# Patient Record
Sex: Female | Born: 1935 | Race: White | Hispanic: No | Smoking: Never smoker
Health system: Southern US, Community
[De-identification: ages and names within clinical notes are randomized; demographics above are authoritative.]

## PROBLEM LIST (undated history)

## (undated) DIAGNOSIS — E782 Mixed hyperlipidemia: Secondary | ICD-10-CM

## (undated) DIAGNOSIS — S069XAA Unspecified intracranial injury with loss of consciousness status unknown, initial encounter: Secondary | ICD-10-CM

## (undated) DIAGNOSIS — F03C0 Unspecified dementia, severe, without behavioral disturbance, psychotic disturbance, mood disturbance, and anxiety: Secondary | ICD-10-CM

## (undated) DIAGNOSIS — M549 Dorsalgia, unspecified: Secondary | ICD-10-CM

## (undated) DIAGNOSIS — Z960 Presence of urogenital implants: Secondary | ICD-10-CM

## (undated) DIAGNOSIS — M797 Fibromyalgia: Secondary | ICD-10-CM

## (undated) DIAGNOSIS — K91840 Postprocedural hemorrhage and hematoma of a digestive system organ or structure following a digestive system procedure: Secondary | ICD-10-CM

## (undated) DIAGNOSIS — I10 Essential (primary) hypertension: Secondary | ICD-10-CM

## (undated) DIAGNOSIS — N200 Calculus of kidney: Secondary | ICD-10-CM

## (undated) DIAGNOSIS — I4821 Permanent atrial fibrillation: Secondary | ICD-10-CM

## (undated) DIAGNOSIS — J45909 Unspecified asthma, uncomplicated: Secondary | ICD-10-CM

## (undated) DIAGNOSIS — G8929 Other chronic pain: Secondary | ICD-10-CM

## (undated) DIAGNOSIS — K449 Diaphragmatic hernia without obstruction or gangrene: Secondary | ICD-10-CM

## (undated) DIAGNOSIS — R197 Diarrhea, unspecified: Secondary | ICD-10-CM

## (undated) DIAGNOSIS — G4733 Obstructive sleep apnea (adult) (pediatric): Secondary | ICD-10-CM

## (undated) DIAGNOSIS — K219 Gastro-esophageal reflux disease without esophagitis: Secondary | ICD-10-CM

## (undated) DIAGNOSIS — E119 Type 2 diabetes mellitus without complications: Secondary | ICD-10-CM

## (undated) DIAGNOSIS — F419 Anxiety disorder, unspecified: Secondary | ICD-10-CM

## (undated) DIAGNOSIS — K439 Ventral hernia without obstruction or gangrene: Secondary | ICD-10-CM

## (undated) HISTORY — DX: Anxiety disorder, unspecified: F41.9

## (undated) HISTORY — DX: Gastro-esophageal reflux disease without esophagitis: K21.9

## (undated) HISTORY — PX: COLECTOMY: SHX59

## (undated) HISTORY — DX: Type 2 diabetes mellitus without complications: E11.9

## (undated) HISTORY — DX: Other chronic pain: G89.29

## (undated) HISTORY — DX: Diaphragmatic hernia without obstruction or gangrene: K44.9

## (undated) HISTORY — PX: TOTAL ABDOMINAL HYSTERECTOMY: SHX209

## (undated) HISTORY — DX: Presence of urogenital implants: Z96.0

## (undated) HISTORY — DX: Dorsalgia, unspecified: M54.9

## (undated) HISTORY — DX: Essential (primary) hypertension: I10

## (undated) HISTORY — DX: Mixed hyperlipidemia: E78.2

## (undated) HISTORY — DX: Fibromyalgia: M79.7

## (undated) HISTORY — DX: Diarrhea, unspecified: R19.7

## (undated) HISTORY — DX: Calculus of kidney: N20.0

## (undated) HISTORY — PX: TONSILLECTOMY: SUR1361

## (undated) HISTORY — DX: Obstructive sleep apnea (adult) (pediatric): G47.33

## (undated) HISTORY — DX: Postprocedural hemorrhage of a digestive system organ or structure following a digestive system procedure: K91.840

## (undated) HISTORY — DX: Permanent atrial fibrillation: I48.21

## (undated) HISTORY — PX: BREAST LUMPECTOMY: SHX2

---

## 2001-12-25 ENCOUNTER — Inpatient Hospital Stay (HOSPITAL_COMMUNITY): Admission: EM | Admit: 2001-12-25 | Discharge: 2001-12-30 | Payer: Self-pay | Admitting: Cardiology

## 2001-12-30 ENCOUNTER — Encounter: Payer: Self-pay | Admitting: Cardiology

## 2003-09-30 ENCOUNTER — Ambulatory Visit (HOSPITAL_COMMUNITY): Admission: RE | Admit: 2003-09-30 | Discharge: 2003-09-30 | Payer: Self-pay | Admitting: Internal Medicine

## 2003-10-12 ENCOUNTER — Inpatient Hospital Stay (HOSPITAL_COMMUNITY): Admission: RE | Admit: 2003-10-12 | Discharge: 2003-10-17 | Payer: Self-pay | Admitting: Internal Medicine

## 2004-03-08 ENCOUNTER — Ambulatory Visit (HOSPITAL_COMMUNITY): Admission: RE | Admit: 2004-03-08 | Discharge: 2004-03-08 | Payer: Self-pay | Admitting: Internal Medicine

## 2004-04-06 ENCOUNTER — Ambulatory Visit (HOSPITAL_COMMUNITY): Admission: RE | Admit: 2004-04-06 | Discharge: 2004-04-06 | Payer: Self-pay | Admitting: General Surgery

## 2004-06-01 ENCOUNTER — Ambulatory Visit: Payer: Self-pay | Admitting: *Deleted

## 2004-06-01 ENCOUNTER — Inpatient Hospital Stay (HOSPITAL_COMMUNITY): Admission: EM | Admit: 2004-06-01 | Discharge: 2004-06-04 | Payer: Self-pay | Admitting: Internal Medicine

## 2004-06-02 ENCOUNTER — Ambulatory Visit: Payer: Self-pay | Admitting: Cardiology

## 2004-06-03 ENCOUNTER — Encounter: Payer: Self-pay | Admitting: Internal Medicine

## 2004-06-18 ENCOUNTER — Ambulatory Visit: Payer: Self-pay | Admitting: *Deleted

## 2004-06-20 ENCOUNTER — Ambulatory Visit (HOSPITAL_COMMUNITY): Admission: RE | Admit: 2004-06-20 | Discharge: 2004-06-21 | Payer: Self-pay | Admitting: *Deleted

## 2004-06-20 ENCOUNTER — Ambulatory Visit: Payer: Self-pay | Admitting: Cardiovascular Disease

## 2004-06-22 ENCOUNTER — Ambulatory Visit: Payer: Self-pay | Admitting: Cardiology

## 2004-06-28 ENCOUNTER — Ambulatory Visit: Payer: Self-pay | Admitting: *Deleted

## 2004-12-26 ENCOUNTER — Ambulatory Visit (HOSPITAL_COMMUNITY): Admission: RE | Admit: 2004-12-26 | Discharge: 2004-12-26 | Payer: Self-pay | Admitting: Family Medicine

## 2005-04-19 ENCOUNTER — Ambulatory Visit (HOSPITAL_COMMUNITY): Admission: RE | Admit: 2005-04-19 | Discharge: 2005-04-19 | Payer: Self-pay | Admitting: Family Medicine

## 2005-10-08 ENCOUNTER — Ambulatory Visit (HOSPITAL_COMMUNITY): Admission: RE | Admit: 2005-10-08 | Discharge: 2005-10-08 | Payer: Self-pay | Admitting: Family Medicine

## 2005-12-16 ENCOUNTER — Ambulatory Visit (HOSPITAL_COMMUNITY): Admission: RE | Admit: 2005-12-16 | Discharge: 2005-12-16 | Payer: Self-pay | Admitting: Rheumatology

## 2005-12-29 ENCOUNTER — Encounter: Admission: RE | Admit: 2005-12-29 | Discharge: 2005-12-29 | Payer: Self-pay | Admitting: Rheumatology

## 2006-02-10 ENCOUNTER — Ambulatory Visit (HOSPITAL_COMMUNITY): Admission: RE | Admit: 2006-02-10 | Discharge: 2006-02-10 | Payer: Self-pay | Admitting: Family Medicine

## 2006-08-18 ENCOUNTER — Ambulatory Visit: Payer: Self-pay | Admitting: Cardiology

## 2006-09-29 ENCOUNTER — Ambulatory Visit: Payer: Self-pay | Admitting: Physician Assistant

## 2006-10-26 ENCOUNTER — Ambulatory Visit: Payer: Self-pay | Admitting: Cardiology

## 2006-10-27 ENCOUNTER — Encounter: Payer: Self-pay | Admitting: Cardiology

## 2006-11-14 ENCOUNTER — Ambulatory Visit: Payer: Self-pay | Admitting: Cardiology

## 2007-03-31 ENCOUNTER — Inpatient Hospital Stay (HOSPITAL_COMMUNITY): Admission: AD | Admit: 2007-03-31 | Discharge: 2007-04-01 | Payer: Self-pay | Admitting: Internal Medicine

## 2007-03-31 ENCOUNTER — Ambulatory Visit: Payer: Self-pay | Admitting: Internal Medicine

## 2007-04-12 ENCOUNTER — Encounter: Payer: Self-pay | Admitting: Cardiology

## 2007-04-20 ENCOUNTER — Ambulatory Visit: Payer: Self-pay | Admitting: Cardiology

## 2007-04-23 ENCOUNTER — Encounter: Payer: Self-pay | Admitting: Cardiology

## 2007-04-23 ENCOUNTER — Ambulatory Visit (HOSPITAL_COMMUNITY): Admission: RE | Admit: 2007-04-23 | Discharge: 2007-04-23 | Payer: Self-pay | Admitting: Family Medicine

## 2007-08-06 ENCOUNTER — Ambulatory Visit (HOSPITAL_COMMUNITY): Admission: RE | Admit: 2007-08-06 | Discharge: 2007-08-06 | Payer: Self-pay | Admitting: Family Medicine

## 2007-11-09 ENCOUNTER — Ambulatory Visit: Admission: RE | Admit: 2007-11-09 | Discharge: 2007-11-09 | Payer: Self-pay | Admitting: Neurology

## 2007-11-10 ENCOUNTER — Emergency Department (HOSPITAL_COMMUNITY): Admission: EM | Admit: 2007-11-10 | Discharge: 2007-11-10 | Payer: Self-pay | Admitting: Emergency Medicine

## 2008-04-26 ENCOUNTER — Encounter: Payer: Self-pay | Admitting: Cardiology

## 2008-06-30 ENCOUNTER — Encounter: Payer: Self-pay | Admitting: Cardiology

## 2008-06-30 ENCOUNTER — Ambulatory Visit: Payer: Self-pay | Admitting: Cardiology

## 2008-09-22 ENCOUNTER — Encounter: Admission: RE | Admit: 2008-09-22 | Discharge: 2008-09-22 | Payer: Self-pay | Admitting: Family Medicine

## 2008-12-21 DIAGNOSIS — E782 Mixed hyperlipidemia: Secondary | ICD-10-CM | POA: Insufficient documentation

## 2008-12-21 DIAGNOSIS — R0602 Shortness of breath: Secondary | ICD-10-CM | POA: Insufficient documentation

## 2008-12-21 DIAGNOSIS — R002 Palpitations: Secondary | ICD-10-CM | POA: Insufficient documentation

## 2008-12-21 DIAGNOSIS — I10 Essential (primary) hypertension: Secondary | ICD-10-CM | POA: Insufficient documentation

## 2008-12-21 DIAGNOSIS — R079 Chest pain, unspecified: Secondary | ICD-10-CM | POA: Insufficient documentation

## 2008-12-26 ENCOUNTER — Telehealth (INDEPENDENT_AMBULATORY_CARE_PROVIDER_SITE_OTHER): Payer: Self-pay | Admitting: *Deleted

## 2009-02-16 DIAGNOSIS — Z8719 Personal history of other diseases of the digestive system: Secondary | ICD-10-CM | POA: Insufficient documentation

## 2009-02-17 ENCOUNTER — Ambulatory Visit: Payer: Self-pay | Admitting: Internal Medicine

## 2009-02-17 DIAGNOSIS — K219 Gastro-esophageal reflux disease without esophagitis: Secondary | ICD-10-CM | POA: Insufficient documentation

## 2009-02-17 DIAGNOSIS — E1122 Type 2 diabetes mellitus with diabetic chronic kidney disease: Secondary | ICD-10-CM | POA: Insufficient documentation

## 2009-02-17 DIAGNOSIS — N200 Calculus of kidney: Secondary | ICD-10-CM | POA: Insufficient documentation

## 2009-02-17 DIAGNOSIS — IMO0001 Reserved for inherently not codable concepts without codable children: Secondary | ICD-10-CM | POA: Insufficient documentation

## 2009-02-17 DIAGNOSIS — M549 Dorsalgia, unspecified: Secondary | ICD-10-CM | POA: Insufficient documentation

## 2009-02-17 DIAGNOSIS — G4730 Sleep apnea, unspecified: Secondary | ICD-10-CM | POA: Insufficient documentation

## 2009-02-17 DIAGNOSIS — F411 Generalized anxiety disorder: Secondary | ICD-10-CM | POA: Insufficient documentation

## 2009-02-17 DIAGNOSIS — N183 Chronic kidney disease, stage 3 (moderate): Secondary | ICD-10-CM

## 2009-02-17 DIAGNOSIS — F419 Anxiety disorder, unspecified: Secondary | ICD-10-CM | POA: Insufficient documentation

## 2009-02-17 DIAGNOSIS — R197 Diarrhea, unspecified: Secondary | ICD-10-CM | POA: Insufficient documentation

## 2009-02-22 HISTORY — PX: COLONOSCOPY: SHX174

## 2009-02-22 HISTORY — PX: ESOPHAGOGASTRODUODENOSCOPY: SHX1529

## 2009-02-23 ENCOUNTER — Ambulatory Visit: Payer: Self-pay | Admitting: Internal Medicine

## 2009-02-23 ENCOUNTER — Ambulatory Visit (HOSPITAL_COMMUNITY): Admission: RE | Admit: 2009-02-23 | Discharge: 2009-02-23 | Payer: Self-pay | Admitting: Internal Medicine

## 2009-02-27 ENCOUNTER — Encounter: Payer: Self-pay | Admitting: Internal Medicine

## 2009-04-14 ENCOUNTER — Ambulatory Visit: Payer: Self-pay | Admitting: Gastroenterology

## 2009-04-14 DIAGNOSIS — K589 Irritable bowel syndrome without diarrhea: Secondary | ICD-10-CM | POA: Insufficient documentation

## 2009-04-14 DIAGNOSIS — R1084 Generalized abdominal pain: Secondary | ICD-10-CM | POA: Insufficient documentation

## 2009-04-28 ENCOUNTER — Telehealth (INDEPENDENT_AMBULATORY_CARE_PROVIDER_SITE_OTHER): Payer: Self-pay | Admitting: *Deleted

## 2009-05-17 ENCOUNTER — Ambulatory Visit: Payer: Self-pay | Admitting: Internal Medicine

## 2009-06-05 ENCOUNTER — Ambulatory Visit (HOSPITAL_COMMUNITY): Admission: RE | Admit: 2009-06-05 | Discharge: 2009-06-05 | Payer: Self-pay | Admitting: Family Medicine

## 2009-06-28 ENCOUNTER — Telehealth (INDEPENDENT_AMBULATORY_CARE_PROVIDER_SITE_OTHER): Payer: Self-pay | Admitting: *Deleted

## 2009-06-28 ENCOUNTER — Encounter: Payer: Self-pay | Admitting: Cardiology

## 2009-07-11 ENCOUNTER — Ambulatory Visit: Payer: Self-pay | Admitting: Cardiology

## 2009-07-11 ENCOUNTER — Inpatient Hospital Stay (HOSPITAL_COMMUNITY): Admission: EM | Admit: 2009-07-11 | Discharge: 2009-07-13 | Payer: Self-pay | Admitting: Emergency Medicine

## 2009-07-11 ENCOUNTER — Encounter: Payer: Self-pay | Admitting: Cardiology

## 2009-07-12 ENCOUNTER — Encounter: Payer: Self-pay | Admitting: Cardiology

## 2009-07-12 ENCOUNTER — Ambulatory Visit: Payer: Self-pay | Admitting: Cardiology

## 2009-07-13 ENCOUNTER — Encounter: Payer: Self-pay | Admitting: Cardiology

## 2009-08-04 ENCOUNTER — Ambulatory Visit: Payer: Self-pay | Admitting: Cardiology

## 2009-08-04 DIAGNOSIS — N183 Chronic kidney disease, stage 3 unspecified: Secondary | ICD-10-CM | POA: Insufficient documentation

## 2009-08-04 DIAGNOSIS — I4821 Permanent atrial fibrillation: Secondary | ICD-10-CM | POA: Insufficient documentation

## 2009-08-04 DIAGNOSIS — I2510 Atherosclerotic heart disease of native coronary artery without angina pectoris: Secondary | ICD-10-CM | POA: Insufficient documentation

## 2009-08-04 DIAGNOSIS — E119 Type 2 diabetes mellitus without complications: Secondary | ICD-10-CM | POA: Insufficient documentation

## 2009-08-04 DIAGNOSIS — N1330 Unspecified hydronephrosis: Secondary | ICD-10-CM | POA: Insufficient documentation

## 2009-08-04 DIAGNOSIS — D508 Other iron deficiency anemias: Secondary | ICD-10-CM | POA: Insufficient documentation

## 2009-08-11 ENCOUNTER — Ambulatory Visit: Payer: Self-pay | Admitting: Cardiology

## 2009-08-14 ENCOUNTER — Encounter: Payer: Self-pay | Admitting: Cardiology

## 2009-08-24 ENCOUNTER — Encounter (INDEPENDENT_AMBULATORY_CARE_PROVIDER_SITE_OTHER): Payer: Self-pay | Admitting: *Deleted

## 2009-08-29 ENCOUNTER — Ambulatory Visit (HOSPITAL_COMMUNITY): Admission: RE | Admit: 2009-08-29 | Discharge: 2009-08-29 | Payer: Self-pay | Admitting: Urology

## 2009-09-07 ENCOUNTER — Encounter (INDEPENDENT_AMBULATORY_CARE_PROVIDER_SITE_OTHER): Payer: Self-pay | Admitting: *Deleted

## 2009-09-11 ENCOUNTER — Encounter (HOSPITAL_COMMUNITY): Admission: RE | Admit: 2009-09-11 | Discharge: 2009-10-11 | Payer: Self-pay | Admitting: Urology

## 2009-09-11 ENCOUNTER — Encounter (INDEPENDENT_AMBULATORY_CARE_PROVIDER_SITE_OTHER): Payer: Self-pay | Admitting: *Deleted

## 2009-09-21 ENCOUNTER — Encounter (INDEPENDENT_AMBULATORY_CARE_PROVIDER_SITE_OTHER): Payer: Self-pay | Admitting: *Deleted

## 2009-11-09 ENCOUNTER — Ambulatory Visit: Payer: Self-pay | Admitting: Cardiology

## 2009-11-16 ENCOUNTER — Encounter: Payer: Self-pay | Admitting: Cardiology

## 2009-11-16 ENCOUNTER — Ambulatory Visit: Payer: Self-pay | Admitting: Cardiology

## 2009-11-22 ENCOUNTER — Encounter: Payer: Self-pay | Admitting: Cardiology

## 2009-11-22 DIAGNOSIS — R9389 Abnormal findings on diagnostic imaging of other specified body structures: Secondary | ICD-10-CM | POA: Insufficient documentation

## 2009-11-24 ENCOUNTER — Encounter: Payer: Self-pay | Admitting: Cardiology

## 2009-11-27 ENCOUNTER — Ambulatory Visit: Payer: Self-pay | Admitting: Cardiology

## 2010-01-12 ENCOUNTER — Ambulatory Visit: Payer: Self-pay | Admitting: Cardiology

## 2010-01-12 DIAGNOSIS — R310 Gross hematuria: Secondary | ICD-10-CM | POA: Insufficient documentation

## 2010-01-23 ENCOUNTER — Encounter: Payer: Self-pay | Admitting: Cardiology

## 2010-01-23 DIAGNOSIS — E875 Hyperkalemia: Secondary | ICD-10-CM | POA: Insufficient documentation

## 2010-01-29 ENCOUNTER — Encounter: Payer: Self-pay | Admitting: Cardiology

## 2010-02-07 ENCOUNTER — Encounter (INDEPENDENT_AMBULATORY_CARE_PROVIDER_SITE_OTHER): Payer: Self-pay | Admitting: *Deleted

## 2010-02-14 ENCOUNTER — Ambulatory Visit: Payer: Self-pay | Admitting: Cardiology

## 2010-02-20 ENCOUNTER — Encounter: Payer: Self-pay | Admitting: Cardiology

## 2010-03-20 ENCOUNTER — Ambulatory Visit: Payer: Self-pay | Admitting: Cardiology

## 2010-03-27 ENCOUNTER — Ambulatory Visit: Payer: Self-pay | Admitting: Cardiology

## 2010-04-03 ENCOUNTER — Ambulatory Visit: Payer: Self-pay | Admitting: Cardiology

## 2010-04-03 LAB — CONVERTED CEMR LAB: POC INR: 3.9

## 2010-04-13 ENCOUNTER — Ambulatory Visit: Admission: RE | Admit: 2010-04-13 | Discharge: 2010-04-13 | Payer: Self-pay | Source: Home / Self Care

## 2010-04-13 LAB — CONVERTED CEMR LAB: POC INR: 2.3

## 2010-04-19 ENCOUNTER — Ambulatory Visit: Admission: RE | Admit: 2010-04-19 | Discharge: 2010-04-19 | Payer: Self-pay | Source: Home / Self Care

## 2010-04-19 LAB — CONVERTED CEMR LAB: POC INR: 2.7

## 2010-04-26 ENCOUNTER — Encounter: Payer: Self-pay | Admitting: Cardiology

## 2010-04-26 ENCOUNTER — Encounter (INDEPENDENT_AMBULATORY_CARE_PROVIDER_SITE_OTHER): Payer: Self-pay | Admitting: *Deleted

## 2010-04-27 ENCOUNTER — Ambulatory Visit: Admission: RE | Admit: 2010-04-27 | Discharge: 2010-04-27 | Payer: Self-pay | Source: Home / Self Care

## 2010-04-27 LAB — CONVERTED CEMR LAB: POC INR: 3.7

## 2010-04-29 ENCOUNTER — Encounter: Payer: Self-pay | Admitting: Rheumatology

## 2010-04-29 ENCOUNTER — Encounter: Payer: Self-pay | Admitting: Family Medicine

## 2010-04-30 ENCOUNTER — Encounter: Payer: Self-pay | Admitting: Family Medicine

## 2010-05-04 ENCOUNTER — Encounter: Payer: Self-pay | Admitting: Cardiology

## 2010-05-04 ENCOUNTER — Telehealth (INDEPENDENT_AMBULATORY_CARE_PROVIDER_SITE_OTHER): Payer: Self-pay | Admitting: *Deleted

## 2010-05-08 ENCOUNTER — Ambulatory Visit: Admission: RE | Admit: 2010-05-08 | Discharge: 2010-05-08 | Payer: Self-pay | Source: Home / Self Care

## 2010-05-08 LAB — CONVERTED CEMR LAB: POC INR: 1.8

## 2010-05-08 NOTE — Assessment & Plan Note (Signed)
Summary: 3 mo fu per july reminder-srs   Visit Type:  Follow-up Referring Clairessa Boulet:  Dr Milinda Cave Primary Rece Zechman:  Dr. Earley Favor   History of Present Illness: the patient is a 76 year old female with history of substernal chest pain and palpitations.the patient is in persistent atrial fibrillation with symptomatic palpitations. Several months ago she was hospitalized with chest pain and ruled out for microinfarction. She has known obstructive coronary artery disease by cardiac catheterization in 2006. She had a negative Myoview in 2008. The patient has microcytic anemia, stage III kidney disease and right hydronephrosis. She scheduled for surgery on August 19 at [facility].  The patient states it still is occasional skipped beats. When this occurs she feels very weak. She has decreased exercise tolerance but is grossly overweight and deconditioned. She states that her legs feel like butter. She denies difficulty walking the mailbox which gives her out completely. She denies however exertional chest pain. She reports lower extremity edema. She typically has palpitations at 4:00 in the morning at which point she takes an additional half of the metoprolol which improves her symptoms.  Preventive Screening-Counseling & Management  Alcohol-Tobacco     Smoking Status: never  Current Medications (verified): 1)  Catapres-Tts-3 0.3 Mg/24hr Ptwk (Clonidine Hcl) .... Aone Patch Weekly 2)  Nexium 40 Mg Cpdr (Esomeprazole Magnesium) .... Take 1 Tablet By Mouth Two Times A Day 3)  Ramipril 10 Mg Caps (Ramipril) .... Take 1 Tablet By Mouth Once Daily 4)  Norvasc 10 Mg Tabs (Amlodipine Besylate) .... Take 1 Tablet By Mouth Once A Day 5)  Metoprolol Tartrate 50 Mg Tabs (Metoprolol Tartrate) .... Take 1 Tablet By Mouth Two Times A Day, (May Take Up To 1 Extra Tablet Two Times A Day As Needed) 6)  Crestor 10 Mg Tabs (Rosuvastatin Calcium) .... Take 1 Tablet By Mouth Once A Day 7)  Hydrochlorothiazide 50 Mg  Tabs (Hydrochlorothiazide) .... Take 1 Tablet By Mouth Once A Day 8)  Allegra 180 Mg Tabs (Fexofenadine Hcl) .... Take 1 Tablet By Mouth Once A Day As Needed 9)  Humalog 100 Unit/ml Soln (Insulin Lispro (Human)) .... Inject Subcutaneously As Directed 10)  Lantus 100 Unit/ml Soln (Insulin Glargine) .... Inject 60 Units Two Times A Day Subcutaneously 11)  Oxycodone Hcl 15 Mg Tabs (Oxycodone Hcl) .... Take 1 Tablet By Mouth Four Times A Day 12)  Xanax 1 Mg Tabs (Alprazolam) .... Take 1 Tablet By Mouth Three Times A Day 13)  Tylenol .... As Needed 14)  Ambien 10 Mg Tabs (Zolpidem Tartrate) .... Take 1 Tablet By Mouth At Bedtime As Needed Sleep 15)  Multivitamins  Tabs (Multiple Vitamin) .... Take 1 Tablet By Mouth Once A Day 16)  Warfarin Sodium 5 Mg Tabs (Warfarin Sodium) .... Use As Directed By Dr. Milinda Cave 17)  Zanaflex 2 Mg Caps (Tizanidine Hcl) .... Take 1 Capsule By Mouth Three Times A Day 18)  Lasix 40 Mg Tabs (Furosemide) .... Take 1 Tablet By Mouth Once A Day As Needed Extreme Swelling of Legs 19)  Metoprolol Tartrate 25 Mg Tabs (Metoprolol Tartrate) .... May Use Up To Two Times A Day As Needed Palpitations 20)  Premarin 0.625 Mg/gm Crea (Estrogens, Conjugated) .... Use As Directed 2 Grams Vaginally Each Day 21)  Tobrasol 0.3 % Soln (Tobramycin Sulfate) .... Instill One Drop Three Times A Day in (L) Eye 22)  Dulcolax 5 Mg Tbec (Bisacodyl) .... As Needed 23)  Lomotil 2.5-0.025 Mg Tabs (Diphenoxylate-Atropine) .... As Needed  Allergies: 1)  !  Pcn 2)  ! * Vesteril 3)  ! * Ivp Dye  Comments:  Nurse/Medical Assistant: The patient's medications were reviewed with the patient and were updated in the Medication List. Pt brought medication bottles to office visit.  Cyril Loosen, RN, BSN (November 09, 2009 11:12 AM)  Past History:  Past Medical History: Last updated: 04/14/2009 WEIGHT LOSS (ICD-783.21) DIARRHEA, CHRONIC (ICD-787.91) GERD (ICD-530.81) IDDM (ICD-250.01) SLEEP APNEA  (ICD-780.57) FIBROMYALGIA (ICD-729.1) NEPHROLITHIASIS (ICD-592.0) seeing Dr Jerre Simon ANXIETY (ICD-300.00) DIVERTICULITIS, HX OF (ICD-V12.79) PALPITATIONS (ICD-785.1) HYPERLIPIDEMIA-MIXED (ICD-272.4) HYPERTENSION, UNSPECIFIED (ICD-401.9) CHEST PAIN-UNSPECIFIED (ICD-786.50) DYSPNEA (ICD-786.05) Hx incomplete colonoscopy by Dr. Jena Gauss 06/11/1999 due to fixed non-compliant colon precluded exam to 40 cm, she had subsequent colectomy for diverticulitis since that time CHRONIC BACK PAIN  Recent EGD/TCS 11/10 showed small hiatal hernia,  normal-appearing tubular esophagus status post passage of a 54-French Maloney dilator, status post biopsy esophageal mucosa, multiple fundal gland type gastric polyps.  One large pedunculated polyp with oozing noted status post clipping and snare polypectomy.  Biopsies of the gastric mucosa taken given the her symptoms in her elevated eosinophil count.  Normal D1-D3, status post biopsy D2-D3.  All bx unremarkable.  On TCS she had left-sided diverticula, evidence of prior segmental resection with anastomosis, multiple colonic polyps status post multiple snare polypectomies, status post segmental biopsy and stool sampling.  She had multiple tubular adenomas.  No microscopic/collagenous colitis.  Stool culture, C Diff, O+P, lactoferrin were negative.  Past Surgical History: Last updated: 02/17/2009 Abdominal Hysterectomy-Total Tonsillectomy Colectomy 2002 Dr. Salvadore Dom for diverticulitis  Family History: Last updated: 02/17/2009 Family History of Coronary Artery Disease:  No known family history of colorectal carcinoma, IBD, or liver problems. Father: (deceased 69) DM, MI, CAD, HTN Mother: (deceased 4)   DM, MI, CAD Siblings: 7 (1/2 siblings) living CAD, cerebral aneurysm, HTN 1 son chronic pancreatitis, depression  Social History: Last updated: 02/17/2009 Retired, Contractor Widowed, lives w/ 2 sons 4 living children, lost 1 son age 58 MVA Tobacco Use - No.   Alcohol Use - no Regular Exercise - no Drug Use - no  Risk Factors: Smoking Status: never (11/09/2009)  Review of Systems       The patient complains of fatigue, weight gain/loss, palpitations, shortness of breath, sleep apnea, and leg swelling.  The patient denies malaise, fever, vision loss, decreased hearing, hoarseness, chest pain, prolonged cough, wheezing, coughing up blood, abdominal pain, blood in stool, nausea, vomiting, diarrhea, heartburn, incontinence, blood in urine, muscle weakness, joint pain, rash, skin lesions, headache, fainting, dizziness, depression, anxiety, enlarged lymph nodes, easy bruising or bleeding, and environmental allergies.    Vital Signs:  Patient profile:   76 year old female Height:      63 inches Weight:      238 pounds BMI:     42.31 Pulse rate:   66 / minute BP sitting:   103 / 65  (left arm) Cuff size:   large  Vitals Entered By: Cyril Loosen, RN, BSN (November 09, 2009 11:00 AM)  Nutrition Counseling: Patient's BMI is greater than 25 and therefore counseled on weight management options. Comments Follow up visit   Physical Exam  Additional Exam:  General: Well-developed, well-nourished in no distress head: Normocephalic and atraumatic eyes PERRLA/EOMI intact, conjunctiva and lids normal nose: No deformity or lesions mouth normal dentition, normal posterior pharynx neck: Supple, no JVD.  No masses, thyromegaly or abnormal cervical nodes lungs: Normal breath sounds bilaterally without wheezing.  Normal percussion heart: regular rate and rhythm  with normal S1 and S2, no S3 or S4.  PMI is normal.  No pathological murmurs abdomen: Normal bowel sounds, abdomen is soft and nontender without masses, organomegaly or hernias noted.  No hepatosplenomegaly musculoskeletal: Back normal, normal gait muscle strength and tone normal pulsus: Pulse is normal in all 4 extremities Extremities: 2+ peripheral pitting edema in neurologic: Alert and oriented  x 3 skin: Intact without lesions or rashes cervical nodes: No significant adenopathy psychologic: Normal affect    Impression & Recommendations:  Problem # 1:  ATRIAL FIBRILLATION (ICD-427.31) the patient could benefit from restoration of normal sinus rhythm. However she will need an echocardiogram first. Also I would have the patient proceed with elective renal surgery prior to making a decision for cardioversion. I will let the patient come back in September after her surgery at that point in time after review of the echo we'll make a decision if she could benefit from cardioversion. Her updated medication list for this problem includes:    Metoprolol Tartrate 50 Mg Tabs (Metoprolol tartrate) .[name]... Take 1 tablet by mouth two times a day, (may take up to 1 extra tablet two times a day as needed)    Warfarin Sodium 5 Mg Tabs (Warfarin sodium) ..... Use as directed by dr. Milinda Cave    Metoprolol Tartrate 25 Mg Tabs (Metoprolol tartrate) ..... May use up to two times a day as needed palpitations  Orders: EKG w/ Interpretation (93000) 2-D Echocardiogram (2D Echo)  Problem # 2:  RENAL DISEASE, CHRONIC, STAGE III (ICD-585.3) Assessment: Comment Only  Problem # 3:  DM (ICD-250.00) Assessment: Comment Only  Her updated medication list for this problem includes:    Ramipril 10 Mg Caps (Ramipril) .[name]... Take 1 tablet by mouth once daily    Humalog 100 Unit/ml Soln (Insulin lispro (human)) ..... Inject subcutaneously as directed    Lantus 100 Unit/ml Soln (Insulin glargine) ..... Inject 60 units two times a day subcutaneously  Problem # 4:  HYDRONEPHROSIS, RIGHT (ICD-591) Assessment: Comment Only  Patient Instructions: 1)  2D Echo  2)  Follow up in

## 2010-05-08 NOTE — Letter (Signed)
Summary: Engineer, materials at [facility]  [address]   [place], [place] [address]   Phone: [phone]  Fax: [phone]        February 07, 2010 MRN: [id number]   [name] [address] [place], [place]  [address]   Dear [name],  Your test ordered by Selena Batten has been reviewed by your physician (or physician assistant) and was found to be normal or stable. Your physician (or physician assistant) felt no changes were needed at this time.  ____ Echocardiogram  ____ Cardiac Stress Test  __X__ Lab Work - done on 10/24 - all labs stable, but still microscopic blood in urine.                                  Continue follow up with Urologist as you are already doing.   ____ Peripheral vascular study of arms, legs or neck  ____ CT scan or X-ray  ____ Lung or Breathing test  ____ Other:   Thank you.   Hoover Brunette, LPN    Duane Boston, M.D., F.A.C.C. Thressa Sheller, M.D., F.A.C.C. Oneal Grout, M.D., F.A.C.C. Cheree Ditto, M.D., F.A.C.C. Daiva Nakayama, M.D., F.A.C.C. Kenney Houseman, M.D., F.A.C.C. Jeanne Ivan, PA-C

## 2010-05-08 NOTE — Op Note (Signed)
Summary: Operative Report (DR. JAVAID)  Operative Report (DR. JAVAID)   Imported By: Zachary George 11/09/2009 09:59:40  _____________________________________________________________________  External Attachment:    Type:   Image     Comment:   External Document

## 2010-05-08 NOTE — Procedures (Signed)
Summary: Holter and Event/ [facility] END OF SERVICE SUMMARY REPORT  Holter and Event/ [facility] END OF SERVICE SUMMARY REPORT   Imported By: Dorise Hiss 08/03/2009 15:51:33  _____________________________________________________________________  External Attachment:    Type:   Image     Comment:   External Document

## 2010-05-08 NOTE — Letter (Signed)
Summary: External Correspondence/ OFFICE NOTE [facility]  External Correspondence/ OFFICE NOTE [facility]   Imported By: Dorise Hiss 06/29/2009 15:46:59  _____________________________________________________________________  External Attachment:    Type:   Image     Comment:   External Document

## 2010-05-08 NOTE — Assessment & Plan Note (Signed)
Summary: needs ov w extender 4-6 weeks to f/u on her multiple GI sx   Visit Type:  Follow-up Visit Primary Care Provider:  McGowen  Chief Complaint:  F/U diarrhea.  History of Present Illness: [name] is here for f/u.  Diarrhea resolved. Now c/o constipation since colonoscopy.  Metformin since been stopped.  Wt up 7 pounds by our scales.  Per pt up 2 pounds by hers.  She c/o daily heartburn and nocturnal symptoms as well.  C/O epigastric burning when she lays down.  Trying to conserve money and has been skipping am dose at times.  Take pm dose at 7 after meal.  Dysphagia better.   Previously failed prilosec otc, prevacid.  Never tried zegerid, aciphex, dexilant.  C/O severe constipation and associated abd cramps. Goes 3-4 days without BM and then has multiple BMs for 1-2 days.  No melena, brbpr.  Tried Dulcolax every pm for several nights without BM.  Senokot caused cramping.  Tried Miralax for three days without results.  Says she cannot afford it.  Recent EGD/TCS 11/10 showed small hiatal hernia,  normal-appearing tubular esophagus status post passage of a 54-French Maloney dilator, status post biopsy esophageal mucosa, multiple fundal gland type gastric polyps.  One large pedunculated polyp with oozing noted status post clipping and snare polypectomy.  Biopsies of the gastric mucosa taken given the her symptoms in her elevated eosinophil count.  Normal D1-D3, status post biopsy D2-D3.  All bx unremarkable.  On TCS she had left-sided diverticula, evidence of prior segmental resection with anastomosis, multiple colonic polyps status post multiple snare polypectomies, status post segmental biopsy and stool sampling.  She had multiple tubular adenomas.  No microscopic/collagenous colitis.  Stool culture, C Diff, O+P, lactoferrin were negative.  Current Medications (verified): 1)  Catapres-Tts-3 0.3 Mg/24hr Ptwk (Clonidine Hcl) .... Aone Patch Weekly 2)  Nexium 40 Mg Cpdr (Esomeprazole  Magnesium) .... Take 1 Tablet By Mouth Two Times A Day 3)  Ramipril 10 Mg Caps (Ramipril) .... Take 1 Tablet By Mouth Two Times A Day 4)  Norvasc 10 Mg Tabs (Amlodipine Besylate) .... Take 1 Tablet By Mouth Once A Day 5)  Metoprolol Tartrate 50 Mg Tabs (Metoprolol Tartrate) .... One and One-Half Tablet Daily 6)  Crestor 10 Mg Tabs (Rosuvastatin Calcium) .... Take 1 Tablet By Mouth Once A Day 7)  Hydrochlorothiazide 50 Mg Tabs (Hydrochlorothiazide) .... Take 1 Tablet By Mouth Once A Day 8)  Allegra 180 Mg Tabs (Fexofenadine Hcl) .... Take 1 Tablet By Mouth Once A Day As Needed 9)  Humalog .[name].. 30 U  Three Times A Day Before Meals 10)  Lantus .[name].. 50 Units Am and Pm 11)  Oxycodone Hcl 15 Mg Tabs (Oxycodone Hcl) .... Take 1 Tablet By Mouth Four Times A Day 12)  Alprazolam 0.5 Mg Tabs (Alprazolam) .... Take 1 Tablet By Mouth Three Times A Day 13)  Tylenol .... As Needed  Allergies (verified): 1)  ! Pcn 2)  ! * Vesteril 3)  ! * Ivp Dye  Past History:  Past Medical History: WEIGHT LOSS (ICD-783.21) DIARRHEA, CHRONIC (ICD-787.91) GERD (ICD-530.81) IDDM (ICD-250.01) SLEEP APNEA (ICD-780.57) FIBROMYALGIA (ICD-729.1) NEPHROLITHIASIS (ICD-592.0) seeing Dr Jerre Simon ANXIETY (ICD-300.00) DIVERTICULITIS, HX OF (ICD-V12.79) PALPITATIONS (ICD-785.1) HYPERLIPIDEMIA-MIXED (ICD-272.4) HYPERTENSION, UNSPECIFIED (ICD-401.9) CHEST PAIN-UNSPECIFIED (ICD-786.50) DYSPNEA (ICD-786.05) Hx incomplete colonoscopy by Dr. Jena Gauss 06/11/1999 due to fixed non-compliant colon precluded exam to 40 cm, she had subsequent colectomy for diverticulitis since that time CHRONIC BACK PAIN  Recent EGD/TCS 11/10 showed small hiatal hernia,  normal-appearing tubular esophagus status post passage of a 54-French Maloney dilator, status post biopsy esophageal mucosa, multiple fundal gland type gastric polyps.  One large pedunculated polyp with oozing noted status post clipping and snare polypectomy.  Biopsies of the gastric mucosa  taken given the her symptoms in her elevated eosinophil count.  Normal D1-D3, status post biopsy D2-D3.  All bx unremarkable.  On TCS she had left-sided diverticula, evidence of prior segmental resection with anastomosis, multiple colonic polyps status post multiple snare polypectomies, status post segmental biopsy and stool sampling.  She had multiple tubular adenomas.  No microscopic/collagenous colitis.  Stool culture, C Diff, O+P, lactoferrin were negative.  Review of Systems      See HPI  Vital Signs:  Patient profile:   76 year old female Height:      63 inches Weight:      232 pounds BMI:     41.25 Temp:     97.5 degrees F oral Pulse rate:   64 / minute BP sitting:   130 / 74  (left arm) Cuff size:   large  Vitals Entered By: Cloria Spring LPN (April 14, 2009 10:22 AM)  Physical Exam  General:  Well developed, well nourished, no acute distress. Head:  Normocephalic and atraumatic. Eyes:  Sclera nonicteric. Mouth:  OP moist. Abdomen:  normal bowel sounds, obese, distended, without guarding, without rebound, no tenderness, no masses, and no hepatomegally or splenomegaly.  Generalized abd wall weakness.  Exam limited given body habitus. Extremities:  No clubbing, cyanosis, edema or deformities noted. Neurologic:  Alert and  oriented x4;  grossly normal neurologically. Skin:  Intact without significant lesions or rashes. Psych:  Alert and cooperative. Normal mood and affect.  Impression & Recommendations:  Problem # 1:  DIARRHEA, CHRONIC (ICD-787.91)  Resolved.  Suspect due to IBS now with shift towards constipation.  Also, Metformin was d/c which may be contributing to change in bms.    Orders: Est. Patient Level III (16109)  Problem # 2:  IBS (ICD-564.1)  with constipation now.  Gives long h/o IBS.  Recommend Amitiza by mouth two times a day with food.  Patient did not want to try Miralax due to expense.  She will call with PR in two weeks.  We will send in RX as  appropriate based on her response.  Add probiotic.  Orders: Est. Patient Level III (60454)  Problem # 3:  GERD (ICD-530.81)  Breakthrough symptoms with once daily dosing.  Add back am dose 30 mins before bf and 30 mins before evening meal.  Samples to supplement her expense.  Antireflux measures addressed.  Orders: Est. Patient Level III (09811)  Problem # 4:  ABDOMINAL PAIN, GENERALIZED (ICD-789.07)  Likely secondary to constipation.  If no improvement with control of constipation she will let us know.    Orders: Est. Patient Level III (91478)  Problem # 5:  NEPHROLITHIASIS (ICD-592.0)  Patient Instructions: 1)  Begin Amitiza with food twice a day.  Call in 2 weeks with progress report or sooner if needed. 2)  Daily constipation probiotic, one by mouth daily for four weeks. 3)  Nexium 30 mins before breakfast and before evening meal daily.  4)  Call in 2 weeks with progress report or for persistent abdominal pain. 5)  Please consider having CBC recheck when you see your regular doctor. 6)  The medication list was reviewed and reconciled.  All changed / newly prescribed medications were explained.  A complete medication  list was provided to the patient / caregiver.

## 2010-05-08 NOTE — Progress Notes (Signed)
Summary: NEW ONSET A-FIB  Phone Note From Other Clinic Call back at [phone]   Caller: Dr. [facility]) Call For: doctor Summary of Call: called to inform MD that patient has new onset of A-Fib in last couple of weeks and is going to start he on coumadin 5mg  today and f/u PT/INR soon. MD wanted to make cardiologist aware of this and see if she needed to be seen here sooner. appt here is on 29th of April 2011. MD Gowen free for discussion if prefer. Initial call taken by: Carlye Grippe,  June 28, 2009 12:39 PM  Follow-up for Phone Call        he would be fine to start Coumadin. April 29 visit is fine.  Additional Follow-up for Phone Call Additional follow up Details #1::        called frontstaff at [facility] office and gave info to give to MD. Additional Follow-up by: Carlye Grippe,  June 29, 2009 4:38 PM

## 2010-05-08 NOTE — Miscellaneous (Signed)
Summary: Orders Update - BMET  Clinical Lists Changes  Problems: Added new problem of HYPERKALEMIA (ICD-276.7) Orders: Added new Test order of T-Basic Metabolic Panel ([id number]) - Signed 

## 2010-05-08 NOTE — Assessment & Plan Note (Signed)
Summary: f58m  -- agh   Visit Type:  Follow-up Referring Sanika Brosious:  Dr Milinda Cave Primary Deeandra Jerry:  Dr. Earley Favor   History of Present Illness: the patient is a 76 year old female with a history of symptomatic atrial fibrillation currently normal sinus rhythm. The patient had an echocardiogram done last lungs including a TEE which showed no mass and normal LV function. The patient states that she has episodes where she feels very weak and short of breath. She feels dizzy when standing up. She did have a Myoview scan in 2008 which was negative for ischemia.  The patient has microcytic anemia, stage III kidney disease and right hydronephrosis stent placement. She has developed gross hematuriaand has been taken off Coumadin. She is supposed to be seen in [place] for possible stent removal but the patient has not been able to make it there due to the absence of finding a ride.  The patient denies any current palpitations or syncope. She's otherwise stable from a cardiovascular standpoint  Preventive Screening-Counseling & Management  Alcohol-Tobacco     Smoking Status: never  Current Medications (verified): 1)  Catapres-Tts-3 0.3 Mg/24hr Ptwk (Clonidine Hcl) .... Aone Patch Weekly 2)  Nexium 40 Mg Cpdr (Esomeprazole Magnesium) .... Take 1 Tablet By Mouth Two Times A Day 3)  Ramipril 10 Mg Caps (Ramipril) .... Take 1 Tablet By Mouth Once Daily 4)  Norvasc 10 Mg Tabs (Amlodipine Besylate) .... Take 1 Tablet By Mouth Once A Day 5)  Metoprolol Tartrate 50 Mg Tabs (Metoprolol Tartrate) .... Take 1 Tablet By Mouth Two Times A Day, (May Take Up To 1 Extra Tablet Two Times A Day As Needed) 6)  Crestor 10 Mg Tabs (Rosuvastatin Calcium) .... Take 1 Tablet By Mouth Once A Day 7)  Hydrochlorothiazide 50 Mg Tabs (Hydrochlorothiazide) .... Take 1 Tablet By Mouth Once A Day 8)  Allegra 180 Mg Tabs (Fexofenadine Hcl) .... Take 1 Tablet By Mouth Once A Day As Needed 9)  Humalog 100 Unit/ml Soln  (Insulin Lispro (Human)) .... Inject Subcutaneously As Directed 10)  Lantus 100 Unit/ml Soln (Insulin Glargine) .... Use As Directed 11)  Oxycodone Hcl 15 Mg Tabs (Oxycodone Hcl) .... Take 1 Tablet By Mouth Four Times A Day 12)  Xanax 1 Mg Tabs (Alprazolam) .... Take 1 Tablet By Mouth Three Times A Day 13)  Tylenol .... As Needed 14)  Ambien 10 Mg Tabs (Zolpidem Tartrate) .... Take 1 Tablet By Mouth At Bedtime As Needed Sleep 15)  Multivitamins  Tabs (Multiple Vitamin) .... Take 1 Tablet By Mouth Once A Day 16)  Zanaflex 2 Mg Caps (Tizanidine Hcl) .... Take 1 Capsule By Mouth Three Times A Day 17)  Lasix 40 Mg Tabs (Furosemide) .... Take 1 Tablet By Mouth Once A Day As Needed Extreme Swelling of Legs 18)  Metoprolol Tartrate 25 Mg Tabs (Metoprolol Tartrate) .... May Use Up To Two Times A Day As Needed Palpitations 19)  Premarin 0.625 Mg/gm Crea (Estrogens, Conjugated) .... Use As Directed 2 Grams Vaginally Each Day 20)  Tobrasol 0.3 % Soln (Tobramycin Sulfate) .... Instill One Drop Three Times A Day in (L) Eye 21)  Dulcolax 5 Mg Tbec (Bisacodyl) .... As Needed 22)  Lomotil 2.5-0.025 Mg Tabs (Diphenoxylate-Atropine) .... As Needed 23)  Nitrostat 0.4 Mg Subl (Nitroglycerin) .... Dissolve One Tablet Under Tongue For Severe Chest Pain As Needed Every 5 Minutes, Not To Exceed 3 in 15 Min Time Frame  Allergies: 1)  ! Pcn 2)  ! * Vesteril 3)  ! *  Ivp Dye 4)  ! Demerol  Comments:  Nurse/Medical Assistant: The patient's medication list and allergies were reviewed with the patient and were updated in the Medication and Allergy Lists.  Past History:  Past Medical History: Last updated: 04/14/2009 WEIGHT LOSS (ICD-783.21) DIARRHEA, CHRONIC (ICD-787.91) GERD (ICD-530.81) IDDM (ICD-250.01) SLEEP APNEA (ICD-780.57) FIBROMYALGIA (ICD-729.1) NEPHROLITHIASIS (ICD-592.0) seeing Dr Jerre Simon ANXIETY (ICD-300.00) DIVERTICULITIS, HX OF (ICD-V12.79) PALPITATIONS (ICD-785.1) HYPERLIPIDEMIA-MIXED  (ICD-272.4) HYPERTENSION, UNSPECIFIED (ICD-401.9) CHEST PAIN-UNSPECIFIED (ICD-786.50) DYSPNEA (ICD-786.05) Hx incomplete colonoscopy by Dr. Jena Gauss 06/11/1999 due to fixed non-compliant colon precluded exam to 40 cm, she had subsequent colectomy for diverticulitis since that time CHRONIC BACK PAIN  Recent EGD/TCS 11/10 showed small hiatal hernia,  normal-appearing tubular esophagus status post passage of a 54-French Maloney dilator, status post biopsy esophageal mucosa, multiple fundal gland type gastric polyps.  One large pedunculated polyp with oozing noted status post clipping and snare polypectomy.  Biopsies of the gastric mucosa taken given the her symptoms in her elevated eosinophil count.  Normal D1-D3, status post biopsy D2-D3.  All bx unremarkable.  On TCS she had left-sided diverticula, evidence of prior segmental resection with anastomosis, multiple colonic polyps status post multiple snare polypectomies, status post segmental biopsy and stool sampling.  She had multiple tubular adenomas.  No microscopic/collagenous colitis.  Stool culture, C Diff, O+P, lactoferrin were negative.  Past Surgical History: Last updated: 02/17/2009 Abdominal Hysterectomy-Total Tonsillectomy Colectomy 2002 Dr. Salvadore Dom for diverticulitis  Family History: Last updated: 02/17/2009 Family History of Coronary Artery Disease:  No known family history of colorectal carcinoma, IBD, or liver problems. Father: (deceased 71) DM, MI, CAD, HTN Mother: (deceased 17)   DM, MI, CAD Siblings: 7 (1/2 siblings) living CAD, cerebral aneurysm, HTN 1 son chronic pancreatitis, depression  Social History: Last updated: 02/17/2009 Retired, Contractor Widowed, lives w/ 2 sons 4 living children, lost 1 son age 47 MVA Tobacco Use - No.  Alcohol Use - no Regular Exercise - no Drug Use - no  Risk Factors: Exercise: no (12/21/2008)  Risk Factors: Smoking Status: never (01/12/2010)  Family History: Reviewed history  from 02/17/2009 and no changes required. Family History of Coronary Artery Disease:  No known family history of colorectal carcinoma, IBD, or liver problems. Father: (deceased 51) DM, MI, CAD, HTN Mother: (deceased 28)   DM, MI, CAD Siblings: 7 (1/2 siblings) living CAD, cerebral aneurysm, HTN 1 son chronic pancreatitis, depression  Social History: Reviewed history from 02/17/2009 and no changes required. Retired, Contractor Widowed, lives w/ 2 sons 4 living children, lost 1 son age 47 MVA Tobacco Use - No.  Alcohol Use - no Regular Exercise - no Drug Use - no  Review of Systems       The patient complains of shortness of breath and blood in urine.  The patient denies fatigue, malaise, fever, weight gain/loss, vision loss, decreased hearing, hoarseness, chest pain, palpitations, prolonged cough, wheezing, sleep apnea, coughing up blood, abdominal pain, blood in stool, nausea, vomiting, diarrhea, heartburn, incontinence, muscle weakness, joint pain, leg swelling, rash, skin lesions, headache, fainting, dizziness, depression, anxiety, enlarged lymph nodes, easy bruising or bleeding, and environmental allergies.    Vital Signs:  Patient profile:   76 year old female Height:      63 inches Weight:      240 pounds Pulse rate:   85 / minute BP sitting:   109 / 69  (left arm) Cuff size:   large  Vitals Entered By: Carlye Grippe (January 12, 2010 1:47 PM)  Physical Exam  Additional Exam:  General: Well-developed, well-nourished in no distress head: Normocephalic and atraumatic eyes PERRLA/EOMI intact, conjunctiva and lids normal nose: No deformity or lesions mouth normal dentition, normal posterior pharynx neck: Supple, no JVD.  No masses, thyromegaly or abnormal cervical nodes lungs: Normal breath sounds bilaterally without wheezing.  Normal percussion heart: regular rate and rhythm with normal S1 and S2, no S3 or S4.  PMI is normal.  No pathological murmurs abdomen: Normal  bowel sounds, abdomen is soft and nontender without masses, organomegaly or hernias noted.  No hepatosplenomegaly musculoskeletal: Back normal, normal gait muscle strength and tone normal pulsus: Pulse is normal in all 4 extremities Extremities: 2+ peripheral pitting edema in neurologic: Alert and oriented x 3 skin: Intact without lesions or rashes cervical nodes: No significant adenopathy psychologic: Normal affect    Impression & Recommendations:  Problem # 1:  ECHOCARDIOGRAM, ABNORMAL (ICD-793.2) the patient initial abnormal echocardiogram with possible mass. TEE however was within normal limits.  Problem # 2:  GROSS HEMATURIA (ICD-599.71) I have urged the patient's to follow up with her urologist regarding stent removal and to address the issue of gross hematuria. Has taken off Coumadin meanwhile  Problem # 3:  ATRIAL FIBRILLATION (ICD-427.31) patient is currently normal sinus with her. Continue warfarin therapy The following medications were removed from the medication list:    Warfarin Sodium 5 Mg Tabs (Warfarin sodium) ..... Use as directed by dr. Milinda Cave Her updated medication list for this problem includes:    Metoprolol Tartrate 50 Mg Tabs (Metoprolol tartrate) .[name]... Take 1 tablet by mouth two times a day, (may take up to 1 extra tablet two times a day as needed)    Metoprolol Tartrate 25 Mg Tabs (Metoprolol tartrate) ..... May use up to two times a day as needed palpitations  Orders: T-CBC w/Diff [phone]) T-Comprehensive Metabolic Panel [phone]) T-BNP  (B Natriuretic Peptide) ([id number]) T-Urinalysis ([id number]) T-PTT ([id number]) T-Protime, Auto ([id number])  Patient Instructions: 1)  Labs:  CBD w/ diff, CMET, BNP, UA Routine, PT, INR, PTT - TODAY 2)  Nitroglycerin - as needed for severe chest pain  3)  Stop Coumadin  4)  Need to follow up with Dr. Jerre Simon ASAP 5)  Follow up in  4-6 weeks Prescriptions: NITROSTAT 0.4 MG SUBL (NITROGLYCERIN)  dissolve one tablet under tongue for severe chest pain as needed every 5 minutes, not to exceed 3 in 15 min time frame  #25 x 3   Entered by:   Hoover Brunette, LPN   Authorized by:   Lewayne Bunting, MD, [facility]   Signed by:   Hoover Brunette, LPN on 95/63/8756   Method used:   Electronically to        [facility]* (retail)       [address]       [place], [place]  [address]       Ph: [phone]       Fax: [phone]   RxID:   [id number]

## 2010-05-08 NOTE — Letter (Signed)
Summary: Engineer, materials at [facility]  [address]   [place], [place] [address]   Phone: [phone]  Fax: [phone]        September 07, 2009 MRN: [id number]    CAROLYNNE SCHUCHARD [address] [place], [place]  [address]    Dear Ms. Peak,  Your test ordered by Selena Batten has been reviewed by your physician (or physician assistant) and was found to be normal or stable. Your physician (or physician assistant) felt no changes were needed at this time.  ____ Echocardiogram  ____ Cardiac Stress Test  ____ Lab Work  ____ Peripheral vascular study of arms, legs or neck  ____ CT scan or X-ray  ____ Lung or Breathing test  __X__ Other: Heart Monitor   Thank you.   Cyril Loosen, RN, BSN    Duane Boston, M.D., F.A.C.C. Thressa Sheller, M.D., F.A.C.C. Oneal Grout, M.D., F.A.C.C. Cheree Ditto, M.D., F.A.C.C. Daiva Nakayama, M.D., F.A.C.C. Kenney Houseman, M.D., F.A.C.C. Jeanne Ivan, PA-C

## 2010-05-08 NOTE — Assessment & Plan Note (Addendum)
Summary: f69m --agh   Visit Type:  Follow-up Referring Provider:  Dr Milinda Cave Primary Provider:  Viviann Spare Halm,MD   History of Present Illness: the patient is a 76 year old female with a history of symptomatic atrial fibrillation currently normal sinus rhythm. The patient had an echocardiogram done recentlhy  including a TEE which showed no mass and normal LV function.  recent blood work in 24 2011 creatinine 1.45 alkaline phosphatase138, INR 2.1 BNP 166, hemoglobin within normal limits. Microscopic hematuria. Initial  high potassium. Requested to see urologist. Patient had stent in the bladder x3 months. Early stent has been removed and blood in urine is getting lighter. She has a followup CT scan scheduled.  Bronchitis last few weeks. No more gross hematuria. Went back on coumadin.  Back in a trial fibrillation. Feels racing.  Difficulty with sugar w/ due to bronchitis.  Needs Holter monitor to see if permanent afib- may need DCCV.  Preventive Screening-Counseling & Management  Alcohol-Tobacco     Smoking Status: never  Current Medications (verified): 1)  Catapres-Tts-3 0.3 Mg/24hr Ptwk (Clonidine Hcl) .... Aone Patch Weekly 2)  Nexium 40 Mg Cpdr (Esomeprazole Magnesium) .... Take 1 Tablet By Mouth Two Times A Day 3)  Norvasc 10 Mg Tabs (Amlodipine Besylate) .... Take 1 Tablet By Mouth Once A Day 4)  Metoprolol Tartrate 50 Mg Tabs (Metoprolol Tartrate) .... Take 1 Tablet By Mouth Two Times A Day, (May Take Up To 1 Extra Tablet Two Times A Day As Needed) 5)  Crestor 10 Mg Tabs (Rosuvastatin Calcium) .... Take 1 Tablet By Mouth Once A Day 6)  Hydrochlorothiazide 50 Mg Tabs (Hydrochlorothiazide) .... Take 1 Tablet By Mouth Once A Day 7)  Allegra 180 Mg Tabs (Fexofenadine Hcl) .... Take 1 Tablet By Mouth Once A Day As Needed 8)  Humalog 100 Unit/ml Soln (Insulin Lispro (Human)) .... Inject Subcutaneously As Directed 9)  Lantus 100 Unit/ml Soln (Insulin Glargine) .... Use As Directed 10)   Oxycodone Hcl 15 Mg Tabs (Oxycodone Hcl) .... Take 1 Tablet By Mouth Four Times A Day 11)  Xanax 1 Mg Tabs (Alprazolam) .... Take 1 Tablet By Mouth Three Times A Day 12)  Tylenol .... As Needed 13)  Ambien 10 Mg Tabs (Zolpidem Tartrate) .... Take 1 Tablet By Mouth At Bedtime As Needed Sleep 14)  Multivitamins  Tabs (Multiple Vitamin) .... Take 1 Tablet By Mouth Once A Day 15)  Zanaflex 2 Mg Caps (Tizanidine Hcl) .... Take 1 Capsule By Mouth Three Times A Day 16)  Lasix 40 Mg Tabs (Furosemide) .... Take 1 Tablet By Mouth Once A Day As Needed Extreme Swelling of Legs 17)  Metoprolol Tartrate 25 Mg Tabs (Metoprolol Tartrate) .... May Use Up To Two Times A Day As Needed Palpitations 18)  Premarin 0.625 Mg/gm Crea (Estrogens, Conjugated) .... Use As Directed 2 Grams Vaginally Each Day 19)  Tobrasol 0.3 % Soln (Tobramycin Sulfate) .... Instill One Drop Three Times A Day in (L) Eye 20)  Dulcolax 5 Mg Tbec (Bisacodyl) .... As Needed 21)  Lomotil 2.5-0.025 Mg Tabs (Diphenoxylate-Atropine) .... As Needed 22)  Nitrostat 0.4 Mg Subl (Nitroglycerin) .... Dissolve One Tablet Under Tongue For Severe Chest Pain As Needed Every 5 Minutes, Not To Exceed 3 in 15 Min Time Frame 23)  Proair Hfa 108 (90 Base) Mcg/act Aers (Albuterol Sulfate) .... 2 Puffs Every 4 Hours As Needed 24)  Coumadin 10 Mg Tabs (Warfarin Sodium) .... Use As Directed Per Dr. Webb Laws Office  Allergies (verified): 1)  !  Pcn 2)  ! * Vesteril 3)  ! * Ivp Dye 4)  ! Demerol  Comments:  Nurse/Medical Assistant: The patient's medication list and allergies were reviewed with the patient and were updated in the Medication and Allergy Lists.  Past History:  Past Medical History: WEIGHT LOSS (ICD-783.21) DIARRHEA, CHRONIC (ICD-787.91) GERD (ICD-530.81) IDDM (ICD-250.01) SLEEP APNEA (ICD-780.57) FIBROMYALGIA (ICD-729.1) NEPHROLITHIASIS (ICD-592.0) seeing Dr Jerre Simon ANXIETY (ICD-300.00) DIVERTICULITIS, HX OF (ICD-V12.79) PALPITATIONS  (ICD-785.1) HYPERLIPIDEMIA-MIXED (ICD-272.4) HYPERTENSION, UNSPECIFIED (ICD-401.9) CHEST PAIN-UNSPECIFIED (ICD-786.50) DYSPNEA (ICD-786.05) Hx incomplete colonoscopy by Dr. Jena Gauss 06/11/1999 due to fixed non-compliant colon precluded exam to 40 cm, she had subsequent colectomy for diverticulitis since that time CHRONIC BACK PAIN  Recent EGD/TCS 11/10 showed small hiatal hernia,  normal-appearing tubular esophagus status post passage of a 54-French Maloney dilator, status post biopsy esophageal mucosa, multiple fundal gland type gastric polyps.  One large pedunculated polyp with oozing noted status post clipping and snare polypectomy.  Biopsies of the gastric mucosa taken given the her symptoms in her elevated eosinophil count.  Normal D1-D3, status post biopsy D2-D3.  All bx unremarkable.  On TCS she had left-sided diverticula, evidence of prior segmental resection with anastomosis, multiple colonic polyps status post multiple snare polypectomies, status post segmental biopsy and stool sampling.  She had multiple tubular adenomas.  No microscopic/collagenous colitis.  Stool culture, C Diff, O+P, lactoferrin were negative.  Ureteral stent removal gross hematuria resolved October 2011 Coumadin restarted  Review of Systems       The patient complains of fatigue, palpitations, and shortness of breath.  The patient denies malaise, fever, weight gain/loss, vision loss, decreased hearing, hoarseness, chest pain, prolonged cough, wheezing, sleep apnea, coughing up blood, abdominal pain, blood in stool, nausea, vomiting, diarrhea, heartburn, incontinence, blood in urine, muscle weakness, joint pain, leg swelling, rash, skin lesions, headache, fainting, dizziness, depression, anxiety, enlarged lymph nodes, easy bruising or bleeding, and environmental allergies.    Vital Signs:  Patient profile:   76 year old female Height:      63 inches Weight:      242 pounds Pulse rate:   98 / minute BP sitting:   135 /  83  (left arm) Cuff size:   large  Vitals Entered By: Carlye Grippe (February 14, 2010 8:05 AM)  Physical Exam  Additional Exam:  General: Well-developed, well-nourished in no distress head: Normocephalic and atraumatic eyes PERRLA/EOMI intact, conjunctiva and lids normal nose: No deformity or lesions mouth normal dentition, normal posterior pharynx neck: Supple, no JVD.  No masses, thyromegaly or abnormal cervical nodes lungs: Normal breath sounds bilaterally without wheezing.  Normal percussion heart: regular rate and rhythm with normal S1 and S2, no S3 or S4.  PMI is normal.  No pathological murmurs abdomen: Normal bowel sounds, abdomen is soft and nontender without masses, organomegaly or hernias noted.  No hepatosplenomegaly musculoskeletal: Back normal, normal gait muscle strength and tone normal pulsus: Pulse is normal in all 4 extremities Extremities: 1+ peripheral pitting edema in neurologic: Alert and oriented x 3 skin: Intact without lesions or rashes cervical nodes: No significant adenopathy psychologic: Normal affect    EKG  Procedure date:  02/14/2010  Findings:      if her blood sugar control 75 beats per minute  EKG  Procedure date:  02/14/2010  Findings:      atrial fibrillation heart rate 75 beats per minute  Impression & Recommendations:  Problem # 1:  GROSS HEMATURIA (ICD-599.71) resolved after stent removal. Patient is now back  on Coumadin.  Problem # 2:  HYPERKALEMIA (ICD-276.7) followup laboratory work showed no further hyperkalemia. Renal function slightly improved  Problem # 3:  ATRIAL FIBRILLATION (ICD-427.31)  patient appears to be in recurrent atrial fibrillation. I'm not sureif  this is permanent. She appears to be symptomatic. We will apply a 48-hour Holter monitor and decide if she needs cardioversion this will be after adequate anticoagulation Her updated medication list for this problem includes:    Metoprolol Tartrate 50 Mg Tabs  (Metoprolol tartrate) .[name]... Take 1 tablet by mouth two times a day, (may take up to 1 extra tablet two times a day as needed)    Metoprolol Tartrate 25 Mg Tabs (Metoprolol tartrate) ..... May use up to two times a day as needed palpitations    Coumadin 10 Mg Tabs (Warfarin sodium) ..... Use as directed per dr. Webb Laws office  Orders: Cardionet/Event Monitor (Cardionet/Event)  Problem # 4:  IDDM (ICD-250.01) difficult to control her delight of recent bronchitis follow up with her primary care physician Her updated medication list for this problem includes:    Humalog 100 Unit/ml Soln (Insulin lispro (human)) ..... Inject subcutaneously as directed    Lantus 100 Unit/ml Soln (Insulin glargine) ..... Use as directed  Other Orders: EKG w/ Interpretation (93000)  Patient Instructions: 1)  48 hour monitor 2)  Follow up in  6 months  Appended Document: f50m --agh this is an update on the H&P from [employer].  The patient had an event monitor done in the meanwhile. She was found to be permanent atrial fibrillation albeit rate controlled. The patient does have symptomatic palpitations. She has been anticoagulated for 4 weeks. The plan is to proceed with cardioversion.  There were no changes in the physical examination or history of this patient.

## 2010-05-08 NOTE — Progress Notes (Signed)
Summary: Progrees Report  Phone Note Call from Patient Call back at Home Phone [phone]   Caller: Patient Call For: [name] Reason for Call: Talk to Doctor Action Taken: Provider Notified Details of Complaint: Progress Report Summary of Call: Patient states the Amitiza gives her diarrhea and makes her have stomach cramps.She also said the Digestive Advantage is working really good.   Please advise on Amitiza? Initial call taken by: Ave Filter,  April 28, 2009 11:15 AM     Appended Document: Progrees Report She can decrease Amitiza to once daily or every other day if needed for constipation.  Complete Dig Advantage.  Appended Document: Progrees Report pt aware

## 2010-05-08 NOTE — Procedures (Signed)
Summary: Holter and Event/ CARDIONET  Holter and Event/ CARDIONET   Imported By: Dorise Hiss 09/05/2009 10:02:11  _____________________________________________________________________  External Attachment:    Type:   Image     Comment:   External Document  Appended Document: Holter and Event/ CARDIONET heart rate controlled no significant pauses.  Appended Document: Holter and Event/ CARDIONET Pt notified of results by letter.

## 2010-05-08 NOTE — Assessment & Plan Note (Signed)
Summary: 1 YR F/U PER PT'S CALL-JM   Visit Type:  Follow-up Referring Provider:  Dr Milinda Cave Primary Provider:  Dr. Earley Favor   History of Present Illness: the patient is a 76 year old female recently hospitalized with substernal chest pain and palpitations. The patient has multiple risk factors for coronary artery disease but was found to have no obstructive disease by cardiac catheterization in 2006. She also had a negative Myoview in 2008. Prior to her recent hospitalization the patient was found to be in atrial fibrillation by her primary care physician. She was started on Coumadin. During her most recent hospitalization she was also found to have microcytic anemia, stage III. chronic kidney disease and marked right hydronephrosis by ultrasound. In addition the patient past obstructive sleep apnea. She ruled out for myocardial infarction during her recent hospitalization.  The patient has now been referred for evaluation and followup of relative recent onset atrial fibrillation. The patient states that her symptoms have improved on beta blocker therapy but not completely abated. She has intermittent palpitations with irregular and rapid heart rate, she feels uncomfortable during this with significant shortness of breath and atypical chest pain.  The patient also reports some increase in lower showed the edema but particularly erythema of the lower extremities after she was started on Coumadin. She reports however no significant bleeding problems. She also reports no presyncope or syncope.  Preventive Screening-Counseling & Management  Alcohol-Tobacco     Smoking Status: never  Current Medications (verified): 1)  Catapres-Tts-3 0.3 Mg/24hr Ptwk (Clonidine Hcl) .... Aone Patch Weekly 2)  Nexium 40 Mg Cpdr (Esomeprazole Magnesium) .... Take 1 Tablet By Mouth Two Times A Day 3)  Ramipril 10 Mg Caps (Ramipril) .... Take 1 Tablet By Mouth Once Daily 4)  Norvasc 10 Mg Tabs (Amlodipine  Besylate) .... Take 1 Tablet By Mouth Once A Day 5)  Metoprolol Tartrate 50 Mg Tabs (Metoprolol Tartrate) .... Take 1 Tablet By Mouth Two Times A Day 6)  Crestor 10 Mg Tabs (Rosuvastatin Calcium) .... Take 1 Tablet By Mouth Once A Day 7)  Hydrochlorothiazide 50 Mg Tabs (Hydrochlorothiazide) .... Take 1 Tablet By Mouth Once A Day 8)  Allegra 180 Mg Tabs (Fexofenadine Hcl) .... Take 1 Tablet By Mouth Once A Day As Needed 9)  Humalog 100 Unit/ml Soln (Insulin Lispro (Human)) .... Inject Subcutaneously As Directed 10)  Lantus 100 Unit/ml Soln (Insulin Glargine) .... Inject 60 Units Two Times A Day Subcutaneously 11)  Oxycodone Hcl 15 Mg Tabs (Oxycodone Hcl) .... Take 1 Tablet By Mouth Four Times A Day 12)  Xanax 1 Mg Tabs (Alprazolam) .... Take 1 Tablet By Mouth Three Times A Day 13)  Tylenol .... As Needed 14)  Ambien 10 Mg Tabs (Zolpidem Tartrate) .... Take 1 Tablet By Mouth At Bedtime As Needed Sleep 15)  Multivitamins  Tabs (Multiple Vitamin) .... Take 1 Tablet By Mouth Once A Day 16)  Warfarin Sodium 5 Mg Tabs (Warfarin Sodium) .... Use As Directed By Dr. Milinda Cave 17)  Zanaflex 2 Mg Caps (Tizanidine Hcl) .... Take 1 Capsule By Mouth Three Times A Day 18)  Lasix 40 Mg Tabs (Furosemide) .... Take 1 Tablet By Mouth Once A Day As Needed Extreme Swelling of Legs 19)  Metoprolol Tartrate 25 Mg Tabs (Metoprolol Tartrate) .... May Use Up To Two Times A Day As Needed Palpitations  Allergies: 1)  ! Pcn 2)  ! * Vesteril 3)  ! * Ivp Dye  Comments:  Nurse/Medical Assistant: The patient's medications were  reviewed with the patient and were updated in the Medication List. Pt brought a list of medications to office visit.  Cyril Loosen, RN, BSN (August 04, 2009 11:13 AM)   Past History:  Past Medical History: Last updated: 04/14/2009 WEIGHT LOSS (ICD-783.21) DIARRHEA, CHRONIC (ICD-787.91) GERD (ICD-530.81) IDDM (ICD-250.01) SLEEP APNEA (ICD-780.57) FIBROMYALGIA (ICD-729.1) NEPHROLITHIASIS  (ICD-592.0) seeing Dr Jerre Simon ANXIETY (ICD-300.00) DIVERTICULITIS, HX OF (ICD-V12.79) PALPITATIONS (ICD-785.1) HYPERLIPIDEMIA-MIXED (ICD-272.4) HYPERTENSION, UNSPECIFIED (ICD-401.9) CHEST PAIN-UNSPECIFIED (ICD-786.50) DYSPNEA (ICD-786.05) Hx incomplete colonoscopy by Dr. Jena Gauss 06/11/1999 due to fixed non-compliant colon precluded exam to 40 cm, she had subsequent colectomy for diverticulitis since that time CHRONIC BACK PAIN  Recent EGD/TCS 11/10 showed small hiatal hernia,  normal-appearing tubular esophagus status post passage of a 54-French Maloney dilator, status post biopsy esophageal mucosa, multiple fundal gland type gastric polyps.  One large pedunculated polyp with oozing noted status post clipping and snare polypectomy.  Biopsies of the gastric mucosa taken given the her symptoms in her elevated eosinophil count.  Normal D1-D3, status post biopsy D2-D3.  All bx unremarkable.  On TCS she had left-sided diverticula, evidence of prior segmental resection with anastomosis, multiple colonic polyps status post multiple snare polypectomies, status post segmental biopsy and stool sampling.  She had multiple tubular adenomas.  No microscopic/collagenous colitis.  Stool culture, C Diff, O+P, lactoferrin were negative.  Past Surgical History: Last updated: 02/17/2009 Abdominal Hysterectomy-Total Tonsillectomy Colectomy 2002 Dr. Salvadore Dom for diverticulitis  Family History: Last updated: 02/17/2009 Family History of Coronary Artery Disease:  No known family history of colorectal carcinoma, IBD, or liver problems. Father: (deceased 43) DM, MI, CAD, HTN Mother: (deceased 7)   DM, MI, CAD Siblings: 7 (1/2 siblings) living CAD, cerebral aneurysm, HTN 1 son chronic pancreatitis, depression  Social History: Last updated: 02/17/2009 Retired, Contractor Widowed, lives w/ 2 sons 4 living children, lost 1 son age 64 MVA Tobacco Use - No.  Alcohol Use - no Regular Exercise - no Drug Use -  no  Risk Factors: Exercise: no (12/21/2008)  Risk Factors: Smoking Status: never (08/04/2009)  Review of Systems       The patient complains of palpitations.  The patient denies anorexia, fever, weight loss, weight gain, vision loss, decreased hearing, hoarseness, chest pain, syncope, dyspnea on exertion, peripheral edema, prolonged cough, headaches, hemoptysis, abdominal pain, melena, hematochezia, severe indigestion/heartburn, hematuria, incontinence, genital sores, muscle weakness, suspicious skin lesions, transient blindness, difficulty walking, depression, unusual weight change, abnormal bleeding, enlarged lymph nodes, angioedema, breast masses, testicular masses, fatigue, malaise, weight gain/loss, shortness of breath, wheezing, sleep apnea, coughing up blood, blood in stool, nausea, vomiting, diarrhea, heartburn, blood in urine, joint pain, leg swelling, rash, skin lesions, headache, fainting, dizziness, anxiety, easy bruising or bleeding, and environmental allergies.    Vital Signs:  Patient profile:   76 year old female Height:      63 inches Weight:      234.75 pounds BMI:     41.73 Pulse rate:   59 / minute BP sitting:   122 / 85  (left arm) Cuff size:   large  Vitals Entered By: Cyril Loosen, RN, BSN (August 04, 2009 11:04 AM)  Nutrition Counseling: Patient's BMI is greater than 25 and therefore counseled on weight management options. Comments Follow up visit   Physical Exam  Additional Exam:  General: Well-developed, well-nourished in no distress head: Normocephalic and atraumatic eyes PERRLA/EOMI intact, conjunctiva and lids normal nose: No deformity or lesions mouth normal dentition, normal posterior pharynx neck: Supple, no JVD.  No masses, thyromegaly or abnormal cervical nodes lungs: Normal breath sounds bilaterally without wheezing.  Normal percussion heart: regular rate and rhythm with normal S1 and S2, no S3 or S4.  PMI is normal.  No pathological  murmurs abdomen: Normal bowel sounds, abdomen is soft and nontender without masses, organomegaly or hernias noted.  No hepatosplenomegaly musculoskeletal: Back normal, normal gait muscle strength and tone normal pulsus: Pulse is normal in all 4 extremities Extremities: No peripheral pitting edema neurologic: Alert and oriented x 3 skin: Intact without lesions or rashes cervical nodes: No significant adenopathy psychologic: Normal affect    EKG  Procedure date:  08/04/2009  Findings:      atrial fibrillation, low voltage QRS. Nonspecific ST-T wave changes. Heart rate 82 beats per minute  Impression & Recommendations:  Problem # 1:  HYDRONEPHROSIS, RIGHT (ICD-591) follow up with her primary care physician.  Problem # 2:  ATRIAL FIBRILLATION (ICD-427.31)  a 48 hour Holter monitor will be ordered. The patient was given instructions to take 25 mg of metoprolol p.r.n. for rapid palpitations up to twice a day.the patient remains in atrial fibrillation by EKG her heart rate is controlled at 82 beats per minute Her updated medication list for this problem includes:    Metoprolol Tartrate 50 Mg Tabs (Metoprolol tartrate) .[name]... Take 1 tablet by mouth two times a day    Warfarin Sodium 5 Mg Tabs (Warfarin sodium) ..... Use as directed by dr. Milinda Cave    Metoprolol Tartrate 25 Mg Tabs (Metoprolol tartrate) ..... May use up to two times a day as needed palpitations  Orders: EKG w/ Interpretation (93000) Holter Monitor (Holter Monitor)  Problem # 3:  ANEMIA, IRON DEFICIENCY, MICROCYTIC (ICD-280.8) Assessment: Comment Only  Problem # 4:  DM (ICD-250.00)  Her updated medication list for this problem includes:    Ramipril 10 Mg Caps (Ramipril) .[name]... Take 1 tablet by mouth once daily    Humalog 100 Unit/ml Soln (Insulin lispro (human)) ..... Inject subcutaneously as directed    Lantus 100 Unit/ml Soln (Insulin glargine) ..... Inject 60 units two times a day subcutaneously  Problem # 5:   cystoscopy wasRENAL DISEASE, CHRONIC, STAGE III (ICD-585.3) Assessment: Comment Only  Patient Instructions: 1)  48 hour holter  2)  Metoprolol tart. 25mg  - may use up to two times a day as needed palpitations  3)  Follow up in  3 months  Prescriptions: METOPROLOL TARTRATE 25 MG TABS (METOPROLOL TARTRATE) may use up to two times a day as needed palpitations  #30 x 2   Entered by:   Hoover Brunette, LPN   Authorized by:   Lewayne Bunting, MD, [facility]   Signed by:   Hoover Brunette, LPN on 16/01/9603   Method used:   Electronically to        [facility]* (retail)       [address]       [place], [place]  [address]       Ph: [phone]       Fax: [phone]   RxID:   [id number]

## 2010-05-08 NOTE — Letter (Signed)
Summary: External Correspondence/ OFFICE VISIT [facility]  External Correspondence/ OFFICE VISIT [facility]   Imported By: Dorise Hiss 11/27/2009 09:10:23  _____________________________________________________________________  External Attachment:    Type:   Image     Comment:   External Document

## 2010-05-08 NOTE — Miscellaneous (Signed)
Summary: Orders Update - TEE  Clinical Lists Changes  Problems: Added new problem of ECHOCARDIOGRAM, ABNORMAL (ICD-793.2) Orders: Added new Referral order of Trans Esophageal Echocardiogram (TEE) - Signed

## 2010-05-08 NOTE — Assessment & Plan Note (Signed)
Summary: 4WK FU,MULTIPLE GI SX/GU   Visit Type:  Follow-up Visit Primary Care Provider:  [place]  Chief Complaint:  F/U Multiple GI symptoms.  History of Present Illness: [name] is here for f/u. Constipation is better on Amitiza but can only take one every other day. May go one day without stools. Intermittent abdominal cramps are much better. Denies heartburn, n/v. No further wt loss, and actually wt up one more pound. No melena, brbpr. States probiotics helped alot. Most concerned about two week h/o mid-back pain which radiates around the left flank at the level of left lower ribs.     Current Medications (verified): 1)  Catapres-Tts-3 0.3 Mg/24hr Ptwk (Clonidine Hcl) .... Aone Patch Weekly 2)  Nexium 40 Mg Cpdr (Esomeprazole Magnesium) .... Take 1 Tablet By Mouth Two Times A Day 3)  Ramipril 10 Mg Caps (Ramipril) .... Take 1 Tablet By Mouth Two Times A Day 4)  Norvasc 10 Mg Tabs (Amlodipine Besylate) .... Take 1 Tablet By Mouth Once A Day 5)  Metoprolol Tartrate 50 Mg Tabs (Metoprolol Tartrate) .... One and One-Half Tablet Daily 6)  Crestor 10 Mg Tabs (Rosuvastatin Calcium) .... Take 1 Tablet By Mouth Once A Day 7)  Hydrochlorothiazide 50 Mg Tabs (Hydrochlorothiazide) .... Take 1 Tablet By Mouth Once A Day 8)  Allegra 180 Mg Tabs (Fexofenadine Hcl) .... Take 1 Tablet By Mouth Once A Day As Needed 9)  Humalog .[name].. 30 U  Three Times A Day Before Meals 10)  Lantus .[name].. 50 Units Am and Pm 11)  Oxycodone Hcl 15 Mg Tabs (Oxycodone Hcl) .... Take 1 Tablet By Mouth Four Times A Day 12)  Alprazolam 0.5 Mg Tabs (Alprazolam) .... Take 1 Tablet By Mouth Three Times A Day 13)  Tylenol .... As Needed 14)  Amitiza 8 Mcg Caps (Lubiprostone) .... One By Mouth Every Other Day 15)  Ambien 10 Mg Tabs (Zolpidem Tartrate) .... Take 1 Tablet By Mouth Once A Day  Allergies (verified): 1)  ! Pcn 2)  ! * Vesteril 3)  ! * Ivp Dye  Review of Systems      See HPI Resp:  Denies dyspnea at rest,  dyspnea with exercise, cough, and sputum. GU:  Denies urinary burning, blood in urine, and urinary frequency. MS:  See HPI.  Vital Signs:  Patient profile:   76 year old female Weight:      233 pounds BMI:     41.42 Temp:     97.4 degrees F oral Pulse rate:   68 / minute BP sitting:   140 / 70  (left arm) Cuff size:   large  Vitals Entered By: Cloria Spring LPN (May 17, 2009 1:24 PM)  Physical Exam  General:  Well developed, well nourished, no acute distress. Head:  Normocephalic and atraumatic. Eyes:  Sclera nonicteric. Mouth:  OP moist. Lungs:  Clear throughout to auscultation. Abdomen:  normal bowel sounds, obese, distended, without guarding, without rebound, no tenderness, no masses, and no hepatomegally or splenomegaly.  Generalized abd wall weakness.  Exam limited given body habitus. Extremities:  No clubbing, cyanosis, edema or deformities noted. Neurologic:  Alert and  oriented x4;  grossly normal neurologically. Skin:  Intact without significant lesions or rashes. Psych:  Alert and cooperative. Normal mood and affect.  Impression & Recommendations:  Problem # 1:  IBS (ICD-564.1)  Stable. Four more weeks of probiotics. #30 samples provided. OV as needed.  Orders: Est. Patient Level II ([address])  Problem # 2:  GERD (  ICD-530.81)  Doing well on Nexium 40mg  two times a day. #20 samples provided.   Orders: Est. Patient Level II (16109)  Problem # 3:  BACK PAIN, CHRONIC (ICD-724.5)  Acute on chronic back pain. Advised to call Dr. Samul Dada office for appt. Patient agrees.  Orders: Est. Patient Level II (60454)

## 2010-05-08 NOTE — Procedures (Signed)
Summary: Holter and Event/ [facility]  Holter and Event/ [facility]   Imported By: Dorise Hiss 03/05/2010 10:08:57  _____________________________________________________________________  External Attachment:    Type:   Image     Comment:   External Document  Appended Document: Holter and Event/ [facility] In atrial fibrillation, DCCV after 4 weeks anticoagulation.   Appended Document: Holter and Event/ [facility] Patient notified.   Dr. Milford Cage (PMD) has been managing her coumadin.  Informed her okay to continue getting checked with them, but need to make sure they fax results to [place].  Also, need to be 4 consecutive weeks in a row with therapeutic readings.  Then can proceed with Cardioversion.  Patient verbalized understanding.

## 2010-05-10 NOTE — Medication Information (Signed)
Summary: ccr-lr  Anticoagulant Therapy  Managed by: Vashti Hey, RN PCP: Lillia Pauls Supervising MD: Diona Browner MD, Remi Deter Indication 1: Atrial Fibrillation Lab Used: [facility] [place] Site: [place] INR POC 2.7  Dietary changes: no    Health status changes: no    Bleeding/hemorrhagic complications: no    Recent/future hospitalizations: no    Any changes in medication regimen? no    Recent/future dental: no  Any missed doses?: no       Is patient compliant with meds? yes       Allergies: 1)  ! Pcn 2)  ! * Vesteril 3)  ! * Ivp Dye 4)  ! Demerol  Anticoagulation Management History:      The patient is taking warfarin and comes in today for a routine follow up visit.  Positive risk factors for bleeding include an age of 74 years or older and presence of serious comorbidities.  The bleeding index is 'intermediate risk'.  Positive CHADS2 values include History of HTN and History of Diabetes.  Negative CHADS2 values include Age > 74 years old.  Anticoagulation responsible provider: Diona Browner MD, Remi Deter.  INR POC: 2.7.  Cuvette Lot#: [id number].    Anticoagulation Management Assessment/Plan:      The target INR is 2.0-3.0.  The next INR is due 04/27/2010.  Anticoagulation instructions were given to patient.  Results were reviewed/authorized by Vashti Hey, RN.  She was notified by Vashti Hey RN.         Prior Anticoagulation Instructions: INR 2.3 Increase coumadin 5mg  once daily except 2.5mg  on S,T,Th  Current Anticoagulation Instructions: INR 2.7 Continue coumadin 5mg  once daily except 2.5mg  on S,T,Th

## 2010-05-10 NOTE — Medication Information (Signed)
Summary: ccr-lr  Anticoagulant Therapy  Managed by: Vashti Hey, RN PCP: Lillia Pauls Supervising MD: Andee Lineman MD, Michelle Piper Indication 1: Atrial Fibrillation Lab Used: [facility] [place] Site: [place] INR POC 2.3  Dietary changes: no    Health status changes: no    Bleeding/hemorrhagic complications: no    Recent/future hospitalizations: yes       Details: pending DCCV  Any changes in medication regimen? no    Recent/future dental: no  Any missed doses?: no       Is patient compliant with meds? yes       Allergies: 1)  ! Pcn 2)  ! * Vesteril 3)  ! * Ivp Dye 4)  ! Demerol  Anticoagulation Management History:      The patient is taking warfarin and comes in today for a routine follow up visit.  Positive risk factors for bleeding include an age of 39 years or older and presence of serious comorbidities.  The bleeding index is 'intermediate risk'.  Positive CHADS2 values include History of HTN and History of Diabetes.  Negative CHADS2 values include Age > 61 years old.  Anticoagulation responsible provider: Andee Lineman MD, Michelle Piper.  INR POC: 2.3.  Cuvette Lot#: [id number].    Anticoagulation Management Assessment/Plan:      The target INR is 2.0-3.0.  The next INR is due 04/19/2010.  Anticoagulation instructions were given to patient.  Results were reviewed/authorized by Vashti Hey, RN.  She was notified by Vashti Hey RN.         Prior Anticoagulation Instructions: INR 3.9 Hold coumadin tonight then decrease dose to 2.5mg  once daily except 5mg  on T,Th,Sat Info to [place] to schedule DCCV  Current Anticoagulation Instructions: INR 2.3 Increase coumadin 5mg  once daily except 2.5mg  on S,T,Th

## 2010-05-10 NOTE — Medication Information (Signed)
Summary: ccr-lr  Anticoagulant Therapy  Managed by: Vashti Hey, RN PCP: Lillia Pauls Supervising MD: Andee Lineman MD, Michelle Piper Indication 1: Atrial Fibrillation Lab Used: [facility] [place] Site: [place] INR POC 3.1  Dietary changes: no    Health status changes: no    Bleeding/hemorrhagic complications: no    Recent/future hospitalizations: yes       Details: pending DCCV  Any changes in medication regimen? no    Recent/future dental: no  Any missed doses?: no       Is patient compliant with meds? yes       Allergies: 1)  ! Pcn 2)  ! * Vesteril 3)  ! * Ivp Dye 4)  ! Demerol  Anticoagulation Management History:      The patient is taking warfarin and comes in today for a routine follow up visit.  Positive risk factors for bleeding include an age of 16 years or older and presence of serious comorbidities.  The bleeding index is 'intermediate risk'.  Positive CHADS2 values include History of HTN and History of Diabetes.  Negative CHADS2 values include Age > 44 years old.  Anticoagulation responsible Tanya Crothers: Andee Lineman MD, Michelle Piper.  INR POC: 3.1.  Cuvette Lot#: [id number].    Anticoagulation Management Assessment/Plan:      The target INR is 2.0-3.0.  The next INR is due 04/03/2010.  Anticoagulation instructions were given to patient.  Results were reviewed/authorized by Vashti Hey, RN.  She was notified by Vashti Hey RN.         Prior Anticoagulation Instructions: INR 4.0  (2nd Theraputic) Hold coumadin tonight then resume 5mg  once daily except 2.5mg  on Mondays and Wednesdays  Current Anticoagulation Instructions: INR 3.1 (3rd Theraputic) Decrease coumadin to 5mg  once daily except 2.5mg  on M,W,F

## 2010-05-10 NOTE — Progress Notes (Signed)
Summary: [facility]  Phone Note From Other Clinic   Caller: DEGENT Request: Talk with Nurse Summary of Call: CARDIOVERT WAS NOT SUCCESSFUL WANTS INR CHECK 1/31 DUE TO INR BEING 1.8 GAVE LOVANOX 1/27 INCREASED COUMADIN TO 5MG  A DAY  NEEDS TO STAY BETWEEN 2-3 NEXT FEW WEEKS Initial call taken by: Claudette Laws,  May 04, 2010 10:24 AM  Follow-up for Phone Call        Noted. Follow-up by: Vashti Hey RN,  May 04, 2010 12:59 PM

## 2010-05-10 NOTE — Medication Information (Signed)
Summary: ccr-lr  Anticoagulant Therapy  Managed by: Vashti Hey, RN PCP: Lillia Pauls Supervising MD: Andee Lineman MD, Michelle Piper Indication 1: Atrial Fibrillation Lab Used: [facility] [place] Site: [place] INR POC 3.7  Dietary changes: no    Health status changes: no    Bleeding/hemorrhagic complications: no    Recent/future hospitalizations: yes       Details: Pending DCCV 05/04/10  Any changes in medication regimen? no    Recent/future dental: no  Any missed doses?: no       Is patient compliant with meds? yes       Allergies: 1)  ! Pcn 2)  ! * Vesteril 3)  ! * Ivp Dye 4)  ! Demerol  Anticoagulation Management History:      The patient is taking warfarin and comes in today for a routine follow up visit.  Positive risk factors for bleeding include an age of 74 years or older and presence of serious comorbidities.  The bleeding index is 'intermediate risk'.  Positive CHADS2 values include History of HTN and History of Diabetes.  Negative CHADS2 values include Age > 74 years old.  Anticoagulation responsible provider: Andee Lineman MD, Michelle Piper.  INR POC: 3.7.  Cuvette Lot#: [id number].    Anticoagulation Management Assessment/Plan:      The target INR is 2.0-3.0.  The next INR is due 05/08/2010.  Anticoagulation instructions were given to patient.  Results were reviewed/authorized by Vashti Hey, RN.  She was notified by Vashti Hey RN.        Coagulation management information includes: 03/20/10  Pending DCCV on 05/04/10.  Prior Anticoagulation Instructions: INR 2.7 Continue coumadin 5mg  once daily except 2.5mg  on S,T,Th  Current Anticoagulation Instructions: INR 3.7 Hold coumadin tonight then resume 5mg  once daily except 2.5mg  on S,T,Th

## 2010-05-10 NOTE — Miscellaneous (Signed)
Summary: Orders Update - DCCV  Clinical Lists Changes  Problems: Added new problem of PRE-OPERATIVE CARDIOVASCULAR EXAMINATION (ICD-V72.81) Orders: Added new Referral order of Cardioversion (Cardioversion) - Signed Added new Test order of T-Basic Metabolic Panel [phone]) - Signed Added new Test order of T-CBC No Diff ([id number]) - Signed Added new Test order of T-Protime, Auto ([id number]) - Signed Added new Test order of T-PTT ([id number]) - Signed

## 2010-05-10 NOTE — Medication Information (Signed)
Summary: ccrv  --agh  Anticoagulant Therapy  Managed by: Vashti Hey, RN PCP: Lillia Pauls Supervising MD: Diona Browner MD, Remi Deter Indication 1: Atrial Fibrillation Lab Used: [facility] [place] Site: [facility] INR POC 4.0  Dietary changes: no    Health status changes: no    Bleeding/hemorrhagic complications: no    Recent/future hospitalizations: yes       Details: pending DCCV after theraputic x 4 weeks  Any changes in medication regimen? yes       Details: has been on coumadin but Dr Milford Cage has been managing  Recent/future dental: no  Any missed doses?: no       Is patient compliant with meds? yes       Allergies: 1)  ! Pcn 2)  ! * Vesteril 3)  ! * Ivp Dye 4)  ! Demerol  Anticoagulation Management History:      The patient is taking warfarin and comes in today for a routine follow up visit.  Positive risk factors for bleeding include an age of 74 years or older and presence of serious comorbidities.  The bleeding index is 'intermediate risk'.  Positive CHADS2 values include History of HTN and History of Diabetes.  Negative CHADS2 values include Age > 89 years old.  Anticoagulation responsible provider: Diona Browner MD, Remi Deter.  INR POC: 4.0.  Cuvette Lot#: [id number].    Anticoagulation Management Assessment/Plan:      The patient's current anticoagulation dose is Coumadin 10 mg tabs: use as directed per Dr. Webb Laws office.  The target INR is 2.0-3.0.  The next INR is due 03/27/2010.  Anticoagulation instructions were given to patient.  Results were reviewed/authorized by Vashti Hey, RN.  She was notified by Vashti Hey RN.        Coagulation management information includes: 03/20/10  Pending DCCV   Ist INR at Dr Webb Laws office.  Current Anticoagulation Instructions: INR 4.0  (2nd Theraputic) Hold coumadin tonight then resume 5mg  once daily except 2.5mg  on Mondays and Wednesdays

## 2010-05-10 NOTE — Medication Information (Signed)
Summary: ccr-lr  Anticoagulant Therapy  Managed by: Vashti Hey, RN PCP: Lillia Pauls Supervising MD: Andee Lineman MD, Michelle Piper Indication 1: Atrial Fibrillation Lab Used: [facility] [place] Site: [place] INR POC 3.9  Dietary changes: no    Health status changes: no    Bleeding/hemorrhagic complications: no    Recent/future hospitalizations: yes       Details: Pending DCCV  Any changes in medication regimen? yes       Details: Started on ProAir HFA  Recent/future dental: no  Any missed doses?: no       Is patient compliant with meds? yes       Allergies: 1)  ! Pcn 2)  ! * Vesteril 3)  ! * Ivp Dye 4)  ! Demerol  Anticoagulation Management History:      The patient is taking warfarin and comes in today for a routine follow up visit.  Positive risk factors for bleeding include an age of 24 years or older and presence of serious comorbidities.  The bleeding index is 'intermediate risk'.  Positive CHADS2 values include History of HTN and History of Diabetes.  Negative CHADS2 values include Age > 19 years old.  Anticoagulation responsible provider: Andee Lineman MD, Michelle Piper.  INR POC: 3.9.  Cuvette Lot#: [id number].    Anticoagulation Management Assessment/Plan:      The target INR is 2.0-3.0.  The next INR is due 04/13/2010.  Anticoagulation instructions were given to patient.  Results were reviewed/authorized by Vashti Hey, RN.  She was notified by Vashti Hey RN.         Prior Anticoagulation Instructions: INR 3.1 (3rd Theraputic) Decrease coumadin to 5mg  once daily except 2.5mg  on M,W,F   Current Anticoagulation Instructions: INR 3.9 Hold coumadin tonight then decrease dose to 2.5mg  once daily except 5mg  on T,Th,Sat Info to [place] to schedule DCCV

## 2010-05-10 NOTE — Letter (Signed)
Summary: Cardioversion/TEE Cardioversion Letter  All     ,     Phone:   Fax:     04/26/2010 MRN: [id number]  [name] [address] [place], [place]  [address]    You are scheduled for a Cardioversion on:  Friday, January 27 at 8:30a.m. with Dr. Andee Lineman.   Please arrive at [facility] at 6:30a.m. on the day of your procedure.  1)   DIET:  A)  Nothing to eat or drink after midnight except your medications with a sip of water.  B)   May have clear liquid breakfast, then nothing to eat or drink after _________ a.m. / p.m.      Clear liquids include:  water, broth, Sprite, Ginger Ale, black coffee, tea (no sugar),      cranberry / grape / apple juice, jello (not red), popsicle from clear juices (not red).  2)   [facility] to [facility] on _______________________ at ____________ a.m./p.m. for your pre-procedure visit.  3)   MAKE SURE YOU TAKE YOUR COUMADIN.  4)   A)   DO NOT TAKE these medications before your procedure:      ___________________________________________________________________     ___________________________________________________________________     ___________________________________________________________________  B)   YOU MAY TAKE ALL of your remaining medications with a small amount of water.    C)   START NEW medications:       ___________________________________________________________________     ___________________________________________________________________  5)   Must have a responsible person to drive you home.  6)   Bring a current list of your medications and current insurance cards.   * Special Note:  Every effort is made to have your procedure done on time. Occasionally there are emergencies that present themselves at the hospital that may cause delays. Please be patient if a delay does occur.  * If you have any questions after you get home, please call the office at [phone].

## 2010-05-15 ENCOUNTER — Encounter: Payer: Self-pay | Admitting: Cardiology

## 2010-05-15 ENCOUNTER — Encounter (INDEPENDENT_AMBULATORY_CARE_PROVIDER_SITE_OTHER): Payer: Medicare Other

## 2010-05-15 DIAGNOSIS — I4891 Unspecified atrial fibrillation: Secondary | ICD-10-CM

## 2010-05-15 DIAGNOSIS — Z7901 Long term (current) use of anticoagulants: Secondary | ICD-10-CM

## 2010-05-15 LAB — CONVERTED CEMR LAB: POC INR: 2.3

## 2010-05-16 NOTE — Medication Information (Signed)
Summary: ccr-lr  Anticoagulant Therapy  Managed by: Vashti Hey, RN PCP: Lillia Pauls Supervising MD: Andee Lineman MD, Michelle Piper Indication 1: Atrial Fibrillation Lab Used: [facility] [place] Site: [facility] INR POC 1.8           Allergies: 1)  ! Pcn 2)  ! * Vesteril 3)  ! * Ivp Dye 4)  ! Demerol  Anticoagulation Management History:      The patient is taking warfarin and comes in today for a routine follow up visit.  Positive risk factors for bleeding include an age of 44 years or older and presence of serious comorbidities.  The bleeding index is 'intermediate risk'.  Positive CHADS2 values include History of HTN and History of Diabetes.  Negative CHADS2 values include Age > 1 years old.  Anticoagulation responsible provider: Andee Lineman MD, Michelle Piper.  INR POC: 1.8.  Cuvette Lot#: [id number].    Anticoagulation Management Assessment/Plan:      The target INR is 2.0-3.0.  The next INR is due 05/15/2010.  Anticoagulation instructions were given to patient.  Results were reviewed/authorized by Vashti Hey, RN.  She was notified by Vashti Hey RN.         Prior Anticoagulation Instructions: INR 3.7 Hold coumadin tonight then resume 5mg  once daily except 2.5mg  on S,T,Th  Current Anticoagulation Instructions: INR 1.8 Pt states nurse at [facility] told her to take 2.5mg  once daily but office note states Dr Andee Lineman said to increase dose to 5mg  once daily.  She has taken 2.5mg  once daily since 05/04/10. Take coumadin 5mg  once daily except 2.5mg  on M,W,F

## 2010-05-24 NOTE — Medication Information (Signed)
Summary: ccr-lr  Lab Visit  Orders Today:  Anticoagulant Therapy  Managed by: Vashti Hey, RN PCP: Lillia Pauls Supervising MD: Andee Lineman MD, Michelle Piper Indication 1: Atrial Fibrillation Lab Used: [facility] [place] Site: [facility] INR POC 2.3  Dietary changes: no    Health status changes: no    Bleeding/hemorrhagic complications: no    Recent/future hospitalizations: no    Any changes in medication regimen? no    Recent/future dental: no  Any missed doses?: no       Is patient compliant with meds? yes         Anticoagulation Management History:      The patient is taking warfarin and comes in today for a routine follow up visit.  Positive risk factors for bleeding include an age of 74 years or older and presence of serious comorbidities.  The bleeding index is 'intermediate risk'.  Positive CHADS2 values include History of HTN and History of Diabetes.  Negative CHADS2 values include Age > 74 years old.  Anticoagulation responsible provider: Andee Lineman MD, Michelle Piper.  INR POC: 2.3.  Cuvette Lot#: [id number].    Anticoagulation Management Assessment/Plan:      The target INR is 2.0-3.0.  The next INR is due 05/25/2010.  Anticoagulation instructions were given to patient.  Results were reviewed/authorized by Vashti Hey, RN.  She was notified by Vashti Hey RN.         Prior Anticoagulation Instructions: INR 1.8 Pt states nurse at [facility] told her to take 2.5mg  once daily but office note states Dr Andee Lineman said to increase dose to 5mg  once daily.  She has taken 2.5mg  once daily since 05/04/10. Take coumadin 5mg  once daily except 2.5mg  on M,W,F  Current Anticoagulation Instructions: INR 2.3 Continue coumadin 5mg  once daily except 2.5mg  on M,W,F

## 2010-05-25 ENCOUNTER — Encounter (INDEPENDENT_AMBULATORY_CARE_PROVIDER_SITE_OTHER): Payer: Medicare Other

## 2010-05-25 ENCOUNTER — Encounter: Payer: Self-pay | Admitting: Cardiology

## 2010-05-25 DIAGNOSIS — Z7901 Long term (current) use of anticoagulants: Secondary | ICD-10-CM

## 2010-05-25 DIAGNOSIS — I4891 Unspecified atrial fibrillation: Secondary | ICD-10-CM

## 2010-05-30 NOTE — Medication Information (Signed)
Summary: ccr-lr  Anticoagulant Therapy  Managed by: Vashti Hey, RN PCP: Lillia Pauls Supervising MD: Myrtis Ser MD, Tinnie Gens Indication 1: Atrial Fibrillation Lab Used: [facility] [place] Site: [facility] INR POC 2.5  Dietary changes: no    Health status changes: no    Bleeding/hemorrhagic complications: no    Recent/future hospitalizations: no    Any changes in medication regimen? no    Recent/future dental: no  Any missed doses?: no       Is patient compliant with meds? yes       Allergies: 1)  ! Pcn 2)  ! * Vesteril 3)  ! * Ivp Dye 4)  ! Demerol  Anticoagulation Management History:      The patient is taking warfarin and comes in today for a routine follow up visit.  Positive risk factors for bleeding include an age of 74 years or older and presence of serious comorbidities.  The bleeding index is 'intermediate risk'.  Positive CHADS2 values include History of HTN and History of Diabetes.  Negative CHADS2 values include Age > 74 years old.  Anticoagulation responsible provider: Myrtis Ser MD, Tinnie Gens.  INR POC: 2.5.  Cuvette Lot#: [id number].    Anticoagulation Management Assessment/Plan:      The target INR is 2.0-3.0.  The next INR is due 06/01/2010.  Anticoagulation instructions were given to patient.  Results were reviewed/authorized by Vashti Hey, RN.  She was notified by Vashti Hey RN.         Prior Anticoagulation Instructions: INR 2.3 Continue coumadin 5mg  once daily except 2.5mg  on M,W,F  Current Anticoagulation Instructions: INR 2.5 Continue coumadin 5mg  once daily except 2.5mg  on M,W,F

## 2010-06-01 ENCOUNTER — Encounter (INDEPENDENT_AMBULATORY_CARE_PROVIDER_SITE_OTHER): Payer: Medicare Other

## 2010-06-01 ENCOUNTER — Encounter: Payer: Self-pay | Admitting: Cardiology

## 2010-06-01 DIAGNOSIS — Z7901 Long term (current) use of anticoagulants: Secondary | ICD-10-CM

## 2010-06-01 DIAGNOSIS — I4891 Unspecified atrial fibrillation: Secondary | ICD-10-CM

## 2010-06-05 NOTE — Medication Information (Signed)
Summary: ccr-lr  Anticoagulant Therapy  Managed by: Vashti Hey, RN PCP: Lillia Pauls Supervising MD: Diona Browner MD, Remi Deter Indication 1: Atrial Fibrillation Lab Used: [facility] [place] Site: [facility] INR POC 2.4  Dietary changes: no    Health status changes: no    Bleeding/hemorrhagic complications: no    Recent/future hospitalizations: no    Any changes in medication regimen? no    Recent/future dental: no  Any missed doses?: no       Is patient compliant with meds? yes       Allergies: 1)  ! Pcn 2)  ! * Vesteril 3)  ! * Ivp Dye 4)  ! Demerol  Anticoagulation Management History:      The patient is taking warfarin and comes in today for a routine follow up visit.  Positive risk factors for bleeding include an age of 74 years or older and presence of serious comorbidities.  The bleeding index is 'intermediate risk'.  Positive CHADS2 values include History of HTN and History of Diabetes.  Negative CHADS2 values include Age > 74 years old.  Anticoagulation responsible provider: Diona Browner MD, Remi Deter.  INR POC: 2.4.  Cuvette Lot#: [id number].    Anticoagulation Management Assessment/Plan:      The target INR is 2.0-3.0.  The next INR is due 06/08/2010.  Anticoagulation instructions were given to patient.  Results were reviewed/authorized by Vashti Hey, RN.  She was notified by Vashti Hey RN.         Prior Anticoagulation Instructions: INR 2.5 Continue coumadin 5mg  once daily except 2.5mg  on M,W,F  Current Anticoagulation Instructions: INR 2.4 Continue coumadin 5mg  once daily except 2.5mg  on M,W,F

## 2010-06-08 ENCOUNTER — Encounter (INDEPENDENT_AMBULATORY_CARE_PROVIDER_SITE_OTHER): Payer: Medicare Other

## 2010-06-08 ENCOUNTER — Encounter: Payer: Self-pay | Admitting: Cardiology

## 2010-06-08 DIAGNOSIS — I4891 Unspecified atrial fibrillation: Secondary | ICD-10-CM

## 2010-06-08 DIAGNOSIS — Z7901 Long term (current) use of anticoagulants: Secondary | ICD-10-CM

## 2010-06-08 LAB — CONVERTED CEMR LAB: POC INR: 1.7

## 2010-06-14 NOTE — Medication Information (Signed)
Summary: ccr-lr  Anticoagulant Therapy  Managed by: Vashti Hey, RN PCP: Lillia Pauls Supervising MD: Andee Lineman MD, Michelle Piper Indication 1: Atrial Fibrillation Lab Used: [facility] [place] Site: [facility] INR POC 1.7  Dietary changes: no    Health status changes: no    Bleeding/hemorrhagic complications: no    Recent/future hospitalizations: no    Any changes in medication regimen? no    Recent/future dental: no  Any missed doses?: no       Is patient compliant with meds? yes       Allergies: 1)  ! Pcn 2)  ! * Vesteril 3)  ! * Ivp Dye 4)  ! Demerol  Anticoagulation Management History:      The patient is taking warfarin and comes in today for a routine follow up visit.  Positive risk factors for bleeding include an age of 35 years or older and presence of serious comorbidities.  The bleeding index is 'intermediate risk'.  Positive CHADS2 values include History of HTN and History of Diabetes.  Negative CHADS2 values include Age > 51 years old.  Anticoagulation responsible provider: Andee Lineman MD, Michelle Piper.  INR POC: 1.7.  Cuvette Lot#: [id number].    Anticoagulation Management Assessment/Plan:      The target INR is 2.0-3.0.  The next INR is due 06/15/2010.  Anticoagulation instructions were given to patient.  Results were reviewed/authorized by Vashti Hey, RN.  She was notified by Vashti Hey RN.         Prior Anticoagulation Instructions: INR 2.4 Continue coumadin 5mg  once daily except 2.5mg  on M,W,F  Current Anticoagulation Instructions: INR 1.7 Take coumadin 7.5mg  tonight then increase dose to 5mg  once daily except 2.5mg  on Mondays and Thursdays

## 2010-06-15 ENCOUNTER — Encounter (INDEPENDENT_AMBULATORY_CARE_PROVIDER_SITE_OTHER): Payer: Medicare Other

## 2010-06-15 ENCOUNTER — Encounter: Payer: Self-pay | Admitting: Cardiology

## 2010-06-15 DIAGNOSIS — I4891 Unspecified atrial fibrillation: Secondary | ICD-10-CM

## 2010-06-15 DIAGNOSIS — Z7901 Long term (current) use of anticoagulants: Secondary | ICD-10-CM

## 2010-06-19 NOTE — Medication Information (Signed)
Summary: ccr-lr  Anticoagulant Therapy  Managed by: Vashti Hey, RN PCP: Lillia Pauls Supervising MD: Andee Lineman MD, Michelle Piper Indication 1: Atrial Fibrillation Lab Used: [facility] [place] Site: [facility] INR POC 2.5  Dietary changes: no    Health status changes: no    Bleeding/hemorrhagic complications: no    Recent/future hospitalizations: no    Any changes in medication regimen? no    Recent/future dental: no  Any missed doses?: no       Is patient compliant with meds? yes       Allergies: 1)  ! Pcn 2)  ! * Vesteril 3)  ! * Ivp Dye 4)  ! Demerol  Anticoagulation Management History:      The patient is taking warfarin and comes in today for a routine follow up visit.  Positive risk factors for bleeding include an age of 74 years or older and presence of serious comorbidities.  The bleeding index is 'intermediate risk'.  Positive CHADS2 values include History of HTN and History of Diabetes.  Negative CHADS2 values include Age > 71 years old.  Anticoagulation responsible provider: Andee Lineman MD, Michelle Piper.  INR POC: 2.5.  Cuvette Lot#: [id number].    Anticoagulation Management Assessment/Plan:      The target INR is 2.0-3.0.  The next INR is due 07/03/2010.  Anticoagulation instructions were given to patient.  Results were reviewed/authorized by Vashti Hey, RN.  She was notified by Vashti Hey RN.        Coagulation management information includes: 03/20/10  Pending DCCV on 05/04/10       06/15/10  Does not want DCCV at this time.  Prior Anticoagulation Instructions: INR 1.7 Take coumadin 7.5mg  tonight then increase dose to 5mg  once daily except 2.5mg  on Mondays and Thursdays  Current Anticoagulation Instructions: INR 2.5 Continue coumadin 5mg  once daily except 2.5mg  on Mondays and Thursdays

## 2010-06-25 LAB — BASIC METABOLIC PANEL
Chloride: 106 mEq/L (ref 96–112)
GFR calc non Af Amer: 33 mL/min — ABNORMAL LOW (ref >60)
Glucose, Bld: 142 mg/dL — ABNORMAL HIGH (ref 70–99)
Potassium: 4.5 mEq/L (ref 3.5–5.1)
Sodium: 136 mEq/L (ref 135–145)

## 2010-06-25 LAB — CBC
HCT: 36.2 % (ref 36.0–46.0)
Hemoglobin: 12.5 g/dL (ref 12.0–15.0)
MCV: 78.9 fL (ref 78.0–100.0)
RDW: 16.7 % — ABNORMAL HIGH (ref 11.5–15.5)

## 2010-06-25 LAB — GLUCOSE, CAPILLARY: Glucose-Capillary: 109 mg/dL — ABNORMAL HIGH (ref 70–99)

## 2010-06-27 LAB — CBC
HCT: 30.5 % — ABNORMAL LOW (ref 36.0–46.0)
HCT: 30.7 % — ABNORMAL LOW (ref 36.0–46.0)
Hemoglobin: 10.3 g/dL — ABNORMAL LOW (ref 12.0–15.0)
Hemoglobin: 10.6 g/dL — ABNORMAL LOW (ref 12.0–15.0)
MCHC: 33.8 g/dL (ref 30.0–36.0)
MCV: 79.3 fL (ref 78.0–100.0)
Platelets: 252 10*3/uL (ref 150–400)
RBC: 3.84 MIL/uL — ABNORMAL LOW (ref 3.87–5.11)
RBC: 3.89 MIL/uL (ref 3.87–5.11)
RBC: 4.36 MIL/uL (ref 3.87–5.11)
RDW: 14.6 % (ref 11.5–15.5)
WBC: 8.3 10*3/uL (ref 4.0–10.5)

## 2010-06-27 LAB — IRON AND TIBC
Iron: 45 ug/dL (ref 42–135)
Saturation Ratios: 14 % — ABNORMAL LOW (ref 20–55)
UIBC: 282 ug/dL

## 2010-06-27 LAB — GLUCOSE, CAPILLARY
Glucose-Capillary: 167 mg/dL — ABNORMAL HIGH (ref 70–99)
Glucose-Capillary: 202 mg/dL — ABNORMAL HIGH (ref 70–99)

## 2010-06-27 LAB — RETICULOCYTES
RBC.: 4.28 MIL/uL (ref 3.87–5.11)
Retic Count, Absolute: 42.8 10*3/uL (ref 19.0–186.0)

## 2010-06-27 LAB — VITAMIN B12: Vitamin B-12: 617 pg/mL (ref 211–911)

## 2010-06-27 LAB — COMPREHENSIVE METABOLIC PANEL
Alkaline Phosphatase: 108 U/L (ref 39–117)
Alkaline Phosphatase: 126 U/L — ABNORMAL HIGH (ref 39–117)
BUN: 19 mg/dL (ref 6–23)
BUN: 23 mg/dL (ref 6–23)
CO2: 25 mEq/L (ref 19–32)
GFR calc non Af Amer: 35 mL/min — ABNORMAL LOW (ref >60)
Glucose, Bld: 101 mg/dL — ABNORMAL HIGH (ref 70–99)
Glucose, Bld: 158 mg/dL — ABNORMAL HIGH (ref 70–99)
Potassium: 4.1 mEq/L (ref 3.5–5.1)
Potassium: 4.7 mEq/L (ref 3.5–5.1)
Total Bilirubin: 0.4 mg/dL (ref 0.3–1.2)
Total Bilirubin: 0.5 mg/dL (ref 0.3–1.2)
Total Protein: 6.3 g/dL (ref 6.0–8.3)
Total Protein: 7.6 g/dL (ref 6.0–8.3)

## 2010-06-27 LAB — DIFFERENTIAL
Basophils Absolute: 0 10*3/uL (ref 0.0–0.1)
Basophils Absolute: 0 10*3/uL (ref 0.0–0.1)
Basophils Relative: 0 % (ref 0–1)
Basophils Relative: 0 % (ref 0–1)
Basophils Relative: 0 % (ref 0–1)
Eosinophils Absolute: 0.3 10*3/uL (ref 0.0–0.7)
Monocytes Absolute: 0.6 10*3/uL (ref 0.1–1.0)
Monocytes Relative: 4 % (ref 3–12)
Monocytes Relative: 7 % (ref 3–12)
Monocytes Relative: 8 % (ref 3–12)
Neutro Abs: 4.2 10*3/uL (ref 1.7–7.7)
Neutro Abs: 5 10*3/uL (ref 1.7–7.7)
Neutro Abs: 6.2 10*3/uL (ref 1.7–7.7)
Neutrophils Relative %: 66 % (ref 43–77)
Neutrophils Relative %: 75 % (ref 43–77)

## 2010-06-27 LAB — LIPID PANEL
Cholesterol: 115 mg/dL (ref 0–200)
LDL Cholesterol: 55 mg/dL (ref 0–99)
VLDL: 35 mg/dL (ref 0–40)

## 2010-06-27 LAB — HEMOGLOBIN A1C: Mean Plasma Glucose: 209 mg/dL

## 2010-06-27 LAB — APTT
aPTT: 51 seconds — ABNORMAL HIGH (ref 24–37)
aPTT: 53 seconds — ABNORMAL HIGH (ref 24–37)

## 2010-06-27 LAB — BASIC METABOLIC PANEL
GFR calc Af Amer: 42 mL/min — ABNORMAL LOW (ref >60)
GFR calc non Af Amer: 35 mL/min — ABNORMAL LOW (ref >60)
Potassium: 4 mEq/L (ref 3.5–5.1)
Sodium: 136 mEq/L (ref 135–145)

## 2010-06-27 LAB — PROTIME-INR
INR: 2.72 — ABNORMAL HIGH (ref 0.00–1.49)
INR: 2.83 — ABNORMAL HIGH (ref 0.00–1.49)
Prothrombin Time: 28.6 seconds — ABNORMAL HIGH (ref 11.6–15.2)

## 2010-06-27 LAB — CARDIAC PANEL(CRET KIN+CKTOT+MB+TROPI): CK, MB: 1.1 ng/mL (ref 0.3–4.0)

## 2010-06-27 LAB — RENAL FUNCTION PANEL
Albumin: 3.1 g/dL — ABNORMAL LOW (ref 3.5–5.2)
Creatinine, Ser: 1.66 mg/dL — ABNORMAL HIGH (ref 0.4–1.2)
GFR calc Af Amer: 37 mL/min — ABNORMAL LOW (ref >60)
Sodium: 138 mEq/L (ref 135–145)

## 2010-06-27 LAB — CK TOTAL AND CKMB (NOT AT ARMC): Total CK: 67 U/L (ref 7–177)

## 2010-07-03 ENCOUNTER — Ambulatory Visit (INDEPENDENT_AMBULATORY_CARE_PROVIDER_SITE_OTHER): Payer: Medicare Other | Admitting: *Deleted

## 2010-07-03 DIAGNOSIS — Z7901 Long term (current) use of anticoagulants: Secondary | ICD-10-CM | POA: Insufficient documentation

## 2010-07-03 DIAGNOSIS — I4891 Unspecified atrial fibrillation: Secondary | ICD-10-CM

## 2010-07-03 LAB — POCT INR: INR: 2.6

## 2010-07-11 LAB — STOOL CULTURE

## 2010-07-11 LAB — HEMOGLOBIN AND HEMATOCRIT, BLOOD: Hemoglobin: 11 g/dL — ABNORMAL LOW (ref 12.0–15.0)

## 2010-07-11 LAB — BASIC METABOLIC PANEL
BUN: 30 mg/dL — ABNORMAL HIGH (ref 6–23)
Creatinine, Ser: 1.65 mg/dL — ABNORMAL HIGH (ref 0.4–1.2)
GFR calc non Af Amer: 31 mL/min — ABNORMAL LOW (ref >60)
Potassium: 4.6 mEq/L (ref 3.5–5.1)

## 2010-07-11 LAB — FECAL LACTOFERRIN, QUANT

## 2010-07-11 LAB — OVA AND PARASITE EXAMINATION

## 2010-07-27 ENCOUNTER — Ambulatory Visit (INDEPENDENT_AMBULATORY_CARE_PROVIDER_SITE_OTHER): Payer: Medicare Other | Admitting: *Deleted

## 2010-07-27 DIAGNOSIS — Z7901 Long term (current) use of anticoagulants: Secondary | ICD-10-CM

## 2010-07-27 DIAGNOSIS — I4891 Unspecified atrial fibrillation: Secondary | ICD-10-CM

## 2010-07-27 LAB — POCT INR: INR: 2.9

## 2010-08-21 NOTE — Procedures (Signed)
[name], [name]              ACCOUNT NO.:  [id number]   MEDICAL RECORD NO.:  [id number]          PATIENT TYPE:  OUT   LOCATION:  SLEE                          FACILITY:  [facility]   PHYSICIAN:  Kofi A. Gerilyn [name], M.D. DATE OF BIRTH:  10-29-1934   DATE OF PROCEDURE:  11/09/2007  DATE OF DISCHARGE:  11/09/2007                             SLEEP DISORDER REPORT   NOCTURNAL POLYSOMNOGRAPHY   REFERRING PHYSICIAN:  Kofi A. Gerilyn [name], MD   INDICATIONS:  A 76 year old female who presents with snoring and  fatigue.   Epworth sleepiness scale 5.  BMI 41.   MEDICATIONS:  OxyContin, Xanax, Catapres, metformin, metoprolol,  hydrochlorothiazide, Nexium, prednisone, Fosamax, Altace.   SLEEP STAGE SUMMARY:  The total recording time is 435 minutes, sleep  efficiency 52%, sleep latency 26 minutes, REM latency 345 minutes, stage  N1 11% , N2 45%, N3 33%, and REM sleep 11%.  The patient did have  disruption due to severe low back pain.  At 2 a.m., the patient took  half a tablet of Xanax 0.5 mg for low back pain.  After the completion  of the studies, the patient was taken to the emergency room by the  technician for severe low back pain.   RESPIRATORY SUMMARY:  Baseline saturation 95%, lowest saturation 72%.  During REM sleep, AHI is 20.   LIMB MOVEMENT SUMMARY:  PLM index 0.   ELECTROCARDIOGRAM SUMMARY:  Average heart rate 64 with isolated PACs  observed.   IMPRESSION:  1. Moderate obstructive sleep apnea syndrome.  2. Abnormal architecture due to poor sleep efficiency.      Kofi A. Gerilyn [name], M.D.  Electronically Signed     KAD/MEDQ  D:  11/17/2007  T:  11/17/2007  Job:  [id number]

## 2010-08-21 NOTE — H&P (Signed)
[name], [name]              ACCOUNT NO.:  [id number]   MEDICAL RECORD NO.:  [id number]          PATIENT TYPE:  INP   LOCATION:  2016                         FACILITY:  [facility]   PHYSICIAN:  Bevelyn Buckles. Bensimhon, MDDATE OF BIRTH:  09/21/1935   DATE OF ADMISSION:  03/31/2007  DATE OF DISCHARGE:                              HISTORY & PHYSICAL   PRIMARY CARE PHYSICIAN:  Dr. Nicoletta Ba   PRIMARY CARDIOLOGIST:  Dr. Lewayne Bunting   HISTORY OF PRESENT ILLNESS:  This is a 76 year old morbidly obese  Caucasian female with history of palpitations, minimal coronary artery  disease, plaquing by cardiac catheterization in 2006 who presented to  [facility] ER with complaints of shortness of breath, chest  pressure, and tachy palpitations.  The patient has a past medical  history of tachy palpitations and has recently been seen in the [place]  office and medications adjusted.  The patient's Toprol was increased to  50 mg at h.s. and ramipril was increased to 5 mg b.i.d. for blood  pressure control management.  The patient, today, had finished eating in  a restaurant, was climbing up stairs, and began to have some shortness  of breath, palpitations, and chest pressure.  The patient was seen at  [facility] emergency room and was given nitroglycerin patch.  She was  also given four baby aspirin and Plavix, started on heparin, and after  discussion with Dr. [name], the patient was Dorette [name] to be transferred  to  [facility] ER and subsequently to cardiac catheterization lab for  cardiac catheterization.   The patient did have a prior heart catheterization in February 2006  revealing mild coronary plaquing noted in the right coronary artery.  Otherwise, negative.  The patient also had normal left ventricular  function with a LVE of 65%.  The patient did have some mild to moderate  left ventricular hypertrophy per echocardiogram with normal right-sided  cardiac chambers and left atrium.   She did have some aortic valve  sclerosis, trivial mitral regurg, but no pericardial effusion was noted.   REVIEW OF SYSTEMS:  Positive for shortness of breath, palpitations, and  chest pressure.   PAST MEDICAL HISTORY:  1. Chronic chest discomfort.  2. Tachy palpitations.      a.     Recent monitor revealing sinus rhythm with first-degree AV       block and occasional second-degree block.  3. Dyspnea on exertion, suspect multifactorial.  4. Minimal nonobstructive coronary plaquing by cardiac      catheterization, February 2006.      a.     Nonischemic adenosine Cardiolite 08/20/2006.  Good left       ventricular ejection fraction of 60%.  5. Diabetes.  6. Dyslipidemia.  7. Hypertension.  8. GERD.  9. Polymyalgia rheumatica, on chronic steroids.  10.Morbid obesity.  11.IV dye allergy.   CURRENT MEDICATIONS:  1. Aspirin 81 mg daily.  2. Metformin 500 mg b.i.d.  3. Nexium 40 mg b.i.d.  4. Vitamin D once a day.  5. Prednisone 10 mg once a day.  6. Ramipril 5 mg daily.  7. HCTZ 50  mg daily.  8. Lantus insulin 105 units q.h.s.  9. Humalog three times a day 30 units.  10.Norvasc 10 mg daily.  11.Metoprolol XL 50 mg daily.  12.Crestor 10 mg daily.   ALLERGIES:  PENICILLIN, VISTARIL, SHELLFISH, AND CONTRAST DYE ALLERGY.   SOCIAL HISTORY:  The patient lives in [place], [place].  She does not  smoke.  She does not drink alcohol.  Her son lives with her.   FAMILY HISTORY:  Father with CAD who died of a MI.   PAST SURGICAL HISTORY:  Appendectomy, hysterectomy.   CURRENT LABS:  Sodium 142, potassium 3.5, chloride 99, CO2 32, BUN 13,  creatinine 1, glucose 97.  Hemoglobin 13.8, hematocrit 40.5, white blood  cells 17.3, platelets 359.  CK 50, MB 2.1, troponin 0.03.   PHYSICAL EXAMINATION:  VITAL SIGNS:  Blood pressure 152/70, heart rate  100 and irregular, respirations 22, temperature 97.1, O2 sat 95% on room  air.  Weight 114.7 kg.  HEENT:  Head is normocephalic, atraumatic.   Eyes PERRLA.  The patient  wears glasses.  Mucous membranes of mouth pink and moist.  Tongue  midline.  NECK:  Supple.  Obese.  No JVD.  No carotid bruits are auscultated.  CARDIOVASCULAR:  Regular rate and rhythm with frequent extrasystoles  noted without rubs or gallops.  Pulses are 2+ and equal bilaterally.  LUNGS:  Clear to auscultation.  ABDOMEN:  Soft, nontender, obese with 2+ bowel sounds.  No pain,  rebound, or guarding noted.  EXTREMITIES:  Without clubbing, cyanosis, or edema although the patient  describes some mild edema noted when in the dependent position.  NEURO:  Cranial nerves 2 through 12 are grossly intact.   EKG revealing normal sinus rhythm with a ventricular rate of 93 beats  per minute.   IMPRESSION:  1. Exertional chest pain with tachy palpitations.  2. Insulin-dependent diabetes.  3. Dyslipidemia.  4. Hypertension.  5. Anxiety.   PLAN:  The patient has been seen and examined by Dr. Arvilla Meres  and myself on arrival to [facility].  The patient has been taken to  cardiac catheterization lab.  She does have a dye allergy and we have  pre-medicated  her with Benadryl 25 mg IV, prednisone 60 mg p.o., and also Pepcid IV.  The patient has been discussed risks and benefits of cardiac  catheterization and is willing to proceed.  We will make further  recommendations based upon hospital course.      Bettey Mare. Lyman Bishop, NP      Bevelyn Buckles. Bensimhon, MD  Electronically Signed    KML/MEDQ  D:  03/31/2007  T:  04/01/2007  Job:  [id number]   cc:   Jeoffrey Massed, MD

## 2010-08-21 NOTE — Assessment & Plan Note (Signed)
[facility]                          [name]   ILAH, BOULE                     MRN:          [id number]  DATE:09/29/2006                            DOB:          09/21/1935    ADDENDUM:  The patient did have a markedly abnormal lipid panel when she  was in the hospital with triglycerides of 305, total cholesterol 227,  HDL 27 and LDL 139.  She would most likely benefit from statin therapy,  given her history of diabetes mellitus.  We will follow up with her  whether or not she has tried statins in the past, and likely make a  decision when she see her back in follow up regarding her treatment  going forward.     Tereso Newcomer, PA-C  Electronically Signed    SW/MedQ  DD: 09/29/2006  DT: 09/29/2006  Job #: [id number]

## 2010-08-21 NOTE — Assessment & Plan Note (Signed)
[facility]                          [name]   [name], [name]                     MRN:          [id number]  DATE:11/14/2006                            DOB:          09/21/1935    CARDIOLOGIST:  Learta Codding, MD,FACC.   PRIMARY CARE PHYSICIAN:  Earley Favor, M.D.   HISTORY OF PRESENT ILLNESS:  Ms. Kelso is a 76 year old female patient  with a history of palpitations and minimal coronary plaquing by cardiac  catheterization in 2006, who returns to the office today for followup.  Please see my note from September 29, 2006, for complete details.  She  continues to have chronic chest discomfort.  She also complained of a  fair amount of dyspnea.  She had not had her CardioNet monitor placed to  further evaluate her tachy palpitations.  We arranged for that and also  placed her on Norvasc for better blood pressure control and treatment of  possible microvascular disease.  We also checked a BNP to rule out the  possibility of congestive heart failure and I suggested proceeding with  PFTs with DLCO to rule out pulmonary disease.  I also placed her back on  Crestor for chronic lipid management.   The patient did have her CardioNet monitor placed.  This revealed normal  sinus rhythm and 1st degree AV block with occasional Wenckebach with 2:1  conduction.  The patient was contacted and asked to decrease her  Metoprolol XL to once daily dosing instead of twice daily.  Her B-MET  returned unremarkable and her BNP returned normal at 21.  She never did  get her PFTs secondary to the development of herpes zoster.   Since being seen, her primary care physician has increased her Norvasc  to 10 mg a day for better blood pressure control.   The patient returns today for followup.  Overall, she notes that she is  doing about the same.  She does note that she is less short of breath  than she was before.  Her chest pain symptoms are  essentially unchanged.  They are quite atypical as noted in the past.  She is quite limited by  her polymyalgia rheumatica.  She is being tapered off of prednisone at  this time.  She denies any syncope, near syncope.  She does note that  her tachy palpitations awaken her in the middle of the night since  changing her Toprol to once daily dosing.  She is only taking it in the  morning.  The palpitations may last up to 30 minutes.  She was having  this in the past when she changed her Toprol to twice daily dosing.   CURRENT MEDICATIONS:  1. Aspirin 81 mg daily.  2. Metformin 500 mg b.i.d.  3. Nexium 40 mg b.i.d.  4. Vitamin D.  5. Prednisone 10 mg a day.  6. Ramipril 5 mg daily.  7. HCTZ 50 mg daily.  8. Lantus 105 units q.h.s.  9. Humalog 3 times a day 30 units.  10.Norvasc 10 mg daily.  11.Metoprolol XL 50 mg daily.  12.Crestor  10 mg daily.   ALLERGIES:  1. PENICILLIN.  2. VISTARIL.  3. SHELLFISH.  4. CONTRAST DYE.   PHYSICAL EXAMINATION:  GENERAL:  She is a well nourished, well developed  female in no distress.  VITAL SIGNS:  Blood pressure 154/70, pulse 76, weight 249 pounds.  HEENT:  Normal.  NECK:  Without JVD.  CARDIAC:  Normal S1 S2.  Regular rate and rhythm.  LUNGS:  Clear to auscultation bilaterally without wheezes, rhonchi, or  rales.  ABDOMEN:  Soft, nontender.  EXTREMITIES:  Without 1 plus edema bilaterally.  Calves soft, nontender.  SKIN:  Warm and dry.  NEUROLOGIC:  She is alert and oriented x3.  Cranial nerves II-XII  grossly intact.   IMPRESSION:  1. Chronic chest pain.  2. Tachy palpitations.      a.     Recent monitor revealing sinus rhythm with 1st degree AV       block and occasional 2nd degree AV block type I (Wenckebach).      b.     Beta-blocker dose recently decreased.  3. Dyspnea on exertion, suspect multifactorial.  4. Minimal nonobstructive coronary plaquing by cardiac catheterization      in February 2006.      a.     Nonischemic  adenosine Cardiolite, Aug 20, 2006.  5. Good left ventricular function.  6. Diabetes mellitus.  7. Dyslipidemia.  8. Hypertension.  9. Gastroesophageal reflux disease.  10.Polymyalgia rheumatica on chronic steroids.  11.Morbid obesity.  12.IV dye allergy.   PLAN:  The patient presents to the office today for followup.  Of note,  she describes tachy palpitations.  This all started back when she  decreased her Toprol dose to once daily.  She also continues to note  shortness of breath.  This seems to be multifactorial.  It seems to be a  little bit better with the current treatment plan.  Her chronic chest  pain is also unchanged.   At this point in time we plan to:  1. Change her Toprol to q.h.s. dosing.  2. Increase her ramipril to 5 mg b.i.d. for better blood pressure      control.  3. We will check a B-MET in one week's time to reassess her renal      function potassium.  4. Recheck followup lipids and LFTs in the next couple of weeks to      assess her lipid management.  5. No further cardiac workup at this time.  I did offer her PFTs but      she would rather hold off at this point in time.  I agree with      this.  I do think most of her symptoms of shortness of breath are      multifactorial and related to her weight and other co-morbidities.  6. The patient will see [place] back in 6 months' time.  She will follow up      sooner if she has continued tachy palpitations after the change in      her Toprol dose is made.      Tereso Newcomer, PA-C  Electronically Signed      Learta Codding, MD,FACC  Electronically Signed   SW/MedQ  DD: 11/14/2006  DT: 11/14/2006  Job #: [id number]   cc:   Jeoffrey Massed, MD

## 2010-08-21 NOTE — Assessment & Plan Note (Signed)
[facility]                          [name]   [name], [name]                     MRN:          [id number]  DATE:09/29/2006                            DOB:          09/21/1935    PRIMARY CARE PHYSICIAN:  Dr. Santiago Bumpers.   CARDIOLOGIST:  She is new to Dr. Lewayne Bunting.   HISTORY OF PRESENT ILLNESS:  [name] is a 76 year old female patient  with history of mild coronary plaquing by cardiac catheterization in  2006 and prior history of multiple cardiac catheterizations who  presented to [facility] on Aug 18, 2006, with palpitations and  chest pain.  She ruled out for myocardial infarction.  Her chest x-ray  showed borderline cardiomegaly and no acute disease.  She was set up for  an echocardiogram that showed mild LVH and normal LV systolic function,  no valvular abnormalities.  She was also set up for an adenosine stress  test.  This revealed no ischemia during stress images.  She did have a  thyroid level checked while she was hospitalized, this was normal at  3.91.  She was to be set up for an outpatient 30-day CardioNet monitor.  She was to follow up in our office today.   The patient notes that the CardioNet monitor never arrived.  She also  notes that she has really not had any palpitations since she left the  hospital.  I think there was somewhat of a concern for atrial  fibrillation.  The patient does note a history of chest discomfort.  She  had a particularly bad night about a week ago.  She has really had these  chest pains for the last several years.  She notes that she has had  these pains at least since she had her last cardiac catheterization in  2006 without change.  She does, however, note some dyspnea on exertion  that seems to be worse over the last 6 months.  Notably, she was  diagnosed with fibromyalgia vs. polymyalgia rheumatica about 6-12 months  ago.  She has been on chronic prednisone  therapy since that time.  She  has gained at least 15 pounds.  She sleeps in a recliner, has done so  for years.  She also notes occasional episodes where she awakens short  of breath but is able to get back to sleep fairly easily.  She does note  lower extremity edema, and her left leg is usually worse than her right.  Her weight is fairly stable over the last several days.  She does note  that her chest discomfort is often relieved by nitroglycerin and is  often associated with nausea and diaphoresis.  She denies any syncope or  near syncope.   CURRENT MEDICATIONS:  1. Aspirin 81 mg daily.  2. Metformin 500 mg b.i.d.  3. Nexium 40 mg a day.  4. Vitamin D.  5. Prednisone 10 mg daily.  6. Ramipril 5 mg daily.  7. Metoprolol 50 mg, two tablets q.h.s.  8. HCTZ 50 mg daily.  9. Lantus insulin 105 units q.h.s.  10.Humalog 30  units three times a day.   ALLERGIES:  1. PENICILLIN.  2. VISTARIL.  3. SHELLFISH.  4. CONTRAST DYE.   PHYSICAL EXAM:  She is a well-nourished, well-developed female in no  acute distress.  Blood pressure 166/94, pulse 65, weight 250 pounds.  Repeat blood pressure by manual cuff 164/80 on the left, 156/80 on the  right.  HEENT:  Normal.  NECK:  Without JVD.  CARDIAC:  Normal S1, S2.  Regular rate and rhythm.  LUNGS:  Clear to auscultation bilaterally without wheezing, rhonchi or  rales.  ABDOMEN:  Soft, nontender with normoactive bowel sounds, no  organomegaly.  EXTREMITIES:  With 1+ edema bilaterally.  Calves soft and nontender.  Skin was warm and dry.  NEUROLOGIC:  She is alert and oriented x3.  Cranial nerves II-XII  grossly intact.   Electrocardiogram reveals sinus rhythm with a heart rate of 66, first  degree AV block, no acute changes.   IMPRESSION:  1. Chest pain.  2. Dyspnea.  3. Palpitations.  4. Minimal nonobstructive coronary plaquing by cardiac catheterization      February, 2006.      a.     Ostial 25% stenosis and proximal luminal  irregularities in       right coronary artery.      b.     Nonischemic adenosine Cardiolite Aug 20, 2006.  5. Good left ventricular function, ejection fraction 55% by recent      echocardiogram.  6. Diabetes mellitus.  7. Untreated dyslipidemia.  8. Hypertension, uncontrolled.  9. CONTRAST DYE ALLERGY.  10.Gastroesophageal reflux disease.  11.Morbid obesity.  12.Polymyalgia rheumatica.      a.     Chronic steroids.   PLAN:  The patient presents to the office today for post hospitalization  followup.  Unfortunately, she did not get her CardioNet monitor placed.  Luckily, she has not had any more episodes of palpitations.  However,  she would be at risk for atrial fibrillation and there was some concern  for that when she was admitted to the hospital.  Will make sure that she  gets her CardioNet monitor placed.   She complains of a lot of dyspnea with exertion.  This seems to be worse  over the last 6-12 months.  Her chest discomfort really is unchanged  over the last several years.  She does have typical and atypical  features to her chest pain.  I questioned whether or not she may have  some microvascular disease that could be contributing to some of her  symptoms.  Her blood pressure does need better control.  Therefore, I  placed her on Norvasc 5 mg a day.  She seems to be optivolemic on exam  but certainly would be at risk for diastolic congestive heart failure.  We will check a BMET and a BNP level today.  If her BNP is significantly  elevated then will need to adjust her diuresis.   She does note quite a bit of wheezing with her shortness of breath.  Will set her up for formal PFTs with DLCO.  She also has  gastroesophageal reflux disease and notes chronic nonproductive cough.  She had a fairly unremarkable chest x-ray in the hospital.  We will ask  her to go ahead and increase her Nexium to twice daily dosing.   I will bring her back in followup with Dr. Andee Lineman after the  above  testing is completed.  She knows to followup sooner if there is any  change in her symptoms.   ADDENDUM:  The patient did have a markedly abnormal lipid panel when she  was in the hospital with triglycerides of 305, total cholesterol 227,  HDL 27 and LDL 139.  She would most likely benefit from statin therapy,  given her history of diabetes mellitus.  We will follow up with her  whether or not she has tried statins in the past, and likely make a  decision when we see her back in follow up regarding her treatment going  forward.      Tereso Newcomer, PA-C  Electronically Signed      Learta Codding, MD,FACC  Electronically Signed   SW/MedQ  DD: 09/29/2006  DT: 09/29/2006  Job #: [id number]   cc:   Jeoffrey Massed, MD

## 2010-08-21 NOTE — Assessment & Plan Note (Signed)
[facility]                          [name]   [name], [name]                     MRN:          [id number]  DATE:04/20/2007                            DOB:          09/21/1935    PRIMARY CARDIOLOGIST:  Learta Codding, MD, [facility]   REASON FOR VISIT:  Post hospital follow-up.   [name] returns to clinic today in follow-up of recent brief  hospitalization at [facility], where she underwent diagnostic coronary  angiography for evaluation of chest pain.  In summary, she was  transferred from [facility] ER, where she presented initially  with complaint of dyspnea, chest pain, and tachy palpitations, directly  to the cardiac catheterization lab, where she was found to have minimal,  nonobstructive CAD with hyperdynamic LV function (75-80%, with no wall  motion abnormalities).   Additionally, she had a D-dimer, which was negative, and her symptoms  were felt to be noncardiac in etiology.  There was suggestion of mildly  elevated right-sided pressures, likely due to combination of obesity and  possible sleep apnea.   Medications were adjusted with up titration of Toprol to 75 daily, for  suppression of intermittent sinus tachycardia with question of SVT  (although there is no documentation to further qualify this).   Subsequently, [name] has been plagued by a persistent bronchitis  and, in fact, was seen here at [facility] ER, on January 4, and  diagnosed with acute bronchitis.  She was discharged on doxycycline,  which she is about to finish, as well as Mucinex.  She has not yet  followed up with Dr. Milinda Cave.   The patient reports no complications of the right groin incision site.  With respect to her palpitations, these have lessened somewhat,  following recent up titration of her beta blocker.  Of note, TSH was  normal as well.   Electrocardiogram revealed NSR 91 bpm with borderline first-degree  AV  block; normal axis; poor R-wave progression with nonspecific ST changes.   CURRENT MEDICATIONS:  1. Aspirin 81 daily.  2. Metformin 500 b.i.d.  3. Prednisone 10 daily.  4. Hydrochlorothiazide 50 daily.  5. Lantus  105 units q.h.s.  6. Humalog 30 units t.i.d.  7. Norvasc 10 daily.  8. Crestor 10 daily.  9. Nexium 40 b.i.d.  10.Metoprolol XL 75 daily.  11.Xanax 0.25 daily.  12.Ramipril 5 b.i.d.   PHYSICAL EXAM:  Blood pressure 185/75, pulse 85, regular; weight 248.  GENERAL:  76 year old female, obese, sitting upright and in no distress.  HEENT: Normocephalic, atraumatic.  NECK:  Palpable carotid pulses without bruits; unable to assess JVD,  secondary to neck girth.  LUNGS:  Diminished breath sounds at bases, with moderate rhonchi; no  wheezes or crackles.  HEART:  Regular rate and rhythm (S1, S2) no significant murmurs.  ABDOMEN:  Protuberant, nontender.  EXTREMITIES:  1 - 2+  lower extremity edema.  NEURO:  No focal deficit.   IMPRESSION:  1. Noncardiac chest pain.      a.     Recent cardiac catheterization revealing minimal,       nonobstructive  coronary artery disease.      b.     Hyperdynamic left ventricular function.      c.     History of nonobstructive coronary artery disease by       multiple, prior cardiac catheterizations.  2. Multifactorial dyspnea.      a.     Question of sleep apnea.      b.     Obesity.      c.     Fibromyalgia.  3. Tachy palpitations.      a.     Recent up titration of beta-blocker.      b.     Documented, intermittent second degree AV block type 1       (Wenckebach), by CardioNet monitoring, July 2008.  4. CONTRAST DYE ALLERGY.  5. Dyslipidemia.  6. Hypertension.  7. Gastroesophageal reflux disease.  8. Morbid obesity.  9. Polymyalgia rheumatica.      a.     Steroid dependent.   PLAN:  1. The patient is advised to up titrate ramipril to 10 mg b.i.d., as      recently recommended by her primary care physician, Dr. Milinda Cave,       for management of uncontrolled hypertension.  She is allso advised      to split her hydrochlorothiazide dose to 25 mg b.i.d., both for      more even distribution of this medication as well as to try to      minimize adverse effects from the higher single dose.  2. The patient is strongly advised to resume follow-up with her      primary care physician, for      ongoing evaluation and management of persistent bronchitis.  3. Return clinic followup with myself and Dr. Andee Lineman in 3 months.      Gene Serpe, PA-C  Electronically Signed      Learta Codding, MD,FACC  Electronically Signed   GS/MedQ  DD: 04/20/2007  DT: 04/20/2007  Job #: [id number]   cc:   Jeoffrey Massed, MD

## 2010-08-21 NOTE — Assessment & Plan Note (Signed)
[facility]                          [name]   [name], [name]                     MRN:          [id number]  DATE:06/30/2008                            DOB:          25-Sep-1934    The patient is a 76 year old female who presents for preoperative  evaluation and also for evaluation of chest pain and dyspnea.  The  patient has been seen in this office previously.  She has a long history  of chest pain.  She underwent cardiac catheterization in February 2006.  At that time, she was found to have nonobstructive coronary disease and  her ejection fraction was 65%.  She also had a Myoview on Aug 19, 2006.  There was no ischemia.  An ejection fraction was 77%.  Finally, she had  another catheterization on March 31, 2007.  She was found to have an  ejection fraction of 75-80% and wall motion was normal.  Her LV function  was felt to be hyperdynamic.  There was a normal left main.  There was a  20% mid LAD.  The circumflex was normal.  There was a 30% proximal right  coronary artery.  The patient has recently been found to have right  hydronephrosis and significant dilatation of the right extrarenal  collecting system.  Surgery is planned to further evaluate.  General  anesthesia will be required.  We were asked to further evaluate.  Note,  the patient does continue to have chest pain.  It is in the substernal  area and described as a pressure.  The pain does not radiate.  It is  not exertional.  It is not pleuritic or positional.  She states that it  is related to stress.  Xanax does help the pain.  Note, this pain has  been longstanding and it is unchanged.  She also has chronic dyspnea on  exertion as well as orthopnea.  She has not had PND.  There is  occasional mild pedal edema.  She also has chronic palpitations that are  described as skipped.  There has been no history of syncope.   PRESENT MEDICATIONS:  1. Metformin 500 mg  p.o. b.i.d.  2. Altace 10 mg p.o. b.i.d.  3. HCTZ 50 mg p.o. daily.  4. Nexium 40 mg p.o. b.i.d.  5. Crestor 10 mg p.o. daily.  6. Catapres patch 0.3 weekly.  7. Aspirin 81 mg p.o. daily.  8. Lopressor ER 75 mg p.o. daily.  9. Insulin.  10.Oxycodone 15 mg q.i.d.  11.Xanax 0.5 mg p.o. t.i.d. as needed.  12.Tizanidine 2 mg p.o. t.i.d.  13.Allegra.   PHYSICAL EXAMINATION:  VITAL SIGNS:  Blood pressure of 144/79 and pulse  of 72.  She weighs 235 pounds.  HEENT:  Normal.  NECK:  Supple.  There are no bruits noted.  CHEST:  Clear.  CARDIOVASCULAR:  Regular rhythm.  There is no murmurs noted.  ABDOMEN:  No tenderness.  EXTREMITIES:  No edema.   Her electrocardiogram shows a sinus rhythm with occasional PVCs.  There  is a first-degree AV block.  The axis is normal.  There are no  significant ST changes noted.   DIAGNOSES:  1. Preoperative evaluation prior to urological surgery - the patient      does continue to have chest pain, but it is atypical, and I do not      think cardiac.  It is longstanding and her electrocardiogram shows      no ST changes.  Note, she did have a cardiac catheterization in      2006 and a repeat in December 2008.  Both of these showed      nonobstructive coronary artery disease.  Also note, she does not      have exertional chest pain.  I do not think we need to pursue      further cardiac evaluation prior to her surgery.  2. Chest pain - as per above her symptoms are atypical and I think      almost likely related to her fibromyalgia.  We will not pursue      further ischemia evaluation at this point.  3. Dyspnea - this has been a chronic issue.  This is most likely      related to deconditioning as well as obesity.  She also has      obstructive sleep apnea.  4. Hypertension - her blood pressure is reasonably well controlled on      her present medications.  5. Hyperlipidemia - she will continue with her statin and her primary      care physician is  following her lipids and liver.  6. Palpitations - this has been longstanding.  She will continue on      her Toprol.  7. Fibromyalgia.   We will see her back in 6 months.     Madolyn Frieze Jens Som, MD, [facility]  Electronically Signed    BSC/MedQ  DD: 06/30/2008  DT: 07/01/2008  Job #: [id number]   cc:   Jeoffrey Massed, MD  Ky Barban, M.D.

## 2010-08-21 NOTE — Discharge Summary (Signed)
[name], [name]              ACCOUNT NO.:  [id number]   MEDICAL RECORD NO.:  [id number]          PATIENT TYPE:  INP   LOCATION:  2016                         FACILITY:  [facility]   PHYSICIAN:  Gerrit Friends. Dietrich Pates, MD, FACCDATE OF BIRTH:  09/21/1935   DATE OF ADMISSION:  03/31/2007  DATE OF DISCHARGE:  04/01/2007                               DISCHARGE SUMMARY   PRIMARY CARDIOLOGIST:  Learta Codding, MD,F.A.C.C.   PRIMARY CARDIOLOGIST:  Jeoffrey Massed, M.D.   PROCEDURES PERFORMED DURING HOSPITALIZATION:  Cardiac catheterization,  right and left, revealing minimal nonobstructive coronary artery  disease, hyperdynamic LV function, mildly elevated right-sided  pressures.   FINAL DISCHARGE DIAGNOSES:  1. Noncardiac chest pain.  2. Tachy palpitations.  3. Dyspnea on exertion.  4. Good left ventricular function.  5. Diabetes.  6. Dyslipidemia.  7. Hypertension.  8. Gastroesophageal reflux disease.  9. Polymyalgia rheumatica.  10.Anxiety.   HISTORY OF PRESENT ILLNESS:  Patient is a 76 year old Caucasian female  with history of diabetes, morbid obesity, hypertension,  hypercholesterolemia, had sudden onset of substernal and subscapular  pressure while walking up stairs after having eaten.  The patient also  had complaints of severe palpitations.  The patient was having episodes  of sinus tachycardia, SVT.  On arrival to [facility], the patient was  admitted and scheduled for cardiac catheterization.   Cardiac catheterization was performed by Dr. Arvilla Meres on  March 31, 2007, with results described above.  The patient had good  results post-procedure without any complaints of chest pain or shortness  of breath.  She did have some recurrence of SVT noted.  The patient did  have an episode of sinus tachycardia with a heart rate up to 120 beats  per minute.  On discharge, she was seen by Dr.  Bing, and  increased the metoprolol from 50 mg a day to 75 mg a  day.  Patient's D-  dimer was normal.  Patient had no complaints of chest discomfort or  shortness of breath during hospitalization.  Family members are at  bedside, very attentive, and the patient will have them with her when  she goes home.   DISCHARGE LABORATORY VALUES:  Sodium 142, potassium 3.5, chloride 99,  CO2 32, BUN 13, creatinine 1, glucose 97.  Hemoglobin 13.8, hematocrit  40.5, white blood cells 17.3, platelets 359.  Troponin negative times  two.  EKG revealing sinus rhythm with ventricular rate of 70 beats per  minute, without evidence of ischemia seen.   DISCHARGE MEDICATIONS:  1. Aspirin 81 mg daily.  2. Metformin 500 mg twice a day.  3. Nexium 40 mg twice a day.  4. Prednisone 10 mg daily.  5. Ramipril 5 mg daily.  6. HCTZ 50 mg daily.  7. Lantus insulin 105 units at bedtime.  8. Humalog insulin 30-35 units three times a day.  9. Norvasc 10 mg daily.  10.Metoprolol 75 mg daily (new higher dose with prescription).  11.Crestor 10 mg at bedtime.  12.Xanax 0.25 mg three times a day p.r.n. anxiety (new prescription      provided).  ALLERGIES:  Patient is allergic to CONTRAST DYE, SHELLFISH, PENICILLIN  and VISTARIL.   DISCHARGE FOLLOW-UP APPOINTMENTS:  1. The patient will follow up with her primary care physician for      continued medical management and diabetes management.  2. The patient will follow with Dr. Lewayne Bunting in [place] on January 12      at 2:45 p.m.   The patient has been given post-cardiac catheterization instructions  with particular emphasis on the right groin site for evidence of  bleeding, hematoma and infection or pain.   The patient has been advised of followup appointments and continuation  with the primary care physician.      Bettey Mare. Lyman Bishop, NP      Gerrit Friends. Dietrich Pates, MD, [facility]  Electronically Signed    KML/MEDQ  D:  04/01/2007  T:  04/01/2007  Job:  [id number]   cc:   Jeoffrey Massed, MD

## 2010-08-21 NOTE — Cardiovascular Report (Signed)
[name], [name]              ACCOUNT NO.:  [id number]   MEDICAL RECORD NO.:  [id number]          PATIENT TYPE:  INP   LOCATION:  2016                         FACILITY:  [facility]   PHYSICIAN:  Bevelyn Buckles. Bensimhon, MDDATE OF BIRTH:  09/21/1935   DATE OF PROCEDURE:  DATE OF DISCHARGE:                            CARDIAC CATHETERIZATION   DATE OF PROCEDURE:  March 31, 2007.   PRIMARY CARE PHYSICIAN:  Dr. Nicoletta Ba   CARDIOLOGIST:  Dr. Andee Lineman   PATIENT IDENTIFICATION:  [name] is a 76 year old woman with  multiple medical problems.  She was admitted to [facility] with chest  pain and dyspnea which is concerning for unstable angina.  She did have  a catheterization almost three years ago which showed normal coronary  arteries.  She was transferred for cardiac catheterization.   PROCEDURES PERFORMED:  1. Right heart cath.  2. Left heart cath.  3. Left ventriculogram.  4. Selective coronary angiography.  5. StarClose closure of the femoral artery.   DESCRIPTION OF PROCEDURE:  The risks and indication of catheterization  was explained.  Consent was signed and placed on the chart.  Given the  patient's history of known contrast allergy, she was premedicated in the  standard fashion prior to catheterization.  Right groin area was prepped  and draped in routine sterile fashion, anesthetized with 1% local  lidocaine.  A 5-French arterial sheath was placed in the right femoral  artery using a modified Seldinger technique.  Standard catheters  including a JL-4, JR-4 and angled pigtail were used for procedure.  All  catheter exchanges made over wire.  A 7-French venous sheath was placed  in the right femoral vein using a modified Seldinger technique.  Standard Swan-Ganz catheter was used for the right heart cath.  Given  the procedure, the patient's right femoral artery was closed with a  StarClose closure device.  There was good hemostasis.  No apparent  complications.   HEMODYNAMIC RESULTS:  Right atrial pressure was mean of 10, RV 44/21, PA  39/19 with a mean of 28.  Pulmonary capillary wedge pressure had a mean  of 17.  There were no significant V-waves.   Central aortic pressure 140/70 with a mean of 105.  LV pressure was  170/10 with an EDP of 13.  There was no aortic stenosis and no mitral  stenosis.   FICK cardiac output was 4.6 liters per minute.  Cardiac index was 2.2  liters per minute per meter squared.  Pulmonary vascular resistance was  2.4 Woods units.   Left main was normal.   LAD was a very large vessel wrapping the apex.  It gave off three small  diagonals.  There was a 20% lesion in the mid-LAD.  Otherwise  angiographically normal.   Left circumflex was a large vessel, gave off a large branching OM-1, a  small OM-2, a small OM-3 and moderate-sized posterolaterals,  angiographically normal.   Right coronary artery was a dominant vessel, gave off a small RV branch,  an acute marginal branch and a moderate-sized PDA.  There is a 30%  proximal lesion in  the RCA, otherwise angiographically normal.   Left ventriculogram done in the RAO position showed a hyperdynamic  ventricle with an EF of 75-80%.  There was no wall motion abnormalities.  No mitral regurgitation.   ASSESSMENT:  1. Minimal nonobstructive coronary artery disease as described above.  2. Hyperdynamic left ventricle.  3. Mildly elevated right-sided pressures, likely due to her obesity      and possible sleep apnea.   PLAN/DISCUSSION:  I doubt her symptoms are cardiac.  Will check a D-  dimer and TSH.  If these are normal, I suspect she can be discharged  home in the morning.      Bevelyn Buckles. Bensimhon, MD  Electronically Signed     DRB/MEDQ  D:  03/31/2007  T:  04/01/2007  Job:  [id number]   cc:   Jeoffrey Massed, MD  Learta Codding, MD,FACC

## 2010-08-24 ENCOUNTER — Encounter: Payer: Medicare Other | Admitting: *Deleted

## 2010-08-24 NOTE — Discharge Summary (Signed)
   [name], [name]                      ACCOUNT NO.:  [id number]   MEDICAL RECORD NO.:  [id number]                   PATIENT TYPE:  INP   LOCATION:  4707                                 FACILITY:  [facility]   PHYSICIAN:  Lorielle Boehning DICTATOR                    DATE OF BIRTH:  09/21/1935   DATE OF ADMISSION:  12/25/2001  DATE OF DISCHARGE:                           DISCHARGE SUMMARY - REFERRING   DATE OF ADMISSION:  December 25, 2001   DATE OF ACTUAL DISCHARGE:  December 30, 2001   ADMISSION PHYSICIAN:  Adel Bing, M.D.   DISCHARGE PHYSICIAN:  Charlies Constable, M.D.   ADDENDUM:  Ms. Savarino' initial discharge was planned for December 29, 2001. However, at 2:00 p.m., Lorre Munroe wrote a note in the chart. While  waiting for discharge, she developed recurrent chest discomfort that she  gave as an 8 on a scale of 0-10. This was associated with shortness of  breath and diaphoresis. This was similar to her preadmission discomfort,  lasting 60 to 90 minutes. She did not initially tell the R.N.  An EKG was  obtained and did not show any specific changes. Lorre Munroe discussed  this with Dr. Juanda Chance. Dr. Juanda Chance felt that she should be kept in the  hospital until tomorrow. Enzymes were rechecked and these were negative for  myocardial infarction. After review with Dr. Juanda Chance, Dr. Juanda Chance felt that a  CT scan was in order to evaluate for pulmonary embolism due to her  reoccurring chest discomfort. Nursing note is in the chart. In the meantime,  continued to show some intermittent chest discomfort and left shoulder  discomfort. She did tell the nurse that she has a history of panic attacks,  especially surrounding her sons death many years ago. She was very anxious  and she really wants to go home. Room air SATs are 98% after ambulating.  Room air SATs stayed at 99% or better. Ct scan was negative for pulmonary  embolism. After review with Dr. Juanda Chance, it was felt that she could be  discharged to home with the instructions that she had received yesterday and  the same diagnosis.                                               Meggie Laseter DICTATOR    DD/MEDQ  D:  12/30/2001  T:  01/01/2002  Job:  [id number]   cc:   Gerrit Friends. Dietrich Pates, M.D. [facility]  [address]  [place]  [place] [address]  Fax: 1   Everardo Beals. Juanda Chance, M.D. [facility]  [address]  [place]  [place] [address]   Jonelle Sidle, M.D. [facility]   Anwar, Dr.  [facility]  [address]  [address]  [place], [place].

## 2010-08-24 NOTE — H&P (Signed)
NAME:  [name], [name]                        ACCOUNT NO.:  [id number]   MEDICAL RECORD NO.:  [id number]                   PATIENT TYPE:  AMB   LOCATION:  DAY                                  FACILITY:  [facility]   PHYSICIAN:  Bernerd Limbo. Leona Carry, M.D.             DATE OF BIRTH:  09/21/1935   DATE OF ADMISSION:  10/07/2003  DATE OF DISCHARGE:                                HISTORY & PHYSICAL   REASON FOR ADMISSION:  This 76 year old female is admitted to this hospital  with a two week history of left lower quadrant pain.   HISTORY OF PRESENT ILLNESS:  This patient has been followed intermittently  over the last four years for chronic diverticulitis.  She usually has been  responsive to Keflex or Cipro, and her symptoms subsided.  In 2001, Dr.  Jena Gauss did an incomplete colonoscopy on her.  Films taken prior to this did  reveal areas of thickening in the region of the proximal sigmoid colon  consistent with diverticulitis.  Her condition has waxed and waned and over  the past two weeks has become much more severe.  When I saw the patient on  June 20, she had markedly distended abdomen and some tenderness of the left  lower quadrant.  CT scan of the abdomen was ordered.  This did reveal  evidence of marked thickening and induration consistent with acute  diverticulitis of the proximal sigmoid.  She was again placed on Cipro and  given Darvocet N for pain.  She was last seen on October 04, 2003.  I had  started her on a liquid diet and daily Fleet's enemas.  She had fairly good  results after the enemas.  On June 28, she was also started on E-Mycin 500  mg b.i.d. and Neomycin 500 mg b.i.d., each for 7 days.  Surgery was  discussed with the patient, and she is scheduled for July 6 for partial  resection of sigmoid colon.   PAST MEDICAL HISTORY:  1. The patient is gravida 5, para 5, abortus 0.  2. Previous appendectomy.  3. Previous total abdominal hysterectomy, bilateral salpingo-oophorectomy.  4. In 1989, we did a partial mastectomy on her fibrocystic disease of the     right wrist.   MEDICATIONS:  1. Prilosec for hiatal hernia with reflux.  2. Inderal 40 mg t.i.d. for mitral valve prolapse.  3. Type II diabetes, taking Glucovance 5/500 two tablets in a.m. and one in     the p.m.   ALLERGIES:  VISTARIL, PENICILLIN.   PHYSICAL EXAMINATION:  GENERAL APPEARANCE:  Healthy, obese 76 year old white  female in moderate discomfort.  VITAL SIGNS:  She is 5 feet 4 inches, weighs 214 pounds, blood pressure  158/84, pulse 80.  HEENT:  Normal.  NECK:  Supple.  Thyroid is not enlarged.  No palpable cervical adenopathy.  CARDIOVASCULAR:  Regular sinus rhythm with no thrills or murmurs.  RESPIRATORY:  Chest clear  to percussion and auscultation.  BREASTS:  Moderate size, symmetrical.  No abnormal masses.  There is a well-  healed operative scar in the right upper quadrant of the right breast.  Examination with axillae is normal.  ABDOMEN:  Distended.  There is hypoactive peristalsis.  There is some  tenderness noted in the left lower quadrant.  There is a well healed midline  operative scar secondary to previous hysterectomy.  EXTREMITIES/BACK:  Negative.  PELVIC:  There is a marital introitus.  No Bartholin or Skene glands.  Vaginal cuff is well healed.  Examination of the adnexa was normal.  RECTOVAGINAL:  Does reveal some soft, brown stool on the examining finger.  LIMBS/BACK:  Normal.   ADMISSION DIAGNOSIS:  Acute diverticulitis.   DISPOSITION:  The patient is admitted now for partial resection of the  sigmoid colon with end-to-end anastomosis.  The surgery, risks,  complications and hospital consequences have been discussed with the  patient.  She is agreeable to surgery  She has been scheduled for July 6.     ___________________________________________                                         Bernerd Limbo. Leona Carry, M.D.   NMD/MEDQ  D:  10/07/2003  T:  10/07/2003  Job:  [id number]

## 2010-08-24 NOTE — Discharge Summary (Signed)
   NAMEIISHA, [name]                      ACCOUNT NO.:  [id number]   MEDICAL RECORD NO.:  [id number]                   PATIENT TYPE:  INP   LOCATION:  4707                                 FACILITY:  [facility]   PHYSICIAN:  Bruce R. Juanda Chance, M.D. [facility]           DATE OF BIRTH:   DATE OF ADMISSION:  12/25/2001  DATE OF DISCHARGE:  12/29/2001                                 DISCHARGE SUMMARY   ADDENDUM:  Brief addendum as follows:  Initial plans were to discharge the  patient on 12/28/01. However, she was not discharged until 12/29/01. She was  seen by Dr. Juanda Chance prior to discharge. He wrote that the patient could  return to work on 10/06 without any restrictions on lifting. As noted, she  did have noncritical coronary artery disease by her catheterization  performed 12/28/01 with a normal left ventricular ejection fraction estimated  to be greater than 55%. Medical management was recommended with continued  treatment of hypertension and elevated lipids. She was started on Zocor this  admission. She is to have a liver function test and a cholesterol panel in  approximately six weeks. She was scheduled to see Arnette Felts for this, an  assistant back in the [facility], on Tuesday 9/30 at 3:15 p.m. for a  groin check and to further monitor her condition.     Delton See, P.A. [facility]                  Bruce R. Juanda Chance, M.D. [facility]    DR/MEDQ  D:  12/29/2001  T:  12/31/2001  Job:  [id number]

## 2010-08-24 NOTE — H&P (Signed)
NAMETENIYA, FILTER              ACCOUNT NO.:  [id number]   MEDICAL RECORD NO.:  [id number]           PATIENT TYPE:   LOCATION:                                FACILITY:  [facility]   PHYSICIAN:  Dalia Heading, M.D.  DATE OF BIRTH:  09/21/1935   DATE OF ADMISSION:  04/06/2004  DATE OF DISCHARGE:  LH                                HISTORY & PHYSICAL   CHIEF COMPLAINT:  Need for screening colonoscopy, history of diverticulitis.   HISTORY OF PRESENT ILLNESS:  The patient is a 76 year old white female who  was referred for endoscopic evaluation.  She needs a colonoscopy for  screening purposes and a history of diverticulitis.  She had a sigmoid  colectomy in July 2005.  She had an incomplete colonoscopy in the recent  past.  No abdominal pain, weight loss, nausea, vomiting, or hematochezia  have been noted.  She has had intermittent diarrhea and constipation.  No  family history of colon carcinoma.  There is no history of hemorrhoidal  disease.   PAST MEDICAL HISTORY:  1.  Hypertension.  2.  Noninsulin-dependent diabetes mellitus.   PAST SURGICAL HISTORY:  1.  Hysterectomy.  2.  Partial colectomy.  3.  Tonsillectomy.   CURRENT MEDICATIONS:  Inderal, Zocor, metformin, hydrochlorothiazide, baby  aspirin.   ALLERGIES:  SHELLFISH, IVP DYE, VISTARIL.   REVIEW OF SYSTEMS:  Noncontributory.   PHYSICAL EXAMINATION:  GENERAL:  The patient is a well-developed, well-  nourished white female in no acute distress.  VITAL SIGNS:  She is afebrile and vital signs are stable.  CHEST:  Lungs clear to auscultation with equal breath sounds bilaterally.  CARDIAC:  Regular rate and rhythm without S3, S4, or murmurs.  ABDOMEN:  Soft, nontender, nondistended.  No hepatosplenomegaly or masses  are noted.  A questionable incisional hernia was present.  RECTAL:  Deferred to the procedure.   IMPRESSION:  1.  Need for screening colonoscopy.  2.  History of diverticulitis.   PLAN:  The patient is  scheduled for a colonoscopy on April 06, 2004.  The  risks and benefits of the procedure, including bleeding and perforation,  were fully explained to the patient, who gave informed consent.     Mark   MAJ/MEDQ  D:  04/03/2004  T:  04/03/2004  Job:  [id number]   cc:   Bernerd Limbo. Leona Carry, M.D.  [address]  [place]  [place] [address]  Fax: [phone]   [facility]  Fax: [phone]

## 2010-08-24 NOTE — Consult Note (Signed)
[name], [name]              ACCOUNT NO.:  [id number]   MEDICAL RECORD NO.:  [id number]          PATIENT TYPE:  INP   LOCATION:  A228                          FACILITY:  [facility]   PHYSICIAN:  Vida Roller, M.D.   DATE OF BIRTH:  09/21/1935   DATE OF CONSULTATION:  DATE OF DISCHARGE:  06/01/2004                                   CONSULTATION   PRIMARY CARE PHYSICIAN:  Dr. Maryjean Morn. McGowen.   CARDIOLOGIST:  Jonelle Sidle, M.D., although she has not been seen  since 2003.   HISTORY OF PRESENT ILLNESS:  [name] is a 76 year old woman with past  medical history for chest pain status post heart catheterization in  September 2003 where she had nonobstructive coronary disease with normal  left ventricular systolic function. She has diabetes, hypertension and  hyperlipidemia and presents to [facility] after two day history of chest  pain with shortness of breath. She describes the chest pain as left anterior  chest heaviness associated with shortness of breath that occurs whenever she  exerts herself or gets emotionally upset. She says the degree of discomfort  in her chest worsens with a degree of activity that she does, and this has  been occurring now for two days. She had a specific episode where she walked  to the mailbox and got profound shortness of breath and chest discomfort. It  was so concerning to her son that he took her to see her physician. She was  seen and admitted. She denies any PND or orthopnea; however, she continues  to have discomfort in her chest which is about a 2/10 in intensity. She was  offered treatment including sublingual nitroglycerin. She has refused this  because she feels the pain is not that bad.   ALLERGIES:  She is allergic to IV DYE, PENICILLIN and VISTARIL.   MEDICATIONS:  Her medications prior to admission were:  1.  Inderal 40 mg twice a day.  2.  Hydrochlorothiazide 25 mg once a day.  3.  Zocor at an unknown dose once a day.  4.   NovoLog insulin 25 units in the morning, 20 units in the evening.  5.  Lantus insulin 59 units q.h.s.  6.  Aspirin 81 mg a day.  7.  She is nitroglycerin on a p.r.n. basis and has used that since her heart      catheterization back in 2003.   PAST MEDICAL HISTORY:  Her past medical history is significant for diabetes.  She has no significant end-organ damage other than the coronary artery  disease, hypertension, hyperlipidemia, gastroesophageal reflux disease,  diverticulitis status post partial resection of her sigmoid colon in July  2005. She has had an appendectomy. She had a partial mastectomy for  fibrocystic disease in her right breast. She has had a hysterectomy.   SOCIAL HISTORY:  She lives in [place], with her husband. She is  retired. Used to work for the [employer]. She is married. She has  four living children. She does not smoke, drink or use drugs.   FAMILY HISTORY:  Her mother died  at age 1 of diabetes complications. Father  died at age 78 of myocardial infarction and complications of diabetes. She  has a half-sister who has a bad heart.   REVIEW OF SYSTEMS:  Her review of systems is generally negative except that  reviewed in the history of present illness.   PHYSICAL EXAMINATION:  VITAL SIGNS:  On physical exam, she is 214 pounds.  She is afebrile. Her pulse is 66, respiratory rate 20, blood pressure is  122/88.  HEENT:  Examination of the head, ears, eyes, nose and throat is  unremarkable.  NECK:  Neck is supple. There is no jugular venous distension or carotid  bruits.  CHEST:  Her chest is clear to auscultation.  CARDIAC:  Her cardiac exam is regular with no significant murmur.  ABDOMEN:  Her abdomen is soft, nontender, normoactive bowel sounds.  GENITOURINARY/RECTAL:  GU and rectal exam were deferred.  EXTREMITIES:  Her extremities are without clubbing, cyanosis or edema.  SKIN:  Skin is without rash.  BREASTS:  Complete breast exam is  deferred.  NEUROLOGIC:  Her neurological exam is nonfocal.   STUDIES:  She has not had a chest x-ray. Her EKG shows sinus rhythm with a  first-degree AV block. She has normal axes. Her PR interval is 214  milliseconds. Her QRS is 86 milliseconds. Her QT is 455. She has nonspecific  ST-T wave changes with diffuse flattening throughout Q-waves in the inferior  leads. We have no old EKGs for comparison. Her laboratories show a white  blood cell count of 9.0, H&H of 13 and 37 with a platelet count 3,000.  Sodium 135, potassium 4.3, chloride 98, bicarb 30, BUN 11, creatinine 0.7.  Her blood sugar is 207. Single set of cardiac enzymes show CK of 41, MB of  1.0, and a troponin of 0.05.   ASSESSMENT:  So, this is a woman with chest discomfort, known coronary  disease, and multiple cardiac risk factors with ongoing active chest  discomfort refusing medical therapy. I have talked with her at length. She  is willing to undergo nitroglycerin. I am going to start her on a  nitroglycerin drip. I am going to change her Lovenox to subcu b.i.d. 1  mg/kg. I am going to continue her aspirin. Will cycle her enzymes. I think  it is probably better for her since it is Friday afternoon and she is still  having active chest pain to probably transport her down to [facility] where  she can be under the supervision of a cardiologist as there is none here on  the weekends at [facility]. She probably will benefit from a significant  sliding scale of insulin, and depending on what her cardiac enzymes show,  decision can be made as to further evaluation. I personally favor heart  catheterization in this lady's case since I think that it would be useful to  define her coronary anatomy given her recurrent ongoing and relatively  severe symptoms.      JH/MEDQ  D:  06/01/2004  T:  06/02/2004  Job:  [id number]

## 2010-08-24 NOTE — Procedures (Signed)
[name], [name]              ACCOUNT NO.:  [id number]   MEDICAL RECORD NO.:  [id number]          PATIENT TYPE:  OUT   LOCATION:  RAD                           FACILITY:  [facility]   PHYSICIAN:  Charlton Haws, M.D.     DATE OF BIRTH:  09/21/1935   DATE OF PROCEDURE:  06/20/2004  DATE OF DISCHARGE:                                  ECHOCARDIOGRAM   INDICATIONS:  Check liver function, palpitations, history of diabetes, and  hypertension, 429.2.   The left ventricular cavity size was normal. There was mild to moderate LVH,  septal thickness was 15 mm. The left atrium and right pericardiac chambers  were normal. There was no evidence of pulmonary hypertension. The mitral  valve was structurally normal with trivial MR. The aortic valve was  trileaflet, minimally sclerotic.  The aortic root was normal. There was  trivial tricuspid insufficiency. Subcostal imaging revealed no atrioseptal  defect, no source of embolus, and no effusion.   FINAL IMPRESSION:  1.  Mild to moderate left ventricular hypertrophy with ejection fraction of      60%.  2.  Normal right-sided cardiac chambers and left atrium.  3.  Aortic valve sclerosis.  4.  Trivial mitral regurgitation.  5.  No pericardial effusion.      PN/MEDQ  D:  06/20/2004  T:  06/20/2004  Job:  [id number]   cc:   Jeoffrey Massed, MD  [address]  [place]  [place] [address]  Fax: [phone]   Vida Roller, M.D.  Fax: [phone]

## 2010-08-24 NOTE — Discharge Summary (Signed)
[name], [name]              ACCOUNT NO.:  [id number]   MEDICAL RECORD NO.:  [id number]          PATIENT TYPE:  INP   LOCATION:  2906                         FACILITY:  [facility]   PHYSICIAN:  Vida Roller, M.D.   DATE OF BIRTH:  09/21/1935   DATE OF ADMISSION:  06/01/2004  DATE OF DISCHARGE:  06/04/2004                           DISCHARGE SUMMARY - REFERRING   PROCEDURE:  Cardiac catheterization on June 04, 2004.   REASON FOR ADMISSION:  [name] is a 76 year old female with history of  nonobstructive coronary artery disease by previous cardiac catheterization  with multiple cardiac risk factors including insulin-requiring diabetes  mellitus who initially presented to [facility] with chest pain.  She was referred to Dr. Dorethea Clan in consultation and recommendation was to  transfer for repeat cardiac catheterization.  Please refer to dictated  admission note for full details.   LABORATORY DATA AND X-RAY FINDINGS:  Potassium 4.2, glucose 213, BUN 12,  creatinine 0.7 at discharge.  CBC normal at discharge.  D-dimer 0.57.  Cardiac markers negative.  Hemoglobin A1C 11.7.   Chest x-ray with cardiomegaly, mild bibasilar linear atelectasis.  Chest CT  scan negative for pulmonary embolus.   HOSPITAL COURSE:  Following transfer from [facility], the patient  was maintained on medication regimen including Lovenox and intravenous  nitroglycerin for treatment of symptoms worrisome for unstable angina  pectoris.  Followup serial cardiac markers were all negative.  D-dimer,  however, was mildly elevated (0.57).  Followup CT scan of the chest,  however, was negative for pulmonary embolus.   Mild hypokalemia was noted and repleted prior to discharge.   The patient was cleared to proceed with cardiac catheterization following  pretreatment for history of contrast allergy.  Cardiac catheterization was  performed by Dr. Antoine Poche (see report for full details) revealing  nonobstructive coronary artery disease with a 25% osteal right coronary  artery lesion and otherwise minimal irregularities.  Left ventricular  function was normal.   The patient was cleared for same day discharge by Dr. Antoine Poche with no  further cardiac workup recommended.   Right groin was stable at discharge with no evidence of hematoma and no  bilateral femoral bruits noted.   DISCHARGE MEDICATIONS:  1.  Coated aspirin 81 mg q.d.  2.  Prevacid 30 mg q.d.  3.  Zocor resume previous dose.  4.  Novolog 25 units b.i.d.  5.  Lantus 59 units q.h.s.  6.  Glucophage 1000 mg b.i.d.  7.  Inderal 40 mg b.i.d.  8.  Hydrochlorothiazide 25 mg q.d.   SPECIAL INSTRUCTIONS:  1.  Resume Glucophage on Wednesday, March 1.  2.  Follow up metabolic profile in 1 week (hyperkalemia) at Dr. Samul Dada      office.   ACTIVITY:  No heavy lifting/driving x2 days.   DIET:  Maintain low fat/cholesterol diet.   WOUND CARE:  Call the office if there is any swelling/bleeding of the groin.   FOLLOW UP:  Follow up with Dr. Vida Roller on Monday, March 13, at 1 p.m.  at [facility].   DISCHARGE DIAGNOSES:  1.  Nonischemic chest pain.      1.  Negative serial cardiac markers.      2.  Nonobstructive coronary artery disease by cardiac catheterization on          June 04, 2004.      3.  Normal left ventricular function.  2.  Contrast dye allergy.  3.  Insulin-requiring diabetes mellitus.  4.  Hypokalemia.  5.  Dyslipidemia.  6.  History of hypertension.  7.  Gastroesophageal reflux disease.      GS/MEDQ  D:  06/04/2004  T:  06/04/2004  Job:  [id number]   cc:   Jeoffrey Massed, MD  [address]  [place]  [place] [address]  Fax: [phone]

## 2010-08-24 NOTE — Op Note (Signed)
NAME:  [name], [name]                        ACCOUNT NO.:  [id number]   MEDICAL RECORD NO.:  [id number]                   PATIENT TYPE:  INP   LOCATION:  A322                                 FACILITY:  [facility]   PHYSICIAN:  Bernerd Limbo. Leona Carry, M.D.             DATE OF BIRTH:  09/21/1935   DATE OF PROCEDURE:  10/13/2003  DATE OF DISCHARGE:                                 OPERATIVE REPORT   PREOPERATIVE DIAGNOSIS:  Acute diverticulitis.   POSTOPERATIVE DIAGNOSIS:  Acute diverticulitis.   OPERATION:  Partial resection of the sigmoid colon with side-to-side  anastomosis.   SURGEON:  Buena Irish, M.D.   ASSISTANT:  Dalia Heading, M.D.   OPERATIVE PROCEDURE:  Under adequate general anesthesia the patient was  prepped and draped in the usual manner.  A midline incision was made  extending from the umbilicus to the pubis.  This was carried through the  subcutaneous tissue, the surface of the anterior rectus sheath which was  incised in the midline.  The posterior rectus sheath and peritoneum incised  and the abdomen opened and explored.   The omentum was plastered to the proximal portion of the sigmoid colon.  By  means of blunt dissection and utilizing the LDS this was freed.  By sharp  and blunt dissection, an incision was made in the lateral peritoneal  reflection and the bowel mobilized.  There was a mass in the midportion of  the sigmoid that measured approximately 4 inches in length.  It was markedly  thickened.  Proximal to this thickened area,  the sigmoid colon was markedly  dilated.  Below the mass the remainder of the sigmoid flexure appeared to be  normal.  This area was mobilized and then utilizing the GIA the mass was  transected proximally and distal to it.  The sigmoid colon was further  mobilized.  A larger, longer incision was made in the peritoneal reflection  and we were able to do a side-to-side anastomosis.  Small incisions were  made in the anterior  mesenteric borders of each side of the bowel.  GIA was  inserted and the anastomosis created.  There was a good size stoma after  this.  TIA was then used to close the stab wounds that were made for  insertion of the GIA and these clips were oversewn with interrupted 3-0  silk.   The wound was thoroughly irrigated.  We did make sure that the [facility] tube  was correctly positioned in the stomach.  There was no evidence of any  perforation.  After it was determined that all bleeding was under control  the peritoneum and posterior rectus sheath were closed with continuous #00  Novofil.  The rectus muscle and anterior  rectus sheath were closed with #00 Novofil.  The subcutaneous tissue was  closed with continuous 3-0 Vicryl.  The skin closed with skin clips.  Telfa,  4 x 4's  and OpSite dressing applied.  The patient tolerated the procedure  nicely and left the room in good condition.     ___________________________________________                                            Bernerd Limbo. Leona Carry, M.D.   NMD/MEDQ  D:  10/14/2003  T:  10/14/2003  Job:  [id number]

## 2010-08-24 NOTE — Cardiovascular Report (Signed)
NAMEMERINDA, [name]                      ACCOUNT NO.:  [id number]   MEDICAL RECORD NO.:  [id number]                   PATIENT TYPE:  INP   LOCATION:  4707                                 FACILITY:  [facility]   PHYSICIAN:  Veneda Melter, M.D. [facility]               DATE OF BIRTH:  09/21/1935   DATE OF PROCEDURE:  12/28/2001  DATE OF DISCHARGE:                              CARDIAC CATHETERIZATION   PROCEDURES PERFORMED:  1. Left heart catheterization.  2. Left ventriculogram.  3. Selective coronary angiography.  4. Thoracic aortogram.   DIAGNOSES:  1. Mild coronary artery disease by angiogram.  2. Normal left ventricular systolic function.   HISTORY:  The patient is a 76 year old white female with a history of  diabetes mellitus, hypertension, who presents with substernal chest  discomfort.  The patient has ruled out for myocardial infarction due to  persistence of symptoms, history of diabetes and hypertension. She is  referred for coronary angiography.   TECHNIQUE:  Informed consent was obtained, patient brought to the cardiac  catheterization lab, and a 6 French sheath was placed in the right femoral  artery using the modified Seldinger technique. A 6 [place] and JR4  catheters were then used to engage the left and right coronary arteries and  selective angiography performed in various projections using manual  injections of contrast. A 6 French pigtail catheter was then advanced to the  left ventricle and left ventriculogram performed using power injections of  contrast. The pigtail catheter was brought back in the ascending aorta and a  thoracic aortogram performed using power injections of contrast. At the  termination of the case, the catheters and sheaths were removed and manual  pressure applied until adequate hemostasis was achieved. The patient  tolerated the procedure well and was transferred to the ward in stable  condition.   FINDINGS:  1. Left main trunk:  Mild  irregularities.  2. LAD:  This is a large caliber vessel that provides three diagonal     branches. The LAD has moderate irregularities of 20-30% in the proximal     mid section.  3. Left circumflex artery:  This is a large caliber vessel that provides a     large bifurcating first marginal branch in the proximal segment and a     small second marginal branch distally.  The AV circumflex has mild     irregularities of 20%.  4. Right coronary artery:  Dominant.  This is a medium caliber vessel that     provides the posterior descending artery in its terminal segment.  The     right coronary artery has mild irregularities of 20%.   LEFT VENTRICULOGRAM:  Normal end-systolic and end-diastolic dimensions.  Overall, left ventricular function is well preserved, ejection fraction  greater than 55%.  No mitral regurgitation. LV pressure is 170/0, aortic is  170/80.  LVEDP equals 20.   Thoracic areas of normal  caliber with no evidence of vessel damage or tear.   ASSESSMENT AND PLAN:  The patient is a 76 year old female with noncritical  coronary artery disease and normal left ventricular function who presents  with substernal chest discomfort.  The patient should undergo continued  medical therapy for her coronary disease with risk factor modification  including aggressive atrial blood pressure and statin therapy.  She may have  diastolic dysfunction due to her hypertension which may be contributing to  her symptoms.  Other causes of chest pain will also be investigated and the  patient should continue on a proton pump inhibitor.                                                  Veneda Melter, M.D. [facility]    NG/MEDQ  D:  12/28/2001  T:  12/29/2001  Job:  [id number]   cc:   Jonelle Sidle, M.D. [facility]   Erasmo Downer, M.D.  [place], [place]

## 2010-08-24 NOTE — H&P (Signed)
NAMEKRISTON, [name]              ACCOUNT NO.:  [id number]   MEDICAL RECORD NO.:  [id number]          PATIENT TYPE:  INP   LOCATION:  A228                          FACILITY:  [facility]   PHYSICIAN:  Calvert Cantor, M.D.     DATE OF BIRTH:  09/21/1935   DATE OF ADMISSION:  06/01/2004  DATE OF DISCHARGE:  LH                                HISTORY & PHYSICAL   The patient is a patient of Dr. Milinda Cave.   CHIEF COMPLAINT:  Chest pain.   HISTORY OF PRESENT ILLNESS:  This is a 76 year old white female who has a  past medical history of CAD.  She had a catheterization done in 2003, which  showed nonobstructive coronary artery disease, and medical management was  indicated.  The patient states now that she has been having some left-sided  chest pressure with exertion, which radiates to her left shoulder.  She has  some palpitations associated with this pressure, and it is relieved with  nitroglycerin and with rest.  She also states that about two days ago, she  awoke from sleep extremely short of breath and she was not able to lie back  down again.  She has been sleeping in a recliner for the past two days now.  She states that on lying down, she gets short of breath and feels the chest  pressure again.  She has no other complaints.   REVIEW OF SYSTEMS:  Negative for fever or cough or flu-like symptoms.  No  abdominal pain, no dysuria, no diarrhea.  Is positive for mild swelling of  her ankles for the past couple of days.   PAST MEDICAL HISTORY:  1.  CAD.  2.  Hypertension.  3.  Diabetes mellitus type 2.  4.  Diverticulitis.   PAST SURGICAL HISTORY:  1.  A partial colectomy for diverticulitis.  2.  Hysterectomy with bilateral oophorectomy.  3.  Tonsillectomy.   CURRENT MEDICATIONS:  1.  The patient states that she takes insulin 25 units of 70/30 NovoLog in      the morning, 20 units of 70/30 NovoLog with supper, 59 units of Lantus      before bedtime.  2.  She also takes 1000 mg of  metformin b.i.d.  3.  Inderal 40 mg b.i.d.  4.  Hydrochlorothiazide 25 mg daily.  5.  Zocor, dose unknown.  6.  Prevacid daily.  7.  Aspirin 81 mg a day.   ALLERGIES:  She states she is allergic to PENICILLIN, which causes shortness  of breath and a rash, and also to [facility].   SOCIAL HISTORY:  She is a nonsmoker.  She never smoked.  She does not drink  any alcohol.  She is married.  Her husband lives in a nursing home, and she  lives with her son.   PHYSICAL EXAMINATION:  GENERAL:  A middle-aged white female lying  comfortably in bed on an incline.  HEENT:  Atraumatic, normocephalic.  Pupils equal, round, and reactive to  light and accommodation.  Extraocular muscles are intact.  Mucosa is moist.  NECK:  Supple.  There is  no JVD, there is no lymphadenopathy, and no other  mass is palpated.  CHEST:  The lungs are completely clear with good air entry.  CHEST:  Heart is regular rate and rhythm, no murmurs.  ABDOMEN:  Soft, nontender, nondistended, bowel sounds are positive.  EXTREMITIES:  Trace pitting edema at the ankles.  Pedal pulses are positive  bilaterally.   BLOOD WORK:  White count is 9.0, hemoglobin is 12.9, hematocrit is 36.7, MCV  is 82.9, platelets are 300.  Sodium is 135, potassium is 4.3, chloride is  98, bicarb is 30, glucose is 207, BUN is 11, creatinine is 0.7, calcium is  9.5.  First set of cardiac enzymes shows total CK of 41, MB is 1.0, index is  not calculated, troponin I is 0.05.  EKG has not been done yet.   ASSESSMENT AND PLAN:  This is a 76 year old white female with complaints  pointing toward unstable angina.  The patient has been admitted.  She will  have nitroglycerin p.r.n. for chest pain.  A lipid profile has been ordered.  Her routine medications will be continued.  She has been started on oxygen  and Lovenox for deep vein thrombosis prophylaxis.  The patient sees [facility], who did her catheterization about two years ago; therefore,   [facility] will again be consulted for a possible stress test or  catheterization.      SR/MEDQ  D:  06/01/2004  T:  06/01/2004  Job:  [id number]

## 2010-08-24 NOTE — Discharge Summary (Signed)
NAME:  [name], [name]                        ACCOUNT NO.:  [id number]   MEDICAL RECORD NO.:  [id number]                   PATIENT TYPE:  INP   LOCATION:  A322                                 FACILITY:  [facility]   PHYSICIAN:  Bernerd Limbo. Leona Carry, M.D.             DATE OF BIRTH:  09/21/1935   DATE OF ADMISSION:  10/12/2003  DATE OF DISCHARGE:  10/17/2003                                 DISCHARGE SUMMARY   ADMITTING DIAGNOSIS:  Acute diverticulitis.   DISCHARGE DIAGNOSIS:  Acute diverticulitis.   OPERATION:  On July 6 left hemicolectomy for acute sigmoid diverticulitis.   COMPLICATIONS:  None.   PROGNOSIS:  Good.   HISTORY:  This 76 year old white female was admitted to this hospital on  July 6 with a long history of diverticulitis.  She has been watching her  diet and taking antibiotics for several years.  Most recently she did have a  barium enema that showed a constricted area in the midsigmoid of the colon.  Her symptoms progressed and she was admitted on the 6th for surgery.  She  had a left hemicolectomy for acute sigmoid colon diverticulitis.  Her  postoperative course was excellent.  Foley was removed on July 8, Ilopan  drip was started.  She started passing flatus on July 8.  Her LeVeen tube  was removed.  On that date she was started on clear liquids.  She was having  a bowel movement by the July 10.  She was on a soft diet.  At that point she  was discharged home on July 11 to be followed in the office in 1 week. Her  prognosis is good.     ___________________________________________                                         Bernerd Limbo. Leona Carry, M.D.   NMD/MEDQ  D:  11/25/2003  T:  11/25/2003  Job:  [id number]

## 2010-08-24 NOTE — Discharge Summary (Signed)
NAMEDALYCE, RENNE ANN                      ACCOUNT NO.:  [id number]   MEDICAL RECORD NO.:  [id number]                   PATIENT TYPE:  INP   LOCATION:  4707                                 FACILITY:  [facility]   PHYSICIAN:  Gerrit Friends. Dietrich Pates, M.D. [facility]        DATE OF BIRTH:  09/21/1935   DATE OF ADMISSION:  12/25/2001  DATE OF DISCHARGE:  12/29/2001                           DISCHARGE SUMMARY - REFERRING   PROCEDURE:  1. Cardiac catheterization.  2. Coronary arteriogram.  3. Left ventriculogram.   HOSPITAL COURSE:  The patient is a 76 year old female with no known history  of coronary artery disease who has a long history of chest pain for which  she has in the past received nitroglycerin spray from her primary care  physician. She does have a history of mitral valve prolapse as well as  diabetes and hyperlipidemia. She went to [facility] on 12/24/01 for  chest pain that started approximately 5:30 p.m. and radiated to her left  ear. She was seen by cardiology at [facility] and transferred to  [facility] for further evaluation and treatment.   Her enzymes were negative for MI and because of an allergy to IV dye, she  was given prophylaxis including prednisone and Zantac as well as Benadryl.  She had a cardiac catheterization on 12/28/01 that showed mild disease in her  left main and some minimal disease of 20 to 30% in the LAD diagonal system.  The circumflex had 20% stenosis and the RCA had a 20% stenosis. There was no  dissection in her thoracic aorta. Her EF was greater than 55% with no MR. It  was felt that she had noncritical coronary artery disease with normal left  ventricular function and that she needed risk factor modification including  blood pressure control and control of her cholesterol. She is to followup  with Dr. Linna Darner.   The patient had a HDL of 189 but had been tried on Lipitor in the past and  was intolerant of it. She was started on  Zocor 40 mg in the hospital and is  to get followup liver function testing and cholesterol profile in six weeks.  She stated that she would followup with Dr. Linna Darner for her general medical  problems and that he had recently added Starlix to her medication regimen of  Avandia in order to assist in controlling her blood sugars. She also stated  that she had been taking Protonix regularly for gastroesophageal reflux  disease symptoms, and this was doing a good job of controlling them. She was  continued on her home medications overnight, and pending stable going  overnight and no problems from her dye allergy, she will be discharged on  12/29/01.   LABORATORY VALUES:  Hemoglobin 12.1, hematocrit 35.4, WBCs 5.0, platelets  243. Sodium 138, potassium 3.9, chloride 102, CO2 30, BUN 11, creatinine  0.7, glucose 174.   DISCHARGE CONDITION:  Stable.   DISCHARGE DIAGNOSIS:  1. Chest pain, no critical coronary artery disease by catheterization this     admission.  2. History of mitral valve prolapse.  3. Noninsulin-dependent diabetes.  4. Allergies to penicillin, Vistaril, and IV dye.  5. Obesity.  6. Status post hysterectomy.  7. Family history of premature coronary artery disease.  8. Dyslipidemia with a LDL of 189.  9. Gastroesophageal reflux disease.   DISCHARGE INSTRUCTIONS:  1. Her activity level is to include no driving, no strenuous activities for     two days. She is required to lift boxes up to 48 pounds at work and will     return to work __________. She is to stick  to a low-fat diabetic diet.     She is to call the office for problems with her catheterization site. She     is to followup with Dr. Diona Browner and with Dr. Linna Darner. She is to get liver     tests and cholesterol tests in six weeks.   DISCHARGE MEDICATIONS:  1. Avandia two tablets q.d. at home dose.  2. Inderal 40 mg t.i.d.  3. Nexium 40 mg q.d.  4. Zocor 40 mg q.d.  5. Starlix 60 mg b.i.d.  6. Hydrochlorothiazide 50  mg q.d.     Lavella Hammock, P.A. [facility]                  Gerrit Friends. Dietrich Pates, M.D. [facility]    RG/MEDQ  D:  12/28/2001  T:  12/30/2001  Job:  [id number]   cc:   [facility]  [address] [place] [address]

## 2010-08-24 NOTE — Cardiovascular Report (Signed)
NAMETRAVIS, PURK              ACCOUNT NO.:  [id number]   MEDICAL RECORD NO.:  [id number]          PATIENT TYPE:  INP   LOCATION:                               FACILITY:  [facility]   PHYSICIAN:  Rollene Rotunda, M.D.   DATE OF BIRTH:  09/21/1935   DATE OF PROCEDURE:  06/04/2004  DATE OF DISCHARGE:  06/04/2004                              CARDIAC CATHETERIZATION   PROCEDURE:  Left heart catheterization/coronary arteriography.   INDICATIONS:  Evaluate patient with chest pain and a mildly elevated  troponin.   PROCEDURAL NOTE:  Left heart catheterization was performed via the right  femoral artery.  The artery was cannulated using anterior wall puncture.  A  #6-French arterial sheath was inserted via the modified Seldinger technique.  Preformed Judkins and a pigtail catheter were utilized.  The patient  tolerated the procedure well and left the laboratory in stable condition.   RESULTS:  HEMODYNAMICS:  LV 193/31, AO 194/97.   CORONARIES:  The left main was normal.   The LAD was large and normal and wrapped the apex.  First, second, and third  diagonals were small and normal.   Circumflex in the AV groove was normal.  OM1 was very large and branching  and normal.  OM2 and OM3 were small and normal.  The posterolateral was  moderate size and normal.   The right coronary artery was a large, dominant vessel, and normal.  There  was ostial 25% stenosis and proximal luminal irregularities.  The right  coronary gave off a large PDA which was normal.   LEFT VENTRICULOGRAM:  The left ventriculogram was obtained in the RAO  projection.  The EF was 65% with normal wall motion.   CONCLUSION:  Mild coronary plaque.   PLAN:  No further cardiac work-up is suggested.  The patient's discomfort  could be coming from small vital signs disease related to her metabolic  syndrome.  She should continue to have aggressive primary risk reduction.  She will also follow up with her primary care giver  to work up possible non-  anginal etiologies.       ___________________________________________  Rollene Rotunda, M.D.    JH/MEDQ  D:  06/04/2004  T:  06/04/2004  Job:  [id number]

## 2010-08-28 ENCOUNTER — Ambulatory Visit (INDEPENDENT_AMBULATORY_CARE_PROVIDER_SITE_OTHER): Payer: Medicare Other | Admitting: *Deleted

## 2010-08-28 DIAGNOSIS — I4891 Unspecified atrial fibrillation: Secondary | ICD-10-CM

## 2010-08-28 DIAGNOSIS — Z7901 Long term (current) use of anticoagulants: Secondary | ICD-10-CM

## 2010-08-30 ENCOUNTER — Other Ambulatory Visit: Payer: Self-pay | Admitting: Family Medicine

## 2010-09-19 ENCOUNTER — Encounter: Payer: Self-pay | Admitting: Cardiology

## 2010-09-24 ENCOUNTER — Other Ambulatory Visit: Payer: Self-pay | Admitting: Family Medicine

## 2010-09-24 NOTE — Telephone Encounter (Signed)
Please advise 

## 2010-09-24 NOTE — Telephone Encounter (Signed)
This is a patient of [facility] in [place]--- she is no longer my patient.  Please reroute this rx refill request.  Thx--PM

## 2010-09-25 ENCOUNTER — Ambulatory Visit (INDEPENDENT_AMBULATORY_CARE_PROVIDER_SITE_OTHER): Payer: Medicare Other | Admitting: *Deleted

## 2010-09-25 DIAGNOSIS — I4891 Unspecified atrial fibrillation: Secondary | ICD-10-CM

## 2010-09-25 DIAGNOSIS — Z7901 Long term (current) use of anticoagulants: Secondary | ICD-10-CM

## 2010-09-25 LAB — POCT INR: INR: 3.8

## 2010-09-28 ENCOUNTER — Telehealth: Payer: Self-pay | Admitting: *Deleted

## 2010-09-28 ENCOUNTER — Other Ambulatory Visit: Payer: Self-pay | Admitting: *Deleted

## 2010-09-28 DIAGNOSIS — I4891 Unspecified atrial fibrillation: Secondary | ICD-10-CM

## 2010-09-28 DIAGNOSIS — R002 Palpitations: Secondary | ICD-10-CM

## 2010-09-28 DIAGNOSIS — R42 Dizziness and giddiness: Secondary | ICD-10-CM

## 2010-09-28 DIAGNOSIS — R079 Chest pain, unspecified: Secondary | ICD-10-CM

## 2010-09-28 NOTE — Telephone Encounter (Signed)
Order from [facility] on 09/27/10 for pt to have outpatient Holter (48 hour) for A.Fib, dizziness, and palps.   Spoke with pt. She will come on 10/03/10 for monitor placement.

## 2010-10-01 ENCOUNTER — Encounter (INDEPENDENT_AMBULATORY_CARE_PROVIDER_SITE_OTHER): Payer: Medicare Other | Admitting: *Deleted

## 2010-10-01 ENCOUNTER — Other Ambulatory Visit: Payer: Self-pay | Admitting: Family Medicine

## 2010-10-01 DIAGNOSIS — R002 Palpitations: Secondary | ICD-10-CM

## 2010-10-03 ENCOUNTER — Encounter: Payer: Medicare Other | Admitting: *Deleted

## 2010-10-03 DIAGNOSIS — R072 Precordial pain: Secondary | ICD-10-CM

## 2010-10-04 ENCOUNTER — Encounter: Payer: Self-pay | Admitting: Physician Assistant

## 2010-10-15 ENCOUNTER — Other Ambulatory Visit: Payer: Self-pay | Admitting: *Deleted

## 2010-10-15 ENCOUNTER — Other Ambulatory Visit: Payer: Self-pay | Admitting: Family Medicine

## 2010-10-15 MED ORDER — WARFARIN SODIUM 5 MG PO TABS: 5.0000 mg | ORAL_TABLET | ORAL | Status: DC

## 2010-10-17 ENCOUNTER — Telehealth: Payer: Self-pay | Admitting: *Deleted

## 2010-10-17 NOTE — Telephone Encounter (Signed)
Patient states she took an extra Metoprolol by mistake this morning.  Was very concerned that her heart rate may drop too low.  Advised her that she could hold her evening dose if need to.  Can monitor through out the day.  If become symptomatic/syncopal call 911 or go to ED for evaluation.  Patient verbalized understanding.

## 2010-10-23 ENCOUNTER — Ambulatory Visit (INDEPENDENT_AMBULATORY_CARE_PROVIDER_SITE_OTHER): Payer: Medicare Other | Admitting: *Deleted

## 2010-10-23 ENCOUNTER — Ambulatory Visit (INDEPENDENT_AMBULATORY_CARE_PROVIDER_SITE_OTHER): Payer: Medicare Other | Admitting: Cardiology

## 2010-10-23 ENCOUNTER — Encounter: Payer: Self-pay | Admitting: Cardiology

## 2010-10-23 VITALS — BP 124/66 | HR 77 | Ht 63.0 in | Wt 224.0 lb

## 2010-10-23 DIAGNOSIS — Z7901 Long term (current) use of anticoagulants: Secondary | ICD-10-CM

## 2010-10-23 DIAGNOSIS — I2510 Atherosclerotic heart disease of native coronary artery without angina pectoris: Secondary | ICD-10-CM

## 2010-10-23 DIAGNOSIS — I10 Essential (primary) hypertension: Secondary | ICD-10-CM

## 2010-10-23 DIAGNOSIS — I4891 Unspecified atrial fibrillation: Secondary | ICD-10-CM

## 2010-10-23 MED ORDER — METOPROLOL TARTRATE 50 MG PO TABS: 50.0000 mg | ORAL_TABLET | ORAL | Status: DC

## 2010-10-23 MED ORDER — CLONIDINE HCL 0.1 MG/24HR TD PTWK: 1.0000 | MEDICATED_PATCH | TRANSDERMAL | Status: DC

## 2010-10-23 NOTE — Assessment & Plan Note (Signed)
The patient appears to have orthostatic hypotension based on her clinical symptoms. This is likely explaining her weakness. Clonidine patch will be decreased from 0.3 mg and changed to 0.1 mg q. weekly. The patient will need to continue monitoring her blood pressure.

## 2010-10-23 NOTE — Assessment & Plan Note (Signed)
Atypical chest pain. Nonobstructive coronary disease by prior cardiac catheterizations. Recent Lexi scan and no evidence of ischemia with normal ejection fraction both by perfusion imaging and echocardiogram.

## 2010-10-23 NOTE — Patient Instructions (Signed)
   Change Clonidine patch to 0.1mg  patch weekly Your physician wants you to follow up in: 6 months.  You will receive a reminder letter in the mail one-two months in advance.  If you don't receive a letter, please call our office to schedule the follow up appointment

## 2010-10-23 NOTE — Assessment & Plan Note (Signed)
Compliant with Coumadin therapy and no complications.

## 2010-10-23 NOTE — Assessment & Plan Note (Signed)
Patient is in persistent atrial fibrillation she failed a prior attempt at cardioversion. However at this point the patient appears to have orthostatic symptoms and has relatively low blood pressure. Upon standing the patient becomes rapidly symptomatic. We could not document definite orthostasis in the office because the patient had severe pain lying flat with an elevated blood pressure in supine position. At this point there are no immediate plans for cardioversion. The patient however is on Coumadin therapy for stroke prevention.

## 2010-10-23 NOTE — Progress Notes (Signed)
HPI  The patient is a 76 year old female with a history of chest pain syndrome with nonobstructive coronary artery disease, permanent atrial fibrillation on chronic Coumadin therapy hypertension diabetes mellitus and dyslipidemia. The patient has had a prior attempt at cardioversion but was unsuccessful. During this procedure the patient was given Robinul which increased her heart rate and rhythm first her from giving ibutilide. In the interim the patient has remained in atrial fibrillation. The patient had an admission in June, last month, secondary to chest pain and atrial fibrillation with slow ventricular response. She was scheduled for an outpatient Holter monitor which showed a mean heart rate of 75 beats per minute maximum heart rate of 68 beats per minute and a minimum heart rate of 37 beats per minute the longest pause was 3.06 seconds however this occurred during the nighttime. The patient reported occasional palpitations but this was not associated with significant rapid heart rates. Review of the Holter monitor shows no definite pacemaker criteria. In followup the patient also had a Lexi scan done. There was no evidence of scar or ischemia. This was felt to be a normal stress nuclear scan with an ejection fraction of 66%. The patient also had an echocardiogram done which showed an ejection fraction 55-60%. With a mildly dilated left atrium. Otherwise the study was unremarkable. At this point the patient remains in permanent atrial fibrillation but reports no significant palpitations. Her main complaint is one of weakness. She states that after she takes a shower for approximately 8 minutes she becomes very lightheaded and shaky. When I move the patient from the chair to the stretcher she became dizzy. She states that she been feeling very weak after she has lost 20 pounds on [place]. Her blood pressures relatively low today at 104/70. She is also on high-dose clonidine patch 0.3 mg daily in addition  to her other cardiac medications. The patient has already cut back her evening dose of metoprolol from 50-25 mg a day. The patient denies any orthopnea PND or syncope. She denies any exertional chest pain  Allergies  Allergen Reactions  . Iohexol   . Meperidine Hcl     REACTION: n/v  . Penicillins     REACTION: CAUSES RASH,NAUSEA,VOMITTING    Current Outpatient Prescriptions on File Prior to Visit  Medication Sig Dispense Refill  . acetaminophen (TYLENOL) 325 MG tablet Take 650 mg by mouth every 6 (six) hours as needed. Dosage not specified       . albuterol (PROAIR HFA) 108 (90 BASE) MCG/ACT inhaler Inhale 2 puffs into the lungs every 4 (four) hours as needed.        . ALPRAZolam (XANAX) 1 MG tablet Take 1 mg by mouth 4 (four) times daily as needed.       [name] amLODipine (NORVASC) 10 MG tablet Take 10 mg by mouth daily.        . bisacodyl (DULCOLAX) 5 MG EC tablet Take 5 mg by mouth as needed.        [name] esomeprazole (NEXIUM) 40 MG capsule Take 40 mg by mouth 2 (two) times daily.        . hydrochlorothiazide 50 MG tablet Take 50 mg by mouth daily.        . insulin glargine (LANTUS) 100 UNIT/ML injection Inject 40 Units into the skin at bedtime. UAD      . insulin lispro (HUMALOG) 100 UNIT/ML injection Inject into the skin. UAD       . nitroGLYCERIN (NITROSTAT) 0.4 MG SL  tablet Place 0.4 mg under the tongue every 5 (five) minutes as needed.        [name] oxyCODONE (ROXICODONE) 15 MG immediate release tablet Take 15 mg by mouth every 6 (six) hours as needed.       . rosuvastatin (CRESTOR) 10 MG tablet Take 10 mg by mouth daily.        . tizanidine (ZANAFLEX) 2 MG capsule Take 2 mg by mouth 3 (three) times daily as needed.       . tobramycin (TOBREX) 0.3 % ophthalmic solution Place 1 drop into the left eye every 4 (four) hours. Monthly before injections      . warfarin (COUMADIN) 5 MG tablet Take 1 tablet (5 mg total) by mouth as directed.  30 tablet  2  . zolpidem (AMBIEN) 10 MG tablet Take 10 mg  by mouth at bedtime as needed.          Past Medical History  Diagnosis Date  . Loss of weight   . Diarrhea   . Esophageal reflux   . Type I (juvenile type) diabetes mellitus without mention of complication, not stated as uncontrolled   . Unspecified sleep apnea   . Fibromyalgia   . Nephrolithiasis     seeing Dr. Jerre Simon  . Anxiety   . Personal history of other diseases of digestive system   . Palpitations   . Other and unspecified hyperlipidemia   . HTN (hypertension)   . Chest pain, unspecified   . Dyspnea   . Colonoscopy causing post-procedural bleeding     incomplete. by Dr. Jena Gauss 06/11/99 due to fixed non-compliant colon precluded exam to 40 cm, she had subsequent colectomy for diverticulitis since that time.   . Chronic back pain   . Hiatal hernia     recent EGD/TCS showed small hiatal hernia, normal apperaring tubular esophagus. s/p passage of a 54-french Maloney dilator, s/p biopsy esophageal mucosa, multiple fundal gland type gastric polyps. one large pedunculated polp with oozing, noted s/p clipping and snare polypectomy. Biopsies of the gastric mucosa taken given her symptoms in her elevated count. normal D1-D3, s/p biospy D2-D3.   [name] Hiatal hernia     all bx unremarkable. on TCS she had left-sided diverticula, evidence of prior segmental resection with anastomosis, multiple colonic polyps s/p multiple snare polypectomies, s/p segmental biopsy and stool sampling. she had multiple tubular adenomas. no microscopic/collagenous colitis. stool culture, c diff, O+P, lactoferrin were negative.   . Ureteral stent retained     Not retained. ureteral stent removal gross hematuria resolved 10/11. coumadin restarted.     Past Surgical History  Procedure Date  . Total abdominal hysterectomy   . Tonsillectomy   . Colectomy     2002. Dr. Salvadore Dom for diverticulitis    Family History  Problem Relation Age of Onset  . Coronary artery disease      family Hx     History   Social  History  . Marital Status: Widowed    Spouse Name: N/A    Number of Children: N/A  . Years of Education: N/A   Occupational History  . Not on file.   Social History Main Topics  . Smoking status: Never Smoker   . Smokeless tobacco: Never Used   Comment: tobacco use - no  . Alcohol Use: No  . Drug Use: No  . Sexually Active: Not on file   Other Topics Concern  . Not on file   Social History Narrative   Retired-[employer],  does not get regular exercise. Widowed, 4 living children (lost 1 son at age 67-MVA), lives with 2 sons    [id number] positives as outlined above. The remainder of the 18  point review of systems is negative   PHYSICAL EXAM BP 124/66  Pulse 77  Ht 5\' 3"  (1.6 m)  Wt 224 lb (101.606 kg)  BMI 39.68 kg/m2  General: Well-developed, well-nourished in no distress Head: Normocephalic and atraumatic Eyes:PERRLA/EOMI intact, conjunctiva and lids normal Ears: No deformity or lesions Mouth:normal dentition, normal posterior pharynx Neck: Supple, no JVD.  No masses, thyromegaly or abnormal cervical nodes Lungs: Normal breath sounds bilaterally without wheezing.  Normal percussion Cardiac: Irregular rate and rhythm with normal S1 and S2, no S3 or S4.  PMI is normal.  No pathological murmurs Abdomen: Normal bowel sounds, abdomen is soft and nontender without masses, organomegaly or hernias noted.  No hepatosplenomegaly MSK: Back normal, normal gait muscle strength and tone normal Vascular: Pulse is normal in all 4 extremities Extremities: No peripheral pitting edema Neurologic: Alert and oriented x 3 Skin: Intact without lesions or rashes Lymphatics: No significant adenopathyPsychologic: Normal affect   ECG: Not available  ASSESSMENT AND PLAN

## 2010-10-31 ENCOUNTER — Other Ambulatory Visit: Payer: Self-pay | Admitting: Family Medicine

## 2010-11-20 ENCOUNTER — Ambulatory Visit (INDEPENDENT_AMBULATORY_CARE_PROVIDER_SITE_OTHER): Payer: Medicare Other | Admitting: *Deleted

## 2010-11-20 DIAGNOSIS — Z7901 Long term (current) use of anticoagulants: Secondary | ICD-10-CM

## 2010-11-20 DIAGNOSIS — I4891 Unspecified atrial fibrillation: Secondary | ICD-10-CM

## 2010-12-18 ENCOUNTER — Ambulatory Visit (INDEPENDENT_AMBULATORY_CARE_PROVIDER_SITE_OTHER): Payer: Medicare Other | Admitting: *Deleted

## 2010-12-18 DIAGNOSIS — Z7901 Long term (current) use of anticoagulants: Secondary | ICD-10-CM

## 2010-12-18 DIAGNOSIS — I4891 Unspecified atrial fibrillation: Secondary | ICD-10-CM

## 2010-12-18 LAB — POCT INR: INR: 2.8

## 2011-01-11 LAB — POCT I-STAT 3, ART BLOOD GAS (G3+)
Bicarbonate: 31.3 — ABNORMAL HIGH
Operator id: 274661
TCO2: 33
pCO2 arterial: 42.9
pH, Arterial: 7.471 — ABNORMAL HIGH

## 2011-01-11 LAB — POCT I-STAT 3, VENOUS BLOOD GAS (G3P V)
Acid-Base Excess: 4 — ABNORMAL HIGH
Bicarbonate: 25.7 — ABNORMAL HIGH
O2 Saturation: 71
Operator id: 274661
TCO2: 31
pCO2, Ven: 46.4
pO2, Ven: 37
pO2, Ven: 37

## 2011-01-11 LAB — CBC
Hemoglobin: 12.3
RBC: 4.34
RDW: 14.6

## 2011-01-11 LAB — TSH: TSH: 0.423

## 2011-01-11 LAB — BASIC METABOLIC PANEL
Calcium: 9
GFR calc Af Amer: >60
GFR calc non Af Amer: 60 — ABNORMAL LOW
Sodium: 136

## 2011-01-11 LAB — T4, FREE: Free T4: 1.11

## 2011-01-11 LAB — LIPID PANEL
LDL Cholesterol: 81
Triglycerides: 149

## 2011-01-15 ENCOUNTER — Ambulatory Visit (INDEPENDENT_AMBULATORY_CARE_PROVIDER_SITE_OTHER): Payer: Medicare Other | Admitting: *Deleted

## 2011-01-15 DIAGNOSIS — I4891 Unspecified atrial fibrillation: Secondary | ICD-10-CM

## 2011-01-15 DIAGNOSIS — Z7901 Long term (current) use of anticoagulants: Secondary | ICD-10-CM

## 2011-01-15 LAB — POCT INR: INR: 2.8

## 2011-01-24 ENCOUNTER — Other Ambulatory Visit: Payer: Self-pay | Admitting: *Deleted

## 2011-01-24 MED ORDER — WARFARIN SODIUM 5 MG PO TABS: 5.0000 mg | ORAL_TABLET | ORAL | Status: DC

## 2011-02-12 ENCOUNTER — Ambulatory Visit (INDEPENDENT_AMBULATORY_CARE_PROVIDER_SITE_OTHER): Payer: Medicare Other | Admitting: *Deleted

## 2011-02-12 DIAGNOSIS — I4891 Unspecified atrial fibrillation: Secondary | ICD-10-CM

## 2011-02-12 DIAGNOSIS — Z7901 Long term (current) use of anticoagulants: Secondary | ICD-10-CM

## 2011-03-12 ENCOUNTER — Ambulatory Visit (INDEPENDENT_AMBULATORY_CARE_PROVIDER_SITE_OTHER): Payer: Medicare Other | Admitting: *Deleted

## 2011-03-12 DIAGNOSIS — Z7901 Long term (current) use of anticoagulants: Secondary | ICD-10-CM

## 2011-03-12 DIAGNOSIS — I4891 Unspecified atrial fibrillation: Secondary | ICD-10-CM

## 2011-03-12 LAB — POCT INR: INR: 1.9

## 2011-04-12 ENCOUNTER — Ambulatory Visit (INDEPENDENT_AMBULATORY_CARE_PROVIDER_SITE_OTHER): Payer: Medicare Other | Admitting: *Deleted

## 2011-04-12 DIAGNOSIS — Z7901 Long term (current) use of anticoagulants: Secondary | ICD-10-CM | POA: Diagnosis not present

## 2011-04-12 DIAGNOSIS — I4891 Unspecified atrial fibrillation: Secondary | ICD-10-CM

## 2011-04-12 LAB — POCT INR: INR: 1.9

## 2011-04-18 DIAGNOSIS — G43909 Migraine, unspecified, not intractable, without status migrainosus: Secondary | ICD-10-CM | POA: Diagnosis not present

## 2011-04-18 DIAGNOSIS — I4891 Unspecified atrial fibrillation: Secondary | ICD-10-CM | POA: Diagnosis not present

## 2011-05-10 ENCOUNTER — Ambulatory Visit (INDEPENDENT_AMBULATORY_CARE_PROVIDER_SITE_OTHER): Payer: Medicare Other | Admitting: *Deleted

## 2011-05-10 DIAGNOSIS — I4891 Unspecified atrial fibrillation: Secondary | ICD-10-CM | POA: Diagnosis not present

## 2011-05-10 DIAGNOSIS — Z7901 Long term (current) use of anticoagulants: Secondary | ICD-10-CM

## 2011-05-10 LAB — POCT INR: INR: 2.7

## 2011-05-14 DIAGNOSIS — IMO0001 Reserved for inherently not codable concepts without codable children: Secondary | ICD-10-CM | POA: Diagnosis not present

## 2011-05-14 DIAGNOSIS — G4700 Insomnia, unspecified: Secondary | ICD-10-CM | POA: Diagnosis not present

## 2011-05-14 DIAGNOSIS — G479 Sleep disorder, unspecified: Secondary | ICD-10-CM | POA: Diagnosis not present

## 2011-05-14 DIAGNOSIS — Z79899 Other long term (current) drug therapy: Secondary | ICD-10-CM | POA: Diagnosis not present

## 2011-05-20 DIAGNOSIS — R55 Syncope and collapse: Secondary | ICD-10-CM | POA: Diagnosis not present

## 2011-05-20 DIAGNOSIS — IMO0001 Reserved for inherently not codable concepts without codable children: Secondary | ICD-10-CM | POA: Diagnosis not present

## 2011-05-23 DIAGNOSIS — E1165 Type 2 diabetes mellitus with hyperglycemia: Secondary | ICD-10-CM | POA: Diagnosis not present

## 2011-05-23 DIAGNOSIS — I10 Essential (primary) hypertension: Secondary | ICD-10-CM | POA: Diagnosis not present

## 2011-05-23 DIAGNOSIS — E669 Obesity, unspecified: Secondary | ICD-10-CM | POA: Diagnosis not present

## 2011-05-23 DIAGNOSIS — E1129 Type 2 diabetes mellitus with other diabetic kidney complication: Secondary | ICD-10-CM | POA: Diagnosis not present

## 2011-05-23 DIAGNOSIS — E559 Vitamin D deficiency, unspecified: Secondary | ICD-10-CM | POA: Diagnosis not present

## 2011-05-23 DIAGNOSIS — E785 Hyperlipidemia, unspecified: Secondary | ICD-10-CM | POA: Diagnosis not present

## 2011-05-27 DIAGNOSIS — E11339 Type 2 diabetes mellitus with moderate nonproliferative diabetic retinopathy without macular edema: Secondary | ICD-10-CM | POA: Diagnosis not present

## 2011-05-27 DIAGNOSIS — H4050X0 Glaucoma secondary to other eye disorders, unspecified eye, stage unspecified: Secondary | ICD-10-CM | POA: Diagnosis not present

## 2011-05-27 DIAGNOSIS — E11311 Type 2 diabetes mellitus with unspecified diabetic retinopathy with macular edema: Secondary | ICD-10-CM | POA: Diagnosis not present

## 2011-05-27 DIAGNOSIS — E1139 Type 2 diabetes mellitus with other diabetic ophthalmic complication: Secondary | ICD-10-CM | POA: Diagnosis not present

## 2011-05-27 DIAGNOSIS — H353190 Nonexudative age-related macular degeneration, unspecified eye, stage unspecified: Secondary | ICD-10-CM | POA: Diagnosis not present

## 2011-05-27 DIAGNOSIS — H35059 Retinal neovascularization, unspecified, unspecified eye: Secondary | ICD-10-CM | POA: Diagnosis not present

## 2011-05-30 ENCOUNTER — Other Ambulatory Visit: Payer: Self-pay | Admitting: Cardiology

## 2011-05-30 DIAGNOSIS — E1129 Type 2 diabetes mellitus with other diabetic kidney complication: Secondary | ICD-10-CM | POA: Diagnosis not present

## 2011-05-30 DIAGNOSIS — E559 Vitamin D deficiency, unspecified: Secondary | ICD-10-CM | POA: Diagnosis not present

## 2011-05-30 DIAGNOSIS — E785 Hyperlipidemia, unspecified: Secondary | ICD-10-CM | POA: Diagnosis not present

## 2011-06-07 ENCOUNTER — Ambulatory Visit (INDEPENDENT_AMBULATORY_CARE_PROVIDER_SITE_OTHER): Payer: Medicare Other | Admitting: *Deleted

## 2011-06-07 DIAGNOSIS — Z7901 Long term (current) use of anticoagulants: Secondary | ICD-10-CM

## 2011-06-07 DIAGNOSIS — I4891 Unspecified atrial fibrillation: Secondary | ICD-10-CM

## 2011-06-14 ENCOUNTER — Other Ambulatory Visit: Payer: Self-pay | Admitting: *Deleted

## 2011-06-14 MED ORDER — CLONIDINE HCL 0.1 MG/24HR TD PTWK: 1.0000 | MEDICATED_PATCH | TRANSDERMAL | Status: DC

## 2011-06-18 ENCOUNTER — Telehealth: Payer: Self-pay | Admitting: *Deleted

## 2011-06-18 NOTE — Telephone Encounter (Signed)
Message left on voice mail - had a coumadin question.  Returned call - stated she is having tooth extraction done on Thursday.  Informed her that Dr. Andee Lineman typically does not advise holding  Coumadin as long as no more than 2 teeth being done at a time.  She stated that she would call her dentist today to let him know.  She can have them call / fax if needing further information.

## 2011-07-02 ENCOUNTER — Ambulatory Visit (INDEPENDENT_AMBULATORY_CARE_PROVIDER_SITE_OTHER): Payer: Medicare Other | Admitting: *Deleted

## 2011-07-02 DIAGNOSIS — G479 Sleep disorder, unspecified: Secondary | ICD-10-CM | POA: Diagnosis not present

## 2011-07-02 DIAGNOSIS — I4891 Unspecified atrial fibrillation: Secondary | ICD-10-CM

## 2011-07-02 DIAGNOSIS — Z7901 Long term (current) use of anticoagulants: Secondary | ICD-10-CM | POA: Diagnosis not present

## 2011-07-02 DIAGNOSIS — G4700 Insomnia, unspecified: Secondary | ICD-10-CM | POA: Diagnosis not present

## 2011-07-02 DIAGNOSIS — G43909 Migraine, unspecified, not intractable, without status migrainosus: Secondary | ICD-10-CM | POA: Diagnosis not present

## 2011-07-02 DIAGNOSIS — Z79899 Other long term (current) drug therapy: Secondary | ICD-10-CM | POA: Diagnosis not present

## 2011-07-02 DIAGNOSIS — IMO0001 Reserved for inherently not codable concepts without codable children: Secondary | ICD-10-CM | POA: Diagnosis not present

## 2011-07-30 ENCOUNTER — Ambulatory Visit (INDEPENDENT_AMBULATORY_CARE_PROVIDER_SITE_OTHER): Payer: Medicare Other | Admitting: *Deleted

## 2011-07-30 DIAGNOSIS — I4891 Unspecified atrial fibrillation: Secondary | ICD-10-CM

## 2011-07-30 DIAGNOSIS — Z7901 Long term (current) use of anticoagulants: Secondary | ICD-10-CM | POA: Diagnosis not present

## 2011-07-30 LAB — POCT INR: INR: 2.5

## 2011-08-13 DIAGNOSIS — G479 Sleep disorder, unspecified: Secondary | ICD-10-CM | POA: Diagnosis not present

## 2011-08-13 DIAGNOSIS — G43909 Migraine, unspecified, not intractable, without status migrainosus: Secondary | ICD-10-CM | POA: Diagnosis not present

## 2011-08-13 DIAGNOSIS — Z79899 Other long term (current) drug therapy: Secondary | ICD-10-CM | POA: Diagnosis not present

## 2011-08-13 DIAGNOSIS — IMO0001 Reserved for inherently not codable concepts without codable children: Secondary | ICD-10-CM | POA: Diagnosis not present

## 2011-08-15 DIAGNOSIS — E785 Hyperlipidemia, unspecified: Secondary | ICD-10-CM | POA: Diagnosis not present

## 2011-08-15 DIAGNOSIS — E559 Vitamin D deficiency, unspecified: Secondary | ICD-10-CM | POA: Diagnosis not present

## 2011-08-15 DIAGNOSIS — E1129 Type 2 diabetes mellitus with other diabetic kidney complication: Secondary | ICD-10-CM | POA: Diagnosis not present

## 2011-08-15 DIAGNOSIS — E1165 Type 2 diabetes mellitus with hyperglycemia: Secondary | ICD-10-CM | POA: Diagnosis not present

## 2011-08-22 DIAGNOSIS — E559 Vitamin D deficiency, unspecified: Secondary | ICD-10-CM | POA: Diagnosis not present

## 2011-08-22 DIAGNOSIS — E785 Hyperlipidemia, unspecified: Secondary | ICD-10-CM | POA: Diagnosis not present

## 2011-08-22 DIAGNOSIS — I10 Essential (primary) hypertension: Secondary | ICD-10-CM | POA: Diagnosis not present

## 2011-08-22 DIAGNOSIS — E1129 Type 2 diabetes mellitus with other diabetic kidney complication: Secondary | ICD-10-CM | POA: Diagnosis not present

## 2011-08-22 DIAGNOSIS — E669 Obesity, unspecified: Secondary | ICD-10-CM | POA: Diagnosis not present

## 2011-08-27 ENCOUNTER — Ambulatory Visit (INDEPENDENT_AMBULATORY_CARE_PROVIDER_SITE_OTHER): Payer: Medicare Other | Admitting: *Deleted

## 2011-08-27 DIAGNOSIS — I4891 Unspecified atrial fibrillation: Secondary | ICD-10-CM

## 2011-08-27 DIAGNOSIS — Z7901 Long term (current) use of anticoagulants: Secondary | ICD-10-CM | POA: Diagnosis not present

## 2011-09-12 DIAGNOSIS — G479 Sleep disorder, unspecified: Secondary | ICD-10-CM | POA: Diagnosis not present

## 2011-09-12 DIAGNOSIS — M62838 Other muscle spasm: Secondary | ICD-10-CM | POA: Diagnosis not present

## 2011-09-12 DIAGNOSIS — G43909 Migraine, unspecified, not intractable, without status migrainosus: Secondary | ICD-10-CM | POA: Diagnosis not present

## 2011-09-12 DIAGNOSIS — IMO0001 Reserved for inherently not codable concepts without codable children: Secondary | ICD-10-CM | POA: Diagnosis not present

## 2011-09-12 DIAGNOSIS — Z79899 Other long term (current) drug therapy: Secondary | ICD-10-CM | POA: Diagnosis not present

## 2011-09-24 ENCOUNTER — Ambulatory Visit (INDEPENDENT_AMBULATORY_CARE_PROVIDER_SITE_OTHER): Payer: Medicare Other | Admitting: *Deleted

## 2011-09-24 DIAGNOSIS — I4891 Unspecified atrial fibrillation: Secondary | ICD-10-CM | POA: Diagnosis not present

## 2011-09-24 DIAGNOSIS — Z7901 Long term (current) use of anticoagulants: Secondary | ICD-10-CM

## 2011-09-28 ENCOUNTER — Other Ambulatory Visit: Payer: Self-pay | Admitting: Cardiology

## 2011-10-02 ENCOUNTER — Encounter: Payer: Self-pay | Admitting: Cardiology

## 2011-10-02 ENCOUNTER — Ambulatory Visit (INDEPENDENT_AMBULATORY_CARE_PROVIDER_SITE_OTHER): Payer: Medicare Other | Admitting: Cardiology

## 2011-10-02 VITALS — BP 142/78 | HR 72 | Resp 18 | Ht 63.0 in | Wt 223.0 lb

## 2011-10-02 DIAGNOSIS — N183 Chronic kidney disease, stage 3 unspecified: Secondary | ICD-10-CM

## 2011-10-02 DIAGNOSIS — I4891 Unspecified atrial fibrillation: Secondary | ICD-10-CM | POA: Diagnosis not present

## 2011-10-02 DIAGNOSIS — F411 Generalized anxiety disorder: Secondary | ICD-10-CM

## 2011-10-02 DIAGNOSIS — G4700 Insomnia, unspecified: Secondary | ICD-10-CM | POA: Insufficient documentation

## 2011-10-02 DIAGNOSIS — Z7901 Long term (current) use of anticoagulants: Secondary | ICD-10-CM

## 2011-10-02 MED ORDER — DILTIAZEM HCL ER COATED BEADS 120 MG PO CP24: 120.0000 mg | ORAL_CAPSULE | Freq: Every day | ORAL | Status: DC

## 2011-10-02 MED ORDER — TRAZODONE HCL 50 MG PO TABS: ORAL_TABLET | ORAL | Status: DC

## 2011-10-02 NOTE — Assessment & Plan Note (Signed)
Patient under significant stress because her son is a drug addict.

## 2011-10-02 NOTE — Assessment & Plan Note (Signed)
Not permanent atrial fibrillation the last 2-3 years. Failed cardioversion last year. Rate somewhat poorly controlled and will change Norvasc to Cardizem CD 120 mg a day. We will also update her medication list as the patient is not taking propranolol as is listed. We will continue with rate control and Coumadin anticoagulation.

## 2011-10-02 NOTE — Assessment & Plan Note (Signed)
Followup laboratory work her primary care physician.

## 2011-10-02 NOTE — Progress Notes (Signed)
[name] Bottoms, MD, [facility] ABIM Board Certified in Adult Cardiovascular Medicine,Internal Medicine and Critical Care Medicine    CC: Followup patient with atrial fibrillation  HPI:  The patient in permanent atrial fibrillation. She has failed cardioversion in the past. Rate is still poorly controlled. The patient is under significant stress. Apparently her son is a drug addict and has a lot of difficulty with him. She states when she's under stress his affect her blood sugar as well as her blood pressure. She reports occasional chest tightness but no symptoms consistent with angina. She has no orthopnea PND. She is able to do her ADLs. She reports no presyncope or syncope.  PMH: reviewed and listed in Problem List in Electronic Records (and see below) Past Medical History  Diagnosis Date  . Loss of weight   . Diarrhea   . Esophageal reflux   . Type I (juvenile type) diabetes mellitus without mention of complication, not stated as uncontrolled   . Unspecified sleep apnea   . Fibromyalgia   . Nephrolithiasis     seeing Dr. Jerre Simon  . Anxiety   . Personal history of other diseases of digestive system   . Palpitations   . Other and unspecified hyperlipidemia   . HTN (hypertension)   . Chest pain, unspecified   . Dyspnea   . Colonoscopy causing post-procedural bleeding     incomplete. by Dr. Jena Gauss 06/11/99 due to fixed non-compliant colon precluded exam to 40 cm, she had subsequent colectomy for diverticulitis since that time.   . Chronic back pain   . Hiatal hernia     recent EGD/TCS showed small hiatal hernia, normal apperaring tubular esophagus. s/p passage of a 54-french Maloney dilator, s/p biopsy esophageal mucosa, multiple fundal gland type gastric polyps. one large pedunculated polp with oozing, noted s/p clipping and snare polypectomy. Biopsies of the gastric mucosa taken given her symptoms in her elevated count. normal D1-D3, s/p biospy D2-D3.   [name] Hiatal hernia     all bx  unremarkable. on TCS she had left-sided diverticula, evidence of prior segmental resection with anastomosis, multiple colonic polyps s/p multiple snare polypectomies, s/p segmental biopsy and stool sampling. she had multiple tubular adenomas. no microscopic/collagenous colitis. stool culture, c diff, O+P, lactoferrin were negative.   . Ureteral stent retained     Not retained. ureteral stent removal gross hematuria resolved 10/11. coumadin restarted.    Past Surgical History  Procedure Date  . Total abdominal hysterectomy   . Tonsillectomy   . Colectomy     2002. Dr. Salvadore Dom for diverticulitis    Allergies/SH/FHX : available in Electronic Records for review  Allergies  Allergen Reactions  . Iohexol   . Meperidine Hcl     REACTION: n/v  . Penicillins     REACTION: CAUSES RASH,NAUSEA,VOMITTING   History   Social History  . Marital Status: Widowed    Spouse Name: N/A    Number of Children: N/A  . Years of Education: N/A   Occupational History  . Not on file.   Social History Main Topics  . Smoking status: Never Smoker   . Smokeless tobacco: Never Used   Comment: tobacco use - no  . Alcohol Use: No  . Drug Use: No  . Sexually Active: Not on file   Other Topics Concern  . Not on file   Social History Narrative   Retired-[employer], does not get regular exercise. Widowed, 4 living children (lost 1 son at age 50-MVA), lives with  2 sons   Family History  Problem Relation Age of Onset  . Coronary artery disease      family Hx     Medications: Current Outpatient Prescriptions  Medication Sig Dispense Refill  . acetaminophen (TYLENOL) 325 MG tablet Take 650 mg by mouth every 6 (six) hours as needed. Dosage not specified       . albuterol (PROAIR HFA) 108 (90 BASE) MCG/ACT inhaler Inhale 2 puffs into the lungs every 4 (four) hours as needed.        . ALPRAZolam (XANAX) 1 MG tablet Take 1 mg by mouth 4 (four) times daily as needed.       . bisacodyl (DULCOLAX) 5 MG  EC tablet Take 5 mg by mouth as needed.        [name] BYETTA 10 MCG PEN 10 MCG/0.04ML SOLN Inject 10 mcg into the skin Twice daily.      . cloNIDine (CATAPRES - DOSED IN MG/24 HR) 0.1 mg/24hr patch Place 1 patch (0.1 mg total) onto the skin every 7 (seven) days.  4 patch  6  . esomeprazole (NEXIUM) 40 MG capsule Take 40 mg by mouth 2 (two) times daily.        . hydrochlorothiazide 50 MG tablet Take 50 mg by mouth daily.        . insulin glargine (LANTUS) 100 UNIT/ML injection Inject 40 Units into the skin at bedtime. UAD      . insulin lispro (HUMALOG) 100 UNIT/ML injection Inject into the skin. UAD       . metoprolol (LOPRESSOR) 50 MG tablet       . nitroGLYCERIN (NITROSTAT) 0.4 MG SL tablet Place 0.4 mg under the tongue every 5 (five) minutes as needed.        [name] oxyCODONE (ROXICODONE) 15 MG immediate release tablet Take 15 mg by mouth every 6 (six) hours as needed.       . rosuvastatin (CRESTOR) 10 MG tablet Take 10 mg by mouth daily.        . tizanidine (ZANAFLEX) 2 MG capsule Take 2 mg by mouth 3 (three) times daily as needed.       . warfarin (COUMADIN) 5 MG tablet TAKE ONE TABLET AS DIRECTED  30 tablet  6  . zolpidem (AMBIEN) 10 MG tablet Take 10 mg by mouth at bedtime as needed.        [name] DISCONTD: metoprolol (LOPRESSOR) 50 MG tablet TAKE ONE TABLET EVERY MORNING AND TAKE 1/2 TABLET AT BEDTIME  45 tablet  3  . diltiazem (CARDIZEM CD) 120 MG 24 hr capsule Take 1 capsule (120 mg total) by mouth daily.  30 capsule  6  . traZODone (DESYREL) 50 MG tablet May take 1 tab (50mg ) every evening as needed for sleep, may increase to 100mg  if needed.  60 tablet  2    ROS: No nausea or vomiting. No fever or chills.No melena or hematochezia.No bleeding.No claudication  Physical Exam: BP 142/78  Pulse 72  Resp 18  Ht 5\' 3"  (1.6 m)  Wt 223 lb (101.152 kg)  BMI 39.50 kg/m2 General: Overweight white female somewhat anxious appearing Neck: Normal carotid upstroke no carotid bruits. No thyromegaly  nonnodular thyroid. JVP is 7-8 cm Lungs: Diminished breath sounds bilaterally but no wheezing. Cardiac: Irregular rate and rhythm with normal S1-S2. No murmurs rubs or gallops.  Vascular: No edema. Normal distal pulses. Skin: Warm and dry Physcologic: Normal affect  12lead ECG: Single lead tracing demonstrates a heart rate of  110 beats per minute in atrial fibrillation Limited bedside ECHO:N/A No images are attached to the encounter.   Assessment and Plan  ANXIETY Patient under significant stress because her son is a drug addict.  ATRIAL FIBRILLATION Not permanent atrial fibrillation the last 2-3 years. Failed cardioversion last year. Rate somewhat poorly controlled and will change Norvasc to Cardizem CD 120 mg a day. We will also update her medication list as the patient is not taking propranolol as is listed. We will continue with rate control and Coumadin anticoagulation.  Encounter for long-term (current) use of anticoagulants Tolerating anticoagulation with no bleeding.  RENAL DISEASE, CHRONIC, STAGE III Followup laboratory work her primary care physician.  Insomnia I told patient not to take Ambien. I've given her a prescription of trazodone that she can increase from 50-100 mg by mouth each bedtime as needed.   Patient Active Problem List  Diagnosis  . DM  . IDDM  . HYPERLIPIDEMIA-MIXED  . HYPERKALEMIA  . ANEMIA, IRON DEFICIENCY, MICROCYTIC  . ANXIETY  . HYPERTENSION, UNSPECIFIED  . CAD  . ATRIAL FIBRILLATION  . GERD  . IBS  . RENAL DISEASE, CHRONIC, STAGE III  . HYDRONEPHROSIS, RIGHT  . NEPHROLITHIASIS  . GROSS HEMATURIA  . BACK PAIN, CHRONIC  . FIBROMYALGIA  . SLEEP APNEA  . PALPITATIONS  . DYSPNEA  . CHEST PAIN-UNSPECIFIED  . DIARRHEA, CHRONIC  . ABDOMINAL PAIN, GENERALIZED  . ECHOCARDIOGRAM, ABNORMAL  . DIVERTICULITIS, HX OF  . Encounter for long-term (current) use of anticoagulants  . Insomnia

## 2011-10-02 NOTE — Assessment & Plan Note (Signed)
Tolerating anticoagulation with no bleeding.

## 2011-10-02 NOTE — Assessment & Plan Note (Signed)
I told patient not to take Ambien. I've given her a prescription of trazodone that she can increase from 50-100 mg by mouth each bedtime as needed.

## 2011-10-02 NOTE — Patient Instructions (Signed)
   Stop Norvasc   Change to Cardizem CD 120mg  daily  Trazodone 50mg  every evening as needed for sleep, may increase to 100mg  if needed Your physician wants you to follow up in: 6 months.  You will receive a reminder letter in the mail one-two months in advance.  If you don't receive a letter, please call our office to schedule the follow up appointment

## 2011-10-14 DIAGNOSIS — Z79899 Other long term (current) drug therapy: Secondary | ICD-10-CM | POA: Diagnosis not present

## 2011-10-14 DIAGNOSIS — M62838 Other muscle spasm: Secondary | ICD-10-CM | POA: Diagnosis not present

## 2011-10-14 DIAGNOSIS — IMO0001 Reserved for inherently not codable concepts without codable children: Secondary | ICD-10-CM | POA: Diagnosis not present

## 2011-10-14 DIAGNOSIS — G43909 Migraine, unspecified, not intractable, without status migrainosus: Secondary | ICD-10-CM | POA: Diagnosis not present

## 2011-10-22 ENCOUNTER — Ambulatory Visit (INDEPENDENT_AMBULATORY_CARE_PROVIDER_SITE_OTHER): Payer: Medicare Other | Admitting: *Deleted

## 2011-10-22 DIAGNOSIS — Z7901 Long term (current) use of anticoagulants: Secondary | ICD-10-CM

## 2011-10-22 DIAGNOSIS — I4891 Unspecified atrial fibrillation: Secondary | ICD-10-CM

## 2011-10-29 ENCOUNTER — Other Ambulatory Visit: Payer: Self-pay | Admitting: Cardiology

## 2011-11-12 DIAGNOSIS — G43909 Migraine, unspecified, not intractable, without status migrainosus: Secondary | ICD-10-CM | POA: Diagnosis not present

## 2011-11-12 DIAGNOSIS — K219 Gastro-esophageal reflux disease without esophagitis: Secondary | ICD-10-CM | POA: Diagnosis not present

## 2011-11-12 DIAGNOSIS — M62838 Other muscle spasm: Secondary | ICD-10-CM | POA: Diagnosis not present

## 2011-11-12 DIAGNOSIS — Z79899 Other long term (current) drug therapy: Secondary | ICD-10-CM | POA: Diagnosis not present

## 2011-11-12 DIAGNOSIS — IMO0001 Reserved for inherently not codable concepts without codable children: Secondary | ICD-10-CM | POA: Diagnosis not present

## 2011-11-14 DIAGNOSIS — E559 Vitamin D deficiency, unspecified: Secondary | ICD-10-CM | POA: Diagnosis not present

## 2011-11-14 DIAGNOSIS — E1165 Type 2 diabetes mellitus with hyperglycemia: Secondary | ICD-10-CM | POA: Diagnosis not present

## 2011-11-14 DIAGNOSIS — E785 Hyperlipidemia, unspecified: Secondary | ICD-10-CM | POA: Diagnosis not present

## 2011-11-14 DIAGNOSIS — E1129 Type 2 diabetes mellitus with other diabetic kidney complication: Secondary | ICD-10-CM | POA: Diagnosis not present

## 2011-11-14 DIAGNOSIS — E669 Obesity, unspecified: Secondary | ICD-10-CM | POA: Diagnosis not present

## 2011-11-14 DIAGNOSIS — I10 Essential (primary) hypertension: Secondary | ICD-10-CM | POA: Diagnosis not present

## 2011-11-15 ENCOUNTER — Encounter: Payer: Self-pay | Admitting: Internal Medicine

## 2011-11-18 ENCOUNTER — Ambulatory Visit (INDEPENDENT_AMBULATORY_CARE_PROVIDER_SITE_OTHER): Payer: Medicare Other | Admitting: Gastroenterology

## 2011-11-18 ENCOUNTER — Other Ambulatory Visit: Payer: Self-pay | Admitting: Internal Medicine

## 2011-11-18 ENCOUNTER — Encounter: Payer: Self-pay | Admitting: Gastroenterology

## 2011-11-18 VITALS — BP 140/80 | HR 51 | Temp 97.9°F | Ht 63.0 in | Wt 219.8 lb

## 2011-11-18 DIAGNOSIS — D369 Benign neoplasm, unspecified site: Secondary | ICD-10-CM | POA: Diagnosis not present

## 2011-11-18 DIAGNOSIS — R1013 Epigastric pain: Secondary | ICD-10-CM

## 2011-11-18 DIAGNOSIS — K589 Irritable bowel syndrome without diarrhea: Secondary | ICD-10-CM | POA: Diagnosis not present

## 2011-11-18 DIAGNOSIS — K3189 Other diseases of stomach and duodenum: Secondary | ICD-10-CM

## 2011-11-18 NOTE — Progress Notes (Signed)
Referring Provider: Halm, Steven J, MD Primary Care Physician:  HALM, STEVEN, MD Primary Gastroenterologist: Dr. [name]   Chief Complaint  Patient presents with  . Nausea    HPI:   77-year-old female with hx of IBS, chronic diarrhea, GERD, abdominal pain, hx of adenomatous polyps. Due for surveillance colonoscopy this year. Multiple concerns today.  States wakes up in am, all she can eat is crackers. Nausea all the time. Has worsened in frequency, intensity. Hasn't been this sick since she was pregnant. No vomiting. No pain with eating. Has lost several lbs in last 2 weeks. +chronic reflux. On Nexium, helps with indigestion. Early satiety. Once in awhile esophageal dysphagia. +pill dysphagia.  Upper abdominal "burning", "gnawing" for several months. No NSAIDs. Will have diarrhea for several days then constipated several days. Chronic. Eating dried banana chips to help with constipation. No blood in stool. Cramping with diarrhea. Tries to follow low fat diet, stay away from dairy.   On Warfarin for afib.   Past Medical History  Diagnosis Date  . Loss of weight   . Diarrhea   . Esophageal reflux   . Unspecified sleep apnea   . Fibromyalgia   . Nephrolithiasis     seeing Dr. Javaid  . Anxiety   . Personal history of other diseases of digestive system   . Palpitations   . Other and unspecified hyperlipidemia   . HTN (hypertension)   . Chest pain, unspecified   . Dyspnea   . Colonoscopy causing post-procedural bleeding     incomplete. by Dr. [name] 06/11/99 due to fixed non-compliant colon precluded exam to 40 cm, she had subsequent colectomy for diverticulitis since that time.   . Chronic back pain   . Hiatal hernia     recent EGD/TCS showed small hiatal hernia, normal apperaring tubular esophagus. s/p passage of a 54-french Maloney dilator, s/p biopsy esophageal mucosa, multiple fundal gland type gastric polyps. one large pedunculated polp with oozing, noted s/p clipping and snare  polypectomy. Biopsies of the gastric mucosa taken given her symptoms in her elevated count. normal D1-D3, s/p biospy D2-D3.   . Hiatal hernia     all bx unremarkable. on TCS she had left-sided diverticula, evidence of prior segmental resection with anastomosis, multiple colonic polyps s/p multiple snare polypectomies, s/p segmental biopsy and stool sampling. she had multiple tubular adenomas. no microscopic/collagenous colitis. stool culture, c diff, O+P, lactoferrin were negative.   . Ureteral stent retained     Not retained. ureteral stent removal gross hematuria resolved 10/11. coumadin restarted.   . Diabetes mellitus   . A-fib     on chronic warfarin    Past Surgical History  Procedure Date  . Total abdominal hysterectomy   . Tonsillectomy   . Colectomy     2002. Dr. DeStephano for diverticulitis  . Esophagogastroduodenoscopy 02/22/09    normal s/p dilator;small HH/one large pedunculates polyp  s/p clipping, hyperplastic  . Colonoscopy 02/22/09    normal/left-sided diverticula/multiple colonic polyp, adenomatous  . Breast lumpectomy     benign cyst    Current Outpatient Prescriptions  Medication Sig Dispense Refill  . acetaminophen (TYLENOL) 325 MG tablet Take 650 mg by mouth every 6 (six) hours as needed. Dosage not specified       . albuterol (PROAIR HFA) 108 (90 BASE) MCG/ACT inhaler Inhale 2 puffs into the lungs every 4 (four) hours as needed.        . ALPRAZolam (XANAX) 1 MG tablet Take 1 mg by mouth 4 (  four) times daily as needed.       . amLODipine (NORVASC) 10 MG tablet Take 10 mg by mouth daily.       . BYETTA 10 MCG PEN 10 MCG/0.04ML SOLN Inject 10 mcg into the skin Twice daily.      . cloNIDine (CATAPRES - DOSED IN MG/24 HR) 0.1 mg/24hr patch Place 1 patch (0.1 mg total) onto the skin every 7 (seven) days.  4 patch  6  . esomeprazole (NEXIUM) 40 MG capsule Take 40 mg by mouth 2 (two) times daily.        . hydrochlorothiazide 50 MG tablet Take 50 mg by mouth daily.        . insulin glargine (LANTUS) 100 UNIT/ML injection Inject 50 Units into the skin at bedtime. UAD      . insulin lispro (HUMALOG) 100 UNIT/ML injection Inject into the skin. UAD      . metoprolol (LOPRESSOR) 50 MG tablet Take 50 mg by mouth 2 (two) times daily.       . nitroGLYCERIN (NITROSTAT) 0.4 MG SL tablet Place 0.4 mg under the tongue every 5 (five) minutes as needed.        . ondansetron (ZOFRAN-ODT) 4 MG disintegrating tablet Take 4 mg by mouth every 8 (eight) hours as needed.       . oxyCODONE (ROXICODONE) 15 MG immediate release tablet Take 15 mg by mouth every 6 (six) hours as needed.       . simvastatin (ZOCOR) 10 MG tablet Take 10 mg by mouth at bedtime.      . tizanidine (ZANAFLEX) 2 MG capsule Take 2 mg by mouth 3 (three) times daily as needed.       . warfarin (COUMADIN) 5 MG tablet       . zolpidem (AMBIEN) 10 MG tablet Take 10 mg by mouth at bedtime as needed.        . bisacodyl (DULCOLAX) 5 MG EC tablet Take 5 mg by mouth as needed.        . diltiazem (CARDIZEM CD) 120 MG 24 hr capsule Take 1 capsule (120 mg total) by mouth daily.  30 capsule  6  . rosuvastatin (CRESTOR) 10 MG tablet Take 10 mg by mouth daily.        . traZODone (DESYREL) 50 MG tablet May take 1 tab (50mg) every evening as needed for sleep, may increase to 100mg if needed.  60 tablet  2    Allergies as of 11/18/2011 - Review Complete 11/18/2011  Allergen Reaction Noted  . Iohexol  03/08/2004  . Meperidine hcl    . Penicillins      Family History  Problem Relation Age of Onset  . Coronary artery disease      family Hx   . Colon cancer Maternal Grandfather     History   Social History  . Marital Status: Widowed    Spouse Name: N/A    Number of Children: N/A  . Years of Education: N/A   Social History Main Topics  . Smoking status: Never Smoker   . Smokeless tobacco: Never Used   Comment: tobacco use - no  . Alcohol Use: No  . Drug Use: No  . Sexually Active: None   Other Topics Concern    . None   Social History Narrative   Retired-[employer], does not get regular exercise. Widowed, 4 living children (lost 1 son at age 24-MVA), lives with 2 sons    Review of Systems:   Gen: SEE HPI CV: Denies chest pain, palpitations, syncope, peripheral edema, and claudication. Resp: Denies dyspnea at rest, cough, wheezing, coughing up blood, and pleurisy. GI: SEE HPI  Derm: Denies rash, itching, dry skin Psych: Denies depression, anxiety, memory loss, confusion. No homicidal or suicidal ideation.  Heme: Denies bruising, bleeding, and enlarged lymph nodes.  Physical Exam: BP 140/80  Pulse 51  Temp 97.9 F (36.6 C) (Temporal)  Ht 5' 3" (1.6 m)  Wt 219 lb 12.8 oz (99.701 kg)  BMI 38.94 kg/m2 General:   Alert and oriented. No distress noted. Pleasant and cooperative.  Head:  Normocephalic and atraumatic. Eyes:  Conjuctiva clear without scleral icterus. Mouth:  Oral mucosa pink and moist. Good dentition. No lesions. Neck:  Supple, without mass or thyromegaly. Heart:  S1, S2 present without murmurs, rubs, or gallops. Regular rate and rhythm. Abdomen:  +BS, soft, TTP epigastric region and non-distended. No rebound or guarding. Large ventral hernia.  Msk:  Symmetrical without gross deformities. Normal posture. Extremities:  Trace edema bilateral lower extremities Neurologic:  Alert and  oriented x4;  grossly normal neurologically. Skin:  Intact without significant lesions or rashes. Cervical Nodes:  No significant cervical adenopathy. Psych:  Alert and cooperative. Normal mood and affect.  

## 2011-11-18 NOTE — Patient Instructions (Addendum)
We have set you up for an upper endoscopy with Dr. Jena Gauss in the near future.  We will call you regarding the instructions for Coumadin.   Take 1/2 dose of Lantus the evening before the procedure. NO diabetes medications the day of the procedure.

## 2011-11-21 ENCOUNTER — Encounter: Payer: Self-pay | Admitting: Gastroenterology

## 2011-11-21 ENCOUNTER — Telehealth: Payer: Self-pay | Admitting: Gastroenterology

## 2011-11-21 DIAGNOSIS — R109 Unspecified abdominal pain: Secondary | ICD-10-CM | POA: Insufficient documentation

## 2011-11-21 DIAGNOSIS — R1013 Epigastric pain: Secondary | ICD-10-CM | POA: Insufficient documentation

## 2011-11-21 DIAGNOSIS — D369 Benign neoplasm, unspecified site: Secondary | ICD-10-CM | POA: Insufficient documentation

## 2011-11-21 DIAGNOSIS — IMO0001 Reserved for inherently not codable concepts without codable children: Secondary | ICD-10-CM | POA: Diagnosis not present

## 2011-11-21 DIAGNOSIS — E669 Obesity, unspecified: Secondary | ICD-10-CM | POA: Diagnosis not present

## 2011-11-21 DIAGNOSIS — E785 Hyperlipidemia, unspecified: Secondary | ICD-10-CM | POA: Diagnosis not present

## 2011-11-21 DIAGNOSIS — I10 Essential (primary) hypertension: Secondary | ICD-10-CM | POA: Diagnosis not present

## 2011-11-21 NOTE — Assessment & Plan Note (Addendum)
Epigastric burning, gnawing for several months. Early satiety, wt loss of 14 lbs since 2011. No NSAIDs. Esophageal dysphagia, chronic nausea without vomiting. Last EGD in 2010 with empiric dilation. Differentials include gastritis, PUD, IBS, ?chronic abdominal pain, possible gastroparesis with chronic nausea and early satiety.   Proceed with upper endoscopy/dilation in the near future with Dr. Jena Gauss. The risks, benefits, and alternatives have been discussed in detail with patient. They have stated understanding and desire to proceed. PHENERGAN 12.5 mg on call due to polypharmacy.  Anticipate holding Coumadin X 3 days: checking with cardiology (afib) (OK PER CARDIOLOGY, 8/21) Continue PPI Consider GES if unrevealing due to persistent nausea (?diabetic gastroparesis)

## 2011-11-21 NOTE — Assessment & Plan Note (Signed)
Due for surveillance colonoscopy, 3 year follow-up.   Proceed with TCS with Dr. Jena Gauss in near future: the risks, benefits, and alternatives have been discussed with the patient in detail. The patient states understanding and desires to proceed. Phenergan 12.5 COUMADIN: Anticipate holding X 3 days, will check with cardiology for confirmation. (afib).

## 2011-11-21 NOTE — Assessment & Plan Note (Signed)
Pt at baseline.

## 2011-11-21 NOTE — Telephone Encounter (Signed)
I did not realize at time of appointment that she is due for surveillance colonoscopy this year, hx of adenomatous polyps.  Please see if we can add on a TCS at time of EGD. Pt needs to be notified.

## 2011-11-21 NOTE — Progress Notes (Unsigned)
Pt scheduled for EGD 9/9 with RMR On Coumadin. Please find out if we can hold X 3 days. I don't think she will need Lovenox bridging, but I'd like confirmation from cardiology.

## 2011-11-21 NOTE — Progress Notes (Signed)
Routing to [employer] RN for review.

## 2011-11-22 ENCOUNTER — Other Ambulatory Visit: Payer: Self-pay | Admitting: Internal Medicine

## 2011-11-22 DIAGNOSIS — R1310 Dysphagia, unspecified: Secondary | ICD-10-CM

## 2011-11-22 DIAGNOSIS — Z1211 Encounter for screening for malignant neoplasm of colon: Secondary | ICD-10-CM

## 2011-11-22 DIAGNOSIS — Z86010 Personal history of colonic polyps: Secondary | ICD-10-CM

## 2011-11-22 MED ORDER — PEG 3350-KCL-NA BICARB-NACL 420 G PO SOLR: 4000.0000 L | ORAL | Status: AC

## 2011-11-22 NOTE — Telephone Encounter (Signed)
Patient is scheduled for TCS with EGD/ED and instructions were mailed to the patient and patient is aware. Prep Rx was sent to the pharmacy.

## 2011-11-22 NOTE — Progress Notes (Signed)
Faxed to PCP

## 2011-11-25 DIAGNOSIS — E119 Type 2 diabetes mellitus without complications: Secondary | ICD-10-CM | POA: Diagnosis not present

## 2011-11-25 DIAGNOSIS — H353190 Nonexudative age-related macular degeneration, unspecified eye, stage unspecified: Secondary | ICD-10-CM | POA: Diagnosis not present

## 2011-11-25 DIAGNOSIS — E11339 Type 2 diabetes mellitus with moderate nonproliferative diabetic retinopathy without macular edema: Secondary | ICD-10-CM | POA: Diagnosis not present

## 2011-11-25 DIAGNOSIS — H43819 Vitreous degeneration, unspecified eye: Secondary | ICD-10-CM | POA: Diagnosis not present

## 2011-11-26 ENCOUNTER — Ambulatory Visit (INDEPENDENT_AMBULATORY_CARE_PROVIDER_SITE_OTHER): Payer: Medicare Other | Admitting: *Deleted

## 2011-11-26 DIAGNOSIS — Z7901 Long term (current) use of anticoagulants: Secondary | ICD-10-CM

## 2011-11-26 DIAGNOSIS — I4891 Unspecified atrial fibrillation: Secondary | ICD-10-CM

## 2011-11-26 LAB — POCT INR: INR: 3

## 2011-11-27 ENCOUNTER — Other Ambulatory Visit: Payer: Self-pay | Admitting: Internal Medicine

## 2011-11-27 MED ORDER — PEG 3350-KCL-NA BICARB-NACL 420 G PO SOLR: 4000.0000 mL | ORAL | Status: DC

## 2011-11-27 NOTE — Progress Notes (Signed)
Pt is aware to hold her coumadin.

## 2011-11-27 NOTE — Progress Notes (Signed)
Appears per Vashti Hey, RN, at most recent visit that she may hold Coumadin X 3 days prior to procedure. Please make sure pt is aware of this.  Hold Coumadin starting Sept 6 (procedure on 9th).

## 2011-11-29 ENCOUNTER — Encounter (HOSPITAL_COMMUNITY): Payer: Self-pay | Admitting: Pharmacy Technician

## 2011-12-02 DIAGNOSIS — G43909 Migraine, unspecified, not intractable, without status migrainosus: Secondary | ICD-10-CM | POA: Diagnosis not present

## 2011-12-02 DIAGNOSIS — R002 Palpitations: Secondary | ICD-10-CM | POA: Diagnosis not present

## 2011-12-12 DIAGNOSIS — IMO0001 Reserved for inherently not codable concepts without codable children: Secondary | ICD-10-CM | POA: Diagnosis not present

## 2011-12-12 DIAGNOSIS — Z79899 Other long term (current) drug therapy: Secondary | ICD-10-CM | POA: Diagnosis not present

## 2011-12-12 DIAGNOSIS — G43909 Migraine, unspecified, not intractable, without status migrainosus: Secondary | ICD-10-CM | POA: Diagnosis not present

## 2011-12-12 DIAGNOSIS — G479 Sleep disorder, unspecified: Secondary | ICD-10-CM | POA: Diagnosis not present

## 2011-12-16 ENCOUNTER — Encounter (HOSPITAL_COMMUNITY): Payer: Self-pay | Admitting: *Deleted

## 2011-12-16 ENCOUNTER — Ambulatory Visit (HOSPITAL_COMMUNITY)
Admission: RE | Admit: 2011-12-16 | Discharge: 2011-12-16 | Disposition: A | Discharge status: Home / Self Care | Payer: Medicare Other | Source: Ambulatory Visit | Attending: Internal Medicine | Admitting: Internal Medicine

## 2011-12-16 ENCOUNTER — Encounter (HOSPITAL_COMMUNITY)
Admission: RE | Disposition: A | Discharge status: Home / Self Care | Payer: Self-pay | Source: Ambulatory Visit | Attending: Internal Medicine

## 2011-12-16 DIAGNOSIS — Z860100 Personal history of colon polyps, unspecified: Secondary | ICD-10-CM | POA: Insufficient documentation

## 2011-12-16 DIAGNOSIS — D126 Benign neoplasm of colon, unspecified: Secondary | ICD-10-CM

## 2011-12-16 DIAGNOSIS — D131 Benign neoplasm of stomach: Secondary | ICD-10-CM

## 2011-12-16 DIAGNOSIS — R1310 Dysphagia, unspecified: Secondary | ICD-10-CM | POA: Insufficient documentation

## 2011-12-16 DIAGNOSIS — Z1211 Encounter for screening for malignant neoplasm of colon: Secondary | ICD-10-CM

## 2011-12-16 DIAGNOSIS — K620 Anal polyp: Secondary | ICD-10-CM

## 2011-12-16 DIAGNOSIS — Z01812 Encounter for preprocedural laboratory examination: Secondary | ICD-10-CM | POA: Diagnosis not present

## 2011-12-16 DIAGNOSIS — E119 Type 2 diabetes mellitus without complications: Secondary | ICD-10-CM | POA: Diagnosis not present

## 2011-12-16 DIAGNOSIS — I10 Essential (primary) hypertension: Secondary | ICD-10-CM | POA: Insufficient documentation

## 2011-12-16 DIAGNOSIS — K449 Diaphragmatic hernia without obstruction or gangrene: Secondary | ICD-10-CM | POA: Insufficient documentation

## 2011-12-16 DIAGNOSIS — K621 Rectal polyp: Secondary | ICD-10-CM

## 2011-12-16 DIAGNOSIS — Z9049 Acquired absence of other specified parts of digestive tract: Secondary | ICD-10-CM | POA: Insufficient documentation

## 2011-12-16 HISTORY — PX: ESOPHAGOGASTRODUODENOSCOPY: SHX1529

## 2011-12-16 HISTORY — PX: COLONOSCOPY: SHX5424

## 2011-12-16 LAB — GLUCOSE, CAPILLARY: Glucose-Capillary: 186 mg/dL — ABNORMAL HIGH (ref 70–99)

## 2011-12-16 SURGERY — ESOPHAGOGASTRODUODENOSCOPY (EGD) WITH ESOPHAGEAL DILATION
Anesthesia: Moderate Sedation

## 2011-12-16 SURGERY — COLONOSCOPY
Anesthesia: Moderate Sedation

## 2011-12-16 MED ORDER — SODIUM CHLORIDE 0.9 % IJ SOLN
INTRAMUSCULAR | Status: AC
  Filled 2011-12-16: qty 10

## 2011-12-16 MED ORDER — PROMETHAZINE HCL 25 MG/ML IJ SOLN
INTRAMUSCULAR | Status: AC
  Filled 2011-12-16: qty 1

## 2011-12-16 MED ORDER — MIDAZOLAM HCL 5 MG/5ML IJ SOLN
INTRAMUSCULAR | Status: DC | PRN
  Administered 2011-12-16: 2 mg via INTRAVENOUS
  Administered 2011-12-16: 1 mg via INTRAVENOUS
  Administered 2011-12-16: 2 mg via INTRAVENOUS
  Administered 2011-12-16: 1 mg via INTRAVENOUS

## 2011-12-16 MED ORDER — FENTANYL CITRATE 0.05 MG/ML IJ SOLN
INTRAMUSCULAR | Status: AC
  Filled 2011-12-16: qty 4

## 2011-12-16 MED ORDER — PROMETHAZINE HCL 25 MG/ML IJ SOLN
12.5000 mg | Freq: Once | INTRAMUSCULAR | Status: AC
  Administered 2011-12-16: 12.5 mg via INTRAVENOUS

## 2011-12-16 MED ORDER — BUTAMBEN-TETRACAINE-BENZOCAINE 2-2-14 % EX AERO
INHALATION_SPRAY | CUTANEOUS | Status: DC | PRN
  Administered 2011-12-16: 2 via TOPICAL

## 2011-12-16 MED ORDER — SODIUM CHLORIDE 0.45 % IV SOLN
INTRAVENOUS | Status: DC
  Administered 2011-12-16: 09:00:00 via INTRAVENOUS

## 2011-12-16 MED ORDER — STERILE WATER FOR IRRIGATION IR SOLN
Status: DC | PRN
  Administered 2011-12-16: 11:00:00

## 2011-12-16 MED ORDER — FENTANYL CITRATE 0.05 MG/ML IJ SOLN
INTRAMUSCULAR | Status: DC | PRN
  Administered 2011-12-16: 25 ug via INTRAVENOUS
  Administered 2011-12-16: 50 ug via INTRAVENOUS

## 2011-12-16 MED ORDER — MIDAZOLAM HCL 5 MG/5ML IJ SOLN
INTRAMUSCULAR | Status: AC
  Filled 2011-12-16: qty 10

## 2011-12-16 NOTE — H&P (View-Only) (Signed)
Referring Provider: Ara Kussmaul, MD Primary Care Physician:  Vivia Ewing, MD Primary Gastroenterologist: Dr. Jena Gauss   Chief Complaint  Patient presents with  . Nausea    HPI:   77 year old female with hx of IBS, chronic diarrhea, GERD, abdominal pain, hx of adenomatous polyps. Due for surveillance colonoscopy this year. Multiple concerns today.  States wakes up in am, all she can eat is crackers. Nausea all the time. Has worsened in frequency, intensity. Hasn't been this sick since she was pregnant. No vomiting. No pain with eating. Has lost several lbs in last 2 weeks. +chronic reflux. On Nexium, helps with indigestion. Early satiety. Once in awhile esophageal dysphagia. +pill dysphagia.  Upper abdominal "burning", "gnawing" for several months. No NSAIDs. Will have diarrhea for several days then constipated several days. Chronic. Eating dried banana chips to help with constipation. No blood in stool. Cramping with diarrhea. Tries to follow low fat diet, stay away from dairy.   On Warfarin for afib.   Past Medical History  Diagnosis Date  . Loss of weight   . Diarrhea   . Esophageal reflux   . Unspecified sleep apnea   . Fibromyalgia   . Nephrolithiasis     seeing Dr. Jerre Simon  . Anxiety   . Personal history of other diseases of digestive system   . Palpitations   . Other and unspecified hyperlipidemia   . HTN (hypertension)   . Chest pain, unspecified   . Dyspnea   . Colonoscopy causing post-procedural bleeding     incomplete. by Dr. Jena Gauss 06/11/99 due to fixed non-compliant colon precluded exam to 40 cm, she had subsequent colectomy for diverticulitis since that time.   . Chronic back pain   . Hiatal hernia     recent EGD/TCS showed small hiatal hernia, normal apperaring tubular esophagus. s/p passage of a 54-french Maloney dilator, s/p biopsy esophageal mucosa, multiple fundal gland type gastric polyps. one large pedunculated polp with oozing, noted s/p clipping and snare  polypectomy. Biopsies of the gastric mucosa taken given her symptoms in her elevated count. normal D1-D3, s/p biospy D2-D3.   [name] Hiatal hernia     all bx unremarkable. on TCS she had left-sided diverticula, evidence of prior segmental resection with anastomosis, multiple colonic polyps s/p multiple snare polypectomies, s/p segmental biopsy and stool sampling. she had multiple tubular adenomas. no microscopic/collagenous colitis. stool culture, c diff, O+P, lactoferrin were negative.   . Ureteral stent retained     Not retained. ureteral stent removal gross hematuria resolved 10/11. coumadin restarted.   . Diabetes mellitus   . A-fib     on chronic warfarin    Past Surgical History  Procedure Date  . Total abdominal hysterectomy   . Tonsillectomy   . Colectomy     2002. Dr. Salvadore Dom for diverticulitis  . Esophagogastroduodenoscopy 02/22/09    normal s/p dilator;small HH/one large pedunculates polyp  s/p clipping, hyperplastic  . Colonoscopy 02/22/09    normal/left-sided diverticula/multiple colonic polyp, adenomatous  . Breast lumpectomy     benign cyst    Current Outpatient Prescriptions  Medication Sig Dispense Refill  . acetaminophen (TYLENOL) 325 MG tablet Take 650 mg by mouth every 6 (six) hours as needed. Dosage not specified       . albuterol (PROAIR HFA) 108 (90 BASE) MCG/ACT inhaler Inhale 2 puffs into the lungs every 4 (four) hours as needed.        . ALPRAZolam (XANAX) 1 MG tablet Take 1 mg by mouth 4 (  four) times daily as needed.       [name] amLODipine (NORVASC) 10 MG tablet Take 10 mg by mouth daily.       [name] BYETTA 10 MCG PEN 10 MCG/0.04ML SOLN Inject 10 mcg into the skin Twice daily.      . cloNIDine (CATAPRES - DOSED IN MG/24 HR) 0.1 mg/24hr patch Place 1 patch (0.1 mg total) onto the skin every 7 (seven) days.  4 patch  6  . esomeprazole (NEXIUM) 40 MG capsule Take 40 mg by mouth 2 (two) times daily.        . hydrochlorothiazide 50 MG tablet Take 50 mg by mouth daily.        . insulin glargine (LANTUS) 100 UNIT/ML injection Inject 50 Units into the skin at bedtime. UAD      . insulin lispro (HUMALOG) 100 UNIT/ML injection Inject into the skin. UAD      . metoprolol (LOPRESSOR) 50 MG tablet Take 50 mg by mouth 2 (two) times daily.       . nitroGLYCERIN (NITROSTAT) 0.4 MG SL tablet Place 0.4 mg under the tongue every 5 (five) minutes as needed.        . ondansetron (ZOFRAN-ODT) 4 MG disintegrating tablet Take 4 mg by mouth every 8 (eight) hours as needed.       [name] oxyCODONE (ROXICODONE) 15 MG immediate release tablet Take 15 mg by mouth every 6 (six) hours as needed.       . simvastatin (ZOCOR) 10 MG tablet Take 10 mg by mouth at bedtime.      . tizanidine (ZANAFLEX) 2 MG capsule Take 2 mg by mouth 3 (three) times daily as needed.       . warfarin (COUMADIN) 5 MG tablet       . zolpidem (AMBIEN) 10 MG tablet Take 10 mg by mouth at bedtime as needed.        . bisacodyl (DULCOLAX) 5 MG EC tablet Take 5 mg by mouth as needed.        . diltiazem (CARDIZEM CD) 120 MG 24 hr capsule Take 1 capsule (120 mg total) by mouth daily.  30 capsule  6  . rosuvastatin (CRESTOR) 10 MG tablet Take 10 mg by mouth daily.        . traZODone (DESYREL) 50 MG tablet May take 1 tab (50mg ) every evening as needed for sleep, may increase to 100mg  if needed.  60 tablet  2    Allergies as of 11/18/2011 - Review Complete 11/18/2011  Allergen Reaction Noted  . Iohexol  03/08/2004  . Meperidine hcl    . Penicillins      Family History  Problem Relation Age of Onset  . Coronary artery disease      family Hx   . Colon cancer Maternal Grandfather     History   Social History  . Marital Status: Widowed    Spouse Name: N/A    Number of Children: N/A  . Years of Education: N/A   Social History Main Topics  . Smoking status: Never Smoker   . Smokeless tobacco: Never Used   Comment: tobacco use - no  . Alcohol Use: No  . Drug Use: No  . Sexually Active: None   Other Topics Concern    . None   Social History Narrative   Retired-[employer], does not get regular exercise. Widowed, 4 living children (lost 1 son at age 29-MVA), lives with 2 sons    Review of Systems:  Gen: SEE HPI CV: Denies chest pain, palpitations, syncope, peripheral edema, and claudication. Resp: Denies dyspnea at rest, cough, wheezing, coughing up blood, and pleurisy. GI: SEE HPI  Derm: Denies rash, itching, dry skin Psych: Denies depression, anxiety, memory loss, confusion. No homicidal or suicidal ideation.  Heme: Denies bruising, bleeding, and enlarged lymph nodes.  Physical Exam: BP 140/80  Pulse 51  Temp 97.9 F (36.6 C) (Temporal)  Ht 5\' 3"  (1.6 m)  Wt 219 lb 12.8 oz (99.701 kg)  BMI 38.94 kg/m2 General:   Alert and oriented. No distress noted. Pleasant and cooperative.  Head:  Normocephalic and atraumatic. Eyes:  Conjuctiva clear without scleral icterus. Mouth:  Oral mucosa pink and moist. Good dentition. No lesions. Neck:  Supple, without mass or thyromegaly. Heart:  S1, S2 present without murmurs, rubs, or gallops. Regular rate and rhythm. Abdomen:  +BS, soft, TTP epigastric region and non-distended. No rebound or guarding. Large ventral hernia.  Msk:  Symmetrical without gross deformities. Normal posture. Extremities:  Trace edema bilateral lower extremities Neurologic:  Alert and  oriented x4;  grossly normal neurologically. Skin:  Intact without significant lesions or rashes. Cervical Nodes:  No significant cervical adenopathy. Psych:  Alert and cooperative. Normal mood and affect.

## 2011-12-16 NOTE — Op Note (Signed)
[facility] [address] [place], [address]   COLONOSCOPY PROCEDURE REPORT  PATIENT: [name], [name]  MR#:         [id number] BIRTHDATE: 02-04-1935 , 77  yrs. old GENDER: Female ENDOSCOPIST: R.  Roetta Sessions, MD FACP FACG REFERRED BY:  Vivia Ewing, M.D. PROCEDURE DATE:  12/16/2011 PROCEDURE:     colonoscopy with snare polypectomy and polyp ablation  INDICATIONS: history of colonic adenoma and hemicolectomy for diverticulitis  INFORMED CONSENT:  The risks, benefits, alternatives and imponderables including but not limited to bleeding, perforation as well as the possibility of a missed lesion have been reviewed.  The potential for biopsy, lesion removal, etc. have also been discussed.  Questions have been answered.  All parties agreeable. Please see the history and physical in the medical record for more information.  MEDICATIONS: Versed 6 mg IV and fentanyl 75 mcg IV in divided doses. 12.5 mg  DESCRIPTION OF PROCEDURE:  After a digital rectal exam was performed, the EC-[id number] ([id number])  colonoscope was advanced from the anus through the rectum and colon to the area of the cecum, ileocecal valve and appendiceal orifice.  The cecum was deeply intubated.  These structures were well-seen and photographed for the record.  From the level of the cecum and ileocecal valve, the scope was slowly and cautiously withdrawn.  The mucosal surfaces were carefully surveyed utilizing scope tip deflection to facilitate fold flattening as needed.  The scope was pulled down into the rectum where a thorough examination including retroflexion was performed.    FINDINGS:  suboptimal preparation. Multiple diminutive polyps in at 10 cm from the anal verge.Anastomosis at 25 cm. Multiple 3-5 mm polyps at the ileocecal valve, splenic flexure and descending segments.Otherwise, aside from the residual diverticula, the colonic mucosa appeared normal.  THERAPEUTIC / DIAGNOSTIC  MANEUVERS PERFORMED:  The above-mentioned colonic polyps were either hot or cold snared removed and submitted for the pathologist. The diminutive rectal polyps were ablated with the tip of a hot snare loop.  COMPLICATIONS: none  CECAL WITHDRAWAL TIME:  15 minutes  IMPRESSION:  colonic polyps-treated as described above. Status post prior sigmoid resection.  RECOMMENDATIONS: Followup on pathology. See EGD report.   _______________________________ eSigned:  R. Roetta Sessions, MD FACP [facility] 12/16/2011 11:24 AM   CC:    PATIENT NAME:  [facility], Riches MR#: [id number]

## 2011-12-16 NOTE — Op Note (Signed)
[facility] [address] [place], [address]   ENDOSCOPY PROCEDURE REPORT  PATIENT: [name], [name]  MR#: [id number] BIRTHDATE: April 11, 1934 , 77  yrs. old GENDER: Female ENDOSCOPIST: R.  Roetta Sessions, MD FACP FACG REFERRED BY:  Vivia Ewing, M.D. PROCEDURE DATE:  12/16/2011 PROCEDURE:     EGD with Elease Hashimoto dilation  INDICATIONS:   Esophageal dysphagia  INFORMED CONSENT:   The risks, benefits, limitations, alternatives and imponderables have been discussed.  The potential for biopsy, esophogeal dilation, etc. have also been reviewed.  Questions have been answered.  All parties agreeable.  Please see the history and physical in the medical record for more information.  MEDICATIONS:     fentanyl 75 mcg IV in Versed 5 mg IV. Phenergan 12.5 mg IV. Cetacaine spray.  DESCRIPTION OF PROCEDURE:   The EG-[id number] ([id number])  endoscope was introduced through the mouth and advanced to the second portion of the duodenum without difficulty or limitations.  The mucosal surfaces were surveyed very carefully during advancement of the scope and upon withdrawal.  Retroflexion view of the proximal stomach and esophagogastric junction was performed.      FINDINGS: normal, patent appearing tubular esophagus. Stomach empty. Small hiatal hernia. Multiple 3-4 mm gastric polyps (previously biopsied proven to be benign-hyperplastic). The remainder of the gastric mucosa appeared normal. Patent pylorus. The first and second portion of the duodenum appeared normal.  THERAPEUTIC / DIAGNOSTIC MANEUVERS PERFORMED:  A 56 French Maloney dilator was passed to full insertion easily. A look back revealed no apparent complication with this maneuver.   COMPLICATIONS:  None  IMPRESSION:  Small hiatal hernia. Hyperplastic appearing polyps. Status post Elease Hashimoto dilation as described above.  RECOMMENDATIONS:  Stop Nexium for now. Three-week trial of Dexilant 60 mg orally daily to see if we can  get better control of her reflux symptoms. See colonoscopy report.    _______________________________ R. Roetta Sessions, MD FACP [facility] eSigned:  R. Roetta Sessions, MD FACP [facility] 12/16/2011 10:51 AM     CC:

## 2011-12-16 NOTE — Interval H&P Note (Signed)
History and Physical Interval Note:  12/16/2011 10:26 AM  [name]  has presented today for surgery, with the diagnosis of Hx of Adenomatous Polyps and Dysphagia  The various methods of treatment have been discussed with the patient and family. After consideration of risks, benefits and other options for treatment, the patient has consented to  Procedure(s) (LRB) with comments: COLONOSCOPY (N/A) - 10:00/Give Phenergan 12.5 mg IV 30 min prior to procedure ESOPHAGOGASTRODUODENOSCOPY (EGD) WITH ESOPHAGEAL DILATION (N/A) - Give Phenergan 12.5 mg IV 30 min prior to procedure as a surgical intervention .  The patient's history has been reviewed, patient examined, no change in status, stable for surgery.  I have reviewed the patient's chart and labs.  Questions were answered to the patient's satisfaction.     Eula Listen

## 2011-12-18 ENCOUNTER — Encounter (HOSPITAL_COMMUNITY): Payer: Self-pay | Admitting: Internal Medicine

## 2011-12-19 ENCOUNTER — Encounter: Payer: Self-pay | Admitting: Internal Medicine

## 2011-12-19 ENCOUNTER — Encounter: Payer: Self-pay | Admitting: *Deleted

## 2011-12-19 DIAGNOSIS — I10 Essential (primary) hypertension: Secondary | ICD-10-CM | POA: Diagnosis not present

## 2011-12-19 DIAGNOSIS — E669 Obesity, unspecified: Secondary | ICD-10-CM | POA: Diagnosis not present

## 2011-12-19 DIAGNOSIS — E785 Hyperlipidemia, unspecified: Secondary | ICD-10-CM | POA: Diagnosis not present

## 2011-12-19 DIAGNOSIS — E1165 Type 2 diabetes mellitus with hyperglycemia: Secondary | ICD-10-CM | POA: Diagnosis not present

## 2011-12-19 DIAGNOSIS — E1129 Type 2 diabetes mellitus with other diabetic kidney complication: Secondary | ICD-10-CM | POA: Diagnosis not present

## 2011-12-24 DIAGNOSIS — IMO0001 Reserved for inherently not codable concepts without codable children: Secondary | ICD-10-CM | POA: Diagnosis not present

## 2011-12-24 DIAGNOSIS — E119 Type 2 diabetes mellitus without complications: Secondary | ICD-10-CM | POA: Diagnosis not present

## 2011-12-24 DIAGNOSIS — I4891 Unspecified atrial fibrillation: Secondary | ICD-10-CM | POA: Diagnosis not present

## 2011-12-24 DIAGNOSIS — I10 Essential (primary) hypertension: Secondary | ICD-10-CM | POA: Diagnosis not present

## 2011-12-27 ENCOUNTER — Ambulatory Visit (INDEPENDENT_AMBULATORY_CARE_PROVIDER_SITE_OTHER): Payer: Medicare Other | Admitting: *Deleted

## 2011-12-27 DIAGNOSIS — Z7901 Long term (current) use of anticoagulants: Secondary | ICD-10-CM

## 2011-12-27 DIAGNOSIS — I4891 Unspecified atrial fibrillation: Secondary | ICD-10-CM | POA: Diagnosis not present

## 2012-01-07 DIAGNOSIS — M62838 Other muscle spasm: Secondary | ICD-10-CM | POA: Diagnosis not present

## 2012-01-07 DIAGNOSIS — G479 Sleep disorder, unspecified: Secondary | ICD-10-CM | POA: Diagnosis not present

## 2012-01-07 DIAGNOSIS — IMO0001 Reserved for inherently not codable concepts without codable children: Secondary | ICD-10-CM | POA: Diagnosis not present

## 2012-01-07 DIAGNOSIS — Z79899 Other long term (current) drug therapy: Secondary | ICD-10-CM | POA: Diagnosis not present

## 2012-01-12 ENCOUNTER — Other Ambulatory Visit: Payer: Self-pay | Admitting: Cardiology

## 2012-01-17 ENCOUNTER — Ambulatory Visit (INDEPENDENT_AMBULATORY_CARE_PROVIDER_SITE_OTHER): Payer: Medicare Other | Admitting: *Deleted

## 2012-01-17 DIAGNOSIS — Z7901 Long term (current) use of anticoagulants: Secondary | ICD-10-CM | POA: Diagnosis not present

## 2012-01-17 DIAGNOSIS — I4891 Unspecified atrial fibrillation: Secondary | ICD-10-CM | POA: Diagnosis not present

## 2012-01-17 LAB — POCT INR: INR: 3

## 2012-01-22 ENCOUNTER — Encounter: Payer: Self-pay | Admitting: Internal Medicine

## 2012-02-03 NOTE — Progress Notes (Signed)
Patient ID: [name], female   DOB: 1934-09-07, 77 y.o.   MRN: [id number] Pt called to let us know she received a letter in the mail to call and get set up for her TCS but she had one last month because she was having problems.

## 2012-02-10 DIAGNOSIS — M62838 Other muscle spasm: Secondary | ICD-10-CM | POA: Diagnosis not present

## 2012-02-10 DIAGNOSIS — IMO0001 Reserved for inherently not codable concepts without codable children: Secondary | ICD-10-CM | POA: Diagnosis not present

## 2012-02-10 DIAGNOSIS — Z79899 Other long term (current) drug therapy: Secondary | ICD-10-CM | POA: Diagnosis not present

## 2012-02-10 DIAGNOSIS — G479 Sleep disorder, unspecified: Secondary | ICD-10-CM | POA: Diagnosis not present

## 2012-02-11 DIAGNOSIS — Z23 Encounter for immunization: Secondary | ICD-10-CM | POA: Diagnosis not present

## 2012-02-14 ENCOUNTER — Ambulatory Visit (INDEPENDENT_AMBULATORY_CARE_PROVIDER_SITE_OTHER): Payer: Medicare Other | Admitting: *Deleted

## 2012-02-14 DIAGNOSIS — Z7901 Long term (current) use of anticoagulants: Secondary | ICD-10-CM

## 2012-02-14 DIAGNOSIS — I4891 Unspecified atrial fibrillation: Secondary | ICD-10-CM

## 2012-03-10 DIAGNOSIS — M545 Low back pain, unspecified: Secondary | ICD-10-CM | POA: Diagnosis not present

## 2012-03-10 DIAGNOSIS — M62838 Other muscle spasm: Secondary | ICD-10-CM | POA: Diagnosis not present

## 2012-03-10 DIAGNOSIS — Z79899 Other long term (current) drug therapy: Secondary | ICD-10-CM | POA: Diagnosis not present

## 2012-03-10 DIAGNOSIS — IMO0001 Reserved for inherently not codable concepts without codable children: Secondary | ICD-10-CM | POA: Diagnosis not present

## 2012-03-19 DIAGNOSIS — E559 Vitamin D deficiency, unspecified: Secondary | ICD-10-CM | POA: Diagnosis not present

## 2012-03-19 DIAGNOSIS — E669 Obesity, unspecified: Secondary | ICD-10-CM | POA: Diagnosis not present

## 2012-03-19 DIAGNOSIS — E785 Hyperlipidemia, unspecified: Secondary | ICD-10-CM | POA: Diagnosis not present

## 2012-03-19 DIAGNOSIS — I10 Essential (primary) hypertension: Secondary | ICD-10-CM | POA: Diagnosis not present

## 2012-03-19 DIAGNOSIS — IMO0001 Reserved for inherently not codable concepts without codable children: Secondary | ICD-10-CM | POA: Diagnosis not present

## 2012-03-24 DIAGNOSIS — R110 Nausea: Secondary | ICD-10-CM | POA: Diagnosis not present

## 2012-03-24 DIAGNOSIS — R1011 Right upper quadrant pain: Secondary | ICD-10-CM | POA: Diagnosis not present

## 2012-03-25 DIAGNOSIS — K7689 Other specified diseases of liver: Secondary | ICD-10-CM | POA: Diagnosis not present

## 2012-03-25 DIAGNOSIS — Q6101 Congenital single renal cyst: Secondary | ICD-10-CM | POA: Diagnosis not present

## 2012-03-25 DIAGNOSIS — N1330 Unspecified hydronephrosis: Secondary | ICD-10-CM | POA: Diagnosis not present

## 2012-03-25 DIAGNOSIS — N281 Cyst of kidney, acquired: Secondary | ICD-10-CM | POA: Diagnosis not present

## 2012-03-25 DIAGNOSIS — R110 Nausea: Secondary | ICD-10-CM | POA: Diagnosis not present

## 2012-03-25 DIAGNOSIS — R1011 Right upper quadrant pain: Secondary | ICD-10-CM | POA: Diagnosis not present

## 2012-03-26 DIAGNOSIS — E559 Vitamin D deficiency, unspecified: Secondary | ICD-10-CM | POA: Diagnosis not present

## 2012-03-26 DIAGNOSIS — E1165 Type 2 diabetes mellitus with hyperglycemia: Secondary | ICD-10-CM | POA: Diagnosis not present

## 2012-03-26 DIAGNOSIS — E1129 Type 2 diabetes mellitus with other diabetic kidney complication: Secondary | ICD-10-CM | POA: Diagnosis not present

## 2012-03-26 DIAGNOSIS — E669 Obesity, unspecified: Secondary | ICD-10-CM | POA: Diagnosis not present

## 2012-03-27 ENCOUNTER — Ambulatory Visit (INDEPENDENT_AMBULATORY_CARE_PROVIDER_SITE_OTHER): Payer: Medicare Other | Admitting: *Deleted

## 2012-03-27 DIAGNOSIS — Z7901 Long term (current) use of anticoagulants: Secondary | ICD-10-CM | POA: Diagnosis not present

## 2012-03-27 DIAGNOSIS — I4891 Unspecified atrial fibrillation: Secondary | ICD-10-CM | POA: Diagnosis not present

## 2012-04-10 ENCOUNTER — Telehealth: Payer: Self-pay | Admitting: Physician Assistant

## 2012-04-10 DIAGNOSIS — M545 Low back pain, unspecified: Secondary | ICD-10-CM | POA: Diagnosis not present

## 2012-04-10 DIAGNOSIS — Z79899 Other long term (current) drug therapy: Secondary | ICD-10-CM | POA: Diagnosis not present

## 2012-04-10 DIAGNOSIS — IMO0001 Reserved for inherently not codable concepts without codable children: Secondary | ICD-10-CM | POA: Diagnosis not present

## 2012-04-10 DIAGNOSIS — M62838 Other muscle spasm: Secondary | ICD-10-CM | POA: Diagnosis not present

## 2012-04-10 NOTE — Telephone Encounter (Signed)
Left message for patient to call and schedule appointment from Recall  ° °

## 2012-04-24 NOTE — Telephone Encounter (Signed)
Left message for patient to call and schedule appointment from Recall  ° °

## 2012-05-08 ENCOUNTER — Ambulatory Visit (INDEPENDENT_AMBULATORY_CARE_PROVIDER_SITE_OTHER): Payer: Medicare Other | Admitting: *Deleted

## 2012-05-08 DIAGNOSIS — I4891 Unspecified atrial fibrillation: Secondary | ICD-10-CM | POA: Diagnosis not present

## 2012-05-08 DIAGNOSIS — Z7901 Long term (current) use of anticoagulants: Secondary | ICD-10-CM

## 2012-05-08 DIAGNOSIS — Z79899 Other long term (current) drug therapy: Secondary | ICD-10-CM | POA: Diagnosis not present

## 2012-05-08 DIAGNOSIS — M25519 Pain in unspecified shoulder: Secondary | ICD-10-CM | POA: Diagnosis not present

## 2012-05-08 DIAGNOSIS — M542 Cervicalgia: Secondary | ICD-10-CM | POA: Diagnosis not present

## 2012-05-08 DIAGNOSIS — IMO0001 Reserved for inherently not codable concepts without codable children: Secondary | ICD-10-CM | POA: Diagnosis not present

## 2012-05-29 ENCOUNTER — Ambulatory Visit (INDEPENDENT_AMBULATORY_CARE_PROVIDER_SITE_OTHER): Payer: Medicare Other | Admitting: *Deleted

## 2012-05-29 DIAGNOSIS — Z7901 Long term (current) use of anticoagulants: Secondary | ICD-10-CM | POA: Diagnosis not present

## 2012-05-29 DIAGNOSIS — I4891 Unspecified atrial fibrillation: Secondary | ICD-10-CM | POA: Diagnosis not present

## 2012-05-29 LAB — POCT INR: INR: 2.7

## 2012-06-07 ENCOUNTER — Encounter: Payer: Self-pay | Admitting: Cardiology

## 2012-06-08 ENCOUNTER — Ambulatory Visit: Payer: Medicare Other | Admitting: Cardiology

## 2012-06-09 DIAGNOSIS — IMO0001 Reserved for inherently not codable concepts without codable children: Secondary | ICD-10-CM | POA: Diagnosis not present

## 2012-06-09 DIAGNOSIS — M25519 Pain in unspecified shoulder: Secondary | ICD-10-CM | POA: Diagnosis not present

## 2012-06-09 DIAGNOSIS — G894 Chronic pain syndrome: Secondary | ICD-10-CM | POA: Diagnosis not present

## 2012-06-09 DIAGNOSIS — R51 Headache: Secondary | ICD-10-CM | POA: Diagnosis not present

## 2012-06-09 DIAGNOSIS — M542 Cervicalgia: Secondary | ICD-10-CM | POA: Diagnosis not present

## 2012-06-09 DIAGNOSIS — Z79899 Other long term (current) drug therapy: Secondary | ICD-10-CM | POA: Diagnosis not present

## 2012-06-11 DIAGNOSIS — E11339 Type 2 diabetes mellitus with moderate nonproliferative diabetic retinopathy without macular edema: Secondary | ICD-10-CM | POA: Diagnosis not present

## 2012-06-11 DIAGNOSIS — H353190 Nonexudative age-related macular degeneration, unspecified eye, stage unspecified: Secondary | ICD-10-CM | POA: Diagnosis not present

## 2012-06-11 DIAGNOSIS — E119 Type 2 diabetes mellitus without complications: Secondary | ICD-10-CM | POA: Diagnosis not present

## 2012-06-17 DIAGNOSIS — IMO0001 Reserved for inherently not codable concepts without codable children: Secondary | ICD-10-CM | POA: Diagnosis not present

## 2012-06-17 DIAGNOSIS — E559 Vitamin D deficiency, unspecified: Secondary | ICD-10-CM | POA: Diagnosis not present

## 2012-06-24 DIAGNOSIS — E785 Hyperlipidemia, unspecified: Secondary | ICD-10-CM | POA: Diagnosis not present

## 2012-06-24 DIAGNOSIS — IMO0001 Reserved for inherently not codable concepts without codable children: Secondary | ICD-10-CM | POA: Diagnosis not present

## 2012-06-24 DIAGNOSIS — E559 Vitamin D deficiency, unspecified: Secondary | ICD-10-CM | POA: Diagnosis not present

## 2012-06-24 DIAGNOSIS — E669 Obesity, unspecified: Secondary | ICD-10-CM | POA: Diagnosis not present

## 2012-06-26 ENCOUNTER — Ambulatory Visit (INDEPENDENT_AMBULATORY_CARE_PROVIDER_SITE_OTHER): Payer: Medicare Other | Admitting: *Deleted

## 2012-06-26 DIAGNOSIS — Z7901 Long term (current) use of anticoagulants: Secondary | ICD-10-CM

## 2012-06-26 DIAGNOSIS — I4891 Unspecified atrial fibrillation: Secondary | ICD-10-CM | POA: Diagnosis not present

## 2012-06-26 LAB — POCT INR: INR: 2.1

## 2012-07-08 ENCOUNTER — Other Ambulatory Visit: Payer: Self-pay | Admitting: Cardiology

## 2012-07-08 DIAGNOSIS — G894 Chronic pain syndrome: Secondary | ICD-10-CM | POA: Diagnosis not present

## 2012-07-08 DIAGNOSIS — IMO0001 Reserved for inherently not codable concepts without codable children: Secondary | ICD-10-CM | POA: Diagnosis not present

## 2012-07-08 DIAGNOSIS — R51 Headache: Secondary | ICD-10-CM | POA: Diagnosis not present

## 2012-07-08 DIAGNOSIS — Z79899 Other long term (current) drug therapy: Secondary | ICD-10-CM | POA: Diagnosis not present

## 2012-07-23 DIAGNOSIS — E11319 Type 2 diabetes mellitus with unspecified diabetic retinopathy without macular edema: Secondary | ICD-10-CM | POA: Diagnosis not present

## 2012-07-23 DIAGNOSIS — H2589 Other age-related cataract: Secondary | ICD-10-CM | POA: Diagnosis not present

## 2012-07-23 DIAGNOSIS — E1139 Type 2 diabetes mellitus with other diabetic ophthalmic complication: Secondary | ICD-10-CM | POA: Diagnosis not present

## 2012-08-03 ENCOUNTER — Encounter (HOSPITAL_COMMUNITY): Payer: Self-pay | Admitting: Pharmacy Technician

## 2012-08-07 DIAGNOSIS — IMO0001 Reserved for inherently not codable concepts without codable children: Secondary | ICD-10-CM | POA: Diagnosis not present

## 2012-08-07 DIAGNOSIS — G894 Chronic pain syndrome: Secondary | ICD-10-CM | POA: Diagnosis not present

## 2012-08-07 DIAGNOSIS — Z79899 Other long term (current) drug therapy: Secondary | ICD-10-CM | POA: Diagnosis not present

## 2012-08-07 DIAGNOSIS — M62838 Other muscle spasm: Secondary | ICD-10-CM | POA: Diagnosis not present

## 2012-08-10 ENCOUNTER — Encounter: Payer: Self-pay | Admitting: Cardiology

## 2012-08-10 NOTE — Patient Instructions (Addendum)
Your procedure is scheduled on:  08/17/2012  Report to [facility] at  1200   AM.  Call this number if you have problems the morning of surgery: [phone]   Remember:   Do not eat or drink :After Midnight.    Take these medicines the morning of surgery with A SIP OF WATER: Xanax, Nexium and use albuterol inhaler   Do not wear jewelry, make-up or nail polish.  Do not wear lotions, powders, or perfumes. You may wear deodorant.  Do not shave 48 hours prior to surgery.  Do not bring valuables to the hospital.  Contacts, dentures or bridgework may not be worn into surgery.  Patients discharged the day of surgery will not be allowed to drive home.  Name and phone number of your driver:    Please read over the following fact sheets that you were given: Pain Booklet, Surgical Site Infection Prevention, Anesthesia Post-op Instructions and Care and Recovery After Surgery  Cataract Surgery  A cataract is a clouding of the lens of the eye. When a lens becomes cloudy, vision is reduced based on the degree and nature of the clouding. Surgery may be needed to improve vision. Surgery removes the cloudy lens and usually replaces it with a substitute lens (intraocular lens, IOL). LET YOUR EYE DOCTOR KNOW ABOUT:  Allergies to food or medicine.   Medicines taken including herbs, eyedrops, over-the-counter medicines, and creams.   Use of steroids (by mouth or creams).   Previous problems with anesthetics or numbing medicine.   History of bleeding problems or blood clots.   Previous surgery.   Other health problems, including diabetes and kidney problems.   Possibility of pregnancy, if this applies.  RISKS AND COMPLICATIONS  Infection.   Inflammation of the eyeball (endophthalmitis) that can spread to both eyes (sympathetic ophthalmia).   Poor wound healing.   If an IOL is inserted, it can later fall out of proper position. This is very uncommon.   Clouding of the part of your eye that  holds an IOL in place. This is called an "after-cataract." These are uncommon, but easily treated.  BEFORE THE PROCEDURE  Do not eat or drink anything except small amounts of water for 8 to 12 before your surgery, or as directed by your caregiver.   Unless you are told otherwise, continue any eyedrops you have been prescribed.   Talk to your primary caregiver about all other medicines that you take (both prescription and non-prescription). In some cases, you may need to stop or change medicines near the time of your surgery. This is most important if you are taking blood-thinning medicine.Do not stop medicines unless you are told to do so.   Arrange for someone to drive you to and from the procedure.   Do not put contact lenses in either eye on the day of your surgery.  PROCEDURE There is more than one method for safely removing a cataract. Your doctor can explain the differences and help determine which is best for you. Phacoemulsification surgery is the most common form of cataract surgery.  An injection is given behind the eye or eyedrops are given to make this a painless procedure.   A small cut (incision) is made on the edge of the clear, dome-shaped surface that covers the front of the eye (cornea).   A tiny probe is painlessly inserted into the eye. This device gives off ultrasound waves that soften and break up the cloudy center of the lens. This  makes it easier for the cloudy lens to be removed by suction.   An IOL may be implanted.   The normal lens of the eye is covered by a clear capsule. Part of that capsule is intentionally left in the eye to support the IOL.   Your surgeon may or may not use stitches to close the incision.  There are other forms of cataract surgery that require a larger incision and stiches to close the eye. This approach is taken in cases where the doctor feels that the cataract cannot be easily removed using phacoemulsification. AFTER THE  PROCEDURE  When an IOL is implanted, it does not need care. It becomes a permanent part of your eye and cannot be seen or felt.   Your doctor will schedule follow-up exams to check on your progress.   Review your other medicines with your doctor to see which can be resumed after surgery.   Use eyedrops or take medicine as prescribed by your doctor.  Document Released: 03/14/2011 Document Reviewed: 03/11/2011 [facility] Patient Information 2012 [place].  .Cataract Surgery Care After Refer to this sheet in the next few weeks. These instructions provide you with information on caring for yourself after your procedure. Your caregiver may also give you more specific instructions. Your treatment has been planned according to current medical practices, but problems sometimes occur. Call your caregiver if you have any problems or questions after your procedure.  HOME CARE INSTRUCTIONS   Avoid strenuous activities as directed by your caregiver.   Ask your caregiver when you can resume driving.   Use eyedrops or other medicines to help healing and control pressure inside your eye as directed by your caregiver.   Only take over-the-counter or prescription medicines for pain, discomfort, or fever as directed by your caregiver.   Do not to touch or rub your eyes.   You may be instructed to use a protective shield during the first few days and nights after surgery. If not, wear sunglasses to protect your eyes. This is to protect the eye from pressure or from being accidentally bumped.   Keep the area around your eye clean and dry. Avoid swimming or allowing water to hit you directly in the face while showering. Keep soap and shampoo out of your eyes.   Do not bend or lift heavy objects. Bending increases pressure in the eye. You can walk, climb stairs, and do light household chores.   Do not put a contact lens into the eye that had surgery until your caregiver says it is okay to do so.    Ask your doctor when you can return to work. This will depend on the kind of work that you do. If you work in a dusty environment, you may be advised to wear protective eyewear for a period of time.   Ask your caregiver when it will be safe to engage in sexual activity.   Continue with your regular eye exams as directed by your caregiver.  What to expect:  It is normal to feel itching and mild discomfort for a few days after cataract surgery. Some fluid discharge is also common, and your eye may be sensitive to light and touch.   After 1 to 2 days, even moderate discomfort should disappear. In most cases, healing will take about 6 weeks.   If you received an intraocular lens (IOL), you may notice that colors are very bright or have a blue tinge. Also, if you have been in bright  sunlight, everything may appear reddish for a few hours. If you see these color tinges, it is because your lens is clear and no longer cloudy. Within a few months after receiving an IOL, these extra colors should go away. When you have healed, you will probably need new glasses.  SEEK MEDICAL CARE IF:   You have increased bruising around your eye.   You have discomfort not helped by medicine.  SEEK IMMEDIATE MEDICAL CARE IF:   You have a fever.   You have a worsening or sudden vision loss.   You have redness, swelling, or increasing pain in the eye.   You have a thick discharge from the eye that had surgery.  MAKE SURE YOU:  Understand these instructions.   Will watch your condition.   Will get help right away if you are not doing well or get worse.  Document Released: 10/12/2004 Document Revised: 03/14/2011 Document Reviewed: 11/16/2010 [facility] Patient Information 2012 [place], [place]. PATIENT INSTRUCTIONS POST-ANESTHESIA  IMMEDIATELY FOLLOWING SURGERY:  Do not drive or operate machinery for the first twenty four hours after surgery.  Do not make any important decisions for twenty four hours after  surgery or while taking narcotic pain medications or sedatives.  If you develop intractable nausea and vomiting or a severe headache please notify your doctor immediately.  FOLLOW-UP:  Please make an appointment with your surgeon as instructed. You do not need to follow up with anesthesia unless specifically instructed to do so.  WOUND CARE INSTRUCTIONS (if applicable):  Keep a dry clean dressing on the anesthesia/puncture wound site if there is drainage.  Once the wound has quit draining you may leave it open to air.  Generally you should leave the bandage intact for twenty four hours unless there is drainage.  If the epidural site drains for more than 36-48 hours please call the anesthesia department.  QUESTIONS?:  Please feel free to call your physician or the hospital operator if you have any questions, and they will be happy to assist you.

## 2012-08-11 ENCOUNTER — Encounter (HOSPITAL_COMMUNITY)
Admission: RE | Admit: 2012-08-11 | Discharge: 2012-08-11 | Disposition: A | Discharge status: Home / Self Care | Payer: Medicare Other | Source: Ambulatory Visit | Attending: Ophthalmology | Admitting: Ophthalmology

## 2012-08-11 ENCOUNTER — Encounter: Payer: Self-pay | Admitting: Cardiology

## 2012-08-11 ENCOUNTER — Ambulatory Visit (INDEPENDENT_AMBULATORY_CARE_PROVIDER_SITE_OTHER): Payer: Medicare Other | Admitting: *Deleted

## 2012-08-11 ENCOUNTER — Encounter (HOSPITAL_COMMUNITY): Payer: Self-pay

## 2012-08-11 ENCOUNTER — Ambulatory Visit (INDEPENDENT_AMBULATORY_CARE_PROVIDER_SITE_OTHER): Payer: Medicare Other | Admitting: Cardiology

## 2012-08-11 ENCOUNTER — Other Ambulatory Visit: Payer: Self-pay

## 2012-08-11 VITALS — BP 143/76 | HR 76 | Ht 63.0 in | Wt 217.0 lb

## 2012-08-11 DIAGNOSIS — IMO0001 Reserved for inherently not codable concepts without codable children: Secondary | ICD-10-CM | POA: Diagnosis not present

## 2012-08-11 DIAGNOSIS — E785 Hyperlipidemia, unspecified: Secondary | ICD-10-CM | POA: Diagnosis not present

## 2012-08-11 DIAGNOSIS — I4821 Permanent atrial fibrillation: Secondary | ICD-10-CM

## 2012-08-11 DIAGNOSIS — Z91013 Allergy to seafood: Secondary | ICD-10-CM | POA: Diagnosis not present

## 2012-08-11 DIAGNOSIS — H2589 Other age-related cataract: Secondary | ICD-10-CM | POA: Diagnosis not present

## 2012-08-11 DIAGNOSIS — I4891 Unspecified atrial fibrillation: Secondary | ICD-10-CM

## 2012-08-11 DIAGNOSIS — Z880 Allergy status to penicillin: Secondary | ICD-10-CM | POA: Diagnosis not present

## 2012-08-11 DIAGNOSIS — Z79899 Other long term (current) drug therapy: Secondary | ICD-10-CM | POA: Diagnosis not present

## 2012-08-11 DIAGNOSIS — G4730 Sleep apnea, unspecified: Secondary | ICD-10-CM | POA: Diagnosis not present

## 2012-08-11 DIAGNOSIS — G709 Myoneural disorder, unspecified: Secondary | ICD-10-CM | POA: Diagnosis not present

## 2012-08-11 DIAGNOSIS — Z888 Allergy status to other drugs, medicaments and biological substances status: Secondary | ICD-10-CM | POA: Diagnosis not present

## 2012-08-11 DIAGNOSIS — K219 Gastro-esophageal reflux disease without esophagitis: Secondary | ICD-10-CM | POA: Diagnosis not present

## 2012-08-11 DIAGNOSIS — Z7901 Long term (current) use of anticoagulants: Secondary | ICD-10-CM | POA: Diagnosis not present

## 2012-08-11 DIAGNOSIS — J45909 Unspecified asthma, uncomplicated: Secondary | ICD-10-CM | POA: Diagnosis not present

## 2012-08-11 DIAGNOSIS — I10 Essential (primary) hypertension: Secondary | ICD-10-CM | POA: Diagnosis not present

## 2012-08-11 DIAGNOSIS — Z794 Long term (current) use of insulin: Secondary | ICD-10-CM | POA: Diagnosis not present

## 2012-08-11 DIAGNOSIS — E119 Type 2 diabetes mellitus without complications: Secondary | ICD-10-CM | POA: Diagnosis not present

## 2012-08-11 DIAGNOSIS — N289 Disorder of kidney and ureter, unspecified: Secondary | ICD-10-CM | POA: Diagnosis not present

## 2012-08-11 DIAGNOSIS — F411 Generalized anxiety disorder: Secondary | ICD-10-CM | POA: Diagnosis not present

## 2012-08-11 HISTORY — DX: Ventral hernia without obstruction or gangrene: K43.9

## 2012-08-11 HISTORY — DX: Unspecified asthma, uncomplicated: J45.909

## 2012-08-11 LAB — HEMOGLOBIN AND HEMATOCRIT, BLOOD
HCT: 42.2 % (ref 36.0–46.0)
Hemoglobin: 14.3 g/dL (ref 12.0–15.0)

## 2012-08-11 LAB — BASIC METABOLIC PANEL
BUN: 23 mg/dL (ref 6–23)
CO2: 30 mEq/L (ref 19–32)
Chloride: 100 mEq/L (ref 96–112)
GFR calc Af Amer: 40 mL/min — ABNORMAL LOW (ref >90)
Potassium: 4.5 mEq/L (ref 3.5–5.1)

## 2012-08-11 LAB — POCT INR: INR: 3

## 2012-08-11 NOTE — Patient Instructions (Addendum)

## 2012-08-11 NOTE — Progress Notes (Signed)
Clinical Summary [name] is a 78 y.o.female presenting for a followup visit, last seen in the office by Dr. Andee Lineman in June 2013. Can she reports occasional sense of palpitations and shortness of breath, no dizziness or syncope. I reviewed her prior workup including cardiac monitoring as well as her echocardiogram from 2011.  She reports compliance with her medications, no major bleeding problems on Coumadin. Lopressor dose has been stable for quite some time.  She denies any significant chest pain. She just had an ECG done at [facility] prior to planned cataract surgery. Tracing has not been scanned into EPIC yet..   Allergies  Allergen Reactions  . Adhesive (Tape) Itching    Pulls skin off.   . Iohexol Nausea And Vomiting  . Meperidine Hcl Nausea And Vomiting  . Shellfish Allergy Diarrhea and Nausea And Vomiting  . Penicillins Nausea And Vomiting and Rash    Current Outpatient Prescriptions  Medication Sig Dispense Refill  . acetaminophen (TYLENOL) 325 MG tablet Take 650 mg by mouth every 6 (six) hours as needed.       [name] albuterol (PROAIR HFA) 108 (90 BASE) MCG/ACT inhaler Inhale 2 puffs into the lungs every 4 (four) hours as needed. Shortness of Breath      . ALPRAZolam (XANAX) 1 MG tablet Take 1 mg by mouth 3 (three) times daily as needed. Anxiety/Stress      . amLODipine (NORVASC) 10 MG tablet Take 10 mg by mouth daily.      [name] BYETTA 10 MCG PEN 10 MCG/0.04ML SOLN Inject 10 mcg into the skin Twice daily.      . cloNIDine (CATAPRES - DOSED IN MG/24 HR) 0.1 mg/24hr patch Place 1 patch onto the skin once a week. Switches on Sunday      . esomeprazole (NEXIUM) 40 MG capsule Take 40 mg by mouth 2 (two) times daily.        . hydrochlorothiazide 50 MG tablet Take 50 mg by mouth daily.       . insulin glargine (LANTUS) 100 UNIT/ML injection Inject 50 Units into the skin at bedtime.       . insulin lispro (HUMALOG) 100 UNIT/ML injection Inject 10-11 Units into the skin 3 (three) times  daily.       [name] lidocaine (LIDODERM) 5 % Place 1 patch onto the skin daily as needed (Pain). Remove & Discard patch within 12 hours or as directed by MD      . metoprolol (LOPRESSOR) 50 MG tablet Take 50 mg by mouth 2 (two) times daily.       . nitroGLYCERIN (NITROSTAT) 0.4 MG SL tablet Place 0.4 mg under the tongue every 5 (five) minutes as needed. Chest Pains      . ondansetron (ZOFRAN-ODT) 4 MG disintegrating tablet Take 4 mg by mouth every 8 (eight) hours as needed. Nausea and Vomiting      . oxyCODONE (ROXICODONE) 15 MG immediate release tablet Take 15 mg by mouth every 6 (six) hours as needed. Pain      . rosuvastatin (CRESTOR) 10 MG tablet Take 10 mg by mouth daily.      . tizanidine (ZANAFLEX) 2 MG capsule Take 2 mg by mouth 3 (three) times daily as needed for muscle spasms.       [name] warfarin (COUMADIN) 5 MG tablet Takes 2.5 mg on Mon and Thurs, Takes 5 mg on all other days. MANAGED BY LISA       No current facility-administered medications for this visit.  Past Medical History  Diagnosis Date  . Diarrhea   . Esophageal reflux   . OSA (obstructive sleep apnea)   . Fibromyalgia   . Nephrolithiasis   . Anxiety   . Mixed hyperlipidemia   . Essential hypertension, benign   . Colonoscopy causing post-procedural bleeding     Incomplete. by Dr. Jena Gauss 06/11/99 due to fixed non-compliant colon precluded exam to 40 cm, she had subsequent colectomy for diverticulitis since that time.   . Chronic back pain   . Hiatal hernia     Recent EGD/TCS showed small hiatal hernia, normal apperaring tubular esophagus. s/p passage of a 54-french Maloney dilator, s/p biopsy esophageal mucosa, multiple fundal gland type gastric polyps. one large pedunculated polp with oozing, noted s/p clipping and snare polypectomy. Biopsies of the gastric mucosa taken given her symptoms in her elevated count. normal D1-D3, s/p biospy D2-D3.   [name] Hiatal hernia     all bx unremarkable. on TCS she had left-sided diverticula,  evidence of prior segmental resection with anastomosis, multiple colonic polyps s/p multiple snare polypectomies, s/p segmental biopsy and stool sampling. she had multiple tubular adenomas. no microscopic/collagenous colitis. stool culture, c diff, O+P, lactoferrin were negative.   . Ureteral stent retained     Not retained. ureteral stent removal gross hematuria resolved 10/11. coumadin restarted.   . Type 2 diabetes mellitus   . Permanent atrial fibrillation     Previous failed DCCV  . Asthma   . Ventral hernia     Social History [name] reports that she has never smoked. She has never used smokeless tobacco. [name] reports that she does not drink alcohol.  Review of Systems Negative except as outlined.  Physical Examination Filed Vitals:   08/11/12 1300  BP: 143/76  Pulse: 76   Filed Weights   08/11/12 1300  Weight: 217 lb (98.431 kg)   Overweight woman in no acute distress. HEENT: Conjunctiva and lids normal, oropharynx clear. Neck: Supple, no elevated JVP or carotid bruits, no thyromegaly. Lungs: Clear to auscultation, nonlabored breathing at rest. Cardiac: Irregularly irregular, no S3 or significant systolic murmur. Abdomen: Soft, nontender,bowel sounds present. Extremities: Trace edema, distal pulses 2+. Skin: Warm and dry. Musculoskeletal: No kyphosis. Neuropsychiatric: Alert and oriented x3, affect grossly appropriate.   Problem List and Plan   Permanent atrial fibrillation Continue strategy of heart rate control and anticoagulation. Will review ECG when scanned into EPIC. Six-month followup arranged.  HYPERLIPIDEMIA-MIXED She continues on Crestor, followed by primary care.  Essential hypertension, benign Blood pressure mildly elevated today. No change to current regimen.    Jonelle Sidle, M.D., F.A.C.C.

## 2012-08-11 NOTE — Assessment & Plan Note (Signed)
Continue strategy of heart rate control and anticoagulation. Will review ECG when scanned into EPIC. Six-month followup arranged.

## 2012-08-11 NOTE — Assessment & Plan Note (Signed)
She continues on Crestor, followed by primary care. 

## 2012-08-11 NOTE — Pre-Procedure Instructions (Signed)
Consulted Dr. Jayme Cloud about what medications the pt should take preop. His recommendation was 1/2 dose of Lantus the night before surgery, but regular dose of byetta. No byetta the morning of surgery and may have clear liquids up to 4 hours before surgery or 1 4 oz juice if hypoglycemic episode occurs before arrival to short stay.

## 2012-08-11 NOTE — Assessment & Plan Note (Signed)
Blood pressure mildly elevated today. No change to current regimen.

## 2012-08-14 ENCOUNTER — Other Ambulatory Visit: Payer: Self-pay | Admitting: Physician Assistant

## 2012-08-14 MED ORDER — TETRACAINE HCL 0.5 % OP SOLN
OPHTHALMIC | Status: AC
  Filled 2012-08-14: qty 2

## 2012-08-14 MED ORDER — LIDOCAINE HCL (PF) 1 % IJ SOLN
INTRAMUSCULAR | Status: AC
  Filled 2012-08-14: qty 2

## 2012-08-14 MED ORDER — PHENYLEPHRINE HCL 2.5 % OP SOLN
OPHTHALMIC | Status: AC
  Filled 2012-08-14: qty 2

## 2012-08-14 MED ORDER — LIDOCAINE HCL 3.5 % OP GEL
OPHTHALMIC | Status: AC
  Filled 2012-08-14: qty 5

## 2012-08-14 MED ORDER — CYCLOPENTOLATE-PHENYLEPHRINE 0.2-1 % OP SOLN
OPHTHALMIC | Status: AC
  Filled 2012-08-14: qty 2

## 2012-08-15 ENCOUNTER — Other Ambulatory Visit: Payer: Self-pay | Admitting: Cardiology

## 2012-08-17 ENCOUNTER — Ambulatory Visit (HOSPITAL_COMMUNITY): Payer: Medicare Other | Admitting: Anesthesiology

## 2012-08-17 ENCOUNTER — Encounter (HOSPITAL_COMMUNITY): Payer: Self-pay | Admitting: Anesthesiology

## 2012-08-17 ENCOUNTER — Ambulatory Visit (HOSPITAL_COMMUNITY)
Admission: RE | Admit: 2012-08-17 | Discharge: 2012-08-17 | Disposition: A | Discharge status: Home / Self Care | Payer: Medicare Other | Source: Ambulatory Visit | Attending: Ophthalmology | Admitting: Ophthalmology

## 2012-08-17 ENCOUNTER — Encounter (HOSPITAL_COMMUNITY): Payer: Self-pay | Admitting: *Deleted

## 2012-08-17 ENCOUNTER — Encounter (HOSPITAL_COMMUNITY)
Admission: RE | Disposition: A | Discharge status: Home / Self Care | Payer: Self-pay | Source: Ambulatory Visit | Attending: Ophthalmology

## 2012-08-17 DIAGNOSIS — Z888 Allergy status to other drugs, medicaments and biological substances status: Secondary | ICD-10-CM | POA: Insufficient documentation

## 2012-08-17 DIAGNOSIS — G4730 Sleep apnea, unspecified: Secondary | ICD-10-CM | POA: Insufficient documentation

## 2012-08-17 DIAGNOSIS — I10 Essential (primary) hypertension: Secondary | ICD-10-CM | POA: Insufficient documentation

## 2012-08-17 DIAGNOSIS — E119 Type 2 diabetes mellitus without complications: Secondary | ICD-10-CM | POA: Insufficient documentation

## 2012-08-17 DIAGNOSIS — G709 Myoneural disorder, unspecified: Secondary | ICD-10-CM | POA: Insufficient documentation

## 2012-08-17 DIAGNOSIS — F411 Generalized anxiety disorder: Secondary | ICD-10-CM | POA: Insufficient documentation

## 2012-08-17 DIAGNOSIS — J45909 Unspecified asthma, uncomplicated: Secondary | ICD-10-CM | POA: Insufficient documentation

## 2012-08-17 DIAGNOSIS — H269 Unspecified cataract: Secondary | ICD-10-CM | POA: Diagnosis not present

## 2012-08-17 DIAGNOSIS — Z7901 Long term (current) use of anticoagulants: Secondary | ICD-10-CM | POA: Insufficient documentation

## 2012-08-17 DIAGNOSIS — Z794 Long term (current) use of insulin: Secondary | ICD-10-CM | POA: Insufficient documentation

## 2012-08-17 DIAGNOSIS — H2510 Age-related nuclear cataract, unspecified eye: Secondary | ICD-10-CM | POA: Diagnosis not present

## 2012-08-17 DIAGNOSIS — IMO0001 Reserved for inherently not codable concepts without codable children: Secondary | ICD-10-CM | POA: Insufficient documentation

## 2012-08-17 DIAGNOSIS — K219 Gastro-esophageal reflux disease without esophagitis: Secondary | ICD-10-CM | POA: Diagnosis not present

## 2012-08-17 DIAGNOSIS — I4891 Unspecified atrial fibrillation: Secondary | ICD-10-CM | POA: Diagnosis not present

## 2012-08-17 DIAGNOSIS — H2589 Other age-related cataract: Secondary | ICD-10-CM | POA: Diagnosis not present

## 2012-08-17 DIAGNOSIS — Z91013 Allergy to seafood: Secondary | ICD-10-CM | POA: Insufficient documentation

## 2012-08-17 DIAGNOSIS — N289 Disorder of kidney and ureter, unspecified: Secondary | ICD-10-CM | POA: Insufficient documentation

## 2012-08-17 DIAGNOSIS — Z79899 Other long term (current) drug therapy: Secondary | ICD-10-CM | POA: Insufficient documentation

## 2012-08-17 DIAGNOSIS — Z880 Allergy status to penicillin: Secondary | ICD-10-CM | POA: Insufficient documentation

## 2012-08-17 HISTORY — PX: CATARACT EXTRACTION W/PHACO: SHX586

## 2012-08-17 LAB — GLUCOSE, CAPILLARY: Glucose-Capillary: 122 mg/dL — ABNORMAL HIGH (ref 70–99)

## 2012-08-17 SURGERY — PHACOEMULSIFICATION, CATARACT, WITH IOL INSERTION
Anesthesia: Monitor Anesthesia Care | Site: Eye | Laterality: Left | Wound class: Clean

## 2012-08-17 MED ORDER — CYCLOPENTOLATE-PHENYLEPHRINE 0.2-1 % OP SOLN
1.0000 [drp] | OPHTHALMIC | Status: AC
  Administered 2012-08-17 (×3): 1 [drp] via OPHTHALMIC

## 2012-08-17 MED ORDER — MIDAZOLAM HCL 2 MG/2ML IJ SOLN
INTRAMUSCULAR | Status: AC
  Filled 2012-08-17: qty 2

## 2012-08-17 MED ORDER — LIDOCAINE HCL (PF) 1 % IJ SOLN
INTRAMUSCULAR | Status: DC | PRN
  Administered 2012-08-17: .8 mL

## 2012-08-17 MED ORDER — BSS IO SOLN
INTRAOCULAR | Status: DC | PRN
  Administered 2012-08-17: 15 mL via INTRAOCULAR

## 2012-08-17 MED ORDER — ONDANSETRON HCL 4 MG/2ML IJ SOLN: 4.0000 mg | Freq: Once | INTRAMUSCULAR | Status: DC | PRN

## 2012-08-17 MED ORDER — EPINEPHRINE HCL 1 MG/ML IJ SOLN
INTRAMUSCULAR | Status: AC
  Filled 2012-08-17: qty 1

## 2012-08-17 MED ORDER — LIDOCAINE HCL 3.5 % OP GEL
1.0000 "application " | Freq: Once | OPHTHALMIC | Status: AC
  Administered 2012-08-17: 1 via OPHTHALMIC

## 2012-08-17 MED ORDER — SODIUM HYALURONATE 10 MG/ML IO SOLN
INTRAOCULAR | Status: DC | PRN
  Administered 2012-08-17: 0.85 mL via INTRAOCULAR

## 2012-08-17 MED ORDER — TETRACAINE HCL 0.5 % OP SOLN
1.0000 [drp] | OPHTHALMIC | Status: AC
  Administered 2012-08-17 (×3): 1 [drp] via OPHTHALMIC

## 2012-08-17 MED ORDER — LACTATED RINGERS IV SOLN
INTRAVENOUS | Status: DC
  Administered 2012-08-17: 13:00:00 via INTRAVENOUS

## 2012-08-17 MED ORDER — EPINEPHRINE HCL 1 MG/ML IJ SOLN
INTRAOCULAR | Status: DC | PRN
  Administered 2012-08-17: 13:00:00

## 2012-08-17 MED ORDER — PHENYLEPHRINE HCL 2.5 % OP SOLN
1.0000 [drp] | OPHTHALMIC | Status: AC
  Administered 2012-08-17 (×3): 1 [drp] via OPHTHALMIC

## 2012-08-17 MED ORDER — NEOMYCIN-POLYMYXIN-DEXAMETH 0.1 % OP OINT
TOPICAL_OINTMENT | OPHTHALMIC | Status: DC | PRN
  Administered 2012-08-17: 1 via OPHTHALMIC

## 2012-08-17 MED ORDER — MIDAZOLAM HCL 2 MG/2ML IJ SOLN
1.0000 mg | INTRAMUSCULAR | Status: DC | PRN
  Administered 2012-08-17: 2 mg via INTRAVENOUS

## 2012-08-17 MED ORDER — LIDOCAINE 3.5 % OP GEL OPTIME - NO CHARGE
OPHTHALMIC | Status: DC | PRN
  Administered 2012-08-17: 1 [drp] via OPHTHALMIC

## 2012-08-17 MED ORDER — FENTANYL CITRATE 0.05 MG/ML IJ SOLN: 25.0000 ug | INTRAMUSCULAR | Status: DC | PRN

## 2012-08-17 SURGICAL SUPPLY — 31 items
CAPSULAR TENSION RING-AMO (OPHTHALMIC RELATED) IMPLANT
CLOTH BEACON ORANGE TIMEOUT ST (SAFETY) ×2 IMPLANT
EYE SHIELD UNIVERSAL CLEAR (GAUZE/BANDAGES/DRESSINGS) ×2 IMPLANT
GLOVE BIO SURGEON STRL SZ 6.5 (GLOVE) IMPLANT
GLOVE BIOGEL PI IND STRL 6.5 (GLOVE) ×1 IMPLANT
GLOVE BIOGEL PI IND STRL 7.0 (GLOVE) ×1 IMPLANT
GLOVE BIOGEL PI IND STRL 7.5 (GLOVE) IMPLANT
GLOVE BIOGEL PI INDICATOR 6.5 (GLOVE) ×1
GLOVE BIOGEL PI INDICATOR 7.0 (GLOVE) ×1
GLOVE BIOGEL PI INDICATOR 7.5 (GLOVE)
GLOVE ECLIPSE 6.5 STRL STRAW (GLOVE) IMPLANT
GLOVE ECLIPSE 7.0 STRL STRAW (GLOVE) IMPLANT
GLOVE ECLIPSE 7.5 STRL STRAW (GLOVE) IMPLANT
GLOVE EXAM NITRILE LRG STRL (GLOVE) IMPLANT
GLOVE EXAM NITRILE MD LF STRL (GLOVE) IMPLANT
GLOVE SKINSENSE NS SZ6.5 (GLOVE)
GLOVE SKINSENSE NS SZ7.0 (GLOVE)
GLOVE SKINSENSE STRL SZ6.5 (GLOVE) IMPLANT
GLOVE SKINSENSE STRL SZ7.0 (GLOVE) IMPLANT
KIT VITRECTOMY (OPHTHALMIC RELATED) IMPLANT
PAD ARMBOARD 7.5X6 YLW CONV (MISCELLANEOUS) ×2 IMPLANT
PROC W NO LENS (INTRAOCULAR LENS)
PROC W SPEC LENS (INTRAOCULAR LENS)
PROCESS W NO LENS (INTRAOCULAR LENS) IMPLANT
PROCESS W SPEC LENS (INTRAOCULAR LENS) IMPLANT
RING MALYGIN (MISCELLANEOUS) IMPLANT
SIGHTPATH CAT PROC W REG LENS (Ophthalmic Related) ×2 IMPLANT
SYR TB 1ML LL NO SAFETY (SYRINGE) ×2 IMPLANT
TAPE PAPER 2X10 WHT MICROPORE (GAUZE/BANDAGES/DRESSINGS) ×2 IMPLANT
VISCOELASTIC ADDITIONAL (OPHTHALMIC RELATED) IMPLANT
WATER STERILE IRR 250ML POUR (IV SOLUTION) ×2 IMPLANT

## 2012-08-17 NOTE — H&P (Signed)
I have reviewed the H&P, the patient was re-examined, and I have identified no interval changes in medical condition and plan of care since the history and physical of record  

## 2012-08-17 NOTE — Anesthesia Preprocedure Evaluation (Signed)
Anesthesia Evaluation  Patient identified by MRN, date of birth, ID band Patient awake    Reviewed: Allergy & Precautions, H&P , NPO status , Patient's Chart, lab work & pertinent test results, reviewed documented beta blocker date and time   Airway Mallampati: II      Dental  (+) Teeth Intact and Missing   Pulmonary asthma , sleep apnea ,  breath sounds clear to auscultation        Cardiovascular hypertension, Pt. on medications + dysrhythmias Atrial Fibrillation Rhythm:Irregular Rate:Normal     Neuro/Psych PSYCHIATRIC DISORDERS Anxiety  Neuromuscular disease    GI/Hepatic hiatal hernia, GERD-  ,  Endo/Other  diabetes, Type 2  Renal/GU Renal disease     Musculoskeletal   Abdominal   Peds  Hematology   Anesthesia Other Findings   Reproductive/Obstetrics                           Anesthesia Physical Anesthesia Plan  ASA: III  Anesthesia Plan: MAC   Post-op Pain Management:    Induction: Intravenous  Airway Management Planned: Nasal Cannula  Additional Equipment:   Intra-op Plan:   Post-operative Plan:   Informed Consent: I have reviewed the patients History and Physical, chart, labs and discussed the procedure including the risks, benefits and alternatives for the proposed anesthesia with the patient or authorized representative who has indicated his/her understanding and acceptance.     Plan Discussed with:   Anesthesia Plan Comments:         Anesthesia Quick Evaluation

## 2012-08-17 NOTE — Anesthesia Procedure Notes (Signed)
Procedure Name: MAC Date/Time: 08/17/2012 12:52 PM Performed by: Franco Nones Pre-anesthesia Checklist: Patient identified, Emergency Drugs available, Suction available, Timeout performed and Patient being monitored Patient Re-evaluated:Patient Re-evaluated prior to inductionOxygen Delivery Method: Nasal cannula

## 2012-08-17 NOTE — Op Note (Signed)
Date of Admission: 08/17/12  Date of Surgery: 08/17/12  Pre-Op Dx: Combined Cataract  Left  Eye  Post-Op Dx: Cataract Left Eye, Dx Code 366.19  Surgeon: Gemma Payor, M.D.  Assistants: None  Anesthesia: Topical with MAC  Indications: Painless, progressive loss of vision with compromise of daily activities.  Surgery: Cataract Extraction with Intraocular lens Implant Left Eye  Discription: The patient had dilating drops and viscous lidocaine placed into the left eye in the pre-op holding area. After transfer to the operating room, a time out was performed. The patient was then prepped and draped. Beginning with a 75 degree blade a paracentesis port was made at the surgeon's 2 o'clock position. The anterior chamber was then filled with 1% non-preserved lidocaine. This was followed by filling the anterior chamber with Provisc. A bent cystatome needle was used to create a continuous tear capsulotomy. Hydrodissection was performed with balanced salt solution on a Fine canula. The lens nucleus was then removed using the phacoemulsification handpiece. Residual cortex was removed with the I&A handpiece. The anterior chamber and capsular bag were refilled with Provisc. A posterior chamber intraocular lens was placed into the capsular bag with it's injector. The implant was positioned with the Kuglan hook. The Provisc was then removed from the anterior chamber and capsular bag with the I&A handpiece. Stromal hydration of the main incision and paracentesis port was performed with BSS on a Fine canula. The wounds were tested for leak which was negative. The patient tolerated the procedure well. There were no operative complications. The patient was then transferred to the recovery room in stable condition.  Prosthetic device:  B&L enVista, MX60, power 25.5D.  Specimen: None  EBL: None  Complications: None

## 2012-08-17 NOTE — Anesthesia Postprocedure Evaluation (Signed)
  Anesthesia Post-op Note  Patient: [name]  Procedure(s) Performed: Procedure(s) (LRB): CATARACT EXTRACTION PHACO AND INTRAOCULAR LENS PLACEMENT (IOC) (Left)  Patient Location:  Short Stay  Anesthesia Type: MAC  Level of Consciousness: awake  Airway and Oxygen Therapy: Patient Spontanous Breathing  Post-op Pain: none  Post-op Assessment: Post-op Vital signs reviewed, Patient's Cardiovascular Status Stable, Respiratory Function Stable, Patent Airway, No signs of Nausea or vomiting and Pain level controlled  Post-op Vital Signs: Reviewed and stable  Complications: No apparent anesthesia complications

## 2012-08-17 NOTE — Transfer of Care (Signed)
Immediate Anesthesia Transfer of Care Note  Patient: [name]  Procedure(s) Performed: Procedure(s) (LRB): CATARACT EXTRACTION PHACO AND INTRAOCULAR LENS PLACEMENT (IOC) (Left)  Patient Location: Shortstay  Anesthesia Type: MAC  Level of Consciousness: awake  Airway & Oxygen Therapy: Patient Spontanous Breathing   Post-op Assessment: Report given to PACU RN, Post -op Vital signs reviewed and stable and Patient moving all extremities  Post vital signs: Reviewed and stable  Complications: No apparent anesthesia complications

## 2012-08-19 ENCOUNTER — Encounter (HOSPITAL_COMMUNITY): Payer: Self-pay | Admitting: Ophthalmology

## 2012-09-08 DIAGNOSIS — IMO0001 Reserved for inherently not codable concepts without codable children: Secondary | ICD-10-CM | POA: Diagnosis not present

## 2012-09-08 DIAGNOSIS — G894 Chronic pain syndrome: Secondary | ICD-10-CM | POA: Diagnosis not present

## 2012-09-08 DIAGNOSIS — Z79899 Other long term (current) drug therapy: Secondary | ICD-10-CM | POA: Diagnosis not present

## 2012-09-08 DIAGNOSIS — M79609 Pain in unspecified limb: Secondary | ICD-10-CM | POA: Diagnosis not present

## 2012-09-08 DIAGNOSIS — M62838 Other muscle spasm: Secondary | ICD-10-CM | POA: Diagnosis not present

## 2012-09-22 ENCOUNTER — Ambulatory Visit (INDEPENDENT_AMBULATORY_CARE_PROVIDER_SITE_OTHER): Payer: Medicare Other | Admitting: *Deleted

## 2012-09-22 DIAGNOSIS — I4821 Permanent atrial fibrillation: Secondary | ICD-10-CM

## 2012-09-22 DIAGNOSIS — Z7901 Long term (current) use of anticoagulants: Secondary | ICD-10-CM

## 2012-09-22 DIAGNOSIS — I4891 Unspecified atrial fibrillation: Secondary | ICD-10-CM

## 2012-09-22 LAB — POCT INR: INR: 3

## 2012-10-01 DIAGNOSIS — Z79899 Other long term (current) drug therapy: Secondary | ICD-10-CM | POA: Diagnosis not present

## 2012-10-01 DIAGNOSIS — M62838 Other muscle spasm: Secondary | ICD-10-CM | POA: Diagnosis not present

## 2012-10-01 DIAGNOSIS — IMO0001 Reserved for inherently not codable concepts without codable children: Secondary | ICD-10-CM | POA: Diagnosis not present

## 2012-10-01 DIAGNOSIS — G894 Chronic pain syndrome: Secondary | ICD-10-CM | POA: Diagnosis not present

## 2012-10-05 ENCOUNTER — Emergency Department (HOSPITAL_COMMUNITY)
Admission: EM | Admit: 2012-10-05 | Discharge: 2012-10-05 | Disposition: A | Discharge status: Home / Self Care | Payer: Medicare Other | Attending: Emergency Medicine | Admitting: Emergency Medicine

## 2012-10-05 ENCOUNTER — Encounter (HOSPITAL_COMMUNITY): Payer: Self-pay | Admitting: *Deleted

## 2012-10-05 ENCOUNTER — Emergency Department (HOSPITAL_COMMUNITY): Payer: Medicare Other

## 2012-10-05 DIAGNOSIS — S46909A Unspecified injury of unspecified muscle, fascia and tendon at shoulder and upper arm level, unspecified arm, initial encounter: Secondary | ICD-10-CM | POA: Insufficient documentation

## 2012-10-05 DIAGNOSIS — IMO0001 Reserved for inherently not codable concepts without codable children: Secondary | ICD-10-CM | POA: Insufficient documentation

## 2012-10-05 DIAGNOSIS — E119 Type 2 diabetes mellitus without complications: Secondary | ICD-10-CM | POA: Insufficient documentation

## 2012-10-05 DIAGNOSIS — R071 Chest pain on breathing: Secondary | ICD-10-CM | POA: Diagnosis not present

## 2012-10-05 DIAGNOSIS — Z8719 Personal history of other diseases of the digestive system: Secondary | ICD-10-CM | POA: Diagnosis not present

## 2012-10-05 DIAGNOSIS — Z794 Long term (current) use of insulin: Secondary | ICD-10-CM | POA: Diagnosis not present

## 2012-10-05 DIAGNOSIS — S4980XA Other specified injuries of shoulder and upper arm, unspecified arm, initial encounter: Secondary | ICD-10-CM | POA: Insufficient documentation

## 2012-10-05 DIAGNOSIS — Z79899 Other long term (current) drug therapy: Secondary | ICD-10-CM | POA: Diagnosis not present

## 2012-10-05 DIAGNOSIS — G4733 Obstructive sleep apnea (adult) (pediatric): Secondary | ICD-10-CM | POA: Diagnosis not present

## 2012-10-05 DIAGNOSIS — M549 Dorsalgia, unspecified: Secondary | ICD-10-CM | POA: Diagnosis not present

## 2012-10-05 DIAGNOSIS — E782 Mixed hyperlipidemia: Secondary | ICD-10-CM | POA: Insufficient documentation

## 2012-10-05 DIAGNOSIS — I10 Essential (primary) hypertension: Secondary | ICD-10-CM | POA: Diagnosis not present

## 2012-10-05 DIAGNOSIS — Y929 Unspecified place or not applicable: Secondary | ICD-10-CM | POA: Insufficient documentation

## 2012-10-05 DIAGNOSIS — Z880 Allergy status to penicillin: Secondary | ICD-10-CM | POA: Diagnosis not present

## 2012-10-05 DIAGNOSIS — Z7901 Long term (current) use of anticoagulants: Secondary | ICD-10-CM | POA: Insufficient documentation

## 2012-10-05 DIAGNOSIS — X500XXA Overexertion from strenuous movement or load, initial encounter: Secondary | ICD-10-CM | POA: Insufficient documentation

## 2012-10-05 DIAGNOSIS — Z87828 Personal history of other (healed) physical injury and trauma: Secondary | ICD-10-CM | POA: Insufficient documentation

## 2012-10-05 DIAGNOSIS — Z87442 Personal history of urinary calculi: Secondary | ICD-10-CM | POA: Diagnosis not present

## 2012-10-05 DIAGNOSIS — G8929 Other chronic pain: Secondary | ICD-10-CM | POA: Diagnosis not present

## 2012-10-05 DIAGNOSIS — K219 Gastro-esophageal reflux disease without esophagitis: Secondary | ICD-10-CM | POA: Diagnosis not present

## 2012-10-05 DIAGNOSIS — Z862 Personal history of diseases of the blood and blood-forming organs and certain disorders involving the immune mechanism: Secondary | ICD-10-CM | POA: Insufficient documentation

## 2012-10-05 DIAGNOSIS — M19019 Primary osteoarthritis, unspecified shoulder: Secondary | ICD-10-CM | POA: Diagnosis not present

## 2012-10-05 DIAGNOSIS — Y9389 Activity, other specified: Secondary | ICD-10-CM | POA: Insufficient documentation

## 2012-10-05 DIAGNOSIS — I4891 Unspecified atrial fibrillation: Secondary | ICD-10-CM | POA: Diagnosis not present

## 2012-10-05 DIAGNOSIS — F411 Generalized anxiety disorder: Secondary | ICD-10-CM | POA: Insufficient documentation

## 2012-10-05 MED ORDER — HYDROCODONE-ACETAMINOPHEN 5-325 MG PO TABS: ORAL_TABLET | ORAL | Status: DC

## 2012-10-05 MED ORDER — HYDROCODONE-ACETAMINOPHEN 5-325 MG PO TABS
1.0000 | ORAL_TABLET | Freq: Once | ORAL | Status: AC
  Administered 2012-10-05: 1 via ORAL
  Filled 2012-10-05: qty 1

## 2012-10-05 NOTE — ED Notes (Signed)
Pt ambulated to restroom. 

## 2012-10-05 NOTE — ED Notes (Signed)
States was reaching for something last night when heard a "pop".  C/o pain to left shoulder with limited ROM.

## 2012-10-05 NOTE — ED Provider Notes (Signed)
History    CSN: [id number] Arrival date & time 10/05/12  4098  First MD Initiated Contact with Patient 10/05/12 1215     Chief Complaint  Patient presents with  . Shoulder Pain   (Consider location/radiation/quality/duration/timing/severity/associated sxs/prior Treatment) HPI Comments: [name] is a 78 y.o. female who presents to the Emergency Department complaining of left shoulder pain.  States she reached up to adjust her pillow and felt a "pop" in her shoulder.  Now c/o pain with attempted abduction or rotation of the left arm.  Pain resolves when the left arm is held close to the body.  She denies chest pain, shortness of breath, neck pain, headache, of numbness or weakness to the extremity.    Patient is a 78 y.o. female presenting with shoulder injury. The history is provided by the patient.  Shoulder Injury This is a new problem. Episode onset: several hours prior to ED arrival. The problem occurs constantly. The problem has been unchanged. Associated symptoms include arthralgias. Pertinent negatives include no abdominal pain, chest pain, chills, coughing, diaphoresis, fever, headaches, joint swelling, neck pain, numbness, rash, vertigo, visual change, vomiting or weakness. The symptoms are aggravated by bending (movement of the left arm). She has tried ice and acetaminophen for the symptoms. The treatment provided mild relief.   Past Medical History  Diagnosis Date  . Diarrhea   . Esophageal reflux   . OSA (obstructive sleep apnea)   . Fibromyalgia   . Nephrolithiasis   . Anxiety   . Mixed hyperlipidemia   . Essential hypertension, benign   . Colonoscopy causing post-procedural bleeding     Incomplete. by Dr. Jena Gauss 06/11/99 due to fixed non-compliant colon precluded exam to 40 cm, she had subsequent colectomy for diverticulitis since that time.   . Chronic back pain   . Hiatal hernia     Recent EGD/TCS showed small hiatal hernia, normal apperaring tubular esophagus.  s/p passage of a 54-french Maloney dilator, s/p biopsy esophageal mucosa, multiple fundal gland type gastric polyps. one large pedunculated polp with oozing, noted s/p clipping and snare polypectomy. Biopsies of the gastric mucosa taken given her symptoms in her elevated count. normal D1-D3, s/p biospy D2-D3.   [name] Hiatal hernia     all bx unremarkable. on TCS she had left-sided diverticula, evidence of prior segmental resection with anastomosis, multiple colonic polyps s/p multiple snare polypectomies, s/p segmental biopsy and stool sampling. she had multiple tubular adenomas. no microscopic/collagenous colitis. stool culture, c diff, O+P, lactoferrin were negative.   . Ureteral stent retained     Not retained. ureteral stent removal gross hematuria resolved 10/11. coumadin restarted.   . Type 2 diabetes mellitus   . Permanent atrial fibrillation     Previous failed DCCV  . Asthma   . Ventral hernia    Past Surgical History  Procedure Laterality Date  . Total abdominal hysterectomy    . Tonsillectomy    . Colectomy      2002. Dr. Salvadore Dom for diverticulitis  . Esophagogastroduodenoscopy  02/22/09    normal s/p dilator;small HH/one large pedunculates polyp  s/p clipping, hyperplastic  . Colonoscopy  02/22/09    normal/left-sided diverticula/multiple colonic polyp, adenomatous  . Breast lumpectomy      benign cyst  . Colonoscopy  12/16/2011    Procedure: COLONOSCOPY;  Surgeon: Corbin Ade, MD;  Location: [facility];  Service: Endoscopy;  Laterality: N/A;  10:00/Give Phenergan 12.5 mg IV 30 min prior to procedure  . Cataract extraction  w/phaco Left 08/17/2012    Procedure: CATARACT EXTRACTION PHACO AND INTRAOCULAR LENS PLACEMENT (IOC);  Surgeon: Gemma Payor, MD;  Location: [facility];  Service: Ophthalmology;  Laterality: Left;  CDE:18.73   Family History  Problem Relation Age of Onset  . Coronary artery disease      Family Hx   . Colon cancer Maternal Grandfather    History  Substance  Use Topics  . Smoking status: Never Smoker   . Smokeless tobacco: Never Used     Comment: tobacco use - no  . Alcohol Use: No   OB History   Grav Para Term Preterm Abortions TAB SAB Ect Mult Living                 Review of Systems  Constitutional: Negative for fever, chills and diaphoresis.  HENT: Negative for neck pain.   Eyes: Negative for visual disturbance.  Respiratory: Negative for cough and chest tightness.   Cardiovascular: Negative for chest pain.  Gastrointestinal: Negative for vomiting and abdominal pain.  Genitourinary: Negative for dysuria and difficulty urinating.  Musculoskeletal: Positive for arthralgias. Negative for joint swelling.  Skin: Negative for color change, rash and wound.  Neurological: Negative for dizziness, vertigo, weakness, numbness and headaches.  All other systems reviewed and are negative.    Allergies  Adhesive; Iohexol; Meperidine hcl; Shellfish allergy; and Penicillins  Home Medications   Current Outpatient Rx  Name  Route  Sig  Dispense  Refill  . acetaminophen (TYLENOL) 325 MG tablet   Oral   Take 650 mg by mouth every 6 (six) hours as needed for pain.          [name] ALPRAZolam (XANAX) 1 MG tablet   Oral   Take 1 mg by mouth 3 (three) times daily as needed for anxiety.          [name] amLODipine (NORVASC) 10 MG tablet   Oral   Take 10 mg by mouth daily.         [name] BYETTA 10 MCG PEN 10 MCG/0.04ML SOLN   Subcutaneous   Inject 10 mcg into the skin Twice daily.         . cloNIDine (CATAPRES - DOSED IN MG/24 HR) 0.1 mg/24hr patch   Transdermal   Place 1 patch onto the skin once a week. Switches on Sunday         . esomeprazole (NEXIUM) 40 MG capsule   Oral   Take 40 mg by mouth 2 (two) times daily.           . hydrochlorothiazide 50 MG tablet   Oral   Take 50 mg by mouth daily.          . insulin glargine (LANTUS) 100 UNIT/ML injection   Subcutaneous   Inject 50 Units into the skin at bedtime.          .  insulin lispro (HUMALOG) 100 UNIT/ML injection   Subcutaneous   Inject 10-11 Units into the skin 3 (three) times daily.          . metoprolol (LOPRESSOR) 50 MG tablet   Oral   Take 50 mg by mouth 2 (two) times daily.          . ondansetron (ZOFRAN-ODT) 4 MG disintegrating tablet   Oral   Take 4 mg by mouth every 8 (eight) hours as needed. Nausea and Vomiting         . oxyCODONE (ROXICODONE) 15 MG immediate release tablet   Oral  Take 15 mg by mouth every 6 (six) hours as needed. Pain         . rosuvastatin (CRESTOR) 10 MG tablet   Oral   Take 10 mg by mouth daily.         . tizanidine (ZANAFLEX) 2 MG capsule   Oral   Take 2 mg by mouth 3 (three) times daily as needed for muscle spasms.          [name] warfarin (COUMADIN) 5 MG tablet   Oral   Take 2.5-5 mg by mouth daily. Takes 2.5 mg on Mon and Thurs, Takes 5 mg on all other days. MANAGED BY LISA         . albuterol (PROAIR HFA) 108 (90 BASE) MCG/ACT inhaler   Inhalation   Inhale 2 puffs into the lungs every 4 (four) hours as needed. Shortness of Breath         . lidocaine (LIDODERM) 5 %   Transdermal   Place 1 patch onto the skin daily as needed (Pain). Remove & Discard patch within 12 hours or as directed by MD         . nitroGLYCERIN (NITROSTAT) 0.4 MG SL tablet   Sublingual   Place 0.4 mg under the tongue every 5 (five) minutes as needed. Chest Pains          BP 130/74  Pulse 79  Temp(Src) 97.7 F (36.5 C) (Oral)  Resp 20  Ht 5\' 3"  (1.6 m)  Wt 213 lb (96.616 kg)  BMI 37.74 kg/m2  SpO2 96% Physical Exam  Nursing note and vitals reviewed. Constitutional: She is oriented to person, place, and time. She appears well-developed and well-nourished. No distress.  HENT:  Head: Normocephalic and atraumatic.  Neck: Normal range of motion and phonation normal. Neck supple. No spinous process tenderness and no muscular tenderness present. No thyromegaly present.  Cardiovascular: Normal rate, regular  rhythm, normal heart sounds and intact distal pulses.   No murmur heard. Pulmonary/Chest: Effort normal and breath sounds normal. No respiratory distress. She exhibits no tenderness.  Musculoskeletal: She exhibits tenderness. She exhibits no edema.       Left shoulder: She exhibits decreased range of motion and tenderness. She exhibits no swelling, no effusion, no crepitus, no deformity, no laceration, no spasm, normal pulse and normal strength.       Arms: ttp of the left anterior shoulder and upper left chest wall.  Pain with abduction of the left arm and rotation of the shoulder.  Radial pulse is brisk, distal sensation intact, CR< 2 sec.  No abrasions, edema or deformity of the joint.   Lymphadenopathy:    She has no cervical adenopathy.  Neurological: She is alert and oriented to person, place, and time. She exhibits normal muscle tone. Coordination normal.  Skin: Skin is warm and dry.    ED Course  Procedures (including critical care time) Labs Reviewed - No data to display Dg Shoulder Left  10/05/2012   *RADIOLOGY REPORT*  Clinical Data: Left shoulder pain  LEFT SHOULDER - 2+ VIEW  Comparison: Left shoulder films of 12/16/2005  Findings: The left humeral head is in normal position within the glenohumeral joint space.  No acute fracture is seen.  As noted previously, there is mild degenerative change of the left [facility] joint which is normally aligned.  IMPRESSION: Mild degenerative change of the left AC joint.  No acute abnormality.   Original Report Authenticated By: Dwyane Dee, M.D.     MDM  Date: 10/05/2012  Rate: 79  Rhythm: atrial fibrillation  QRS Axis: indeterminate  Intervals: intervals  ST/T Wave abnormalities: normal  Conduction Disutrbances:none  Narrative Interpretation:   Old EKG Reviewed: unchanged    EKG reviewed by DR. I Knapp  Patient has pain that is reproduced with left arm abduction and rotation of the left shoulder she also has mild ttp at the upper  left chest wall.  Hx of previous rotator cuff injury to the right shoulder.  Pain feels similar. Doubt cardiac or infectious process.     She agrees to ice, elevate and close f/u with orthopedics, referral info given.  Amado Andal L. Trisha Mangle, PA-C 10/06/12 2146

## 2012-10-05 NOTE — ED Notes (Signed)
Family out to desk asking why pt had not been seen by MD. EDP notified of family concerns.

## 2012-10-07 NOTE — ED Provider Notes (Signed)
Medical screening examination/treatment/procedure(s) were performed by non-physician practitioner and as supervising physician I was immediately available for consultation/collaboration. Devoria Albe, MD, Armando Gang   Ward Givens, MD 10/07/12 [phone]

## 2012-11-03 ENCOUNTER — Ambulatory Visit (INDEPENDENT_AMBULATORY_CARE_PROVIDER_SITE_OTHER): Payer: Medicare Other | Admitting: *Deleted

## 2012-11-03 DIAGNOSIS — I4891 Unspecified atrial fibrillation: Secondary | ICD-10-CM | POA: Diagnosis not present

## 2012-11-03 DIAGNOSIS — I4821 Permanent atrial fibrillation: Secondary | ICD-10-CM

## 2012-11-03 DIAGNOSIS — Z7901 Long term (current) use of anticoagulants: Secondary | ICD-10-CM | POA: Diagnosis not present

## 2012-11-05 DIAGNOSIS — Z79899 Other long term (current) drug therapy: Secondary | ICD-10-CM | POA: Diagnosis not present

## 2012-11-05 DIAGNOSIS — G894 Chronic pain syndrome: Secondary | ICD-10-CM | POA: Diagnosis not present

## 2012-11-05 DIAGNOSIS — E785 Hyperlipidemia, unspecified: Secondary | ICD-10-CM | POA: Diagnosis not present

## 2012-11-05 DIAGNOSIS — M62838 Other muscle spasm: Secondary | ICD-10-CM | POA: Diagnosis not present

## 2012-11-05 DIAGNOSIS — IMO0001 Reserved for inherently not codable concepts without codable children: Secondary | ICD-10-CM | POA: Diagnosis not present

## 2012-11-11 DIAGNOSIS — E559 Vitamin D deficiency, unspecified: Secondary | ICD-10-CM | POA: Diagnosis not present

## 2012-11-11 DIAGNOSIS — E785 Hyperlipidemia, unspecified: Secondary | ICD-10-CM | POA: Diagnosis not present

## 2012-11-11 DIAGNOSIS — I10 Essential (primary) hypertension: Secondary | ICD-10-CM | POA: Diagnosis not present

## 2012-11-11 DIAGNOSIS — E669 Obesity, unspecified: Secondary | ICD-10-CM | POA: Diagnosis not present

## 2012-11-11 DIAGNOSIS — E1129 Type 2 diabetes mellitus with other diabetic kidney complication: Secondary | ICD-10-CM | POA: Diagnosis not present

## 2012-11-13 ENCOUNTER — Other Ambulatory Visit: Payer: Self-pay | Admitting: Cardiology

## 2012-11-18 DIAGNOSIS — E785 Hyperlipidemia, unspecified: Secondary | ICD-10-CM | POA: Diagnosis not present

## 2012-11-18 DIAGNOSIS — E119 Type 2 diabetes mellitus without complications: Secondary | ICD-10-CM | POA: Diagnosis not present

## 2012-12-03 DIAGNOSIS — G894 Chronic pain syndrome: Secondary | ICD-10-CM | POA: Diagnosis not present

## 2012-12-03 DIAGNOSIS — Z79899 Other long term (current) drug therapy: Secondary | ICD-10-CM | POA: Diagnosis not present

## 2012-12-03 DIAGNOSIS — IMO0001 Reserved for inherently not codable concepts without codable children: Secondary | ICD-10-CM | POA: Diagnosis not present

## 2012-12-03 DIAGNOSIS — M62838 Other muscle spasm: Secondary | ICD-10-CM | POA: Diagnosis not present

## 2012-12-08 ENCOUNTER — Ambulatory Visit (INDEPENDENT_AMBULATORY_CARE_PROVIDER_SITE_OTHER): Payer: Medicare Other | Admitting: *Deleted

## 2012-12-08 DIAGNOSIS — I4821 Permanent atrial fibrillation: Secondary | ICD-10-CM

## 2012-12-08 DIAGNOSIS — I4891 Unspecified atrial fibrillation: Secondary | ICD-10-CM

## 2012-12-08 DIAGNOSIS — Z7901 Long term (current) use of anticoagulants: Secondary | ICD-10-CM | POA: Diagnosis not present

## 2012-12-29 ENCOUNTER — Ambulatory Visit (INDEPENDENT_AMBULATORY_CARE_PROVIDER_SITE_OTHER): Payer: Medicare Other | Admitting: *Deleted

## 2012-12-29 DIAGNOSIS — Z7901 Long term (current) use of anticoagulants: Secondary | ICD-10-CM | POA: Diagnosis not present

## 2012-12-29 DIAGNOSIS — I4821 Permanent atrial fibrillation: Secondary | ICD-10-CM

## 2012-12-29 DIAGNOSIS — I4891 Unspecified atrial fibrillation: Secondary | ICD-10-CM

## 2013-01-05 DIAGNOSIS — IMO0001 Reserved for inherently not codable concepts without codable children: Secondary | ICD-10-CM | POA: Diagnosis not present

## 2013-01-05 DIAGNOSIS — M62838 Other muscle spasm: Secondary | ICD-10-CM | POA: Diagnosis not present

## 2013-01-05 DIAGNOSIS — G894 Chronic pain syndrome: Secondary | ICD-10-CM | POA: Diagnosis not present

## 2013-01-05 DIAGNOSIS — Z79899 Other long term (current) drug therapy: Secondary | ICD-10-CM | POA: Diagnosis not present

## 2013-02-05 DIAGNOSIS — Z79899 Other long term (current) drug therapy: Secondary | ICD-10-CM | POA: Diagnosis not present

## 2013-02-05 DIAGNOSIS — M62838 Other muscle spasm: Secondary | ICD-10-CM | POA: Diagnosis not present

## 2013-02-05 DIAGNOSIS — E559 Vitamin D deficiency, unspecified: Secondary | ICD-10-CM | POA: Diagnosis not present

## 2013-02-05 DIAGNOSIS — G894 Chronic pain syndrome: Secondary | ICD-10-CM | POA: Diagnosis not present

## 2013-02-05 DIAGNOSIS — IMO0001 Reserved for inherently not codable concepts without codable children: Secondary | ICD-10-CM | POA: Diagnosis not present

## 2013-02-05 DIAGNOSIS — E785 Hyperlipidemia, unspecified: Secondary | ICD-10-CM | POA: Diagnosis not present

## 2013-02-11 ENCOUNTER — Ambulatory Visit (INDEPENDENT_AMBULATORY_CARE_PROVIDER_SITE_OTHER): Payer: Medicare Other | Admitting: *Deleted

## 2013-02-11 ENCOUNTER — Ambulatory Visit (INDEPENDENT_AMBULATORY_CARE_PROVIDER_SITE_OTHER): Payer: Medicare Other | Admitting: Cardiology

## 2013-02-11 ENCOUNTER — Encounter: Payer: Self-pay | Admitting: Cardiology

## 2013-02-11 VITALS — BP 138/76 | HR 88 | Ht 63.0 in | Wt 212.0 lb

## 2013-02-11 DIAGNOSIS — I10 Essential (primary) hypertension: Secondary | ICD-10-CM | POA: Diagnosis not present

## 2013-02-11 DIAGNOSIS — Z7901 Long term (current) use of anticoagulants: Secondary | ICD-10-CM

## 2013-02-11 DIAGNOSIS — I4821 Permanent atrial fibrillation: Secondary | ICD-10-CM

## 2013-02-11 DIAGNOSIS — I4891 Unspecified atrial fibrillation: Secondary | ICD-10-CM

## 2013-02-11 NOTE — Patient Instructions (Signed)
Continue all current medications. Your physician wants you to follow up in: 6 months.  You will receive a reminder letter in the mail one-two months in advance.  If you don't receive a letter, please call our office to schedule the follow up appointment   

## 2013-02-11 NOTE — Assessment & Plan Note (Signed)
No change to antihypertensive regimen. 

## 2013-02-11 NOTE — Assessment & Plan Note (Signed)
Continue strategy of heart rate control and anticoagulation. 

## 2013-02-11 NOTE — Progress Notes (Signed)
Clinical Summary Ms. Lovering is a 78 y.o.female last seen in May. She reports no progressive sense of palpitations, no chest pain or unusual breathlessness. States that she has been compliant with her medications, no major bleeding problems.  Lab work from May showed potassium 4.5, BUN 23, creatinine 1.4, hemoglobin 14.3.   Allergies  Allergen Reactions  . Adhesive [Tape] Itching    Pulls skin off.   . Iohexol Nausea And Vomiting  . Meperidine Hcl Nausea And Vomiting  . Shellfish Allergy Diarrhea and Nausea And Vomiting  . Penicillins Nausea And Vomiting and Rash    Current Outpatient Prescriptions  Medication Sig Dispense Refill  . acetaminophen (TYLENOL) 325 MG tablet Take 650 mg by mouth every 6 (six) hours as needed for pain.       [name] albuterol (PROAIR HFA) 108 (90 BASE) MCG/ACT inhaler Inhale 2 puffs into the lungs every 4 (four) hours as needed. Shortness of Breath      . ALPRAZolam (XANAX) 1 MG tablet Take 1 mg by mouth 3 (three) times daily as needed for anxiety.       [name] amLODipine (NORVASC) 10 MG tablet Take 10 mg by mouth daily.      [name] BYETTA 10 MCG PEN 10 MCG/0.04ML SOLN Inject 10 mcg into the skin Twice daily.      . cloNIDine (CATAPRES - DOSED IN MG/24 HR) 0.1 mg/24hr patch Place 1 patch onto the skin once a week. Switches on Sunday      . esomeprazole (NEXIUM) 40 MG capsule Take 40 mg by mouth 2 (two) times daily.        . hydrochlorothiazide 50 MG tablet Take 50 mg by mouth daily.       . insulin glargine (LANTUS) 100 UNIT/ML injection Inject 50 Units into the skin at bedtime.       . insulin lispro (HUMALOG) 100 UNIT/ML injection Inject 10-11 Units into the skin 3 (three) times daily.       [name] lidocaine (LIDODERM) 5 % Place 1 patch onto the skin daily as needed (Pain). Remove & Discard patch within 12 hours or as directed by MD      . metoprolol (LOPRESSOR) 50 MG tablet Take 50 mg by mouth 2 (two) times daily.       . nitroGLYCERIN (NITROSTAT) 0.4 MG SL tablet Place  0.4 mg under the tongue every 5 (five) minutes as needed. Chest Pains      . ondansetron (ZOFRAN-ODT) 4 MG disintegrating tablet Take 4 mg by mouth every 8 (eight) hours as needed. Nausea and Vomiting      . oxyCODONE (ROXICODONE) 15 MG immediate release tablet Take 15 mg by mouth every 6 (six) hours as needed. Pain      . rosuvastatin (CRESTOR) 10 MG tablet Take 10 mg by mouth daily.      . tizanidine (ZANAFLEX) 2 MG capsule Take 2 mg by mouth 3 (three) times daily as needed for muscle spasms.       [name] warfarin (COUMADIN) 5 MG tablet Take 2.5-5 mg by mouth daily. Takes 2.5 mg on Mon and Thurs, Takes 5 mg on all other days. MANAGED BY LISA       No current facility-administered medications for this visit.    Past Medical History  Diagnosis Date  . Diarrhea   . Esophageal reflux   . OSA (obstructive sleep apnea)   . Fibromyalgia   . Nephrolithiasis   . Anxiety   . Mixed hyperlipidemia   .  Essential hypertension, benign   . Colonoscopy causing post-procedural bleeding     Incomplete. by Dr. Jena Gauss 06/11/99 due to fixed non-compliant colon precluded exam to 40 cm, she had subsequent colectomy for diverticulitis since that time.   . Chronic back pain   . Hiatal hernia     Recent EGD/TCS showed small hiatal hernia, normal apperaring tubular esophagus. s/p passage of a 54-french Maloney dilator, s/p biopsy esophageal mucosa, multiple fundal gland type gastric polyps. one large pedunculated polp with oozing, noted s/p clipping and snare polypectomy. Biopsies of the gastric mucosa taken given her symptoms in her elevated count. normal D1-D3, s/p biospy D2-D3.   [name] Hiatal hernia     all bx unremarkable. on TCS she had left-sided diverticula, evidence of prior segmental resection with anastomosis, multiple colonic polyps s/p multiple snare polypectomies, s/p segmental biopsy and stool sampling. she had multiple tubular adenomas. no microscopic/collagenous colitis. stool culture, c diff, O+P, lactoferrin  were negative.   . Ureteral stent retained     Not retained. ureteral stent removal gross hematuria resolved 10/11. coumadin restarted.   . Type 2 diabetes mellitus   . Permanent atrial fibrillation     Previous failed DCCV  . Asthma   . Ventral hernia     Social History Ms. Sayler reports that she has never smoked. She has never used smokeless tobacco. Ms. Ariel reports that she does not drink alcohol.  Review of Systems Chronic problems with nausea. Stable appetite. No dizziness or syncope. Otherwise negative.  Physical Examination Filed Vitals:   02/11/13 1350  BP: 138/76  Pulse: 88   Filed Weights   02/11/13 1350  Weight: 212 lb (96.163 kg)    Overweight woman in no acute distress.  HEENT: Conjunctiva and lids normal, oropharynx clear.  Neck: Supple, no elevated JVP or carotid bruits, no thyromegaly.  Lungs: Clear to auscultation, nonlabored breathing at rest.  Cardiac: Irregularly irregular, no S3 or significant systolic murmur.  Abdomen: Soft, nontender,bowel sounds present.  Extremities: Trace edema, distal pulses 2+.    Problem List and Plan   Permanent atrial fibrillation Continue strategy of heart rate control and anticoagulation.  Essential hypertension, benign No change to antihypertensive regimen.    Jonelle Sidle, M.D., F.A.C.C.

## 2013-02-12 DIAGNOSIS — E785 Hyperlipidemia, unspecified: Secondary | ICD-10-CM | POA: Diagnosis not present

## 2013-02-12 DIAGNOSIS — E1129 Type 2 diabetes mellitus with other diabetic kidney complication: Secondary | ICD-10-CM | POA: Diagnosis not present

## 2013-02-12 DIAGNOSIS — E669 Obesity, unspecified: Secondary | ICD-10-CM | POA: Diagnosis not present

## 2013-02-12 DIAGNOSIS — I10 Essential (primary) hypertension: Secondary | ICD-10-CM | POA: Diagnosis not present

## 2013-02-16 ENCOUNTER — Other Ambulatory Visit: Payer: Self-pay | Admitting: Cardiology

## 2013-02-23 DIAGNOSIS — G894 Chronic pain syndrome: Secondary | ICD-10-CM | POA: Diagnosis not present

## 2013-02-23 DIAGNOSIS — Z79899 Other long term (current) drug therapy: Secondary | ICD-10-CM | POA: Diagnosis not present

## 2013-02-23 DIAGNOSIS — M62838 Other muscle spasm: Secondary | ICD-10-CM | POA: Diagnosis not present

## 2013-02-23 DIAGNOSIS — IMO0001 Reserved for inherently not codable concepts without codable children: Secondary | ICD-10-CM | POA: Diagnosis not present

## 2013-02-24 DIAGNOSIS — G894 Chronic pain syndrome: Secondary | ICD-10-CM | POA: Diagnosis not present

## 2013-03-02 ENCOUNTER — Ambulatory Visit (INDEPENDENT_AMBULATORY_CARE_PROVIDER_SITE_OTHER): Payer: Medicare Other | Admitting: *Deleted

## 2013-03-02 DIAGNOSIS — Z7901 Long term (current) use of anticoagulants: Secondary | ICD-10-CM

## 2013-03-02 DIAGNOSIS — I4891 Unspecified atrial fibrillation: Secondary | ICD-10-CM

## 2013-03-02 DIAGNOSIS — I4821 Permanent atrial fibrillation: Secondary | ICD-10-CM

## 2013-03-09 DIAGNOSIS — Z23 Encounter for immunization: Secondary | ICD-10-CM | POA: Diagnosis not present

## 2013-03-30 ENCOUNTER — Ambulatory Visit (INDEPENDENT_AMBULATORY_CARE_PROVIDER_SITE_OTHER): Payer: Medicare Other | Admitting: Pharmacist

## 2013-03-30 DIAGNOSIS — Z7901 Long term (current) use of anticoagulants: Secondary | ICD-10-CM

## 2013-03-30 DIAGNOSIS — I4891 Unspecified atrial fibrillation: Secondary | ICD-10-CM

## 2013-03-30 DIAGNOSIS — I4821 Permanent atrial fibrillation: Secondary | ICD-10-CM

## 2013-03-30 LAB — POCT INR: INR: 2.2

## 2013-04-05 DIAGNOSIS — G894 Chronic pain syndrome: Secondary | ICD-10-CM | POA: Diagnosis not present

## 2013-04-05 DIAGNOSIS — IMO0001 Reserved for inherently not codable concepts without codable children: Secondary | ICD-10-CM | POA: Diagnosis not present

## 2013-04-05 DIAGNOSIS — M25519 Pain in unspecified shoulder: Secondary | ICD-10-CM | POA: Diagnosis not present

## 2013-04-05 DIAGNOSIS — Z79899 Other long term (current) drug therapy: Secondary | ICD-10-CM | POA: Diagnosis not present

## 2013-04-20 ENCOUNTER — Ambulatory Visit (INDEPENDENT_AMBULATORY_CARE_PROVIDER_SITE_OTHER): Payer: Medicare Other | Admitting: *Deleted

## 2013-04-20 DIAGNOSIS — I4821 Permanent atrial fibrillation: Secondary | ICD-10-CM

## 2013-04-20 DIAGNOSIS — I4891 Unspecified atrial fibrillation: Secondary | ICD-10-CM

## 2013-04-20 DIAGNOSIS — Z7901 Long term (current) use of anticoagulants: Secondary | ICD-10-CM | POA: Diagnosis not present

## 2013-04-20 LAB — POCT INR: INR: 1.9

## 2013-05-06 DIAGNOSIS — M159 Polyosteoarthritis, unspecified: Secondary | ICD-10-CM | POA: Diagnosis not present

## 2013-05-06 DIAGNOSIS — IMO0001 Reserved for inherently not codable concepts without codable children: Secondary | ICD-10-CM | POA: Diagnosis not present

## 2013-05-06 DIAGNOSIS — Z79899 Other long term (current) drug therapy: Secondary | ICD-10-CM | POA: Diagnosis not present

## 2013-05-06 DIAGNOSIS — G894 Chronic pain syndrome: Secondary | ICD-10-CM | POA: Diagnosis not present

## 2013-05-18 ENCOUNTER — Ambulatory Visit (INDEPENDENT_AMBULATORY_CARE_PROVIDER_SITE_OTHER): Payer: Medicare Other | Admitting: *Deleted

## 2013-05-18 DIAGNOSIS — I4891 Unspecified atrial fibrillation: Secondary | ICD-10-CM | POA: Diagnosis not present

## 2013-05-18 DIAGNOSIS — Z5181 Encounter for therapeutic drug level monitoring: Secondary | ICD-10-CM | POA: Diagnosis not present

## 2013-05-18 DIAGNOSIS — Z7901 Long term (current) use of anticoagulants: Secondary | ICD-10-CM | POA: Diagnosis not present

## 2013-05-18 DIAGNOSIS — I4821 Permanent atrial fibrillation: Secondary | ICD-10-CM

## 2013-05-18 LAB — POCT INR: INR: 2.3

## 2013-06-02 DIAGNOSIS — Z79899 Other long term (current) drug therapy: Secondary | ICD-10-CM | POA: Diagnosis not present

## 2013-06-02 DIAGNOSIS — IMO0001 Reserved for inherently not codable concepts without codable children: Secondary | ICD-10-CM | POA: Diagnosis not present

## 2013-06-02 DIAGNOSIS — G894 Chronic pain syndrome: Secondary | ICD-10-CM | POA: Diagnosis not present

## 2013-06-02 DIAGNOSIS — M545 Low back pain, unspecified: Secondary | ICD-10-CM | POA: Diagnosis not present

## 2013-06-03 DIAGNOSIS — G894 Chronic pain syndrome: Secondary | ICD-10-CM | POA: Diagnosis not present

## 2013-06-03 DIAGNOSIS — M159 Polyosteoarthritis, unspecified: Secondary | ICD-10-CM | POA: Diagnosis not present

## 2013-06-03 DIAGNOSIS — IMO0001 Reserved for inherently not codable concepts without codable children: Secondary | ICD-10-CM | POA: Diagnosis not present

## 2013-06-03 DIAGNOSIS — Z79899 Other long term (current) drug therapy: Secondary | ICD-10-CM | POA: Diagnosis not present

## 2013-06-07 DIAGNOSIS — E11329 Type 2 diabetes mellitus with mild nonproliferative diabetic retinopathy without macular edema: Secondary | ICD-10-CM | POA: Diagnosis not present

## 2013-06-07 DIAGNOSIS — H35349 Macular cyst, hole, or pseudohole, unspecified eye: Secondary | ICD-10-CM | POA: Diagnosis not present

## 2013-06-07 DIAGNOSIS — E1139 Type 2 diabetes mellitus with other diabetic ophthalmic complication: Secondary | ICD-10-CM | POA: Diagnosis not present

## 2013-06-07 DIAGNOSIS — H524 Presbyopia: Secondary | ICD-10-CM | POA: Diagnosis not present

## 2013-06-08 DIAGNOSIS — E785 Hyperlipidemia, unspecified: Secondary | ICD-10-CM | POA: Diagnosis not present

## 2013-06-08 DIAGNOSIS — I10 Essential (primary) hypertension: Secondary | ICD-10-CM | POA: Diagnosis not present

## 2013-06-08 DIAGNOSIS — E559 Vitamin D deficiency, unspecified: Secondary | ICD-10-CM | POA: Diagnosis not present

## 2013-06-08 DIAGNOSIS — E1165 Type 2 diabetes mellitus with hyperglycemia: Secondary | ICD-10-CM | POA: Diagnosis not present

## 2013-06-08 DIAGNOSIS — E1129 Type 2 diabetes mellitus with other diabetic kidney complication: Secondary | ICD-10-CM | POA: Diagnosis not present

## 2013-06-08 DIAGNOSIS — E669 Obesity, unspecified: Secondary | ICD-10-CM | POA: Diagnosis not present

## 2013-06-14 DIAGNOSIS — I10 Essential (primary) hypertension: Secondary | ICD-10-CM | POA: Diagnosis not present

## 2013-06-14 DIAGNOSIS — IMO0001 Reserved for inherently not codable concepts without codable children: Secondary | ICD-10-CM | POA: Diagnosis not present

## 2013-06-14 DIAGNOSIS — E559 Vitamin D deficiency, unspecified: Secondary | ICD-10-CM | POA: Diagnosis not present

## 2013-06-14 DIAGNOSIS — E669 Obesity, unspecified: Secondary | ICD-10-CM | POA: Diagnosis not present

## 2013-06-14 DIAGNOSIS — E785 Hyperlipidemia, unspecified: Secondary | ICD-10-CM | POA: Diagnosis not present

## 2013-06-15 ENCOUNTER — Ambulatory Visit (INDEPENDENT_AMBULATORY_CARE_PROVIDER_SITE_OTHER): Payer: Medicare Other | Admitting: *Deleted

## 2013-06-15 DIAGNOSIS — I4891 Unspecified atrial fibrillation: Secondary | ICD-10-CM | POA: Diagnosis not present

## 2013-06-15 DIAGNOSIS — Z7901 Long term (current) use of anticoagulants: Secondary | ICD-10-CM

## 2013-06-15 DIAGNOSIS — Z5181 Encounter for therapeutic drug level monitoring: Secondary | ICD-10-CM

## 2013-06-15 DIAGNOSIS — I4821 Permanent atrial fibrillation: Secondary | ICD-10-CM

## 2013-06-15 LAB — POCT INR: INR: 2.6

## 2013-06-30 DIAGNOSIS — Z79899 Other long term (current) drug therapy: Secondary | ICD-10-CM | POA: Diagnosis not present

## 2013-06-30 DIAGNOSIS — M545 Low back pain, unspecified: Secondary | ICD-10-CM | POA: Diagnosis not present

## 2013-06-30 DIAGNOSIS — G894 Chronic pain syndrome: Secondary | ICD-10-CM | POA: Diagnosis not present

## 2013-06-30 DIAGNOSIS — IMO0001 Reserved for inherently not codable concepts without codable children: Secondary | ICD-10-CM | POA: Diagnosis not present

## 2013-07-13 ENCOUNTER — Ambulatory Visit (INDEPENDENT_AMBULATORY_CARE_PROVIDER_SITE_OTHER): Payer: Medicare Other | Admitting: *Deleted

## 2013-07-13 DIAGNOSIS — I4821 Permanent atrial fibrillation: Secondary | ICD-10-CM

## 2013-07-13 DIAGNOSIS — Z5181 Encounter for therapeutic drug level monitoring: Secondary | ICD-10-CM

## 2013-07-13 DIAGNOSIS — I4891 Unspecified atrial fibrillation: Secondary | ICD-10-CM

## 2013-07-13 DIAGNOSIS — Z7901 Long term (current) use of anticoagulants: Secondary | ICD-10-CM

## 2013-07-13 LAB — POCT INR: INR: 1.6

## 2013-07-27 ENCOUNTER — Ambulatory Visit (INDEPENDENT_AMBULATORY_CARE_PROVIDER_SITE_OTHER): Payer: Medicare Other | Admitting: *Deleted

## 2013-07-27 DIAGNOSIS — I4821 Permanent atrial fibrillation: Secondary | ICD-10-CM

## 2013-07-27 DIAGNOSIS — Z5181 Encounter for therapeutic drug level monitoring: Secondary | ICD-10-CM

## 2013-07-27 DIAGNOSIS — Z7901 Long term (current) use of anticoagulants: Secondary | ICD-10-CM | POA: Diagnosis not present

## 2013-07-27 DIAGNOSIS — I4891 Unspecified atrial fibrillation: Secondary | ICD-10-CM | POA: Diagnosis not present

## 2013-07-27 LAB — POCT INR: INR: 1.8

## 2013-08-04 DIAGNOSIS — G894 Chronic pain syndrome: Secondary | ICD-10-CM | POA: Diagnosis not present

## 2013-08-04 DIAGNOSIS — M545 Low back pain, unspecified: Secondary | ICD-10-CM | POA: Diagnosis not present

## 2013-08-04 DIAGNOSIS — Z79899 Other long term (current) drug therapy: Secondary | ICD-10-CM | POA: Diagnosis not present

## 2013-08-04 DIAGNOSIS — IMO0001 Reserved for inherently not codable concepts without codable children: Secondary | ICD-10-CM | POA: Diagnosis not present

## 2013-08-05 DIAGNOSIS — IMO0001 Reserved for inherently not codable concepts without codable children: Secondary | ICD-10-CM | POA: Diagnosis not present

## 2013-08-05 DIAGNOSIS — G894 Chronic pain syndrome: Secondary | ICD-10-CM | POA: Diagnosis not present

## 2013-08-05 DIAGNOSIS — M545 Low back pain, unspecified: Secondary | ICD-10-CM | POA: Diagnosis not present

## 2013-08-05 DIAGNOSIS — Z79899 Other long term (current) drug therapy: Secondary | ICD-10-CM | POA: Diagnosis not present

## 2013-08-17 ENCOUNTER — Ambulatory Visit (INDEPENDENT_AMBULATORY_CARE_PROVIDER_SITE_OTHER): Payer: Medicare Other | Admitting: *Deleted

## 2013-08-17 DIAGNOSIS — I4891 Unspecified atrial fibrillation: Secondary | ICD-10-CM | POA: Diagnosis not present

## 2013-08-17 DIAGNOSIS — Z5181 Encounter for therapeutic drug level monitoring: Secondary | ICD-10-CM | POA: Diagnosis not present

## 2013-08-17 DIAGNOSIS — Z7901 Long term (current) use of anticoagulants: Secondary | ICD-10-CM

## 2013-08-17 DIAGNOSIS — I4821 Permanent atrial fibrillation: Secondary | ICD-10-CM

## 2013-08-17 LAB — POCT INR: INR: 2.4

## 2013-08-26 ENCOUNTER — Encounter: Payer: Self-pay | Admitting: Cardiology

## 2013-08-26 ENCOUNTER — Ambulatory Visit (INDEPENDENT_AMBULATORY_CARE_PROVIDER_SITE_OTHER): Payer: Medicare Other | Admitting: Cardiology

## 2013-08-26 VITALS — BP 114/74 | HR 80 | Ht 63.0 in | Wt 213.0 lb

## 2013-08-26 DIAGNOSIS — I10 Essential (primary) hypertension: Secondary | ICD-10-CM

## 2013-08-26 DIAGNOSIS — E785 Hyperlipidemia, unspecified: Secondary | ICD-10-CM

## 2013-08-26 DIAGNOSIS — I4891 Unspecified atrial fibrillation: Secondary | ICD-10-CM | POA: Diagnosis not present

## 2013-08-26 DIAGNOSIS — I4821 Permanent atrial fibrillation: Secondary | ICD-10-CM

## 2013-08-26 NOTE — Progress Notes (Signed)
Clinical Summary [name] is a 79 y.o.female last seen in November 2014. She reports occasional palpitations, no chest pain. She continues on beta blocker and Coumadin.  Recent INR 2.4 on May 12th. No bleeding problems.  She tells me that she suffers with stomach pain, plans to see Dr. [name] for followup. Also has trouble with chronic anxiety.  She does enjoy reading, no regular exercise however. Remains functional with her ADLs.  Allergies  Allergen Reactions  . Adhesive [Tape] Itching    Pulls skin off.   . Iohexol Nausea And Vomiting  . Meperidine Hcl Nausea And Vomiting  . Shellfish Allergy Diarrhea and Nausea And Vomiting  . Penicillins Nausea And Vomiting and Rash    Current Outpatient Prescriptions  Medication Sig Dispense Refill  . acetaminophen (TYLENOL) 325 MG tablet Take 650 mg by mouth every 6 (six) hours as needed for pain.       [name] albuterol (PROAIR HFA) 108 (90 BASE) MCG/ACT inhaler Inhale 2 puffs into the lungs every 4 (four) hours as needed. Shortness of Breath      . ALPRAZolam (XANAX) 1 MG tablet Take 1 mg by mouth 3 (three) times daily as needed for anxiety.       [name] amLODipine (NORVASC) 10 MG tablet Take 10 mg by mouth daily.      [name] BYETTA 10 MCG PEN 10 MCG/0.04ML SOLN Inject 10 mcg into the skin Twice daily.      . cloNIDine (CATAPRES - DOSED IN MG/24 HR) 0.1 mg/24hr patch APPLY 1 PATCH ONCE A WEEK AS DIRECTED  4 patch  6  . esomeprazole (NEXIUM) 40 MG capsule Take 40 mg by mouth 2 (two) times daily.        . hydrochlorothiazide 50 MG tablet Take 50 mg by mouth daily.       . insulin glargine (LANTUS) 100 UNIT/ML injection Inject 40 Units into the skin at bedtime.       . insulin lispro (HUMALOG) 100 UNIT/ML injection Inject 8 Units into the skin 3 (three) times daily.       [name] lidocaine (LIDODERM) 5 % Place 1 patch onto the skin daily as needed (Pain). Remove & Discard patch within 12 hours or as directed by MD      . metoprolol (LOPRESSOR) 50 MG tablet  Take 50 mg by mouth 2 (two) times daily.       . nitroGLYCERIN (NITROSTAT) 0.4 MG SL tablet Place 0.4 mg under the tongue every 5 (five) minutes as needed. Chest Pains      . ondansetron (ZOFRAN-ODT) 4 MG disintegrating tablet Take 4 mg by mouth every 8 (eight) hours as needed. Nausea and Vomiting      . oxyCODONE (ROXICODONE) 15 MG immediate release tablet Take 15 mg by mouth every 6 (six) hours as needed. Pain      . simvastatin (ZOCOR) 20 MG tablet Take 1 tablet by mouth daily.      . tizanidine (ZANAFLEX) 2 MG capsule Take 2 mg by mouth 3 (three) times daily as needed for muscle spasms.       [name] warfarin (COUMADIN) 5 MG tablet Take 2.5-5 mg by mouth daily. Takes 2.5 mg on Mon and Thurs, Takes 5 mg on all other days. MANAGED BY LISA       No current facility-administered medications for this visit.    Past Medical History  Diagnosis Date  . Diarrhea   . Esophageal reflux   . OSA (obstructive sleep apnea)   .  Fibromyalgia   . Nephrolithiasis   . Anxiety   . Mixed hyperlipidemia   . Essential hypertension, benign   . Colonoscopy causing post-procedural bleeding     Incomplete. by Dr. [name] 06/11/99 due to fixed non-compliant colon precluded exam to 40 cm, she had subsequent colectomy for diverticulitis since that time.   . Chronic back pain   . Hiatal hernia     Recent EGD/TCS showed small hiatal hernia, normal apperaring tubular esophagus. s/p passage of a 54-french Maloney dilator, s/p biopsy esophageal mucosa, multiple fundal gland type gastric polyps. one large pedunculated polp with oozing, noted s/p clipping and snare polypectomy. Biopsies of the gastric mucosa taken given her symptoms in her elevated count. normal D1-D3, s/p biospy D2-D3.   [name] Hiatal hernia     all bx unremarkable. on TCS she had left-sided diverticula, evidence of prior segmental resection with anastomosis, multiple colonic polyps s/p multiple snare polypectomies, s/p segmental biopsy and stool sampling. she had  multiple tubular adenomas. no microscopic/collagenous colitis. stool culture, c diff, O+P, lactoferrin were negative.   . Ureteral stent retained     Not retained. ureteral stent removal gross hematuria resolved 10/11. coumadin restarted.   . Type 2 diabetes mellitus   . Permanent atrial fibrillation     Previous failed DCCV  . Asthma   . Ventral hernia     Social History Ms. Sprenkle reports that she has never smoked. She has never used smokeless tobacco. Ms. Robinson reports that she does not drink alcohol.  Review of Systems Negative except as outlined.  Physical Examination Filed Vitals:   08/26/13 1449  BP: 114/74  Pulse: 80   Filed Weights   08/26/13 1449  Weight: 213 lb (96.616 kg)    Overweight woman in no acute distress.  HEENT: Conjunctiva and lids normal, oropharynx clear.  Neck: Supple, no elevated JVP or carotid bruits, no thyromegaly.  Lungs: Clear to auscultation, nonlabored breathing at rest.  Cardiac: Irregularly irregular, no S3 or significant systolic murmur.  Abdomen: Soft, nontender,bowel sounds present.  Extremities: Trace edema, distal pulses 2+.    Problem List and Plan   Permanent atrial fibrillation Continue strategy of heart rate control and anticoagulation, no change in current regimen.  Essential hypertension, benign Blood pressure is well-controlled today.  HYPERLIPIDEMIA-MIXED Continues on Zocor, keep followup with Dr. [name].    [name], M.D., F.A.C.C.

## 2013-08-26 NOTE — Assessment & Plan Note (Signed)
Blood pressure is well-controlled today. 

## 2013-08-26 NOTE — Patient Instructions (Signed)

## 2013-08-26 NOTE — Assessment & Plan Note (Signed)
Continue strategy of heart rate control and anticoagulation, no change in current regimen.

## 2013-08-26 NOTE — Assessment & Plan Note (Signed)
Continues on Zocor, keep followup with Dr. [name].

## 2013-08-31 ENCOUNTER — Other Ambulatory Visit: Payer: Self-pay | Admitting: Cardiology

## 2013-09-03 DIAGNOSIS — M545 Low back pain, unspecified: Secondary | ICD-10-CM | POA: Diagnosis not present

## 2013-09-03 DIAGNOSIS — IMO0001 Reserved for inherently not codable concepts without codable children: Secondary | ICD-10-CM | POA: Diagnosis not present

## 2013-09-03 DIAGNOSIS — G894 Chronic pain syndrome: Secondary | ICD-10-CM | POA: Diagnosis not present

## 2013-09-03 DIAGNOSIS — Z79899 Other long term (current) drug therapy: Secondary | ICD-10-CM | POA: Diagnosis not present

## 2013-09-08 DIAGNOSIS — I10 Essential (primary) hypertension: Secondary | ICD-10-CM | POA: Diagnosis not present

## 2013-09-08 DIAGNOSIS — IMO0001 Reserved for inherently not codable concepts without codable children: Secondary | ICD-10-CM | POA: Diagnosis not present

## 2013-09-08 DIAGNOSIS — E785 Hyperlipidemia, unspecified: Secondary | ICD-10-CM | POA: Diagnosis not present

## 2013-09-14 DIAGNOSIS — IMO0001 Reserved for inherently not codable concepts without codable children: Secondary | ICD-10-CM | POA: Diagnosis not present

## 2013-09-14 DIAGNOSIS — I10 Essential (primary) hypertension: Secondary | ICD-10-CM | POA: Diagnosis not present

## 2013-09-14 DIAGNOSIS — E785 Hyperlipidemia, unspecified: Secondary | ICD-10-CM | POA: Diagnosis not present

## 2013-09-14 DIAGNOSIS — E669 Obesity, unspecified: Secondary | ICD-10-CM | POA: Diagnosis not present

## 2013-09-17 ENCOUNTER — Ambulatory Visit (INDEPENDENT_AMBULATORY_CARE_PROVIDER_SITE_OTHER): Payer: Medicare Other | Admitting: *Deleted

## 2013-09-17 DIAGNOSIS — Z5181 Encounter for therapeutic drug level monitoring: Secondary | ICD-10-CM | POA: Diagnosis not present

## 2013-09-17 DIAGNOSIS — I4891 Unspecified atrial fibrillation: Secondary | ICD-10-CM | POA: Diagnosis not present

## 2013-09-17 DIAGNOSIS — Z7901 Long term (current) use of anticoagulants: Secondary | ICD-10-CM | POA: Diagnosis not present

## 2013-09-17 DIAGNOSIS — I4821 Permanent atrial fibrillation: Secondary | ICD-10-CM

## 2013-09-17 LAB — POCT INR: INR: 1.6

## 2013-09-29 DIAGNOSIS — R300 Dysuria: Secondary | ICD-10-CM | POA: Diagnosis not present

## 2013-09-29 DIAGNOSIS — R109 Unspecified abdominal pain: Secondary | ICD-10-CM | POA: Diagnosis not present

## 2013-09-30 ENCOUNTER — Other Ambulatory Visit: Payer: Self-pay | Admitting: Cardiology

## 2013-10-04 DIAGNOSIS — M549 Dorsalgia, unspecified: Secondary | ICD-10-CM | POA: Diagnosis not present

## 2013-10-04 DIAGNOSIS — Z79899 Other long term (current) drug therapy: Secondary | ICD-10-CM | POA: Diagnosis not present

## 2013-10-04 DIAGNOSIS — IMO0001 Reserved for inherently not codable concepts without codable children: Secondary | ICD-10-CM | POA: Diagnosis not present

## 2013-10-04 DIAGNOSIS — G894 Chronic pain syndrome: Secondary | ICD-10-CM | POA: Diagnosis not present

## 2013-10-05 ENCOUNTER — Other Ambulatory Visit (HOSPITAL_COMMUNITY): Payer: Self-pay | Admitting: Internal Medicine

## 2013-10-05 ENCOUNTER — Ambulatory Visit (INDEPENDENT_AMBULATORY_CARE_PROVIDER_SITE_OTHER): Payer: Medicare Other | Admitting: *Deleted

## 2013-10-05 DIAGNOSIS — Z5181 Encounter for therapeutic drug level monitoring: Secondary | ICD-10-CM | POA: Diagnosis not present

## 2013-10-05 DIAGNOSIS — Z7901 Long term (current) use of anticoagulants: Secondary | ICD-10-CM

## 2013-10-05 DIAGNOSIS — I4891 Unspecified atrial fibrillation: Secondary | ICD-10-CM

## 2013-10-05 DIAGNOSIS — N200 Calculus of kidney: Secondary | ICD-10-CM

## 2013-10-05 DIAGNOSIS — I4821 Permanent atrial fibrillation: Secondary | ICD-10-CM

## 2013-10-05 LAB — POCT INR: INR: 2

## 2013-10-07 ENCOUNTER — Ambulatory Visit (HOSPITAL_COMMUNITY)
Admission: RE | Admit: 2013-10-07 | Discharge: 2013-10-07 | Disposition: A | Discharge status: Home / Self Care | Payer: Medicare Other | Source: Ambulatory Visit | Attending: Internal Medicine | Admitting: Internal Medicine

## 2013-10-07 DIAGNOSIS — N135 Crossing vessel and stricture of ureter without hydronephrosis: Secondary | ICD-10-CM | POA: Diagnosis not present

## 2013-10-07 DIAGNOSIS — N1330 Unspecified hydronephrosis: Secondary | ICD-10-CM | POA: Insufficient documentation

## 2013-10-07 DIAGNOSIS — Z9049 Acquired absence of other specified parts of digestive tract: Secondary | ICD-10-CM | POA: Insufficient documentation

## 2013-10-07 DIAGNOSIS — R3129 Other microscopic hematuria: Secondary | ICD-10-CM | POA: Insufficient documentation

## 2013-10-07 DIAGNOSIS — R109 Unspecified abdominal pain: Secondary | ICD-10-CM | POA: Diagnosis not present

## 2013-10-07 DIAGNOSIS — N200 Calculus of kidney: Secondary | ICD-10-CM

## 2013-10-28 DIAGNOSIS — Z79899 Other long term (current) drug therapy: Secondary | ICD-10-CM | POA: Diagnosis not present

## 2013-10-28 DIAGNOSIS — M549 Dorsalgia, unspecified: Secondary | ICD-10-CM | POA: Diagnosis not present

## 2013-10-28 DIAGNOSIS — M545 Low back pain, unspecified: Secondary | ICD-10-CM | POA: Diagnosis not present

## 2013-10-28 DIAGNOSIS — G894 Chronic pain syndrome: Secondary | ICD-10-CM | POA: Diagnosis not present

## 2013-10-28 DIAGNOSIS — IMO0001 Reserved for inherently not codable concepts without codable children: Secondary | ICD-10-CM | POA: Diagnosis not present

## 2013-11-02 ENCOUNTER — Ambulatory Visit (INDEPENDENT_AMBULATORY_CARE_PROVIDER_SITE_OTHER): Payer: Medicare Other | Admitting: *Deleted

## 2013-11-02 DIAGNOSIS — Z5181 Encounter for therapeutic drug level monitoring: Secondary | ICD-10-CM

## 2013-11-02 DIAGNOSIS — I4821 Permanent atrial fibrillation: Secondary | ICD-10-CM

## 2013-11-02 DIAGNOSIS — Z7901 Long term (current) use of anticoagulants: Secondary | ICD-10-CM

## 2013-11-02 DIAGNOSIS — I4891 Unspecified atrial fibrillation: Secondary | ICD-10-CM

## 2013-11-02 LAB — POCT INR: INR: 2.4

## 2013-11-30 ENCOUNTER — Ambulatory Visit (INDEPENDENT_AMBULATORY_CARE_PROVIDER_SITE_OTHER): Payer: Medicare Other | Admitting: *Deleted

## 2013-11-30 DIAGNOSIS — Z7901 Long term (current) use of anticoagulants: Secondary | ICD-10-CM

## 2013-11-30 DIAGNOSIS — Z5181 Encounter for therapeutic drug level monitoring: Secondary | ICD-10-CM | POA: Diagnosis not present

## 2013-11-30 DIAGNOSIS — I4891 Unspecified atrial fibrillation: Secondary | ICD-10-CM

## 2013-11-30 DIAGNOSIS — I4821 Permanent atrial fibrillation: Secondary | ICD-10-CM

## 2013-11-30 LAB — POCT INR: INR: 2.7

## 2013-12-08 DIAGNOSIS — IMO0001 Reserved for inherently not codable concepts without codable children: Secondary | ICD-10-CM | POA: Diagnosis not present

## 2013-12-08 DIAGNOSIS — E785 Hyperlipidemia, unspecified: Secondary | ICD-10-CM | POA: Diagnosis not present

## 2013-12-08 DIAGNOSIS — I10 Essential (primary) hypertension: Secondary | ICD-10-CM | POA: Diagnosis not present

## 2013-12-15 DIAGNOSIS — IMO0001 Reserved for inherently not codable concepts without codable children: Secondary | ICD-10-CM | POA: Diagnosis not present

## 2013-12-15 DIAGNOSIS — E785 Hyperlipidemia, unspecified: Secondary | ICD-10-CM | POA: Diagnosis not present

## 2013-12-15 DIAGNOSIS — I10 Essential (primary) hypertension: Secondary | ICD-10-CM | POA: Diagnosis not present

## 2013-12-27 DIAGNOSIS — Z79899 Other long term (current) drug therapy: Secondary | ICD-10-CM | POA: Diagnosis not present

## 2013-12-27 DIAGNOSIS — M545 Low back pain, unspecified: Secondary | ICD-10-CM | POA: Diagnosis not present

## 2013-12-27 DIAGNOSIS — G8929 Other chronic pain: Secondary | ICD-10-CM | POA: Diagnosis not present

## 2013-12-27 DIAGNOSIS — IMO0001 Reserved for inherently not codable concepts without codable children: Secondary | ICD-10-CM | POA: Diagnosis not present

## 2013-12-28 ENCOUNTER — Ambulatory Visit (INDEPENDENT_AMBULATORY_CARE_PROVIDER_SITE_OTHER): Payer: Medicare Other | Admitting: *Deleted

## 2013-12-28 DIAGNOSIS — I4891 Unspecified atrial fibrillation: Secondary | ICD-10-CM

## 2013-12-28 DIAGNOSIS — Z7901 Long term (current) use of anticoagulants: Secondary | ICD-10-CM | POA: Diagnosis not present

## 2013-12-28 DIAGNOSIS — Z5181 Encounter for therapeutic drug level monitoring: Secondary | ICD-10-CM

## 2013-12-28 DIAGNOSIS — I4821 Permanent atrial fibrillation: Secondary | ICD-10-CM

## 2013-12-28 LAB — POCT INR: INR: 4.2

## 2014-01-18 ENCOUNTER — Ambulatory Visit (INDEPENDENT_AMBULATORY_CARE_PROVIDER_SITE_OTHER): Payer: Medicare Other | Admitting: *Deleted

## 2014-01-18 DIAGNOSIS — Z5181 Encounter for therapeutic drug level monitoring: Secondary | ICD-10-CM | POA: Diagnosis not present

## 2014-01-18 DIAGNOSIS — I4891 Unspecified atrial fibrillation: Secondary | ICD-10-CM

## 2014-01-18 DIAGNOSIS — Z7901 Long term (current) use of anticoagulants: Secondary | ICD-10-CM | POA: Diagnosis not present

## 2014-01-18 DIAGNOSIS — I4821 Permanent atrial fibrillation: Secondary | ICD-10-CM

## 2014-01-18 DIAGNOSIS — I482 Chronic atrial fibrillation: Secondary | ICD-10-CM

## 2014-01-18 LAB — POCT INR: INR: 2.4

## 2014-02-08 ENCOUNTER — Encounter: Payer: Self-pay | Admitting: Cardiology

## 2014-02-08 ENCOUNTER — Ambulatory Visit (INDEPENDENT_AMBULATORY_CARE_PROVIDER_SITE_OTHER): Payer: Medicare Other | Admitting: Cardiology

## 2014-02-08 ENCOUNTER — Ambulatory Visit (INDEPENDENT_AMBULATORY_CARE_PROVIDER_SITE_OTHER): Payer: Medicare Other | Admitting: *Deleted

## 2014-02-08 VITALS — BP 143/84 | HR 83 | Ht 63.0 in | Wt 212.8 lb

## 2014-02-08 DIAGNOSIS — Z5181 Encounter for therapeutic drug level monitoring: Secondary | ICD-10-CM

## 2014-02-08 DIAGNOSIS — I10 Essential (primary) hypertension: Secondary | ICD-10-CM

## 2014-02-08 DIAGNOSIS — I4821 Permanent atrial fibrillation: Secondary | ICD-10-CM

## 2014-02-08 DIAGNOSIS — I482 Chronic atrial fibrillation: Secondary | ICD-10-CM

## 2014-02-08 DIAGNOSIS — I4891 Unspecified atrial fibrillation: Secondary | ICD-10-CM | POA: Diagnosis not present

## 2014-02-08 DIAGNOSIS — Z7901 Long term (current) use of anticoagulants: Secondary | ICD-10-CM | POA: Diagnosis not present

## 2014-02-08 LAB — POCT INR: INR: 3.1

## 2014-02-08 NOTE — Assessment & Plan Note (Signed)
Symptomatically stable. Continue strategy of heart rate control and anticoagulation.

## 2014-02-08 NOTE — Progress Notes (Signed)
Reason for visit: Atrial fibrillation, hypertension  Clinical Summary [name] is a 79 y.o.female last seen in May. She presents for a routine visit. She denies any significant sense of palpitations or chest pain. ECG today shows rate-controlled atrial fibrillation with low voltage. She has had no change in her Lopressor, Coumadin was checked today, INR 3.1. She denies any bleeding episodes.  Blood pressure mildly increased. She reports compliance with both Lopressor and clonidine.no orthopnea or PND.   Allergies  Allergen Reactions  . Adhesive [Tape] Itching    Pulls skin off.   . Iohexol Nausea And Vomiting  . Meperidine Hcl Nausea And Vomiting  . Shellfish Allergy Diarrhea and Nausea And Vomiting  . Penicillins Nausea And Vomiting and Rash    Current Outpatient Prescriptions  Medication Sig Dispense Refill  . acetaminophen (TYLENOL) 325 MG tablet Take 650 mg by mouth every 6 (six) hours as needed for pain.     [name] albuterol (PROAIR HFA) 108 (90 BASE) MCG/ACT inhaler Inhale 2 puffs into the lungs every 4 (four) hours as needed. Shortness of Breath    . ALPRAZolam (XANAX) 1 MG tablet Take 1 mg by mouth 3 (three) times daily as needed for anxiety.     [name] BYETTA 10 MCG PEN 10 MCG/0.04ML SOLN Inject 10 mcg into the skin Twice daily.    . cloNIDine (CATAPRES - DOSED IN MG/24 HR) 0.1 mg/24hr patch APPLY 1 PATCH ONCE A WEEK AS DIRECTED 4 patch 6  . esomeprazole (NEXIUM) 40 MG capsule Take 40 mg by mouth 2 (two) times daily.      . hydrochlorothiazide 50 MG tablet Take 50 mg by mouth daily.     . insulin glargine (LANTUS) 100 UNIT/ML injection Inject 36 Units into the skin at bedtime.     . insulin lispro (HUMALOG) 100 UNIT/ML injection Inject 6 Units into the skin 3 (three) times daily.     [name] lidocaine (LIDODERM) 5 % Place 1 patch onto the skin daily as needed (Pain). Remove & Discard patch within 12 hours or as directed by MD    . metoprolol (LOPRESSOR) 50 MG tablet Take 50 mg by mouth  2 (two) times daily.     . ondansetron (ZOFRAN-ODT) 4 MG disintegrating tablet Take 4 mg by mouth every 8 (eight) hours as needed. Nausea and Vomiting    . oxyCODONE (ROXICODONE) 15 MG immediate release tablet Take 15 mg by mouth every 6 (six) hours as needed. Pain    . simvastatin (ZOCOR) 20 MG tablet Take 1 tablet by mouth daily.    . tizanidine (ZANAFLEX) 2 MG capsule Take 2 mg by mouth 3 (three) times daily as needed for muscle spasms.     [name] warfarin (COUMADIN) 5 MG tablet TAKE 1 TABLET BY MOUTH AS DIRECTED 30 tablet 5   No current facility-administered medications for this visit.    Past Medical History  Diagnosis Date  . Diarrhea   . Esophageal reflux   . OSA (obstructive sleep apnea)   . Fibromyalgia   . Nephrolithiasis   . Anxiety   . Mixed hyperlipidemia   . Essential hypertension, benign   . Colonoscopy causing post-procedural bleeding     Incomplete. by Dr. [name] 06/11/99 due to fixed non-compliant colon precluded exam to 40 cm, she had subsequent colectomy for diverticulitis since that time.   . Chronic back pain   . Hiatal hernia     Recent EGD/TCS showed small hiatal hernia, normal apperaring tubular esophagus. s/p passage  of a 54-french Maloney dilator, s/p biopsy esophageal mucosa, multiple fundal gland type gastric polyps. one large pedunculated polp with oozing, noted s/p clipping and snare polypectomy. Biopsies of the gastric mucosa taken given her symptoms in her elevated count. normal D1-D3, s/p biospy D2-D3.   [name] Hiatal hernia     all bx unremarkable. on TCS she had left-sided diverticula, evidence of prior segmental resection with anastomosis, multiple colonic polyps s/p multiple snare polypectomies, s/p segmental biopsy and stool sampling. she had multiple tubular adenomas. no microscopic/collagenous colitis. stool culture, c diff, O+P, lactoferrin were negative.   . Ureteral stent retained     Not retained. ureteral stent removal gross hematuria resolved 10/11.  coumadin restarted.   . Type 2 diabetes mellitus   . Permanent atrial fibrillation     Previous failed DCCV  . Asthma   . Ventral hernia     Social History [name] reports that she has never smoked. She has never used smokeless tobacco. [name] reports that she does not drink alcohol.  Review of Systems Complete review of systems negative except as otherwise outlined in the clinical summary and also the following.continues to have trouble with abdominal discomfort and cramping, feeling of nausea in the morning when she eats. She will be seeing Dr.[name] soon.  Physical Examination Filed Vitals:   02/08/14 1304  BP: 143/84  Pulse: 83   Filed Weights   02/08/14 1304  Weight: 212 lb 12.8 oz (96.525 kg)    Overweight woman in no acute distress.  HEENT: Conjunctiva and lids normal, oropharynx clear.  Neck: Supple, no elevated JVP or carotid bruits, no thyromegaly.  Lungs: Clear to auscultation, nonlabored breathing at rest.  Cardiac: Irregularly irregular, no S3 or significant systolic murmur.  Abdomen: Soft, nontender,bowel sounds present.  Extremities: Trace edema, distal pulses 2+.    Problem List and Plan   Permanent atrial fibrillation Symptomatically stable. Continue strategy of heart rate control and anticoagulation.  Essential hypertension, benign Blood pressure is only mildly increased today. No changes were made in regimen however. Keep follow-up with Dr. [name].    [name], M.D., F.A.C.C.

## 2014-02-08 NOTE — Assessment & Plan Note (Signed)
Blood pressure is only mildly increased today. No changes were made in regimen however. Keep follow-up with Dr. [name].

## 2014-02-08 NOTE — Patient Instructions (Signed)

## 2014-02-16 ENCOUNTER — Encounter: Payer: Self-pay | Admitting: *Deleted

## 2014-02-21 DIAGNOSIS — M545 Low back pain: Secondary | ICD-10-CM | POA: Diagnosis not present

## 2014-02-21 DIAGNOSIS — Z79899 Other long term (current) drug therapy: Secondary | ICD-10-CM | POA: Diagnosis not present

## 2014-02-21 DIAGNOSIS — M797 Fibromyalgia: Secondary | ICD-10-CM | POA: Diagnosis not present

## 2014-02-21 DIAGNOSIS — G8929 Other chronic pain: Secondary | ICD-10-CM | POA: Diagnosis not present

## 2014-02-22 ENCOUNTER — Encounter: Payer: Self-pay | Admitting: Gastroenterology

## 2014-02-22 ENCOUNTER — Ambulatory Visit (INDEPENDENT_AMBULATORY_CARE_PROVIDER_SITE_OTHER): Payer: Medicare Other | Admitting: Gastroenterology

## 2014-02-22 VITALS — BP 144/70 | HR 69 | Temp 96.2°F | Ht 63.0 in | Wt 213.0 lb

## 2014-02-22 DIAGNOSIS — R1013 Epigastric pain: Secondary | ICD-10-CM | POA: Diagnosis not present

## 2014-02-22 DIAGNOSIS — R197 Diarrhea, unspecified: Secondary | ICD-10-CM

## 2014-02-22 DIAGNOSIS — R1032 Left lower quadrant pain: Secondary | ICD-10-CM

## 2014-02-22 MED ORDER — DICYCLOMINE HCL 10 MG PO CAPS: 10.0000 mg | ORAL_CAPSULE | Freq: Three times a day (TID) | ORAL | Status: DC

## 2014-02-22 NOTE — Assessment & Plan Note (Signed)
Intermittent. Gallbladder in situ. Typical GERD well-controlled. Check labs. If symptoms worsen, consider gallbladder work-up.

## 2014-02-22 NOTE — Assessment & Plan Note (Addendum)
Chronic intermittent diarrhea, LLQ pain with h/o IBS. For 3 months, complains of persistent diarrhea, 6-10 times daily, nocturnal symptoms. Need to rule out other etiologies. Start with stool studies, labs. Trial of bentyl. Potential side effects discussed.

## 2014-02-22 NOTE — Progress Notes (Signed)
Primary Care Physician: Delphina Cahill, MD  Primary Gastroenterologist:  Garfield Cornea, MD   Chief Complaint  Patient presents with  . Diarrhea  . Abdominal Pain    HPI: [name] is a 79 y.o. female here for further evaluation of diarrhea and abdominal pain. Last seen in 2013. H/O IBS, GERD, chronic nausea, complicated diverticulitis requiring partial colectomy.  Last colonoscopy in September 2013. Suboptimal preparation. Multiple diminutive polyps 10 cm from the anal verge, anastomosis at 25 cm, multiple 3-5 mm polyps at the ICD, splenic flexure, descending segments. Pathology revealed tubular adenomas. Last EGD September 2013 revealed small hiatal hernia, hyperplastic appearing polyps. Maloney dilation performed for history of dysphagia.  Previously evaluated for microscopic colitis in 2010.  She had a CT abdomen pelvis without contrast July 2015 showed moderate to severe right hydronephrosis, significantly improved from prior CT in 2010. UPJ obstruction. Patient previously followed by Dr. Michela Pitcher and referred to urologist in [place]. Per patient she was advised to have her right kidney removed (3 years ago) but she refused. No concern for malignancy per patient.  Recently 3 months, persistent diarrhea. 6-10 episodes per day, some nocturnal diarrhea, fecal urgency. Rare solid stool. No recent antibiotics. LLQ pain and into back. No aggravated factors. Urine orange. H/O hematuria, followed by Dr. [name] more recently. Patient not interested in pursuing urology consult right now. No dysuria.  No melena, brbpr. Heartburn controlled on nexium. +regurgitation if bending over. Epigastric burning with bread or other food at times. Can happen couple of days. Goes away for days. Nausea every day. Takes zofran as needed.   Will have to change Nexium after 04/08/2014 due to insurance. She will let us know what PPI is covered.     Current Outpatient Prescriptions  Medication Sig Dispense Refill    . acetaminophen (TYLENOL) 325 MG tablet Take 650 mg by mouth every 6 (six) hours as needed for pain.     [name] albuterol (PROAIR HFA) 108 (90 BASE) MCG/ACT inhaler Inhale 2 puffs into the lungs every 4 (four) hours as needed. Shortness of Breath    . ALPRAZolam (XANAX) 1 MG tablet Take 1 mg by mouth 3 (three) times daily as needed for anxiety.     [name] BYETTA 10 MCG PEN 10 MCG/0.04ML SOLN Inject 10 mcg into the skin Twice daily.    . cloNIDine (CATAPRES - DOSED IN MG/24 HR) 0.1 mg/24hr patch APPLY 1 PATCH ONCE A WEEK AS DIRECTED 4 patch 6  . esomeprazole (NEXIUM) 40 MG capsule Take 40 mg by mouth 2 (two) times daily.      . hydrochlorothiazide 50 MG tablet Take 50 mg by mouth daily.     . insulin glargine (LANTUS) 100 UNIT/ML injection Inject 36 Units into the skin at bedtime.     . insulin lispro (HUMALOG) 100 UNIT/ML injection Inject 6 Units into the skin 3 (three) times daily.     [name] lidocaine (LIDODERM) 5 % Place 1 patch onto the skin daily as needed (Pain). Remove & Discard patch within 12 hours or as directed by MD    . metoprolol (LOPRESSOR) 50 MG tablet Take 50 mg by mouth 2 (two) times daily.     . ondansetron (ZOFRAN-ODT) 4 MG disintegrating tablet Take 4 mg by mouth every 8 (eight) hours as needed. Nausea and Vomiting    . oxyCODONE (ROXICODONE) 15 MG immediate release tablet Take 15 mg by mouth every 6 (six) hours as needed. Pain    .  simvastatin (ZOCOR) 20 MG tablet Take 1 tablet by mouth daily.    . tizanidine (ZANAFLEX) 2 MG capsule Take 2 mg by mouth 3 (three) times daily as needed for muscle spasms.     [name] warfarin (COUMADIN) 5 MG tablet TAKE 1 TABLET BY MOUTH AS DIRECTED 30 tablet 5   No current facility-administered medications for this visit.    Allergies as of 02/22/2014 - Review Complete 02/22/2014  Allergen Reaction Noted  . Adhesive [tape] Itching 11/29/2011  . Demerol [meperidine]  02/22/2014  . Iohexol Nausea And Vomiting 03/08/2004  . Meperidine hcl Nausea And Vomiting    . Shellfish allergy Diarrhea and Nausea And Vomiting 08/03/2012  . Penicillins Nausea And Vomiting and Rash    Past Medical History  Diagnosis Date  . Diarrhea   . Esophageal reflux   . OSA (obstructive sleep apnea)   . Fibromyalgia   . Nephrolithiasis   . Anxiety   . Mixed hyperlipidemia   . Essential hypertension, benign   . Colonoscopy causing post-procedural bleeding     Incomplete. by Dr. [name] 06/11/99 due to fixed non-compliant colon precluded exam to 40 cm, she had subsequent colectomy for diverticulitis since that time.   . Chronic back pain   . Hiatal hernia     Recent EGD/TCS showed small hiatal hernia, normal apperaring tubular esophagus. s/p passage of a 54-french Maloney dilator, s/p biopsy esophageal mucosa, multiple fundal gland type gastric polyps. one large pedunculated polp with oozing, noted s/p clipping and snare polypectomy. Biopsies of the gastric mucosa taken given her symptoms in her elevated count. normal D1-D3, s/p biospy D2-D3.   [name] Hiatal hernia     all bx unremarkable. on TCS she had left-sided diverticula, evidence of prior segmental resection with anastomosis, multiple colonic polyps s/p multiple snare polypectomies, s/p segmental biopsy and stool sampling. she had multiple tubular adenomas. no microscopic/collagenous colitis. stool culture, c diff, O+P, lactoferrin were negative.   . Ureteral stent retained     Not retained. ureteral stent removal gross hematuria resolved 10/11. coumadin restarted.   . Type 2 diabetes mellitus   . Permanent atrial fibrillation     Previous failed DCCV  . Asthma   . Ventral hernia    Past Surgical History  Procedure Laterality Date  . Total abdominal hysterectomy    . Tonsillectomy    . Colectomy      2005. Dr. Geoffry Paradise for diverticulitis  . Esophagogastroduodenoscopy  02/22/09    normal s/p dilator;small HH/one large pedunculates polyp  s/p clipping, hyperplastic  . Colonoscopy  02/22/09    normal/left-sided  diverticula/multiple colonic polyp, adenomatous  . Breast lumpectomy      benign cyst  . Colonoscopy  12/16/2011    colonic polyps-treated as described above. Status postprior sigmoid resection  . Cataract extraction w/phaco Left 08/17/2012    Procedure: CATARACT EXTRACTION PHACO AND INTRAOCULAR LENS PLACEMENT (IOC);  Surgeon: [name], MD;  Location: [facility];  Service: Ophthalmology;  Laterality: Left;  CDE:18.73   Family History  Problem Relation Age of Onset  . Coronary artery disease      Family Hx   . Colon cancer Maternal Grandfather    History   Social History  . Marital Status: Widowed    Spouse Name: N/A    Number of Children: N/A  . Years of Education: N/A   Social History Main Topics  . Smoking status: Never Smoker   . Smokeless tobacco: Never Used     Comment: tobacco use -  no  . Alcohol Use: No  . Drug Use: No  . Sexual Activity: None   Other Topics Concern  . None   Social History Narrative    ROS:  General: Negative for anorexia, weight loss, fever, chills, fatigue, weakness. ENT: Negative for hoarseness, difficulty swallowing , nasal congestion. CV: Negative for chest pain, angina, palpitations, dyspnea on exertion, peripheral edema.  Respiratory: Negative for dyspnea at rest, dyspnea on exertion, cough, sputum, wheezing.  GI: See history of present illness. GU:  See hpi Endo: Negative for unusual weight change.    Physical Examination:   BP 144/70 mmHg  Pulse 69  Temp(Src) 96.2 F (35.7 C) (Oral)  Ht 5\' 3"  (1.6 m)  Wt 213 lb (96.616 kg)  BMI 37.74 kg/m2  General: Well-nourished, well-developed in no acute distress.  Eyes: No icterus. Mouth: Oropharyngeal mucosa moist and pink , no lesions erythema or exudate. Lungs: Clear to auscultation bilaterally.  Heart: Regular rate and rhythm, no murmurs rubs or gallops.  Abdomen: Bowel sounds are normal, mild diffuse tenderness. Large diastasis recti +/- herniation, nondistended, no hepatosplenomegaly  or masses, no abdominal bruits or hernia , no rebound or guarding.  Exam limited due to body habitus. Extremities: No lower extremity edema. No clubbing or deformities. Neuro: Alert and oriented x 4   Skin: Warm and dry, no jaundice.   Psych: Alert and cooperative, normal mood and affect.

## 2014-02-22 NOTE — Patient Instructions (Addendum)
1. Please collect stool specimen and return to lab. 2. Please have your lab work done.  3. Trial of dicyclomine up to four times daily before meals and at bedtime for abdominal pain and diarrhea. Please limit dose and use only as needed. Side effects include dry mouth, constipation, urinary retention with HIGH dose.

## 2014-02-23 DIAGNOSIS — Z23 Encounter for immunization: Secondary | ICD-10-CM | POA: Diagnosis not present

## 2014-02-28 ENCOUNTER — Other Ambulatory Visit: Payer: Self-pay | Admitting: Gastroenterology

## 2014-02-28 DIAGNOSIS — R197 Diarrhea, unspecified: Secondary | ICD-10-CM | POA: Diagnosis not present

## 2014-02-28 DIAGNOSIS — R1013 Epigastric pain: Secondary | ICD-10-CM | POA: Diagnosis not present

## 2014-02-28 DIAGNOSIS — R1032 Left lower quadrant pain: Secondary | ICD-10-CM | POA: Diagnosis not present

## 2014-03-01 LAB — CBC WITH DIFFERENTIAL/PLATELET
BASOS PCT: 0 % (ref 0–1)
Basophils Absolute: 0 10*3/uL (ref 0.0–0.1)
EOS ABS: 0.1 10*3/uL (ref 0.0–0.7)
Eosinophils Relative: 1 % (ref 0–5)
HCT: 44.1 % (ref 36.0–46.0)
Hemoglobin: 15.3 g/dL — ABNORMAL HIGH (ref 12.0–15.0)
LYMPHS ABS: 2.1 10*3/uL (ref 0.7–4.0)
Lymphocytes Relative: 22 % (ref 12–46)
MCH: 28.9 pg (ref 26.0–34.0)
MCHC: 34.7 g/dL (ref 30.0–36.0)
MCV: 83.2 fL (ref 78.0–100.0)
MONOS PCT: 5 % (ref 3–12)
MPV: 10.4 fL (ref 9.4–12.4)
Monocytes Absolute: 0.5 10*3/uL (ref 0.1–1.0)
Neutro Abs: 6.9 10*3/uL (ref 1.7–7.7)
Neutrophils Relative %: 72 % (ref 43–77)
Platelets: 262 10*3/uL (ref 150–400)
RBC: 5.3 MIL/uL — AB (ref 3.87–5.11)
RDW: 14.9 % (ref 11.5–15.5)
WBC: 9.6 10*3/uL (ref 4.0–10.5)

## 2014-03-01 LAB — COMPREHENSIVE METABOLIC PANEL
ALT: 15 U/L (ref 0–35)
AST: 21 U/L (ref 0–37)
Albumin: 3.9 g/dL (ref 3.5–5.2)
Alkaline Phosphatase: 87 U/L (ref 39–117)
BILIRUBIN TOTAL: 0.5 mg/dL (ref 0.2–1.2)
BUN: 26 mg/dL — AB (ref 6–23)
CALCIUM: 9.5 mg/dL (ref 8.4–10.5)
CO2: 23 meq/L (ref 19–32)
CREATININE: 1.36 mg/dL — AB (ref 0.50–1.10)
Chloride: 104 mEq/L (ref 96–112)
GLUCOSE: 128 mg/dL — AB (ref 70–99)
Potassium: 5.4 mEq/L — ABNORMAL HIGH (ref 3.5–5.3)
Sodium: 140 mEq/L (ref 135–145)
Total Protein: 6.9 g/dL (ref 6.0–8.3)

## 2014-03-01 LAB — GIARDIA/CRYPTOSPORIDIUM (EIA)
CRYPTOSPORIDIUM SCREEN (EIA) (SOL): NEGATIVE
GIARDIA SCREEN (EIA): NEGATIVE

## 2014-03-01 LAB — TISSUE TRANSGLUTAMINASE, IGA

## 2014-03-01 LAB — IGA: IGA: 251 mg/dL (ref 69–380)

## 2014-03-01 LAB — CLOSTRIDIUM DIFFICILE BY PCR: CDIFFPCR: NOT DETECTED

## 2014-03-01 NOTE — Progress Notes (Signed)
Quick Note:  Stool studies negative. Evidence of mild hyperkalemia and mild dehydration. Looks like Creatinine at or better than baseline. Would encourage increased fluid intake. Make sure not on any potassium supplements.  Await celiac screen. Repeat met-7 Monday. ______

## 2014-03-02 ENCOUNTER — Other Ambulatory Visit: Payer: Self-pay

## 2014-03-02 ENCOUNTER — Other Ambulatory Visit: Payer: Self-pay | Admitting: Gastroenterology

## 2014-03-02 DIAGNOSIS — E875 Hyperkalemia: Secondary | ICD-10-CM

## 2014-03-02 DIAGNOSIS — E860 Dehydration: Secondary | ICD-10-CM

## 2014-03-02 NOTE — Progress Notes (Signed)
cc'ed to pcp °

## 2014-03-04 ENCOUNTER — Telehealth: Payer: Self-pay

## 2014-03-04 ENCOUNTER — Other Ambulatory Visit: Payer: Self-pay | Admitting: *Deleted

## 2014-03-04 LAB — STOOL CULTURE

## 2014-03-04 NOTE — Telephone Encounter (Signed)
Attempted to call patient to find out how much Coumadin she had left. Had to leave a message.

## 2014-03-04 NOTE — Telephone Encounter (Signed)
Left message to call/lr

## 2014-03-07 ENCOUNTER — Other Ambulatory Visit: Payer: Self-pay | Admitting: Cardiology

## 2014-03-07 MED ORDER — WARFARIN SODIUM 5 MG PO TABS: ORAL_TABLET | ORAL | Status: DC

## 2014-03-07 NOTE — Telephone Encounter (Signed)
Please address this. 

## 2014-03-08 ENCOUNTER — Ambulatory Visit (INDEPENDENT_AMBULATORY_CARE_PROVIDER_SITE_OTHER): Payer: Medicare Other | Admitting: *Deleted

## 2014-03-08 DIAGNOSIS — I482 Chronic atrial fibrillation: Secondary | ICD-10-CM | POA: Diagnosis not present

## 2014-03-08 DIAGNOSIS — I4891 Unspecified atrial fibrillation: Secondary | ICD-10-CM | POA: Diagnosis not present

## 2014-03-08 DIAGNOSIS — Z5181 Encounter for therapeutic drug level monitoring: Secondary | ICD-10-CM

## 2014-03-08 DIAGNOSIS — Z7901 Long term (current) use of anticoagulants: Secondary | ICD-10-CM | POA: Diagnosis not present

## 2014-03-08 DIAGNOSIS — I4821 Permanent atrial fibrillation: Secondary | ICD-10-CM

## 2014-03-08 LAB — POCT INR: INR: 3.7

## 2014-03-10 DIAGNOSIS — E1165 Type 2 diabetes mellitus with hyperglycemia: Secondary | ICD-10-CM | POA: Diagnosis not present

## 2014-03-10 DIAGNOSIS — E785 Hyperlipidemia, unspecified: Secondary | ICD-10-CM | POA: Diagnosis not present

## 2014-03-10 DIAGNOSIS — I10 Essential (primary) hypertension: Secondary | ICD-10-CM | POA: Diagnosis not present

## 2014-03-18 DIAGNOSIS — E6609 Other obesity due to excess calories: Secondary | ICD-10-CM | POA: Diagnosis not present

## 2014-03-18 DIAGNOSIS — E1122 Type 2 diabetes mellitus with diabetic chronic kidney disease: Secondary | ICD-10-CM | POA: Diagnosis not present

## 2014-03-18 DIAGNOSIS — E782 Mixed hyperlipidemia: Secondary | ICD-10-CM | POA: Diagnosis not present

## 2014-03-18 DIAGNOSIS — I10 Essential (primary) hypertension: Secondary | ICD-10-CM | POA: Diagnosis not present

## 2014-03-23 NOTE — Progress Notes (Signed)
Quick Note:  Celiac negative.  Remind her to have her met-7 done.  How is her diarrhea? ______

## 2014-03-28 DIAGNOSIS — M797 Fibromyalgia: Secondary | ICD-10-CM | POA: Diagnosis not present

## 2014-03-28 DIAGNOSIS — M2550 Pain in unspecified joint: Secondary | ICD-10-CM | POA: Diagnosis not present

## 2014-03-28 DIAGNOSIS — Z79899 Other long term (current) drug therapy: Secondary | ICD-10-CM | POA: Diagnosis not present

## 2014-03-28 DIAGNOSIS — M545 Low back pain: Secondary | ICD-10-CM | POA: Diagnosis not present

## 2014-03-28 NOTE — Progress Notes (Signed)
Quick Note:  Pt is aware of results. She said she had the labs done on 03/10/2014 at Dr. Liliane Channel. I have called his office and LMOM for them to fax [place] the results to [facility].  Pt also said that she is still having diarrhea sometimes about 5-6 times a day.  Please advise! ______

## 2014-03-28 NOTE — Progress Notes (Signed)
Quick Note:  LMOM to call. ______ 

## 2014-03-29 ENCOUNTER — Ambulatory Visit (INDEPENDENT_AMBULATORY_CARE_PROVIDER_SITE_OTHER): Payer: Medicare Other | Admitting: *Deleted

## 2014-03-29 DIAGNOSIS — I482 Chronic atrial fibrillation: Secondary | ICD-10-CM

## 2014-03-29 DIAGNOSIS — Z5181 Encounter for therapeutic drug level monitoring: Secondary | ICD-10-CM | POA: Diagnosis not present

## 2014-03-29 DIAGNOSIS — I4891 Unspecified atrial fibrillation: Secondary | ICD-10-CM

## 2014-03-29 DIAGNOSIS — Z7901 Long term (current) use of anticoagulants: Secondary | ICD-10-CM

## 2014-03-29 DIAGNOSIS — I4821 Permanent atrial fibrillation: Secondary | ICD-10-CM

## 2014-03-29 LAB — POCT INR: INR: 2.4

## 2014-03-29 NOTE — Progress Notes (Signed)
Quick Note:  Labs are in [place] cart. ______

## 2014-04-13 ENCOUNTER — Other Ambulatory Visit: Payer: Self-pay | Admitting: Gastroenterology

## 2014-04-13 MED ORDER — ELUXADOLINE 100 MG PO TABS: 100.0000 mg | ORAL_TABLET | Freq: Two times a day (BID) | ORAL | Status: DC

## 2014-04-13 NOTE — Progress Notes (Signed)
Quick Note:  Reviewed labs dated 03/10/2014. Creatinine 1.46, BUN 21, potassium 4.9, total bilirubin 0.4, alkaline phosphatase 86, AST 17, ALT 13, hemoglobin A1c 8.1. Potassium improved, normal. Other labs stable.  Please let patient know I sent in RX for Viberzi take 100mg  BID with food for IBS-diarrhea. Hold if has no BM for 2 days. Needs return OV in 4 weeks. ______

## 2014-04-13 NOTE — Progress Notes (Signed)
Quick Note:  Pt is aware of results and prescription. Routing to [place] to made appt in 4 weeks. ______

## 2014-04-14 ENCOUNTER — Encounter: Payer: Self-pay | Admitting: Internal Medicine

## 2014-04-14 MED ORDER — ELUXADOLINE 100 MG PO TABS: 100.0000 mg | ORAL_TABLET | Freq: Two times a day (BID) | ORAL | Status: DC

## 2014-04-14 NOTE — Progress Notes (Signed)
APPOINTMENT MADE AND LETTER SENT °

## 2014-04-14 NOTE — Addendum Note (Signed)
Addended by: Mahala Menghini on: 04/14/2014 02:43 PM   Modules accepted: Orders

## 2014-04-19 LAB — HEMOGLOBIN A1C: HEMOGLOBIN A1C: 7.6

## 2014-04-25 DIAGNOSIS — K219 Gastro-esophageal reflux disease without esophagitis: Secondary | ICD-10-CM | POA: Diagnosis not present

## 2014-04-25 DIAGNOSIS — E1122 Type 2 diabetes mellitus with diabetic chronic kidney disease: Secondary | ICD-10-CM | POA: Diagnosis not present

## 2014-04-25 DIAGNOSIS — I10 Essential (primary) hypertension: Secondary | ICD-10-CM | POA: Diagnosis not present

## 2014-04-25 DIAGNOSIS — N183 Chronic kidney disease, stage 3 (moderate): Secondary | ICD-10-CM | POA: Diagnosis not present

## 2014-04-26 ENCOUNTER — Ambulatory Visit (INDEPENDENT_AMBULATORY_CARE_PROVIDER_SITE_OTHER): Payer: Medicare Other | Admitting: *Deleted

## 2014-04-26 DIAGNOSIS — Z7901 Long term (current) use of anticoagulants: Secondary | ICD-10-CM | POA: Diagnosis not present

## 2014-04-26 DIAGNOSIS — I482 Chronic atrial fibrillation: Secondary | ICD-10-CM

## 2014-04-26 DIAGNOSIS — I4891 Unspecified atrial fibrillation: Secondary | ICD-10-CM

## 2014-04-26 DIAGNOSIS — I4821 Permanent atrial fibrillation: Secondary | ICD-10-CM

## 2014-04-26 DIAGNOSIS — Z5181 Encounter for therapeutic drug level monitoring: Secondary | ICD-10-CM

## 2014-04-26 LAB — POCT INR: INR: 2.6

## 2014-05-12 DIAGNOSIS — H61009 Unspecified perichondritis of external ear, unspecified ear: Secondary | ICD-10-CM | POA: Diagnosis not present

## 2014-05-12 DIAGNOSIS — L820 Inflamed seborrheic keratosis: Secondary | ICD-10-CM | POA: Diagnosis not present

## 2014-05-12 DIAGNOSIS — C44619 Basal cell carcinoma of skin of left upper limb, including shoulder: Secondary | ICD-10-CM | POA: Diagnosis not present

## 2014-05-13 ENCOUNTER — Encounter: Payer: Medicare Other | Attending: "Endocrinology | Admitting: Nutrition

## 2014-05-13 ENCOUNTER — Encounter: Payer: Self-pay | Admitting: Nutrition

## 2014-05-13 VITALS — Ht 63.0 in | Wt 211.0 lb

## 2014-05-13 DIAGNOSIS — IMO0002 Reserved for concepts with insufficient information to code with codable children: Secondary | ICD-10-CM

## 2014-05-13 DIAGNOSIS — Z713 Dietary counseling and surveillance: Secondary | ICD-10-CM | POA: Diagnosis not present

## 2014-05-13 DIAGNOSIS — Z794 Long term (current) use of insulin: Secondary | ICD-10-CM | POA: Insufficient documentation

## 2014-05-13 DIAGNOSIS — Z6837 Body mass index (BMI) 37.0-37.9, adult: Secondary | ICD-10-CM | POA: Insufficient documentation

## 2014-05-13 DIAGNOSIS — E1165 Type 2 diabetes mellitus with hyperglycemia: Secondary | ICD-10-CM

## 2014-05-13 DIAGNOSIS — E118 Type 2 diabetes mellitus with unspecified complications: Secondary | ICD-10-CM | POA: Diagnosis not present

## 2014-05-13 NOTE — Progress Notes (Signed)
  Medical Nutrition Therapy:  Appt start time: 1100 end time:  1200.  Assessment:  Primary concerns today: DIabetes. LIves with her sons. She does the shopping and cooking and her sons help too. Test blood sugars 4 times per day. 36 units of Lantus, Victoza and Humalog 6 units with meals.Sometimes only 2-3 units if her blood sugars gets lower than 80.  Walks some for exercise. Has a bad hip and can't exercise much. Has some shoulder problems also. Cooks mostly at home. Most recent A1c WAS 8.1%.  Preferred Learning Style:   Auditory  Visual  Hands on   Learning Readiness:   Ready  Change in progress  MEDICATIONS: See list   DIETARY INTAKE:  24-hr recall:  B ( AM): Cranberry muffin, orange, water Snk ( AM): crackers and pb sometimes.  L ( PM): Cheeseburger, water Snk ( PM): none D ( PM): Pork ribs, green peas, water Snk ( PM): pb 1 tbsp or crackers Beverages: water  Usual physical activity: limited but walks some.  Estimated energy needs: 1500 calories 170 g carbohydrates 112 g protein 42 g fat  Progress Towards Goal(s):  In progress.   Nutritional Diagnosis:  NB-1.1 Food and nutrition-related knowledge deficit As related to Diabetes.  As evidenced by A1C 8.1%.    Intervention:  Nutrition counseling and diabetes education on diet,meal planning, CHO counting, portion sizes, signs/symptoms of hyper/hypoglycemia and treatment, prevention of complications and benefits of exercise for Dm and weight loss.  Goals:  Follow Diabetes Meal Plan as instructed  Eat 3 meals a day  Avoid snacks between meals  Increase vegetables and fresh fruit  Limit carbohydrate intake to 30-45 grams carbohydrate/meal  Add lean protein foods to meals  Monitor glucose levels as instructed by your doctor  Aim for 30 mins of physical activity daily  Bring food record and glucose log to your next nutrition visit  Goal: Lose 1 lb per week. 2. Get A1C down to 7% in three  months.   Teaching Method Utilized: See list Visual Auditory Hands on  Handouts given during visit include: The Plate Method Carb Counting and Food Label handouts Meal Plan Card  Barriers to learning/adherence to lifestyle change: none  Demonstrated degree of understanding via:  Teach Back   Monitoring/Evaluation:  Dietary intake, exercise, meal planning SBG, and body weight in 1 month(s).

## 2014-05-13 NOTE — Patient Instructions (Signed)
Goals:  Follow Diabetes Meal Plan as instructed  Eat 3 meals a day  Avoid snacks between meals  Increase vegetables and fresh fruit  Limit carbohydrate intake to 30-45 grams carbohydrate/meal  Add lean protein foods to meals  Monitor glucose levels as instructed by your doctor  Aim for 30 mins of physical activity daily  Bring food record and glucose log to your next nutrition visit  Goal: Lose 1 lb per week. 2. Get A1C down to 7% in three months.

## 2014-05-16 ENCOUNTER — Encounter: Payer: Self-pay | Admitting: Gastroenterology

## 2014-05-16 ENCOUNTER — Ambulatory Visit (INDEPENDENT_AMBULATORY_CARE_PROVIDER_SITE_OTHER): Payer: Medicare Other | Admitting: Gastroenterology

## 2014-05-16 VITALS — BP 127/77 | HR 66 | Temp 97.6°F | Ht 63.0 in | Wt 210.0 lb

## 2014-05-16 DIAGNOSIS — D369 Benign neoplasm, unspecified site: Secondary | ICD-10-CM | POA: Diagnosis not present

## 2014-05-16 DIAGNOSIS — K589 Irritable bowel syndrome without diarrhea: Secondary | ICD-10-CM | POA: Diagnosis not present

## 2014-05-16 DIAGNOSIS — K219 Gastro-esophageal reflux disease without esophagitis: Secondary | ICD-10-CM

## 2014-05-16 MED ORDER — PANTOPRAZOLE SODIUM 40 MG PO TBEC: 40.0000 mg | DELAYED_RELEASE_TABLET | Freq: Two times a day (BID) | ORAL | Status: DC

## 2014-05-16 NOTE — Assessment & Plan Note (Signed)
Next colonoscopy due September 2016. Plan for follow-up in August of this year.

## 2014-05-16 NOTE — Assessment & Plan Note (Signed)
Breakthrough symptoms now on daily PPI.  Prescription for pantoprazole 40 mg twice a day before evening meal and breakfast. She recently refilled her medication. We will supplement her prescription when Nexium 24 hour to take 1 before evening meal until she's able to get her new prescription filled.  Some vague dysphagia to rice and mash potatoes only. Previous dilation helped a lot. Consider repeat esophageal dilation at time of upcoming colonoscopy in September if she has progressive symptoms. She will let us know what she would like when we see her in follow-up in August.

## 2014-05-16 NOTE — Progress Notes (Signed)
Primary Care Physician: Delphina Cahill, MD  Primary Gastroenterologist:  Garfield Cornea, MD   Chief Complaint  Patient presents with  . Diarrhea  . Irritable Bowel Syndrome    HPI: [name] is a 80 y.o. female here for follow-up. She has a history of diarrhea, abdominal pain, IBS, GERD, comp [name] diverticulitis requiring partial colectomy in the past. Last colonoscopy September 2013, suboptimal preparation, multiple diminutive polyps at 10 cm from the anal verge, anastomosis at 25 cm, multiple 3-5 mm polyps at CV, splenic flexure, descending segments. Path revealed tubular adenomas. Next colonoscopy due September 2016. EGD September 2013 revealed small hiatal hernia, hyperplastic appearing polyps, Maloney dilation performed for history of dysphagia. Patient was previously evaluated for microscopic colitis in 2010.  Patient was seen back in November with worsening of diarrhea. She completed stool studies which were negative. Celiac screen was unremarkable. She was started on Viberzi he 100 mg twice a day. Was not covered by insurance.   Wake up with nocturnal reflux on pantoprazole once per day. Previously was on Nexium twice a day with insurance made her switch to pantoprazole. She believes she needs twice a day dosing. Lot of regurgitation with bending over. No dysphagia. BM fairly normal right now. 2-5 times per day instead of 10 per day. Abdominal cramping before BM. Abdominal pain improved with BM. Takes bentyl just once per day right now. No melena, brbpr.   Dr. Dorris Fetch making her go to kidney specialist in [facility] worsening kidney function.    Current Outpatient Prescriptions  Medication Sig Dispense Refill  . acetaminophen (TYLENOL) 325 MG tablet Take 650 mg by mouth every 6 (six) hours as needed for pain.     [name] albuterol (PROAIR HFA) 108 (90 BASE) MCG/ACT inhaler Inhale 2 puffs into the lungs every 4 (four) hours as needed. Shortness of Breath    . ALPRAZolam (XANAX) 1 MG  tablet Take 1 mg by mouth 3 (three) times daily as needed for anxiety.     [name] BYETTA 10 MCG PEN 10 MCG/0.04ML SOLN Inject 10 mcg into the skin Twice daily.    . cloNIDine (CATAPRES - DOSED IN MG/24 HR) 0.1 mg/24hr patch APPLY 1 PATCH ONCE A WEEK AS DIRECTED 4 patch 6  . dicyclomine (BENTYL) 10 MG capsule Take 1 capsule (10 mg total) by mouth 4 (four) times daily -  before meals and at bedtime. As needed for abdominal pain and diarrhea. 120 capsule 1  . hydrochlorothiazide 50 MG tablet Take 50 mg by mouth daily.     . insulin glargine (LANTUS) 100 UNIT/ML injection Inject 36 Units into the skin at bedtime.     . insulin lispro (HUMALOG) 100 UNIT/ML injection Inject 6 Units into the skin 3 (three) times daily.     [name] lidocaine (LIDODERM) 5 % Place 1 patch onto the skin daily as needed (Pain). Remove & Discard patch within 12 hours or as directed by MD    . Liraglutide 18 MG/3ML SOPN Inject 1.8 mg into the skin.    . metoprolol (LOPRESSOR) 50 MG tablet Take 50 mg by mouth 2 (two) times daily.     . ondansetron (ZOFRAN-ODT) 4 MG disintegrating tablet Take 4 mg by mouth every 8 (eight) hours as needed. Nausea and Vomiting    . oxyCODONE (ROXICODONE) 15 MG immediate release tablet Take 15 mg by mouth every 6 (six) hours as needed. Pain    . pantoprazole (PROTONIX) 40 MG tablet Take 40 mg by  mouth daily.    . simvastatin (ZOCOR) 20 MG tablet Take 1 tablet by mouth daily.    . tizanidine (ZANAFLEX) 2 MG capsule Take 2 mg by mouth 3 (three) times daily as needed for muscle spasms.     [name] warfarin (COUMADIN) 5 MG tablet Take 1 tablet daily except 1/2 tablet on Sundays, Tuesdays and Thursdays (Patient taking differently: Take 1/2 tablet daily except 1 tablet on Mondays, Wednesdays and Fridays) 30 tablet 5   No current facility-administered medications for this visit.    Allergies as of 05/16/2014 - Review Complete 05/16/2014  Allergen Reaction Noted  . Adhesive [tape] Itching 11/29/2011  . Demerol  [meperidine]  02/22/2014  . Iohexol Nausea And Vomiting 03/08/2004  . Meperidine hcl Nausea And Vomiting   . Shellfish allergy Diarrhea and Nausea And Vomiting 08/03/2012  . Penicillins Nausea And Vomiting and Rash    Past Medical History  Diagnosis Date  . Diarrhea   . Esophageal reflux   . OSA (obstructive sleep apnea)   . Fibromyalgia   . Nephrolithiasis   . Anxiety   . Mixed hyperlipidemia   . Essential hypertension, benign   . Colonoscopy causing post-procedural bleeding     Incomplete. by Dr. [name] 06/11/99 due to fixed non-compliant colon precluded exam to 40 cm, she had subsequent colectomy for diverticulitis since that time.   . Chronic back pain   . Hiatal hernia     Recent EGD/TCS showed small hiatal hernia, normal apperaring tubular esophagus. s/p passage of a 54-french Maloney dilator, s/p biopsy esophageal mucosa, multiple fundal gland type gastric polyps. one large pedunculated polp with oozing, noted s/p clipping and snare polypectomy. Biopsies of the gastric mucosa taken given her symptoms in her elevated count. normal D1-D3, s/p biospy D2-D3.   . Hiatal hernia     all bx unremarkable. on TCS she had left-sided diverticula, evidence of prior segmental resection with anastomosis, multiple colonic polyps s/p multiple snare polypectomies, s/p segmental biopsy and stool sampling. she had multiple tubular adenomas. no microscopic/collagenous colitis. stool culture, c diff, O+P, lactoferrin were negative.   . Ureteral stent retained     Not retained. ureteral stent removal gross hematuria resolved 10/11. coumadin restarted.   . Type 2 diabetes mellitus   . Permanent atrial fibrillation     Previous failed DCCV  . Asthma   . Ventral hernia    Past Surgical History  Procedure Laterality Date  . Total abdominal hysterectomy    . Tonsillectomy    . Colectomy      20 05. Dr. Geoffry Paradise for diverticulitis  . Esophagogastroduodenoscopy  02/22/09    normal s/p dilator;small  HH/one large pedunculates polyp  s/p clipping, hyperplastic  . Colonoscopy  02/22/09    normal/left-sided diverticula/multiple colonic polyp, adenomatous  . Breast lumpectomy      benign cyst  . Colonoscopy  12/16/2011    colonic polyps-treated as described above. Status postprior sigmoid resection. adenomatous. next TCS 12/2014  . Cataract extraction w/phaco Left 08/17/2012    Procedure: CATARACT EXTRACTION PHACO AND INTRAOCULAR LENS PLACEMENT (IOC);  Surgeon: [name], MD;  Location: [facility];  Service: Ophthalmology;  Laterality: Left;  CDE:18.73  . Esophagogastroduodenoscopy  12/16/2011    [name]: small hiatal hernia. Hyperplastic apperating polyps. Status post [name] dilation as described above.    ROS:  General: Negative for anorexia, weight loss, fever, chills, fatigue, weakness. ENT: Negative for hoarseness, difficulty swallowing , nasal congestion. CV: Negative for chest pain, angina, palpitations, dyspnea on exertion, peripheral  edema.  Respiratory: Negative for dyspnea at rest, dyspnea on exertion, cough, sputum, wheezing.  GI: See history of present illness. GU:  Negative for dysuria, hematuria, urinary incontinence, urinary frequency, nocturnal urination.  Endo: Negative for unusual weight change.    Physical Examination:   BP 127/77 mmHg  Pulse 66  Temp(Src) 97.6 F (36.4 C) (Oral)  Ht 5\' 3"  (1.6 m)  Wt 210 lb (95.255 kg)  BMI 37.21 kg/m2  General: Well-nourished, well-developed in no acute distress.  Eyes: No icterus. Mouth: Oropharyngeal mucosa moist and pink , no lesions erythema or exudate. Lungs: Clear to auscultation bilaterally.  Heart: Regular rate and rhythm, no murmurs rubs or gallops.  Abdomen: Bowel sounds are normal, nontender, nondistended, no hepatosplenomegaly or masses, no abdominal bruits, no rebound or guarding.  Large diastases recti plus or minus herniation. Exam limited due to body habitus and patient cannot lay on her back due to  vertigo. Extremities: No lower extremity edema. No clubbing or deformities. Neuro: Alert and oriented x 4   Skin: Warm and dry, no jaundice.   Psych: Alert and cooperative, normal mood and affect.   Imaging Studies: No results found.

## 2014-05-16 NOTE — Assessment & Plan Note (Signed)
Doing better at this time. Symptoms controlled on Bentyl once daily. May increase to twice daily if needed but hold for constipation.

## 2014-05-16 NOTE — Patient Instructions (Signed)
1. Prescription for pantoprazole 40 mg before breakfast and before evening meal sent to your pharmacy. Until you are able to get this filled, you can take a dose of Nexium 24 hour one before your evening meal to supplement your current prescription. Samples provided. 2. Return to office in 11/2014 to schedule your colonoscopy.

## 2014-05-17 NOTE — Progress Notes (Signed)
cc'ed to pcp °

## 2014-05-26 ENCOUNTER — Ambulatory Visit (INDEPENDENT_AMBULATORY_CARE_PROVIDER_SITE_OTHER): Payer: Medicare Other | Admitting: *Deleted

## 2014-05-26 DIAGNOSIS — M545 Low back pain: Secondary | ICD-10-CM | POA: Diagnosis not present

## 2014-05-26 DIAGNOSIS — I4821 Permanent atrial fibrillation: Secondary | ICD-10-CM

## 2014-05-26 DIAGNOSIS — Z5181 Encounter for therapeutic drug level monitoring: Secondary | ICD-10-CM

## 2014-05-26 DIAGNOSIS — I482 Chronic atrial fibrillation: Secondary | ICD-10-CM

## 2014-05-26 DIAGNOSIS — Z7901 Long term (current) use of anticoagulants: Secondary | ICD-10-CM

## 2014-05-26 DIAGNOSIS — M2550 Pain in unspecified joint: Secondary | ICD-10-CM | POA: Diagnosis not present

## 2014-05-26 DIAGNOSIS — I4891 Unspecified atrial fibrillation: Secondary | ICD-10-CM

## 2014-05-26 DIAGNOSIS — Z79899 Other long term (current) drug therapy: Secondary | ICD-10-CM | POA: Diagnosis not present

## 2014-05-26 DIAGNOSIS — M797 Fibromyalgia: Secondary | ICD-10-CM | POA: Diagnosis not present

## 2014-05-26 LAB — POCT INR: INR: 1.9

## 2014-05-27 DIAGNOSIS — I4891 Unspecified atrial fibrillation: Secondary | ICD-10-CM | POA: Diagnosis not present

## 2014-05-27 DIAGNOSIS — R10819 Abdominal tenderness, unspecified site: Secondary | ICD-10-CM | POA: Diagnosis not present

## 2014-05-27 DIAGNOSIS — I10 Essential (primary) hypertension: Secondary | ICD-10-CM | POA: Diagnosis not present

## 2014-05-27 DIAGNOSIS — M797 Fibromyalgia: Secondary | ICD-10-CM | POA: Diagnosis not present

## 2014-05-27 DIAGNOSIS — N132 Hydronephrosis with renal and ureteral calculous obstruction: Secondary | ICD-10-CM | POA: Diagnosis not present

## 2014-05-27 DIAGNOSIS — K589 Irritable bowel syndrome without diarrhea: Secondary | ICD-10-CM | POA: Diagnosis not present

## 2014-05-27 DIAGNOSIS — E780 Pure hypercholesterolemia: Secondary | ICD-10-CM | POA: Diagnosis not present

## 2014-05-27 DIAGNOSIS — Z79899 Other long term (current) drug therapy: Secondary | ICD-10-CM | POA: Diagnosis not present

## 2014-05-27 DIAGNOSIS — F419 Anxiety disorder, unspecified: Secondary | ICD-10-CM | POA: Diagnosis not present

## 2014-05-27 DIAGNOSIS — M549 Dorsalgia, unspecified: Secondary | ICD-10-CM | POA: Diagnosis not present

## 2014-05-27 DIAGNOSIS — K219 Gastro-esophageal reflux disease without esophagitis: Secondary | ICD-10-CM | POA: Diagnosis not present

## 2014-05-27 DIAGNOSIS — R109 Unspecified abdominal pain: Secondary | ICD-10-CM | POA: Diagnosis not present

## 2014-05-27 DIAGNOSIS — Z90710 Acquired absence of both cervix and uterus: Secondary | ICD-10-CM | POA: Diagnosis not present

## 2014-05-27 DIAGNOSIS — R319 Hematuria, unspecified: Secondary | ICD-10-CM | POA: Diagnosis not present

## 2014-05-28 DIAGNOSIS — N132 Hydronephrosis with renal and ureteral calculous obstruction: Secondary | ICD-10-CM | POA: Diagnosis not present

## 2014-06-09 DIAGNOSIS — R5383 Other fatigue: Secondary | ICD-10-CM | POA: Diagnosis not present

## 2014-06-09 DIAGNOSIS — Z79899 Other long term (current) drug therapy: Secondary | ICD-10-CM | POA: Diagnosis not present

## 2014-06-09 DIAGNOSIS — E785 Hyperlipidemia, unspecified: Secondary | ICD-10-CM | POA: Diagnosis not present

## 2014-06-09 DIAGNOSIS — M810 Age-related osteoporosis without current pathological fracture: Secondary | ICD-10-CM | POA: Diagnosis not present

## 2014-06-09 DIAGNOSIS — E1122 Type 2 diabetes mellitus with diabetic chronic kidney disease: Secondary | ICD-10-CM | POA: Diagnosis not present

## 2014-06-09 DIAGNOSIS — E559 Vitamin D deficiency, unspecified: Secondary | ICD-10-CM | POA: Diagnosis not present

## 2014-06-09 DIAGNOSIS — E782 Mixed hyperlipidemia: Secondary | ICD-10-CM | POA: Diagnosis not present

## 2014-06-09 DIAGNOSIS — E538 Deficiency of other specified B group vitamins: Secondary | ICD-10-CM | POA: Diagnosis not present

## 2014-06-09 DIAGNOSIS — Z713 Dietary counseling and surveillance: Secondary | ICD-10-CM | POA: Diagnosis not present

## 2014-06-09 DIAGNOSIS — I10 Essential (primary) hypertension: Secondary | ICD-10-CM | POA: Diagnosis not present

## 2014-06-17 DIAGNOSIS — E6609 Other obesity due to excess calories: Secondary | ICD-10-CM | POA: Diagnosis not present

## 2014-06-17 DIAGNOSIS — I10 Essential (primary) hypertension: Secondary | ICD-10-CM | POA: Diagnosis not present

## 2014-06-17 DIAGNOSIS — E782 Mixed hyperlipidemia: Secondary | ICD-10-CM | POA: Diagnosis not present

## 2014-06-17 DIAGNOSIS — E1165 Type 2 diabetes mellitus with hyperglycemia: Secondary | ICD-10-CM | POA: Diagnosis not present

## 2014-06-22 ENCOUNTER — Encounter: Payer: Medicare Other | Attending: "Endocrinology | Admitting: Nutrition

## 2014-06-22 ENCOUNTER — Encounter: Payer: Self-pay | Admitting: Nutrition

## 2014-06-22 VITALS — Ht 63.0 in | Wt 205.2 lb

## 2014-06-22 DIAGNOSIS — Z713 Dietary counseling and surveillance: Secondary | ICD-10-CM | POA: Diagnosis not present

## 2014-06-22 DIAGNOSIS — Z794 Long term (current) use of insulin: Secondary | ICD-10-CM | POA: Diagnosis not present

## 2014-06-22 DIAGNOSIS — E669 Obesity, unspecified: Secondary | ICD-10-CM | POA: Diagnosis not present

## 2014-06-22 DIAGNOSIS — E1165 Type 2 diabetes mellitus with hyperglycemia: Secondary | ICD-10-CM

## 2014-06-22 DIAGNOSIS — Z6836 Body mass index (BMI) 36.0-36.9, adult: Secondary | ICD-10-CM | POA: Diagnosis not present

## 2014-06-22 DIAGNOSIS — IMO0002 Reserved for concepts with insufficient information to code with codable children: Secondary | ICD-10-CM

## 2014-06-22 DIAGNOSIS — E118 Type 2 diabetes mellitus with unspecified complications: Secondary | ICD-10-CM | POA: Diagnosis not present

## 2014-06-22 NOTE — Progress Notes (Signed)
  Medical Nutrition Therapy:  Appt start time: 1640 end time:  1645.  Assessment:  Primary concerns today: DIabetes. Brought her food journal.BS,86-115 mg/cl  A1C down to 7.6% now from 8.1%.  Lost 6 lbs since last visit.  Lantus 36 daily. and Humalog 4 units before meals. FBS look good, Dinner and HS blood sugars are upper 100's and low 200's. Diet recall reveals she is eating much better balanced meals. Needs to work on increasing physical activity. Cooks mostly at home.   Preferred Learning Style:   Auditory  Visual  Hands on   Learning Readiness:   Ready  Change in progress  MEDICATIONS: See list   DIETARY INTAKE:  24-hr recall:  B ( AM): 1 egg, 1 slice toast, 1 piece of fruit Snk ( AM):  L ( PM): hamburger , onions, fruit and 1 slice bread, water Snk ( PM): none D ( PM): beef,, toss salad, fruit and corn, water Snk ( PM): Beverages: water  Usual physical activity: limited but walks some.  Estimated energy needs: 1500 calories 170 g carbohydrates 112 g protein 42 g fat  Progress Towards Goal(s):  In progress.   Nutritional Diagnosis:  NB-1.1 Food and nutrition-related knowledge deficit As related to Diabetes.  As evidenced by A1C 8.1%.    Intervention:  Nutrition counseling and diabetes education on diet,meal planning, CHO counting, portion sizes, signs/symptoms of hyper/hypoglycemia and treatment, prevention of complications and benefits of exercise for Dm and weight loss.  Goals: Keep up the good work!!  Follow MY Plate as instructed.  Eat 3 meals a day  Avoid snacks between meals.  Walk 30 minutes 3 days per week.  Increase low carb vegetables.  Add whole grains for increased fiber.  Get A1C to 6.5% in three months.  Test blood sugars and take insulin as prescribed.  Lose 4 lbs a month til next visit.   Teaching Method Utilized: See list Visual Auditory Hands on  Handouts given during visit include: The Plate Method Carb Counting  and Food Label handouts Meal Plan Card  Barriers to learning/adherence to lifestyle change: none  Demonstrated degree of understanding via:  Teach Back   Monitoring/Evaluation:  Dietary intake, exercise, meal planning SBG, and body weight in 3 month(s).

## 2014-06-22 NOTE — Patient Instructions (Signed)
Goals: Keep up the good work!!  Follow MY Plate as instructed.  Eat 3 meals a day  Avoid snacks between meals.  Walk 30 minutes 3 days per week.  Increase low carb vegetables.  Add whole grains for increased fiber.  Get A1C to 6.5% in three months.  Test blood sugars and take insulin as prescribed.  Lose 4 lbs a month til next visit.

## 2014-06-23 ENCOUNTER — Ambulatory Visit (INDEPENDENT_AMBULATORY_CARE_PROVIDER_SITE_OTHER): Payer: Medicare Other | Admitting: *Deleted

## 2014-06-23 DIAGNOSIS — I482 Chronic atrial fibrillation: Secondary | ICD-10-CM

## 2014-06-23 DIAGNOSIS — Z5181 Encounter for therapeutic drug level monitoring: Secondary | ICD-10-CM

## 2014-06-23 DIAGNOSIS — Z7901 Long term (current) use of anticoagulants: Secondary | ICD-10-CM | POA: Diagnosis not present

## 2014-06-23 DIAGNOSIS — I4821 Permanent atrial fibrillation: Secondary | ICD-10-CM

## 2014-06-23 DIAGNOSIS — I4891 Unspecified atrial fibrillation: Secondary | ICD-10-CM | POA: Diagnosis not present

## 2014-06-23 LAB — POCT INR: INR: 1.4

## 2014-07-12 ENCOUNTER — Ambulatory Visit (INDEPENDENT_AMBULATORY_CARE_PROVIDER_SITE_OTHER): Payer: Medicare Other | Admitting: *Deleted

## 2014-07-12 DIAGNOSIS — Z7901 Long term (current) use of anticoagulants: Secondary | ICD-10-CM

## 2014-07-12 DIAGNOSIS — I482 Chronic atrial fibrillation: Secondary | ICD-10-CM

## 2014-07-12 DIAGNOSIS — N179 Acute kidney failure, unspecified: Secondary | ICD-10-CM | POA: Diagnosis not present

## 2014-07-12 DIAGNOSIS — Z5181 Encounter for therapeutic drug level monitoring: Secondary | ICD-10-CM | POA: Diagnosis not present

## 2014-07-12 DIAGNOSIS — I4891 Unspecified atrial fibrillation: Secondary | ICD-10-CM

## 2014-07-12 DIAGNOSIS — E1129 Type 2 diabetes mellitus with other diabetic kidney complication: Secondary | ICD-10-CM | POA: Diagnosis not present

## 2014-07-12 DIAGNOSIS — I10 Essential (primary) hypertension: Secondary | ICD-10-CM | POA: Diagnosis not present

## 2014-07-12 DIAGNOSIS — I4821 Permanent atrial fibrillation: Secondary | ICD-10-CM

## 2014-07-12 LAB — POCT INR: INR: 4.1

## 2014-07-15 DIAGNOSIS — D649 Anemia, unspecified: Secondary | ICD-10-CM | POA: Diagnosis not present

## 2014-07-15 DIAGNOSIS — Z79899 Other long term (current) drug therapy: Secondary | ICD-10-CM | POA: Diagnosis not present

## 2014-07-15 DIAGNOSIS — I129 Hypertensive chronic kidney disease with stage 1 through stage 4 chronic kidney disease, or unspecified chronic kidney disease: Secondary | ICD-10-CM | POA: Diagnosis not present

## 2014-07-15 DIAGNOSIS — R809 Proteinuria, unspecified: Secondary | ICD-10-CM | POA: Diagnosis not present

## 2014-07-15 DIAGNOSIS — E559 Vitamin D deficiency, unspecified: Secondary | ICD-10-CM | POA: Diagnosis not present

## 2014-07-15 DIAGNOSIS — N183 Chronic kidney disease, stage 3 (moderate): Secondary | ICD-10-CM | POA: Diagnosis not present

## 2014-07-21 DIAGNOSIS — I10 Essential (primary) hypertension: Secondary | ICD-10-CM | POA: Diagnosis not present

## 2014-07-21 DIAGNOSIS — E1129 Type 2 diabetes mellitus with other diabetic kidney complication: Secondary | ICD-10-CM | POA: Diagnosis not present

## 2014-07-21 DIAGNOSIS — N139 Obstructive and reflux uropathy, unspecified: Secondary | ICD-10-CM | POA: Diagnosis not present

## 2014-07-21 DIAGNOSIS — N183 Chronic kidney disease, stage 3 (moderate): Secondary | ICD-10-CM | POA: Diagnosis not present

## 2014-07-26 DIAGNOSIS — M545 Low back pain: Secondary | ICD-10-CM | POA: Diagnosis not present

## 2014-07-26 DIAGNOSIS — Z79899 Other long term (current) drug therapy: Secondary | ICD-10-CM | POA: Diagnosis not present

## 2014-07-26 DIAGNOSIS — M797 Fibromyalgia: Secondary | ICD-10-CM | POA: Diagnosis not present

## 2014-07-26 DIAGNOSIS — M2550 Pain in unspecified joint: Secondary | ICD-10-CM | POA: Diagnosis not present

## 2014-08-02 ENCOUNTER — Ambulatory Visit (INDEPENDENT_AMBULATORY_CARE_PROVIDER_SITE_OTHER): Payer: Medicare Other | Admitting: *Deleted

## 2014-08-02 DIAGNOSIS — Z5181 Encounter for therapeutic drug level monitoring: Secondary | ICD-10-CM

## 2014-08-02 DIAGNOSIS — Z7901 Long term (current) use of anticoagulants: Secondary | ICD-10-CM | POA: Diagnosis not present

## 2014-08-02 DIAGNOSIS — I4891 Unspecified atrial fibrillation: Secondary | ICD-10-CM

## 2014-08-02 DIAGNOSIS — I482 Chronic atrial fibrillation: Secondary | ICD-10-CM

## 2014-08-02 DIAGNOSIS — I4821 Permanent atrial fibrillation: Secondary | ICD-10-CM

## 2014-08-02 LAB — POCT INR: INR: 3.6

## 2014-08-03 DIAGNOSIS — N183 Chronic kidney disease, stage 3 (moderate): Secondary | ICD-10-CM | POA: Diagnosis not present

## 2014-08-03 DIAGNOSIS — K219 Gastro-esophageal reflux disease without esophagitis: Secondary | ICD-10-CM | POA: Diagnosis not present

## 2014-08-03 DIAGNOSIS — E1122 Type 2 diabetes mellitus with diabetic chronic kidney disease: Secondary | ICD-10-CM | POA: Diagnosis not present

## 2014-08-03 DIAGNOSIS — E785 Hyperlipidemia, unspecified: Secondary | ICD-10-CM | POA: Diagnosis not present

## 2014-08-03 DIAGNOSIS — E119 Type 2 diabetes mellitus without complications: Secondary | ICD-10-CM | POA: Diagnosis not present

## 2014-08-03 DIAGNOSIS — I10 Essential (primary) hypertension: Secondary | ICD-10-CM | POA: Diagnosis not present

## 2014-08-16 ENCOUNTER — Encounter: Payer: Self-pay | Admitting: Cardiology

## 2014-08-16 ENCOUNTER — Ambulatory Visit (INDEPENDENT_AMBULATORY_CARE_PROVIDER_SITE_OTHER): Payer: Medicare Other | Admitting: Cardiology

## 2014-08-16 ENCOUNTER — Ambulatory Visit (INDEPENDENT_AMBULATORY_CARE_PROVIDER_SITE_OTHER): Payer: Medicare Other | Admitting: *Deleted

## 2014-08-16 VITALS — BP 102/68 | HR 55 | Ht 63.0 in | Wt 200.0 lb

## 2014-08-16 DIAGNOSIS — I4891 Unspecified atrial fibrillation: Secondary | ICD-10-CM

## 2014-08-16 DIAGNOSIS — I4821 Permanent atrial fibrillation: Secondary | ICD-10-CM

## 2014-08-16 DIAGNOSIS — Z5181 Encounter for therapeutic drug level monitoring: Secondary | ICD-10-CM

## 2014-08-16 DIAGNOSIS — I10 Essential (primary) hypertension: Secondary | ICD-10-CM | POA: Diagnosis not present

## 2014-08-16 DIAGNOSIS — I482 Chronic atrial fibrillation: Secondary | ICD-10-CM

## 2014-08-16 DIAGNOSIS — Z7901 Long term (current) use of anticoagulants: Secondary | ICD-10-CM | POA: Diagnosis not present

## 2014-08-16 LAB — POCT INR: INR: 2.3

## 2014-08-16 MED ORDER — METOPROLOL TARTRATE 25 MG PO TABS: 25.0000 mg | ORAL_TABLET | Freq: Two times a day (BID) | ORAL | Status: DC

## 2014-08-16 NOTE — Progress Notes (Signed)
Cardiology Office Note  Date: 08/16/2014   ID: [name], DOB 09/21/1935, MRN [id number]  PCP: Delphina Cahill, MD  Primary Cardiologist: [name], MD   Chief Complaint  Patient presents with  . Atrial Fibrillation    History of Present Illness: [name] is a 80 y.o. female last seen in November 2015.  She presents for a routine follow-up visit. We reviewed her medications, she reports compliance. She has had no chest pain or palpitations, but does describe some intermittent episodes of dizziness and weakness. She states that her heart rate was "43" when she saw Dr. [name] recently.  She continues to follow in the anticoagulation clinic on Coumadin.  No reported bleeding episodes on Coumadin. Dose has been adjusted with therapeutic INR noted today.  Follow-up ECG is outlined below.  Past Medical History  Diagnosis Date  . Diarrhea   . Esophageal reflux   . OSA (obstructive sleep apnea)   . Fibromyalgia   . Nephrolithiasis   . Anxiety   . Mixed hyperlipidemia   . Essential hypertension, benign   . Colonoscopy causing post-procedural bleeding     Incomplete. by Dr. [name] 06/11/99 due to fixed non-compliant colon precluded exam to 40 cm, she had subsequent colectomy for diverticulitis since that time.   . Chronic back pain   . Hiatal hernia     Recent EGD/TCS showed small hiatal hernia, normal apperaring tubular esophagus. s/p passage of a 54-french Maloney dilator, s/p biopsy esophageal mucosa, multiple fundal gland type gastric polyps. one large pedunculated polp with oozing, noted s/p clipping and snare polypectomy. Biopsies of the gastric mucosa taken given her symptoms in her elevated count. normal D1-D3, s/p biospy D2-D3.   [name] Hiatal hernia     all bx unremarkable. on TCS she had left-sided diverticula, evidence of prior segmental resection with anastomosis, multiple colonic polyps s/p multiple snare polypectomies, s/p segmental biopsy and stool sampling. she  had multiple tubular adenomas. no microscopic/collagenous colitis. stool culture, c diff, O+P, lactoferrin were negative.   . Ureteral stent retained     Not retained. ureteral stent removal gross hematuria resolved 10/11. coumadin restarted.   . Type 2 diabetes mellitus   . Permanent atrial fibrillation     Previous failed DCCV  . Asthma   . Ventral hernia     Current Outpatient Prescriptions  Medication Sig Dispense Refill  . acetaminophen (TYLENOL) 325 MG tablet Take 650 mg by mouth every 6 (six) hours as needed for pain.     [name] albuterol (PROAIR HFA) 108 (90 BASE) MCG/ACT inhaler Inhale 2 puffs into the lungs every 4 (four) hours as needed. Shortness of Breath    . ALPRAZolam (XANAX) 1 MG tablet Take 0.5 mg by mouth 2 (two) times daily as needed for anxiety.     [name] BYETTA 10 MCG PEN 10 MCG/0.04ML SOLN Inject 10 mcg into the skin Twice daily.    . cloNIDine (CATAPRES - DOSED IN MG/24 HR) 0.1 mg/24hr patch APPLY 1 PATCH ONCE A WEEK AS DIRECTED 4 patch 6  . dicyclomine (BENTYL) 10 MG capsule Take 1 capsule (10 mg total) by mouth 4 (four) times daily -  before meals and at bedtime. As needed for abdominal pain and diarrhea. 120 capsule 1  . hydrochlorothiazide 50 MG tablet Take 50 mg by mouth daily.     . insulin glargine (LANTUS) 100 UNIT/ML injection Inject 36 Units into the skin at bedtime.     . insulin lispro (HUMALOG) 100  UNIT/ML injection Inject 6 Units into the skin 3 (three) times daily.     [name] lidocaine (LIDODERM) 5 % Place 1 patch onto the skin daily as needed (Pain). Remove & Discard patch within 12 hours or as directed by MD    . Liraglutide 18 MG/3ML SOPN Inject 1.8 mg into the skin.    . metoprolol (LOPRESSOR) 50 MG tablet Take 50 mg by mouth 2 (two) times daily.     . ondansetron (ZOFRAN-ODT) 4 MG disintegrating tablet Take 4 mg by mouth every 8 (eight) hours as needed. Nausea and Vomiting    . oxyCODONE (ROXICODONE) 15 MG immediate release tablet Take 15 mg by mouth every 6  (six) hours as needed. Pain    . pantoprazole (PROTONIX) 40 MG tablet Take 1 tablet (40 mg total) by mouth 2 (two) times daily before a meal. 60 tablet 5  . simvastatin (ZOCOR) 20 MG tablet Take 1 tablet by mouth daily.    . tizanidine (ZANAFLEX) 2 MG capsule Take 2 mg by mouth 3 (three) times daily as needed for muscle spasms.     [name] warfarin (COUMADIN) 5 MG tablet Take 1 tablet daily except 1/2 tablet on Sundays, Tuesdays and Thursdays (Patient taking differently: Take 1/2 tablet daily except 1 tablet on Mondays and Fridays) 30 tablet 5   No current facility-administered medications for this visit.    Allergies:  Adhesive; Demerol; Iohexol; Meperidine hcl; Shellfish allergy; and Penicillins   Social History: The patient  reports that she has never smoked. She has never used smokeless tobacco. She reports that she does not drink alcohol or use illicit drugs.   ROS:  Please see the history of present illness. Otherwise, complete review of systems is positive for none.  All other systems are reviewed and negative.   Physical Exam: VS:  BP 102/68 mmHg  Pulse 55  Ht 5\' 3"  (1.6 m)  Wt 200 lb (90.719 kg)  BMI 35.44 kg/m2  SpO2 99%, BMI Body mass index is 35.44 kg/(m^2).  Wt Readings from Last 3 Encounters:  08/16/14 200 lb (90.719 kg)  06/22/14 205 lb 3.2 oz (93.078 kg)  05/16/14 210 lb (95.255 kg)     Overweight woman in no acute distress.  HEENT: Conjunctiva and lids normal, oropharynx clear.  Neck: Supple, no elevated JVP or carotid bruits, no thyromegaly.  Lungs: Clear to auscultation, nonlabored breathing at rest.  Cardiac: Irregularly irregular, no S3 or significant systolic murmur.  Abdomen: Soft, nontender,bowel sounds present.  Extremities: Trace edema, distal pulses 2+.    ECG: ECG is ordered today and reviewed showing atrial fibrillation at 62 low voltage.   Recent Labwork: 02/28/2014: ALT 15; AST 21; BUN 26*; Creatinine 1.36*; Hemoglobin 15.3*; Platelets 262;  Potassium 5.4*; Sodium 140   Assessment and Plan:  1. Chronic atrial fibrillation. She has had bradycardia noted recently with intermittent dizziness. Reduce Lopressor to 25 mg twice daily, otherwise continue Coumadin with follow-up in the anticoagulation clinic.  2. Essential hypertension , blood pressure is normal today.  Current medicines were reviewed with the patient today.   Orders Placed This Encounter  Procedures  . EKG 12-Lead    Disposition: FU with me in 6 months.   Signed, [name], MD, [facility] 08/16/2014 9:38 AM    [facility] [place], [place], [place] [address] Phone: [phone]; Fax: [phone]

## 2014-08-16 NOTE — Patient Instructions (Signed)
   Decrease Metoprolol to 25mg  twice a day  - may take 1/2 tab of your 50mg  tablet twice a day till finish current supply.  New prescription sent to pharmacy today.  Continue all other medications.   Your physician wants you to follow up in: 6 months.  You will receive a reminder letter in the mail one-two months in advance.  If you don't receive a letter, please call our office to schedule the follow up appointment

## 2014-08-24 ENCOUNTER — Other Ambulatory Visit: Payer: Self-pay | Admitting: Cardiology

## 2014-08-25 ENCOUNTER — Other Ambulatory Visit: Payer: Self-pay | Admitting: *Deleted

## 2014-08-25 MED ORDER — WARFARIN SODIUM 5 MG PO TABS: ORAL_TABLET | ORAL | Status: DC

## 2014-08-25 NOTE — Telephone Encounter (Signed)
The Rx is attached to the wrong Pt chart.  Rx sent to [facility] on correct pt chart.

## 2014-09-12 DIAGNOSIS — I10 Essential (primary) hypertension: Secondary | ICD-10-CM | POA: Diagnosis not present

## 2014-09-12 DIAGNOSIS — E1165 Type 2 diabetes mellitus with hyperglycemia: Secondary | ICD-10-CM | POA: Diagnosis not present

## 2014-09-12 DIAGNOSIS — Z713 Dietary counseling and surveillance: Secondary | ICD-10-CM | POA: Diagnosis not present

## 2014-09-12 DIAGNOSIS — E785 Hyperlipidemia, unspecified: Secondary | ICD-10-CM | POA: Diagnosis not present

## 2014-09-13 ENCOUNTER — Ambulatory Visit (INDEPENDENT_AMBULATORY_CARE_PROVIDER_SITE_OTHER): Payer: Medicare Other | Admitting: *Deleted

## 2014-09-13 DIAGNOSIS — Z5181 Encounter for therapeutic drug level monitoring: Secondary | ICD-10-CM

## 2014-09-13 DIAGNOSIS — I482 Chronic atrial fibrillation: Secondary | ICD-10-CM | POA: Diagnosis not present

## 2014-09-13 DIAGNOSIS — Z7901 Long term (current) use of anticoagulants: Secondary | ICD-10-CM

## 2014-09-13 DIAGNOSIS — I4821 Permanent atrial fibrillation: Secondary | ICD-10-CM

## 2014-09-13 DIAGNOSIS — I4891 Unspecified atrial fibrillation: Secondary | ICD-10-CM

## 2014-09-13 LAB — POCT INR: INR: 1.8

## 2014-09-19 DIAGNOSIS — E6609 Other obesity due to excess calories: Secondary | ICD-10-CM | POA: Diagnosis not present

## 2014-09-19 DIAGNOSIS — E785 Hyperlipidemia, unspecified: Secondary | ICD-10-CM | POA: Diagnosis not present

## 2014-09-19 DIAGNOSIS — I10 Essential (primary) hypertension: Secondary | ICD-10-CM | POA: Diagnosis not present

## 2014-09-19 DIAGNOSIS — E1165 Type 2 diabetes mellitus with hyperglycemia: Secondary | ICD-10-CM | POA: Diagnosis not present

## 2014-09-20 DIAGNOSIS — M25511 Pain in right shoulder: Secondary | ICD-10-CM | POA: Diagnosis not present

## 2014-09-20 DIAGNOSIS — M2550 Pain in unspecified joint: Secondary | ICD-10-CM | POA: Diagnosis not present

## 2014-09-20 DIAGNOSIS — M797 Fibromyalgia: Secondary | ICD-10-CM | POA: Diagnosis not present

## 2014-09-20 DIAGNOSIS — M545 Low back pain: Secondary | ICD-10-CM | POA: Diagnosis not present

## 2014-10-03 ENCOUNTER — Encounter: Payer: Self-pay | Admitting: Nutrition

## 2014-10-03 ENCOUNTER — Encounter: Payer: Medicare Other | Attending: "Endocrinology | Admitting: Nutrition

## 2014-10-03 VITALS — Ht 63.0 in | Wt 199.2 lb

## 2014-10-03 DIAGNOSIS — E118 Type 2 diabetes mellitus with unspecified complications: Secondary | ICD-10-CM | POA: Diagnosis not present

## 2014-10-03 DIAGNOSIS — Z713 Dietary counseling and surveillance: Secondary | ICD-10-CM | POA: Insufficient documentation

## 2014-10-03 DIAGNOSIS — N183 Chronic kidney disease, stage 3 unspecified: Secondary | ICD-10-CM

## 2014-10-03 DIAGNOSIS — E669 Obesity, unspecified: Secondary | ICD-10-CM | POA: Diagnosis not present

## 2014-10-03 DIAGNOSIS — Z6835 Body mass index (BMI) 35.0-35.9, adult: Secondary | ICD-10-CM | POA: Insufficient documentation

## 2014-10-03 DIAGNOSIS — Z794 Long term (current) use of insulin: Secondary | ICD-10-CM | POA: Diagnosis not present

## 2014-10-03 NOTE — Progress Notes (Signed)
  Medical Nutrition Therapy:  Appt start time: 1100 end time:   1115.  Assessment:  Primary concerns today: DIabetes. Most recent A1C 7.3% in June down from 7.6%. FBS look great 80-90's and evening BS 150-180's. Has CKD and sees Dr. Hinda Lenis   Changes made:: Lost 6 lbs. Watching her food choices better and avoiding snacks. 30 units of lantus -reduced from 36  And takes 5 units of Humalog with sliding scale with meals unless BS is less than 90 mg/dl. N0 recent low blood sugars l  Feels good. Waking for exercise 5 days per week now.   Preferred Learning Style:   Auditory  Visual  Hands on   Learning Readiness:   Ready  Change in progress  MEDICATIONS: See list   DIETARY INTAKE:  24-hr recall:  B ( AM): 1 egg, 1 slice toast, 1 piece of fruit Snk ( AM):  L ( PM): Toss salad, meat, fruit and 4 unsalted crackers. Snk ( PM): none D ( PM):Chicken, toss salad, fruit, baked potato, squash Snk ( PM): Beverages: water  Usual physical activity: walking most days a week but still has back problems. Estimated energy needs: 1500 calories 170 g carbohydrates 112 g protein 42 g fat  Progress Towards Goal(s):  In progress.   Nutritional Diagnosis:  NB-1.1 Food and nutrition-related knowledge deficit As related to Diabetes.  As evidenced by A1C 8.1%.    Intervention:  Nutrition counseling and diabetes education on diet,meal planning, CHO counting, portion sizes, signs/symptoms of hyper/hypoglycemia and treatment, prevention of complications and benefits of exercise for Dm and weight loss.  Goals: Keep up the good work!!  Follow MY Plate as instructed.  Eat 3 meals a day  Walk 30 minutes 5 days.  Get A1C to 6.5% in three months.  Continue testing blood sugars and taking insulin as prescribed.  Lose 2 lbs per month.  Try putting light yogurt in the freezer for "ice cream" dessert  In place of desires for ice cream..  Follow guidelines given for Chronic Kidney Disease and  discuss with Dr. Hinda Lenis.  Teaching Method Utilized: See list Visual Auditory Hands on  Handouts given during visit include: Nutrition Guidelines for CKD My Plate  Barriers to learning/adherence to lifestyle change: none  Demonstrated degree of understanding via:  Teach Back   Monitoring/Evaluation:  Dietary intake, exercise, meal planning SBG, and body weight in 6 month(s).

## 2014-10-03 NOTE — Patient Instructions (Signed)
Goals: Keep up the good work!!  Follow MY Plate as instructed.  Eat 3 meals a day  Avoid snacks between meals.  Walk 30 minutes 5 days.  Add whole grains for increased fiber.  Get A1C to 6.5% in three months.  Test blood sugars and take insulin as prescribed.  Lose 2 lbs per month.  Try putting light yogurt in the freezer for "ice cream" dessert.

## 2014-10-06 ENCOUNTER — Ambulatory Visit (INDEPENDENT_AMBULATORY_CARE_PROVIDER_SITE_OTHER): Payer: Medicare Other | Admitting: *Deleted

## 2014-10-06 DIAGNOSIS — Z5181 Encounter for therapeutic drug level monitoring: Secondary | ICD-10-CM

## 2014-10-06 DIAGNOSIS — I482 Chronic atrial fibrillation: Secondary | ICD-10-CM

## 2014-10-06 DIAGNOSIS — Z7901 Long term (current) use of anticoagulants: Secondary | ICD-10-CM

## 2014-10-06 DIAGNOSIS — I4891 Unspecified atrial fibrillation: Secondary | ICD-10-CM

## 2014-10-06 DIAGNOSIS — I4821 Permanent atrial fibrillation: Secondary | ICD-10-CM

## 2014-10-06 LAB — POCT INR: INR: 2.4

## 2014-11-01 DIAGNOSIS — I129 Hypertensive chronic kidney disease with stage 1 through stage 4 chronic kidney disease, or unspecified chronic kidney disease: Secondary | ICD-10-CM | POA: Diagnosis not present

## 2014-11-01 DIAGNOSIS — N183 Chronic kidney disease, stage 3 (moderate): Secondary | ICD-10-CM | POA: Diagnosis not present

## 2014-11-01 DIAGNOSIS — E559 Vitamin D deficiency, unspecified: Secondary | ICD-10-CM | POA: Diagnosis not present

## 2014-11-01 DIAGNOSIS — R809 Proteinuria, unspecified: Secondary | ICD-10-CM | POA: Diagnosis not present

## 2014-11-01 DIAGNOSIS — Z79899 Other long term (current) drug therapy: Secondary | ICD-10-CM | POA: Diagnosis not present

## 2014-11-01 DIAGNOSIS — D509 Iron deficiency anemia, unspecified: Secondary | ICD-10-CM | POA: Diagnosis not present

## 2014-11-08 ENCOUNTER — Ambulatory Visit (INDEPENDENT_AMBULATORY_CARE_PROVIDER_SITE_OTHER): Payer: Medicare Other | Admitting: *Deleted

## 2014-11-08 DIAGNOSIS — N183 Chronic kidney disease, stage 3 (moderate): Secondary | ICD-10-CM | POA: Diagnosis not present

## 2014-11-08 DIAGNOSIS — I4821 Permanent atrial fibrillation: Secondary | ICD-10-CM

## 2014-11-08 DIAGNOSIS — E1129 Type 2 diabetes mellitus with other diabetic kidney complication: Secondary | ICD-10-CM | POA: Diagnosis not present

## 2014-11-08 DIAGNOSIS — Z7901 Long term (current) use of anticoagulants: Secondary | ICD-10-CM | POA: Diagnosis not present

## 2014-11-08 DIAGNOSIS — I482 Chronic atrial fibrillation: Secondary | ICD-10-CM | POA: Diagnosis not present

## 2014-11-08 DIAGNOSIS — I4891 Unspecified atrial fibrillation: Secondary | ICD-10-CM | POA: Diagnosis not present

## 2014-11-08 DIAGNOSIS — Z5181 Encounter for therapeutic drug level monitoring: Secondary | ICD-10-CM | POA: Diagnosis not present

## 2014-11-08 DIAGNOSIS — K589 Irritable bowel syndrome without diarrhea: Secondary | ICD-10-CM | POA: Diagnosis not present

## 2014-11-08 DIAGNOSIS — I10 Essential (primary) hypertension: Secondary | ICD-10-CM | POA: Diagnosis not present

## 2014-11-08 LAB — POCT INR: INR: 1.7

## 2014-11-14 ENCOUNTER — Ambulatory Visit: Payer: Medicare Other | Admitting: Gastroenterology

## 2014-11-14 ENCOUNTER — Telehealth: Payer: Self-pay | Admitting: Internal Medicine

## 2014-11-14 NOTE — Telephone Encounter (Signed)
PATIENT WAS A NO SHOW AND LETTER SENT  °

## 2014-11-16 ENCOUNTER — Other Ambulatory Visit: Payer: Self-pay

## 2014-11-17 ENCOUNTER — Other Ambulatory Visit: Payer: Self-pay

## 2014-11-17 DIAGNOSIS — M545 Low back pain: Secondary | ICD-10-CM | POA: Diagnosis not present

## 2014-11-17 DIAGNOSIS — M25511 Pain in right shoulder: Secondary | ICD-10-CM | POA: Diagnosis not present

## 2014-11-17 DIAGNOSIS — M797 Fibromyalgia: Secondary | ICD-10-CM | POA: Diagnosis not present

## 2014-11-17 DIAGNOSIS — M2550 Pain in unspecified joint: Secondary | ICD-10-CM | POA: Diagnosis not present

## 2014-11-17 MED ORDER — PANTOPRAZOLE SODIUM 40 MG PO TBEC: 40.0000 mg | DELAYED_RELEASE_TABLET | Freq: Two times a day (BID) | ORAL | Status: DC

## 2014-11-29 ENCOUNTER — Other Ambulatory Visit: Payer: Self-pay

## 2014-11-29 ENCOUNTER — Ambulatory Visit (INDEPENDENT_AMBULATORY_CARE_PROVIDER_SITE_OTHER): Payer: Medicare Other | Admitting: *Deleted

## 2014-11-29 ENCOUNTER — Ambulatory Visit (INDEPENDENT_AMBULATORY_CARE_PROVIDER_SITE_OTHER): Payer: Medicare Other | Admitting: Gastroenterology

## 2014-11-29 ENCOUNTER — Encounter: Payer: Self-pay | Admitting: Gastroenterology

## 2014-11-29 VITALS — BP 116/65 | HR 62 | Temp 97.3°F | Ht 63.0 in | Wt 201.8 lb

## 2014-11-29 DIAGNOSIS — R1314 Dysphagia, pharyngoesophageal phase: Secondary | ICD-10-CM | POA: Diagnosis not present

## 2014-11-29 DIAGNOSIS — R1310 Dysphagia, unspecified: Secondary | ICD-10-CM

## 2014-11-29 DIAGNOSIS — D369 Benign neoplasm, unspecified site: Secondary | ICD-10-CM | POA: Diagnosis not present

## 2014-11-29 DIAGNOSIS — K219 Gastro-esophageal reflux disease without esophagitis: Secondary | ICD-10-CM | POA: Diagnosis not present

## 2014-11-29 DIAGNOSIS — Z5181 Encounter for therapeutic drug level monitoring: Secondary | ICD-10-CM | POA: Diagnosis not present

## 2014-11-29 DIAGNOSIS — I4891 Unspecified atrial fibrillation: Secondary | ICD-10-CM

## 2014-11-29 DIAGNOSIS — I4821 Permanent atrial fibrillation: Secondary | ICD-10-CM

## 2014-11-29 DIAGNOSIS — Z7901 Long term (current) use of anticoagulants: Secondary | ICD-10-CM

## 2014-11-29 DIAGNOSIS — K589 Irritable bowel syndrome without diarrhea: Secondary | ICD-10-CM | POA: Diagnosis not present

## 2014-11-29 DIAGNOSIS — I482 Chronic atrial fibrillation: Secondary | ICD-10-CM | POA: Diagnosis not present

## 2014-11-29 DIAGNOSIS — R1319 Other dysphagia: Secondary | ICD-10-CM

## 2014-11-29 DIAGNOSIS — R197 Diarrhea, unspecified: Secondary | ICD-10-CM

## 2014-11-29 DIAGNOSIS — Z86010 Personal history of colonic polyps: Secondary | ICD-10-CM

## 2014-11-29 LAB — POCT INR: INR: 1.6

## 2014-11-29 MED ORDER — PEG 3350-KCL-NA BICARB-NACL 420 G PO SOLR: 4000.0000 mL | Freq: Once | ORAL | Status: DC

## 2014-11-29 NOTE — Patient Instructions (Signed)
1. Colonoscopy and upper endoscopy with Dr. [name]. See separate instructions.  2. Please let coumadin clinic know that we are holding your coumadin for four days prior to your procedures.

## 2014-11-29 NOTE — Progress Notes (Signed)
Primary Care Physician:  Delphina Cahill, MD  Primary Gastroenterologist:  Garfield Cornea, MD   Chief Complaint  Patient presents with  . Colonoscopy    HPI:  [name] is a 80 y.o. female here to schedule her colonoscopy. She was last seen in 05/2014. She has h/o diarrhea, abdominal pain, IBS, GERD, complicated diverticulitis requiring partial colectomy in the past. Last TCS 12/2011, she had multiple tubular adenomas removed and recommended to have next surveillance TCS in 12/2014. Her prep was "suboptimal". Patient previously evaluated for microscopic colitis in 2010. Celiac screen negative in 2015.   Diarrhea 5-6 times per day, during the night at times. Takes bentyl only as needed, doesn't take often because causes bad constipation. No brbpr, melena. Alternates between constipation/diarrhea. Feels knot if mid center abdomen prior to diarrhea. Massages it and then diarrhea starts. Noticed knot for 3-4 months. More tendency towards diarrhea. Up to 10 times per day at times. With constipation, may skip four days and will have to take something to start it. No recent antibiotics.   Heartburn poorly controlled. Nexium was much better than pantoprazole. Regurgitates often. Has trouble swallowing rice, mashed potatos. Causes hiccups when occurs. Uses water to wash down. Difficulty with certain large pills. No odynophagia.   Pain medications cut back. 110 per month from 120.   Lost 55 pounds with Dr. Dorris Fetch.     Current Outpatient Prescriptions  Medication Sig Dispense Refill  . acetaminophen (TYLENOL) 325 MG tablet Take 650 mg by mouth every 6 (six) hours as needed for pain.     [name] ALPRAZolam (XANAX) 1 MG tablet Take 0.5 mg by mouth 2 (two) times daily as needed for anxiety.     . cloNIDine (CATAPRES - DOSED IN MG/24 HR) 0.1 mg/24hr patch APPLY 1 PATCH ONCE A WEEK AS DIRECTED 4 patch 6  . dicyclomine (BENTYL) 10 MG capsule Take 1 capsule (10 mg total) by mouth 4 (four) times daily -  before meals  and at bedtime. As needed for abdominal pain and diarrhea. 120 capsule 1  . hydrochlorothiazide 50 MG tablet Take 50 mg by mouth daily.     . insulin glargine (LANTUS) 100 UNIT/ML injection Inject 30 Units into the skin at bedtime.     . insulin lispro (HUMALOG) 100 UNIT/ML injection Inject 6 Units into the skin 3 (three) times daily. Prn per patient    . lidocaine (LIDODERM) 5 % Place 1 patch onto the skin daily as needed (Pain). Remove & Discard patch within 12 hours or as directed by MD    . Liraglutide 18 MG/3ML SOPN Inject 1.8 mg into the skin daily.     . metoprolol (LOPRESSOR) 25 MG tablet Take 1 tablet (25 mg total) by mouth 2 (two) times daily. 60 tablet 6  . ondansetron (ZOFRAN-ODT) 4 MG disintegrating tablet Take 4 mg by mouth every 8 (eight) hours as needed. Nausea and Vomiting    . oxyCODONE (ROXICODONE) 15 MG immediate release tablet Take 15 mg by mouth every 6 (six) hours as needed. Pain    . pantoprazole (PROTONIX) 40 MG tablet Take 1 tablet (40 mg total) by mouth 2 (two) times daily before a meal. 60 tablet 5  . simvastatin (ZOCOR) 20 MG tablet Take 1 tablet by mouth daily.    . tizanidine (ZANAFLEX) 2 MG capsule Take 2 mg by mouth 3 (three) times daily as needed for muscle spasms.     [name] warfarin (COUMADIN) 5 MG tablet Take 1/2 tablet daily except  1 tablet on Mondays and Fridays (Patient taking differently: Take 1/2 tablet daily M,W,F and 1 tablet on other days.) 30 tablet 5   No current facility-administered medications for this visit.    Allergies as of 11/29/2014 - Review Complete 10/03/2014  Allergen Reaction Noted  . Adhesive [tape] Itching 11/29/2011  . Demerol [meperidine]  02/22/2014  . Iohexol Nausea And Vomiting 03/08/2004  . Meperidine hcl Nausea And Vomiting   . Shellfish allergy Diarrhea and Nausea And Vomiting 08/03/2012  . Penicillins Nausea And Vomiting and Rash     Past Medical History  Diagnosis Date  . Diarrhea   . Esophageal reflux   . OSA  (obstructive sleep apnea)   . Fibromyalgia   . Nephrolithiasis   . Anxiety   . Mixed hyperlipidemia   . Essential hypertension, benign   . Colonoscopy causing post-procedural bleeding     Incomplete. by Dr. [name] 06/11/99 due to fixed non-compliant colon precluded exam to 40 cm, she had subsequent colectomy for diverticulitis since that time.   . Chronic back pain   . Hiatal hernia     Recent EGD/TCS showed small hiatal hernia, normal apperaring tubular esophagus. s/p passage of a 54-french Maloney dilator, s/p biopsy esophageal mucosa, multiple fundal gland type gastric polyps. one large pedunculated polp with oozing, noted s/p clipping and snare polypectomy. Biopsies of the gastric mucosa taken given her symptoms in her elevated count. normal D1-D3, s/p biospy D2-D3.   [name] Hiatal hernia     all bx unremarkable. on TCS she had left-sided diverticula, evidence of prior segmental resection with anastomosis, multiple colonic polyps s/p multiple snare polypectomies, s/p segmental biopsy and stool sampling. she had multiple tubular adenomas. no microscopic/collagenous colitis. stool culture, c diff, O+P, lactoferrin were negative.   . Ureteral stent retained     Not retained. ureteral stent removal gross hematuria resolved 10/11. coumadin restarted.   . Type 2 diabetes mellitus   . Permanent atrial fibrillation     Previous failed DCCV  . Asthma   . Ventral hernia     Past Surgical History  Procedure Laterality Date  . Total abdominal hysterectomy    . Tonsillectomy    . Colectomy      2005. Dr. Geoffry Paradise for diverticulitis  . Esophagogastroduodenoscopy  02/22/09    normal s/p dilator;small HH/one large pedunculates polyp  s/p clipping, hyperplastic  . Colonoscopy  02/22/09    normal/left-sided diverticula/multiple colonic polyp, adenomatous  . Breast lumpectomy      benign cyst  . Colonoscopy  12/16/2011    colonic polyps-treated as described above. Status postprior sigmoid resection.  adenomatous. next TCS 12/2014  . Cataract extraction w/phaco Left 08/17/2012    Procedure: CATARACT EXTRACTION PHACO AND INTRAOCULAR LENS PLACEMENT (IOC);  Surgeon: [name], MD;  Location: [facility];  Service: Ophthalmology;  Laterality: Left;  CDE:18.73  . Esophagogastroduodenoscopy  12/16/2011    [name]: small hiatal hernia. Hyperplastic apperating polyps. Status post [name] dilation as described above.    Family History  Problem Relation Age of Onset  . Coronary artery disease      Family Hx   . Colon cancer Maternal Grandfather     Social History   Social History  . Marital Status: Widowed    Spouse Name: N/A  . Number of Children: N/A  . Years of Education: N/A   Occupational History  . Not on file.   Social History Main Topics  . Smoking status: Never Smoker   . Smokeless tobacco: Never  Used     Comment: tobacco use - no  . Alcohol Use: No  . Drug Use: No  . Sexual Activity: Not on file   Other Topics Concern  . Not on file   Social History Narrative      ROS:  General: Negative for anorexia, weight loss, fever, chills, fatigue, weakness. Eyes: Negative for vision changes.  ENT: Negative for hoarseness, difficulty swallowing , nasal congestion. CV: Negative for chest pain, angina, palpitations, dyspnea on exertion, peripheral edema.  Respiratory: Negative for dyspnea at rest, dyspnea on exertion, cough, sputum, wheezing.  GI: See history of present illness. GU:  Negative for dysuria, hematuria, urinary incontinence, urinary frequency, nocturnal urination.  MS: Negative for joint pain, low back pain.  Derm: Negative for rash or itching.  Neuro: Negative for weakness, abnormal sensation, seizure, frequent headaches, memory loss, confusion.  Psych: Negative for anxiety, depression, suicidal ideation, hallucinations.  Endo: Negative for unusual weight change.  Heme: Negative for bruising or bleeding. Allergy: Negative for rash or hives.    Physical  Examination:  BP 116/65 mmHg  Pulse 62  Temp(Src) 97.3 F (36.3 C) (Oral)  Ht 5\' 3"  (1.6 m)  Wt 201 lb 12.8 oz (91.536 kg)  BMI 35.76 kg/m2   General: Well-nourished, well-developed in no acute distress.  Head: Normocephalic, atraumatic.   Eyes: Conjunctiva pink, no icterus. Mouth: Oropharyngeal mucosa moist and pink , no lesions erythema or exudate. Neck: Supple without thyromegaly, masses, or lymphadenopathy.  Lungs: Clear to auscultation bilaterally.  Heart: Regular rate and rhythm, no murmurs rubs or gallops.  Abdomen: Bowel sounds are normal,mild tenderness with palpation mid-abd at site of rectus diastasis. Appears to be hernia involving this area too, most pronounces with sitting up and standing. Easily reducible. nondistended, no hepatosplenomegaly or masses, no abdominal bruits, no rebound or guarding.   Rectal: deferred Extremities: No lower extremity edema. No clubbing or deformities.  Neuro: Alert and oriented x 4 , grossly normal neurologically.  Skin: Warm and dry, no rash or jaundice.   Psych: Alert and cooperative, normal mood and affect.  Labs: Lab Results  Component Value Date   CREATININE 1.36* 02/28/2014   BUN 26* 02/28/2014   NA 140 02/28/2014   K 5.4* 02/28/2014   CL 104 02/28/2014   CO2 23 02/28/2014   Lab Results  Component Value Date   ALT 15 02/28/2014   AST 21 02/28/2014   ALKPHOS 87 02/28/2014   BILITOT 0.5 02/28/2014   Lab Results  Component Value Date   WBC 9.6 02/28/2014   HGB 15.3* 02/28/2014   HCT 44.1 02/28/2014   MCV 83.2 02/28/2014   PLT 262 02/28/2014     Imaging Studies: No results found.

## 2014-12-02 DIAGNOSIS — R1319 Other dysphagia: Secondary | ICD-10-CM | POA: Insufficient documentation

## 2014-12-02 DIAGNOSIS — R1310 Dysphagia, unspecified: Secondary | ICD-10-CM | POA: Insufficient documentation

## 2014-12-02 NOTE — Assessment & Plan Note (Signed)
Poorly controlled GERD and solid food esophageal dysphagia. Reports previously esophageal dilation helped. Offered EGD/ED at time of colonoscopy. Augment conscious sedation with phenergan 12.5mg  IV 30 minutes before procedure due to polypharmacy. Patient has allergy to demerol AND sleep apnea.  I have discussed the risks, alternatives, benefits with regards to but not limited to the risk of reaction to medication, bleeding, infection, perforation and the patient is agreeable to proceed. Written consent to be obtained.

## 2014-12-02 NOTE — Assessment & Plan Note (Addendum)
Extensive work up previously. Bentyl causes constipation. Patient appreciates worsening diarrhea when she develops at "knot" if her mid abd. Suspect issues related to abdominal wall hernia. Plan on colonoscopy for h/o polyps. Augment conscious sedation with phenergan 12.5mg  IV 30 minutes before for h/o polypharmacy.  I have discussed the risks, alternatives, benefits with regards to but not limited to the risk of reaction to medication, bleeding, infection, perforation and the patient is agreeable to proceed. Written consent to be obtained.  Consider random colon biopsies for diarrhea.   Hold coumadin four days before procedure. Patient will notify coumadin clinic.

## 2014-12-05 ENCOUNTER — Other Ambulatory Visit: Payer: Self-pay

## 2014-12-05 DIAGNOSIS — Z86010 Personal history of colonic polyps: Secondary | ICD-10-CM

## 2014-12-05 DIAGNOSIS — K219 Gastro-esophageal reflux disease without esophagitis: Secondary | ICD-10-CM

## 2014-12-06 DIAGNOSIS — I10 Essential (primary) hypertension: Secondary | ICD-10-CM | POA: Diagnosis not present

## 2014-12-06 DIAGNOSIS — E1122 Type 2 diabetes mellitus with diabetic chronic kidney disease: Secondary | ICD-10-CM | POA: Diagnosis not present

## 2014-12-06 DIAGNOSIS — E782 Mixed hyperlipidemia: Secondary | ICD-10-CM | POA: Diagnosis not present

## 2014-12-06 NOTE — Progress Notes (Signed)
cc'ed to pcp °

## 2014-12-08 DIAGNOSIS — I10 Essential (primary) hypertension: Secondary | ICD-10-CM | POA: Diagnosis not present

## 2014-12-08 DIAGNOSIS — E1122 Type 2 diabetes mellitus with diabetic chronic kidney disease: Secondary | ICD-10-CM | POA: Diagnosis not present

## 2014-12-08 DIAGNOSIS — E785 Hyperlipidemia, unspecified: Secondary | ICD-10-CM | POA: Diagnosis not present

## 2014-12-08 DIAGNOSIS — N183 Chronic kidney disease, stage 3 (moderate): Secondary | ICD-10-CM | POA: Diagnosis not present

## 2014-12-09 ENCOUNTER — Telehealth: Payer: Self-pay | Admitting: Gastroenterology

## 2014-12-09 NOTE — Telephone Encounter (Signed)
Can patient hold coumadin 4 days prior to upcoming EGD/colonoscopy on 12/28/14?

## 2014-12-13 ENCOUNTER — Ambulatory Visit (INDEPENDENT_AMBULATORY_CARE_PROVIDER_SITE_OTHER): Payer: Medicare Other | Admitting: *Deleted

## 2014-12-13 DIAGNOSIS — Z7901 Long term (current) use of anticoagulants: Secondary | ICD-10-CM | POA: Diagnosis not present

## 2014-12-13 DIAGNOSIS — I4891 Unspecified atrial fibrillation: Secondary | ICD-10-CM

## 2014-12-13 DIAGNOSIS — Z5181 Encounter for therapeutic drug level monitoring: Secondary | ICD-10-CM

## 2014-12-13 DIAGNOSIS — I482 Chronic atrial fibrillation: Secondary | ICD-10-CM

## 2014-12-13 DIAGNOSIS — I4821 Permanent atrial fibrillation: Secondary | ICD-10-CM

## 2014-12-13 LAB — POCT INR: INR: 2.5

## 2014-12-13 NOTE — Telephone Encounter (Signed)
OK to hold coumadin 4 days prior to procedure.  Resume coumadin night of procedure if stable.  Needs INR appt with me in 7 - 10 days post procedure. Thanks

## 2014-12-14 NOTE — Telephone Encounter (Signed)
Please notify patient that she will need INR appt with coumadin clinic 7-10 days after her procedures with [place].

## 2014-12-14 NOTE — Progress Notes (Signed)
See telephone note dated 12/09/14 regarding coumadin clinic.

## 2014-12-15 NOTE — Telephone Encounter (Signed)
Pt is aware and already has an appt.

## 2014-12-22 DIAGNOSIS — I10 Essential (primary) hypertension: Secondary | ICD-10-CM | POA: Diagnosis not present

## 2014-12-22 DIAGNOSIS — E1122 Type 2 diabetes mellitus with diabetic chronic kidney disease: Secondary | ICD-10-CM | POA: Diagnosis not present

## 2014-12-22 DIAGNOSIS — E782 Mixed hyperlipidemia: Secondary | ICD-10-CM | POA: Diagnosis not present

## 2014-12-28 ENCOUNTER — Encounter (HOSPITAL_COMMUNITY)
Admission: RE | Disposition: A | Discharge status: Home Health | Payer: Self-pay | Source: Ambulatory Visit | Attending: Internal Medicine

## 2014-12-28 ENCOUNTER — Encounter (HOSPITAL_COMMUNITY): Payer: Self-pay | Admitting: *Deleted

## 2014-12-28 ENCOUNTER — Ambulatory Visit (HOSPITAL_COMMUNITY)
Admission: RE | Admit: 2014-12-28 | Discharge: 2014-12-28 | Disposition: A | Discharge status: Home Health | Payer: Medicare Other | Source: Ambulatory Visit | Attending: Internal Medicine | Admitting: Internal Medicine

## 2014-12-28 DIAGNOSIS — R197 Diarrhea, unspecified: Secondary | ICD-10-CM | POA: Insufficient documentation

## 2014-12-28 DIAGNOSIS — R1310 Dysphagia, unspecified: Secondary | ICD-10-CM | POA: Insufficient documentation

## 2014-12-28 DIAGNOSIS — K3189 Other diseases of stomach and duodenum: Secondary | ICD-10-CM | POA: Diagnosis not present

## 2014-12-28 DIAGNOSIS — Z9049 Acquired absence of other specified parts of digestive tract: Secondary | ICD-10-CM | POA: Insufficient documentation

## 2014-12-28 DIAGNOSIS — K219 Gastro-esophageal reflux disease without esophagitis: Secondary | ICD-10-CM | POA: Diagnosis not present

## 2014-12-28 DIAGNOSIS — K2950 Unspecified chronic gastritis without bleeding: Secondary | ICD-10-CM | POA: Insufficient documentation

## 2014-12-28 DIAGNOSIS — K449 Diaphragmatic hernia without obstruction or gangrene: Secondary | ICD-10-CM | POA: Insufficient documentation

## 2014-12-28 DIAGNOSIS — K5730 Diverticulosis of large intestine without perforation or abscess without bleeding: Secondary | ICD-10-CM | POA: Insufficient documentation

## 2014-12-28 DIAGNOSIS — Z8719 Personal history of other diseases of the digestive system: Secondary | ICD-10-CM | POA: Diagnosis not present

## 2014-12-28 DIAGNOSIS — R1314 Dysphagia, pharyngoesophageal phase: Secondary | ICD-10-CM | POA: Diagnosis not present

## 2014-12-28 DIAGNOSIS — Z86010 Personal history of colonic polyps: Secondary | ICD-10-CM | POA: Insufficient documentation

## 2014-12-28 HISTORY — PX: COLONOSCOPY: SHX5424

## 2014-12-28 HISTORY — PX: ESOPHAGOGASTRODUODENOSCOPY: SHX5428

## 2014-12-28 HISTORY — PX: ESOPHAGEAL DILATION: SHX303

## 2014-12-28 LAB — GLUCOSE, CAPILLARY: Glucose-Capillary: 134 mg/dL — ABNORMAL HIGH (ref 65–99)

## 2014-12-28 SURGERY — COLONOSCOPY
Anesthesia: Moderate Sedation

## 2014-12-28 MED ORDER — LIDOCAINE VISCOUS 2 % MT SOLN
OROMUCOSAL | Status: AC
  Filled 2014-12-28: qty 15

## 2014-12-28 MED ORDER — PROMETHAZINE HCL 25 MG/ML IJ SOLN
INTRAMUSCULAR | Status: AC
  Filled 2014-12-28: qty 1

## 2014-12-28 MED ORDER — MEPERIDINE HCL 100 MG/ML IJ SOLN
INTRAMUSCULAR | Status: AC
  Filled 2014-12-28: qty 2

## 2014-12-28 MED ORDER — MIDAZOLAM HCL 5 MG/5ML IJ SOLN
INTRAMUSCULAR | Status: DC | PRN
  Administered 2014-12-28 (×4): 1 mg via INTRAVENOUS
  Administered 2014-12-28: 2 mg via INTRAVENOUS

## 2014-12-28 MED ORDER — SIMETHICONE 40 MG/0.6ML PO SUSP
ORAL | Status: DC | PRN
  Administered 2014-12-28: 12:00:00

## 2014-12-28 MED ORDER — MIDAZOLAM HCL 5 MG/5ML IJ SOLN
INTRAMUSCULAR | Status: AC
  Filled 2014-12-28: qty 10

## 2014-12-28 MED ORDER — PROMETHAZINE HCL 25 MG/ML IJ SOLN
12.5000 mg | Freq: Once | INTRAMUSCULAR | Status: AC
  Administered 2014-12-28: 12.5 mg via INTRAVENOUS

## 2014-12-28 MED ORDER — ONDANSETRON HCL 4 MG/2ML IJ SOLN
INTRAMUSCULAR | Status: DC | PRN
  Administered 2014-12-28: 4 mg via INTRAVENOUS

## 2014-12-28 MED ORDER — FENTANYL CITRATE (PF) 100 MCG/2ML IJ SOLN
INTRAMUSCULAR | Status: AC
  Filled 2014-12-28: qty 4

## 2014-12-28 MED ORDER — SODIUM CHLORIDE 0.9 % IV SOLN
INTRAVENOUS | Status: DC
  Administered 2014-12-28: 1000 mL via INTRAVENOUS

## 2014-12-28 MED ORDER — LIDOCAINE VISCOUS 2 % MT SOLN
OROMUCOSAL | Status: DC | PRN
  Administered 2014-12-28: 3 mL via OROMUCOSAL

## 2014-12-28 MED ORDER — ONDANSETRON HCL 4 MG/2ML IJ SOLN
INTRAMUSCULAR | Status: AC
  Filled 2014-12-28: qty 2

## 2014-12-28 MED ORDER — FENTANYL CITRATE (PF) 100 MCG/2ML IJ SOLN
INTRAMUSCULAR | Status: DC | PRN
  Administered 2014-12-28: 50 ug via INTRAVENOUS
  Administered 2014-12-28: 25 ug via INTRAVENOUS

## 2014-12-28 NOTE — Discharge Instructions (Signed)
Colonoscopy Discharge Instructions  Read the instructions outlined below and refer to this sheet in the next few weeks. These discharge instructions provide you with general information on caring for yourself after you leave the hospital. Your doctor may also give you specific instructions. While your treatment has been planned according to the most current medical practices available, unavoidable complications occasionally occur. If you have any problems or questions after discharge, call Dr. [name] at [phone]. ACTIVITY  You may resume your regular activity, but move at a slower pace for the next 24 hours.   Take frequent rest periods for the next 24 hours.   Walking will help get rid of the air and reduce the bloated feeling in your belly (abdomen).   No driving for 24 hours (because of the medicine (anesthesia) used during the test).    Do not sign any important legal documents or operate any machinery for 24 hours (because of the anesthesia used during the test).  NUTRITION  Drink plenty of fluids.   You may resume your normal diet as instructed by your doctor.   Begin with a light meal and progress to your normal diet. Heavy or fried foods are harder to digest and may make you feel sick to your stomach (nauseated).   Avoid alcoholic beverages for 24 hours or as instructed.  MEDICATIONS  You may resume your normal medications unless your doctor tells you otherwise.  WHAT YOU CAN EXPECT TODAY  Some feelings of bloating in the abdomen.   Passage of more gas than usual.   Spotting of blood in your stool or on the toilet paper.  IF YOU HAD POLYPS REMOVED DURING THE COLONOSCOPY:  No aspirin products for 7 days or as instructed.   No alcohol for 7 days or as instructed.   Eat a soft diet for the next 24 hours.  FINDING OUT THE RESULTS OF YOUR TEST Not all test results are available during your visit. If your test results are not back during the visit, make an appointment  with your caregiver to find out the results. Do not assume everything is normal if you have not heard from your caregiver or the medical facility. It is important for you to follow up on all of your test results.  SEEK IMMEDIATE MEDICAL ATTENTION IF:  You have more than a spotting of blood in your stool.   Your belly is swollen (abdominal distention).   You are nauseated or vomiting.   You have a temperature over 101.  You have abdominal pain or discomfort that is severe or gets worse throughout the day. EGD Discharge instructions Please read the instructions outlined below and refer to this sheet in the next few weeks. These discharge instructions provide you with general information on caring for yourself after you leave the hospital. Your doctor may also give you specific instructions. While your treatment has been planned according to the most current medical practices available, unavoidable complications occasionally occur. If you have any problems or questions after discharge, please call your doctor. ACTIVITY You may resume your regular activity but move at a slower pace for the next 24 hours.  Take frequent rest periods for the next 24 hours.  Walking will help expel (get rid of) the air and reduce the bloated feeling in your abdomen.  No driving for 24 hours (because of the anesthesia (medicine) used during the test).  You may shower.  Do not sign any important legal documents or operate any machinery for 24  hours (because of the anesthesia used during the test).  NUTRITION Drink plenty of fluids.  You may resume your normal diet.  Begin with a light meal and progress to your normal diet.  Avoid alcoholic beverages for 24 hours or as instructed by your caregiver.  MEDICATIONS You may resume your normal medications unless your caregiver tells you otherwise.  WHAT YOU CAN EXPECT TODAY You may experience abdominal discomfort such as a feeling of fullness or gas pains.   FOLLOW-UP Your doctor will discuss the results of your test with you.  SEEK IMMEDIATE MEDICAL ATTENTION IF ANY OF THE FOLLOWING OCCUR: Excessive nausea (feeling sick to your stomach) and/or vomiting.  Severe abdominal pain and distention (swelling).  Trouble swallowing.  Temperature over 101 F (37.8 C).  Rectal bleeding or vomiting of blood.    Continue Protonix 40 mg twice daily  GERD and diverticulosis information provided  Resume Coumadin today  Further recommendations to follow pending review of pathology report  Gastroesophageal Reflux Disease, Adult Gastroesophageal reflux disease (GERD) happens when acid from your stomach flows up into the esophagus. When acid comes in contact with the esophagus, the acid causes soreness (inflammation) in the esophagus. Over time, GERD may create small holes (ulcers) in the lining of the esophagus. CAUSES   Increased body weight. This puts pressure on the stomach, making acid rise from the stomach into the esophagus.  Smoking. This increases acid production in the stomach.  Drinking alcohol. This causes decreased pressure in the lower esophageal sphincter (valve or ring of muscle between the esophagus and stomach), allowing acid from the stomach into the esophagus.  Late evening meals and a full stomach. This increases pressure and acid production in the stomach.  A malformed lower esophageal sphincter. Sometimes, no cause is found. SYMPTOMS   Burning pain in the lower part of the mid-chest behind the breastbone and in the mid-stomach area. This may occur twice a week or more often.  Trouble swallowing.  Sore throat.  Dry cough.  Asthma-like symptoms including chest tightness, shortness of breath, or wheezing. DIAGNOSIS  Your caregiver may be able to diagnose GERD based on your symptoms. In some cases, X-rays and other tests may be done to check for complications or to check the condition of your stomach and  esophagus. TREATMENT  Your caregiver may recommend over-the-counter or prescription medicines to help decrease acid production. Ask your caregiver before starting or adding any new medicines.  HOME CARE INSTRUCTIONS   Change the factors that you can control. Ask your caregiver for guidance concerning weight loss, quitting smoking, and alcohol consumption.  Avoid foods and drinks that make your symptoms worse, such as:  Caffeine or alcoholic drinks.  Chocolate.  Peppermint or mint flavorings.  Garlic and onions.  Spicy foods.  Citrus fruits, such as oranges, lemons, or limes.  Tomato-based foods such as sauce, chili, salsa, and pizza.  Fried and fatty foods.  Avoid lying down for the 3 hours prior to your bedtime or prior to taking a nap.  Eat small, frequent meals instead of large meals.  Wear loose-fitting clothing. Do not wear anything tight around your waist that causes pressure on your stomach.  Raise the head of your bed 6 to 8 inches with wood blocks to help you sleep. Extra pillows will not help.  Only take over-the-counter or prescription medicines for pain, discomfort, or fever as directed by your caregiver.  Do not take aspirin, ibuprofen, or other nonsteroidal anti-inflammatory drugs (NSAIDs). Wise  CARE IF:   You have pain in your arms, neck, jaw, teeth, or back.  Your pain increases or changes in intensity or duration.  You develop nausea, vomiting, or sweating (diaphoresis).  You develop shortness of breath, or you faint.  Your vomit is green, yellow, black, or looks like coffee grounds or blood.  Your stool is red, bloody, or black. These symptoms could be signs of other problems, such as heart disease, gastric bleeding, or esophageal bleeding. MAKE SURE YOU:   Understand these instructions.  Will watch your condition.  Will get help right away if you are not doing well or get worse.  Diverticulosis Diverticulosis is the  condition that develops when small pouches (diverticula) form in the wall of your colon. Your colon, or large intestine, is where water is absorbed and stool is formed. The pouches form when the inside layer of your colon pushes through weak spots in the outer layers of your colon. CAUSES  No one knows exactly what causes diverticulosis. RISK FACTORS  Being older than 81. Your risk for this condition increases with age. Diverticulosis is rare in people younger than 40 years. By age 74, almost everyone has it.  Eating a low-fiber diet.  Being frequently constipated.  Being overweight.  Not getting enough exercise.  Smoking.  Taking over-the-counter pain medicines, like aspirin and ibuprofen. SYMPTOMS  Most people with diverticulosis do not have symptoms. DIAGNOSIS  Because diverticulosis often has no symptoms, health care providers often discover the condition during an exam for other colon problems. In many cases, a health care provider will diagnose diverticulosis while using a flexible scope to examine the colon (colonoscopy). TREATMENT  If you have never developed an infection related to diverticulosis, you may not need treatment. If you have had an infection before, treatment may include:  Eating more fruits, vegetables, and grains.  Taking a fiber supplement.  Taking a live bacteria supplement (probiotic).  Taking medicine to relax your colon. HOME CARE INSTRUCTIONS   Drink at least 6-8 glasses of water each day to prevent constipation.  Try not to strain when you have a bowel movement.  Keep all follow-up appointments. If you have had an infection before:  Increase the fiber in your diet as directed by your health care provider or dietitian.  Take a dietary fiber supplement if your health care provider approves.  Only take medicines as directed by your health care provider. SEEK MEDICAL CARE IF:   You have abdominal pain.  You have bloating.  You have  cramps.  You have not gone to the bathroom in 3 days. SEEK IMMEDIATE MEDICAL CARE IF:   Your pain gets worse.  Yourbloating becomes very bad.  You have a fever or chills, and your symptoms suddenly get worse.  You begin vomiting.  You have bowel movements that are bloody or black. MAKE SURE YOU:  Understand these instructions.  Will watch your condition.  Will get help right away if you are not doing well or get worse.

## 2014-12-28 NOTE — Op Note (Signed)
[facility] [address] [place], [address]   ENDOSCOPY PROCEDURE REPORT  PATIENT: [name], [name]  MR#: [id number] BIRTHDATE: 09/21/1935 , 79  yrs. old GENDER: female ENDOSCOPIST: R.  Garfield Cornea, MD FACP FACG REFERRED BY:  Delphina Cahill, M.D. PROCEDURE DATE:  2015/01/18 PROCEDURE:  EGD w/ biopsy and Maloney dilation of esophagus INDICATIONS:  GERD; esophageal dysphagia. MEDICATIONS: Versed 4 mg IV and fentanyl 75 g IV in divided doses. Phenergan 12.5 mg IV.  Zofran 4 mg IV.  Xylocaine gel orally ASA CLASS:      Class II  CONSENT: The risks, benefits, limitations, alternatives and imponderables have been discussed.  The potential for biopsy, esophogeal dilation, etc. have also been reviewed.  Questions have been answered.  All parties agreeable.  Please see the history and physical in the medical record for more information.  DESCRIPTION OF PROCEDURE: After the risks benefits and alternatives of the procedure were thoroughly explained, informed consent was obtained.  The [id number] ([id number]) endoscope was introduced through the mouth and advanced to the second portion of the duodenum , limited by Without limitations. The instrument was slowly withdrawn as the mucosa was fully examined. Estimated blood loss is zero unless otherwise noted in this procedure report.    Normal-appearing, patent tubular esophagus.  Stomach empty.  Small hiatal hernia.  Minimal nodularity antrum with overlying patchy erythema.  No ulcer or infiltrating process.  Patent pylorus. Normal-appearing first and second portion of the duodenum.  Scope was withdrawn and a 54 [place] Maloney dilator was passed to full insertion easily.  A look back revealed no apparent complication related to this maneuver.  Finally, biopsies abnormal gastric mucosa taken for histologic study.         The scope was then withdrawn from the patient and the procedure completed.  COMPLICATIONS: none EBL 3  mL ENDOSCOPIC IMPRESSION: Normal-appearing esophagus?"status post passage of a Maloney dilator. Hiatal hernia. Abnormal gastric mucosa of uncertain significance?"status post gastric biopsy immature  RECOMMENDATIONS: Continue current PPI regimen. Follow-up on pathology. See colonoscopy report.  REPEAT EXAM:  eSigned:  R. Garfield Cornea, MD Rosalita Chessman [facility] Jan 18, 2015 12:04 PM    CC:  CPT CODES: ICD CODES:  The ICD and CPT codes recommended by this software are interpretations from the data that the clinical staff has captured with the software.  The verification of the translation of this report to the ICD and CPT codes and modifiers is the sole responsibility of the health care institution and practicing physician where this report was generated.  [place]. will not be held responsible for the validity of the ICD and CPT codes included on this report.  AMA assumes no liability for data contained or not contained herein. CPT is a Designer, television/film set of the [employer].  PATIENT NAME:  Doreather, Hoxworth MR#: [id number]

## 2014-12-28 NOTE — Interval H&P Note (Signed)
History and Physical Interval Note:  12/28/2014 11:37 AM  [name]  has presented today for surgery, with the diagnosis of gerd, dysphagia, history of colon polyps, diarrhea  The various methods of treatment have been discussed with the patient and family. After consideration of risks, benefits and other options for treatment, the patient has consented to  Procedure(s) with comments: COLONOSCOPY (N/A) - 1145 ESOPHAGOGASTRODUODENOSCOPY (EGD) (N/A) ESOPHAGEAL DILATION (N/A) as a surgical intervention .  The patient's history has been reviewed, patient examined, no change in status, stable for surgery.  I have reviewed the patient's chart and labs.  Questions were answered to the patient's satisfaction.     Robert [name]  No change. No Coumadin since September 16.  EGD with possible esophageal dilation and colonoscopy per plan.  The risks, benefits, limitations, imponderables and alternatives regarding both EGD and colonoscopy have been reviewed with the patient. Questions have been answered. All parties agreeable.

## 2014-12-28 NOTE — Op Note (Signed)
[facility] [address] [place], [address]   COLONOSCOPY PROCEDURE REPORT  PATIENT: [name], [name]  MR#: [id number] BIRTHDATE: 09/21/1935 , 79  yrs. old GENDER: female ENDOSCOPIST: R.  Garfield Cornea, MD FACP [facility] REFERRED EY:CXKG Hall, M.D. PROCEDURE DATE:  Jan 20, 2015 PROCEDURE:   Ileo-colonoscopy with biopsy INDICATIONS:Chronic diarrhea; history of colonic adenoma. MEDICATIONS: Versed 6 mg IV and fentanyl 75 g IV in divided doses. Phenergan 12.5 mg IV.  Zofran 4 mg IV. ASA CLASS:       Class II  CONSENT: The risks, benefits, alternatives and imponderables including but not limited to bleeding, perforation as well as the possibility of a missed lesion have been reviewed.  The potential for biopsy, lesion removal, etc. have also been discussed. Questions have been answered.  All parties agreeable.  Please see the history and physical in the medical record for more information.  DESCRIPTION OF PROCEDURE:   After the risks benefits and alternatives of the procedure were thoroughly explained, informed consent was obtained.  The digital rectal exam revealed no abnormalities of the rectum.   The [id number] ([id number])  endoscope was introduced through the anus and advanced to the terminal ileum which was intubated for a short distance. No adverse events experienced.   The quality of the prep was adequate  The instrument was then slowly withdrawn as the colon was fully examined. Estimated blood loss is zero unless otherwise noted in this procedure report.      COLON FINDINGS: Normal-appearing rectal mucosa.  Surgical anastomosis at 20-25 cm from the anal verge.  Patient had scattered residual distal colonic diverticula; the remainder of the colonic mucosa appeared normal.  The distal 5 cm of terminal ileum mucosa also appeared normal.  segmental biopsies the ascending and distal colon taken for histologic study.  Retroflexion was performed. .  Withdrawal  time=8 minutes 0 seconds.  The scope was withdrawn and the procedure completed. COMPLICATIONS: EBL 3 mL ENDOSCOPIC IMPRESSION: Status post prior segmental resection. Few residual colonic divericula - status post segmental biopsy  RECOMMENDATIONS: Follow-up on pathology. See EGD report.  eSigned:  R. Garfield Cornea, MD Rosalita Chessman [facility] 20-Jan-2015 12:25 PM   cc:  CPT CODES: ICD CODES:  The ICD and CPT codes recommended by this software are interpretations from the data that the clinical staff has captured with the software.  The verification of the translation of this report to the ICD and CPT codes and modifiers is the sole responsibility of the health care institution and practicing physician where this report was generated.  [place]. will not be held responsible for the validity of the ICD and CPT codes included on this report.  AMA assumes no liability for data contained or not contained herein. CPT is a Designer, television/film set of the [employer].  PATIENT NAME:  [name], [name] MR#: [id number]

## 2014-12-28 NOTE — H&P (View-Only) (Signed)
Primary Care Physician:  HALL, ZACH, MD  Primary Gastroenterologist:  Michael [name], MD   Chief Complaint  Patient presents with  . Colonoscopy    HPI:  [name] is a 79 y.o. female here to schedule her colonoscopy. She was last seen in 05/2014. She has h/o diarrhea, abdominal pain, IBS, GERD, complicated diverticulitis requiring partial colectomy in the past. Last TCS 12/2011, she had multiple tubular adenomas removed and recommended to have next surveillance TCS in 12/2014. Her prep was "suboptimal". Patient previously evaluated for microscopic colitis in 2010. Celiac screen negative in 2015.   Diarrhea 5-6 times per day, during the night at times. Takes bentyl only as needed, doesn't take often because causes bad constipation. No brbpr, melena. Alternates between constipation/diarrhea. Feels knot if mid center abdomen prior to diarrhea. Massages it and then diarrhea starts. Noticed knot for 3-4 months. More tendency towards diarrhea. Up to 10 times per day at times. With constipation, may skip four days and will have to take something to start it. No recent antibiotics.   Heartburn poorly controlled. Nexium was much better than pantoprazole. Regurgitates often. Has trouble swallowing rice, mashed potatos. Causes hiccups when occurs. Uses water to wash down. Difficulty with certain large pills. No odynophagia.   Pain medications cut back. 110 per month from 120.   Lost 55 pounds with Dr. Nida.     Current Outpatient Prescriptions  Medication Sig Dispense Refill  . acetaminophen (TYLENOL) 325 MG tablet Take 650 mg by mouth every 6 (six) hours as needed for pain.     . ALPRAZolam (XANAX) 1 MG tablet Take 0.5 mg by mouth 2 (two) times daily as needed for anxiety.     . cloNIDine (CATAPRES - DOSED IN MG/24 HR) 0.1 mg/24hr patch APPLY 1 PATCH ONCE A WEEK AS DIRECTED 4 patch 6  . dicyclomine (BENTYL) 10 MG capsule Take 1 capsule (10 mg total) by mouth 4 (four) times daily -  before meals  and at bedtime. As needed for abdominal pain and diarrhea. 120 capsule 1  . hydrochlorothiazide 50 MG tablet Take 50 mg by mouth daily.     . insulin glargine (LANTUS) 100 UNIT/ML injection Inject 30 Units into the skin at bedtime.     . insulin lispro (HUMALOG) 100 UNIT/ML injection Inject 6 Units into the skin 3 (three) times daily. Prn per patient    . lidocaine (LIDODERM) 5 % Place 1 patch onto the skin daily as needed (Pain). Remove & Discard patch within 12 hours or as directed by MD    . Liraglutide 18 MG/3ML SOPN Inject 1.8 mg into the skin daily.     . metoprolol (LOPRESSOR) 25 MG tablet Take 1 tablet (25 mg total) by mouth 2 (two) times daily. 60 tablet 6  . ondansetron (ZOFRAN-ODT) 4 MG disintegrating tablet Take 4 mg by mouth every 8 (eight) hours as needed. Nausea and Vomiting    . oxyCODONE (ROXICODONE) 15 MG immediate release tablet Take 15 mg by mouth every 6 (six) hours as needed. Pain    . pantoprazole (PROTONIX) 40 MG tablet Take 1 tablet (40 mg total) by mouth 2 (two) times daily before a meal. 60 tablet 5  . simvastatin (ZOCOR) 20 MG tablet Take 1 tablet by mouth daily.    . tizanidine (ZANAFLEX) 2 MG capsule Take 2 mg by mouth 3 (three) times daily as needed for muscle spasms.     . warfarin (COUMADIN) 5 MG tablet Take 1/2 tablet daily except   1 tablet on Mondays and Fridays (Patient taking differently: Take 1/2 tablet daily M,W,F and 1 tablet on other days.) 30 tablet 5   No current facility-administered medications for this visit.    Allergies as of 11/29/2014 - Review Complete 10/03/2014  Allergen Reaction Noted  . Adhesive [tape] Itching 11/29/2011  . Demerol [meperidine]  02/22/2014  . Iohexol Nausea And Vomiting 03/08/2004  . Meperidine hcl Nausea And Vomiting   . Shellfish allergy Diarrhea and Nausea And Vomiting 08/03/2012  . Penicillins Nausea And Vomiting and Rash     Past Medical History  Diagnosis Date  . Diarrhea   . Esophageal reflux   . OSA  (obstructive sleep apnea)   . Fibromyalgia   . Nephrolithiasis   . Anxiety   . Mixed hyperlipidemia   . Essential hypertension, benign   . Colonoscopy causing post-procedural bleeding     Incomplete. by Dr. [name] 06/11/99 due to fixed non-compliant colon precluded exam to 40 cm, she had subsequent colectomy for diverticulitis since that time.   . Chronic back pain   . Hiatal hernia     Recent EGD/TCS showed small hiatal hernia, normal apperaring tubular esophagus. s/p passage of a 54-french Maloney dilator, s/p biopsy esophageal mucosa, multiple fundal gland type gastric polyps. one large pedunculated polp with oozing, noted s/p clipping and snare polypectomy. Biopsies of the gastric mucosa taken given her symptoms in her elevated count. normal D1-D3, s/p biospy D2-D3.   . Hiatal hernia     all bx unremarkable. on TCS she had left-sided diverticula, evidence of prior segmental resection with anastomosis, multiple colonic polyps s/p multiple snare polypectomies, s/p segmental biopsy and stool sampling. she had multiple tubular adenomas. no microscopic/collagenous colitis. stool culture, c diff, O+P, lactoferrin were negative.   . Ureteral stent retained     Not retained. ureteral stent removal gross hematuria resolved 10/11. coumadin restarted.   . Type 2 diabetes mellitus   . Permanent atrial fibrillation     Previous failed DCCV  . Asthma   . Ventral hernia     Past Surgical History  Procedure Laterality Date  . Total abdominal hysterectomy    . Tonsillectomy    . Colectomy      2005. Dr. DeStephano for diverticulitis  . Esophagogastroduodenoscopy  02/22/09    normal s/p dilator;small HH/one large pedunculates polyp  s/p clipping, hyperplastic  . Colonoscopy  02/22/09    normal/left-sided diverticula/multiple colonic polyp, adenomatous  . Breast lumpectomy      benign cyst  . Colonoscopy  12/16/2011    colonic polyps-treated as described above. Status postprior sigmoid resection.  adenomatous. next TCS 12/2014  . Cataract extraction w/phaco Left 08/17/2012    Procedure: CATARACT EXTRACTION PHACO AND INTRAOCULAR LENS PLACEMENT (IOC);  Surgeon: Kerry Hunt, MD;  Location: [facility];  Service: Ophthalmology;  Laterality: Left;  CDE:18.73  . Esophagogastroduodenoscopy  12/16/2011    [name]: small hiatal hernia. Hyperplastic apperating polyps. Status post Maloney dilation as described above.    Family History  Problem Relation Age of Onset  . Coronary artery disease      Family Hx   . Colon cancer Maternal Grandfather     Social History   Social History  . Marital Status: Widowed    Spouse Name: N/A  . Number of Children: N/A  . Years of Education: N/A   Occupational History  . Not on file.   Social History Main Topics  . Smoking status: Never Smoker   . Smokeless tobacco: Never   Used     Comment: tobacco use - no  . Alcohol Use: No  . Drug Use: No  . Sexual Activity: Not on file   Other Topics Concern  . Not on file   Social History Narrative      ROS:  General: Negative for anorexia, weight loss, fever, chills, fatigue, weakness. Eyes: Negative for vision changes.  ENT: Negative for hoarseness, difficulty swallowing , nasal congestion. CV: Negative for chest pain, angina, palpitations, dyspnea on exertion, peripheral edema.  Respiratory: Negative for dyspnea at rest, dyspnea on exertion, cough, sputum, wheezing.  GI: See history of present illness. GU:  Negative for dysuria, hematuria, urinary incontinence, urinary frequency, nocturnal urination.  MS: Negative for joint pain, low back pain.  Derm: Negative for rash or itching.  Neuro: Negative for weakness, abnormal sensation, seizure, frequent headaches, memory loss, confusion.  Psych: Negative for anxiety, depression, suicidal ideation, hallucinations.  Endo: Negative for unusual weight change.  Heme: Negative for bruising or bleeding. Allergy: Negative for rash or hives.    Physical  Examination:  BP 116/65 mmHg  Pulse 62  Temp(Src) 97.3 F (36.3 C) (Oral)  Ht 5' 3" (1.6 m)  Wt 201 lb 12.8 oz (91.536 kg)  BMI 35.76 kg/m2   General: Well-nourished, well-developed in no acute distress.  Head: Normocephalic, atraumatic.   Eyes: Conjunctiva pink, no icterus. Mouth: Oropharyngeal mucosa moist and pink , no lesions erythema or exudate. Neck: Supple without thyromegaly, masses, or lymphadenopathy.  Lungs: Clear to auscultation bilaterally.  Heart: Regular rate and rhythm, no murmurs rubs or gallops.  Abdomen: Bowel sounds are normal,mild tenderness with palpation mid-abd at site of rectus diastasis. Appears to be hernia involving this area too, most pronounces with sitting up and standing. Easily reducible. nondistended, no hepatosplenomegaly or masses, no abdominal bruits, no rebound or guarding.   Rectal: deferred Extremities: No lower extremity edema. No clubbing or deformities.  Neuro: Alert and oriented x 4 , grossly normal neurologically.  Skin: Warm and dry, no rash or jaundice.   Psych: Alert and cooperative, normal mood and affect.  Labs: Lab Results  Component Value Date   CREATININE 1.36* 02/28/2014   BUN 26* 02/28/2014   NA 140 02/28/2014   K 5.4* 02/28/2014   CL 104 02/28/2014   CO2 23 02/28/2014   Lab Results  Component Value Date   ALT 15 02/28/2014   AST 21 02/28/2014   ALKPHOS 87 02/28/2014   BILITOT 0.5 02/28/2014   Lab Results  Component Value Date   WBC 9.6 02/28/2014   HGB 15.3* 02/28/2014   HCT 44.1 02/28/2014   MCV 83.2 02/28/2014   PLT 262 02/28/2014     Imaging Studies: No results found.    

## 2014-12-30 ENCOUNTER — Encounter: Payer: Self-pay | Admitting: Internal Medicine

## 2015-01-02 ENCOUNTER — Encounter: Payer: Self-pay | Admitting: Internal Medicine

## 2015-01-03 ENCOUNTER — Encounter (HOSPITAL_COMMUNITY): Payer: Self-pay | Admitting: Internal Medicine

## 2015-01-05 ENCOUNTER — Ambulatory Visit (INDEPENDENT_AMBULATORY_CARE_PROVIDER_SITE_OTHER): Payer: Medicare Other | Admitting: *Deleted

## 2015-01-05 DIAGNOSIS — I482 Chronic atrial fibrillation: Secondary | ICD-10-CM | POA: Diagnosis not present

## 2015-01-05 DIAGNOSIS — I4891 Unspecified atrial fibrillation: Secondary | ICD-10-CM | POA: Diagnosis not present

## 2015-01-05 DIAGNOSIS — Z5181 Encounter for therapeutic drug level monitoring: Secondary | ICD-10-CM

## 2015-01-05 DIAGNOSIS — I4821 Permanent atrial fibrillation: Secondary | ICD-10-CM

## 2015-01-05 DIAGNOSIS — Z7901 Long term (current) use of anticoagulants: Secondary | ICD-10-CM | POA: Diagnosis not present

## 2015-01-05 LAB — POCT INR: INR: 2.3

## 2015-01-16 ENCOUNTER — Other Ambulatory Visit: Payer: Self-pay

## 2015-01-16 MED ORDER — INSULIN GLARGINE 100 UNIT/ML SOLOSTAR PEN: 30.0000 [IU] | PEN_INJECTOR | Freq: Every day | SUBCUTANEOUS | Status: DC

## 2015-01-16 MED ORDER — LIRAGLUTIDE 18 MG/3ML ~~LOC~~ SOPN: 1.8000 mg | PEN_INJECTOR | Freq: Every day | SUBCUTANEOUS | Status: DC

## 2015-01-19 DIAGNOSIS — R413 Other amnesia: Secondary | ICD-10-CM | POA: Diagnosis not present

## 2015-01-19 DIAGNOSIS — M25511 Pain in right shoulder: Secondary | ICD-10-CM | POA: Diagnosis not present

## 2015-01-19 DIAGNOSIS — M545 Low back pain: Secondary | ICD-10-CM | POA: Diagnosis not present

## 2015-01-19 DIAGNOSIS — M2550 Pain in unspecified joint: Secondary | ICD-10-CM | POA: Diagnosis not present

## 2015-01-19 DIAGNOSIS — Z79899 Other long term (current) drug therapy: Secondary | ICD-10-CM | POA: Diagnosis not present

## 2015-01-19 DIAGNOSIS — I10 Essential (primary) hypertension: Secondary | ICD-10-CM | POA: Diagnosis not present

## 2015-01-19 DIAGNOSIS — M797 Fibromyalgia: Secondary | ICD-10-CM | POA: Diagnosis not present

## 2015-02-02 ENCOUNTER — Ambulatory Visit (INDEPENDENT_AMBULATORY_CARE_PROVIDER_SITE_OTHER): Payer: Medicare Other | Admitting: *Deleted

## 2015-02-02 DIAGNOSIS — I4891 Unspecified atrial fibrillation: Secondary | ICD-10-CM | POA: Diagnosis not present

## 2015-02-02 DIAGNOSIS — I482 Chronic atrial fibrillation: Secondary | ICD-10-CM

## 2015-02-02 DIAGNOSIS — Z5181 Encounter for therapeutic drug level monitoring: Secondary | ICD-10-CM

## 2015-02-02 DIAGNOSIS — I4821 Permanent atrial fibrillation: Secondary | ICD-10-CM

## 2015-02-02 DIAGNOSIS — Z7901 Long term (current) use of anticoagulants: Secondary | ICD-10-CM

## 2015-02-02 LAB — POCT INR: INR: 2.7

## 2015-02-08 DIAGNOSIS — Z23 Encounter for immunization: Secondary | ICD-10-CM | POA: Diagnosis not present

## 2015-02-13 ENCOUNTER — Other Ambulatory Visit: Payer: Self-pay | Admitting: Cardiology

## 2015-02-28 ENCOUNTER — Ambulatory Visit (INDEPENDENT_AMBULATORY_CARE_PROVIDER_SITE_OTHER): Payer: Medicare Other | Admitting: Cardiology

## 2015-02-28 ENCOUNTER — Ambulatory Visit (INDEPENDENT_AMBULATORY_CARE_PROVIDER_SITE_OTHER): Payer: Medicare Other | Admitting: *Deleted

## 2015-02-28 ENCOUNTER — Encounter: Payer: Self-pay | Admitting: Cardiology

## 2015-02-28 VITALS — BP 131/76 | HR 75 | Ht 63.0 in | Wt 204.0 lb

## 2015-02-28 DIAGNOSIS — I209 Angina pectoris, unspecified: Secondary | ICD-10-CM | POA: Diagnosis not present

## 2015-02-28 DIAGNOSIS — I482 Chronic atrial fibrillation, unspecified: Secondary | ICD-10-CM

## 2015-02-28 DIAGNOSIS — I10 Essential (primary) hypertension: Secondary | ICD-10-CM

## 2015-02-28 DIAGNOSIS — I4891 Unspecified atrial fibrillation: Secondary | ICD-10-CM

## 2015-02-28 DIAGNOSIS — I4821 Permanent atrial fibrillation: Secondary | ICD-10-CM

## 2015-02-28 DIAGNOSIS — Z7901 Long term (current) use of anticoagulants: Secondary | ICD-10-CM | POA: Diagnosis not present

## 2015-02-28 DIAGNOSIS — Z5181 Encounter for therapeutic drug level monitoring: Secondary | ICD-10-CM

## 2015-02-28 LAB — POCT INR: INR: 2.3

## 2015-02-28 MED ORDER — NITROGLYCERIN 0.4 MG SL SUBL: 0.4000 mg | SUBLINGUAL_TABLET | SUBLINGUAL | Status: DC | PRN

## 2015-02-28 NOTE — Progress Notes (Signed)
Cardiology Office Note  Date: 02/28/2015   ID: [name], DOB 09/21/1935, MRN [id number]  PCP: [name], MD  Primary Cardiologist: [name], MD   Chief Complaint  Patient presents with  . Atrial Fibrillation    History of Present Illness: [name] is a 80 y.o. female last seen in May. She presents for a routine cardiac visit. At the last visit we reduced her Lopressor dose due to bradycardia. She states that she has felt better since then, heart rate is in the 70s today.  She does not report any significant palpitations on current regimen, has tolerated Coumadin with no bleeding problems. INR was therapeutic at 2.3 today.  She does state that she has had a feeling of tightness in her chest, seems to spread across, sometimes felt "numb" in her left arm. This has happened 4 times in the last few months. We discussed the possibility that this could be angina although she does not have any documented history of obstructive CAD. We discussed at least considering a stress test for further assessment, although she wanted to hold off at this time.   Past Medical History  Diagnosis Date  . Diarrhea   . Esophageal reflux   . OSA (obstructive sleep apnea)   . Fibromyalgia   . Nephrolithiasis   . Anxiety   . Mixed hyperlipidemia   . Essential hypertension, benign   . Colonoscopy causing post-procedural bleeding     Incomplete. by Dr. [name] 06/11/99 due to fixed non-compliant colon precluded exam to 40 cm, she had subsequent colectomy for diverticulitis since that time.   . Chronic back pain   . Hiatal hernia     Recent EGD/TCS showed small hiatal hernia, normal apperaring tubular esophagus. s/p passage of a 54-french Maloney dilator, s/p biopsy esophageal mucosa, multiple fundal gland type gastric polyps. one large pedunculated polp with oozing, noted s/p clipping and snare polypectomy. Biopsies of the gastric mucosa taken given her symptoms in her elevated count.  normal D1-D3, s/p biospy D2-D3.   [name] Hiatal hernia     all bx unremarkable. on TCS she had left-sided diverticula, evidence of prior segmental resection with anastomosis, multiple colonic polyps s/p multiple snare polypectomies, s/p segmental biopsy and stool sampling. she had multiple tubular adenomas. no microscopic/collagenous colitis. stool culture, c diff, O+P, lactoferrin were negative.   . Ureteral stent retained     Not retained. ureteral stent removal gross hematuria resolved 10/11. coumadin restarted.   . Type 2 diabetes mellitus ([place])   . Permanent atrial fibrillation ([facility])     Previous failed DCCV  . Asthma   . Ventral hernia     Current Outpatient Prescriptions  Medication Sig Dispense Refill  . acetaminophen (TYLENOL) 325 MG tablet Take 650 mg by mouth every 6 (six) hours as needed for pain.     [name] ALPRAZolam (XANAX) 1 MG tablet Take 0.5 mg by mouth 2 (two) times daily as needed for anxiety.     [name] amLODipine (NORVASC) 10 MG tablet Take 10 mg by mouth daily.    . cloNIDine (CATAPRES - DOSED IN MG/24 HR) 0.1 mg/24hr patch APPLY 1 PATCH ONCE A WEEK AS DIRECTED 4 patch 6  . hydrochlorothiazide 50 MG tablet Take 50 mg by mouth daily.     . Insulin Glargine (LANTUS SOLOSTAR) 100 UNIT/ML Solostar Pen Inject 30 Units into the skin daily at 10 pm. 5 pen 2  . insulin lispro (HUMALOG) 100 UNIT/ML injection Inject 6 Units into  the skin 3 (three) times daily. Prn per patient    . lidocaine (LIDODERM) 5 % Place 1 patch onto the skin daily as needed (Pain). Remove & Discard patch within 12 hours or as directed by MD    . Liraglutide (VICTOZA) 18 MG/3ML SOPN Inject 0.3 mLs (1.8 mg total) into the skin daily. 6 mL 2  . metoprolol tartrate (LOPRESSOR) 25 MG tablet TAKE 1 TABLET BY MOUTH TWICE DAILY 60 tablet 6  . ondansetron (ZOFRAN-ODT) 4 MG disintegrating tablet Take 4 mg by mouth every 8 (eight) hours as needed. Nausea and Vomiting    . oxyCODONE (ROXICODONE) 15 MG immediate release tablet Take  15 mg by mouth every 6 (six) hours as needed. Pain    . pantoprazole (PROTONIX) 40 MG tablet Take 1 tablet (40 mg total) by mouth 2 (two) times daily before a meal. 60 tablet 5  . simvastatin (ZOCOR) 20 MG tablet Take 1 tablet by mouth daily.    . tizanidine (ZANAFLEX) 2 MG capsule Take 2 mg by mouth 3 (three) times daily as needed for muscle spasms.     [name] warfarin (COUMADIN) 5 MG tablet Take 1 tablet (5 mg total) by mouth as directed. Take As Directed by Coumadin Clinic 30 tablet 3  . nitroGLYCERIN (NITROSTAT) 0.4 MG SL tablet Place 1 tablet (0.4 mg total) under the tongue every 5 (five) minutes x 3 doses as needed for chest pain. If no relief after the 3 rd dose, proceed to the ED for an evaluation 90 tablet 3   No current facility-administered medications for this visit.    Allergies:  Adhesive; Demerol; Iohexol; Meperidine hcl; Shellfish allergy; and Penicillins   Social History: The patient  reports that she has never smoked. She has never used smokeless tobacco. She reports that she does not drink alcohol or use illicit drugs.    ROS:  Please see the history of present illness. Otherwise, complete review of systems is positive for arthritic pains.  All other systems are reviewed and negative.   Physical Exam: VS:  BP 131/76 mmHg  Pulse 75  Ht 5\' 3"  (1.6 m)  Wt 204 lb (92.534 kg)  BMI 36.15 kg/m2  SpO2 97%, BMI Body mass index is 36.15 kg/(m^2).  Wt Readings from Last 3 Encounters:  02/28/15 204 lb (92.534 kg)  12/28/14 200 lb (90.719 kg)  11/29/14 201 lb 12.8 oz (91.536 kg)     Overweight woman in no acute distress.  HEENT: Conjunctiva and lids normal, oropharynx clear.  Neck: Supple, no elevated JVP or carotid bruits, no thyromegaly.  Lungs: Clear to auscultation, nonlabored breathing at rest.  Cardiac: Irregularly irregular, no S3 or significant systolic murmur.  Abdomen: Soft, nontender,bowel sounds present.  Extremities: Trace edema, distal pulses 2+.    ECG:  Tracing from 08/16/2014 showed atrial fibrillation with nonspecific T-wave changes.  Other Studies Reviewed Today:  Echocardiogram 11/16/2009 [facility]) reported mild to moderate LVH with LVEF 55-60%, mild to moderate left atrial enlargement, MAC with mild mitral regurgitation, mildly thickened aortic leaflets, trace tricuspid regurgitation, trivial pericardial effusion.  Assessment and Plan:  1. Chronic atrial fibrillation, heart rate is better on lower dose Lopressor, still adequately controlled. Continue Coumadin and follow-up in anticoagulation clinic.  2. Intermittent chest pain concerning for angina. Patient did not want to proceed with ischemic testing at this time. We gave her a prescription for nitroglycerin. I have asked her to call back if her symptoms worsen.   3. Essential hypertension, blood pressure control  is adequate today.  Current medicines were reviewed with the patient today.  Disposition: FU with me in 6 months.   Signed, [name], MD, [facility] 02/28/2015 1:26 PM    [facility] [place], [place], [place] [address] Phone: [phone]; Fax: [phone]

## 2015-02-28 NOTE — Patient Instructions (Signed)
Your physician recommends that you continue on your current medications as directed. Please refer to the Current Medication list given to you today. In addition, you have been given a prescription for nitroglycerin 0.4 mg sublingual for severe chest pain every 5 minutes up to 3 doses. If no relief after the third dose, proceed to the ED for an evaluation. Your physician recommends that you schedule a follow-up appointment in: 6 months. You will receive a reminder letter in the mail in about 4 months reminding you to call and schedule your appointment. If you don't receive this letter, please contact our office. Please let our office know if your symptoms continue or get worse.

## 2015-03-20 DIAGNOSIS — G4701 Insomnia due to medical condition: Secondary | ICD-10-CM | POA: Diagnosis not present

## 2015-03-20 DIAGNOSIS — G8929 Other chronic pain: Secondary | ICD-10-CM | POA: Diagnosis not present

## 2015-03-20 DIAGNOSIS — M25511 Pain in right shoulder: Secondary | ICD-10-CM | POA: Diagnosis not present

## 2015-03-20 DIAGNOSIS — E1122 Type 2 diabetes mellitus with diabetic chronic kidney disease: Secondary | ICD-10-CM | POA: Diagnosis not present

## 2015-03-20 DIAGNOSIS — E785 Hyperlipidemia, unspecified: Secondary | ICD-10-CM | POA: Diagnosis not present

## 2015-03-20 DIAGNOSIS — G43909 Migraine, unspecified, not intractable, without status migrainosus: Secondary | ICD-10-CM | POA: Diagnosis not present

## 2015-03-20 DIAGNOSIS — K219 Gastro-esophageal reflux disease without esophagitis: Secondary | ICD-10-CM | POA: Diagnosis not present

## 2015-03-20 DIAGNOSIS — Z79899 Other long term (current) drug therapy: Secondary | ICD-10-CM | POA: Diagnosis not present

## 2015-03-20 DIAGNOSIS — I10 Essential (primary) hypertension: Secondary | ICD-10-CM | POA: Diagnosis not present

## 2015-03-20 DIAGNOSIS — E119 Type 2 diabetes mellitus without complications: Secondary | ICD-10-CM | POA: Diagnosis not present

## 2015-03-20 DIAGNOSIS — M545 Low back pain: Secondary | ICD-10-CM | POA: Diagnosis not present

## 2015-03-20 DIAGNOSIS — M797 Fibromyalgia: Secondary | ICD-10-CM | POA: Diagnosis not present

## 2015-03-27 ENCOUNTER — Ambulatory Visit: Payer: Self-pay | Admitting: "Endocrinology

## 2015-03-29 ENCOUNTER — Ambulatory Visit: Payer: Medicare Other | Admitting: Nutrition

## 2015-04-05 ENCOUNTER — Ambulatory Visit (INDEPENDENT_AMBULATORY_CARE_PROVIDER_SITE_OTHER): Payer: Medicare Other | Admitting: Gastroenterology

## 2015-04-05 ENCOUNTER — Encounter: Payer: Self-pay | Admitting: Gastroenterology

## 2015-04-05 VITALS — BP 132/70 | HR 60 | Temp 97.4°F | Ht 63.0 in | Wt 208.0 lb

## 2015-04-05 DIAGNOSIS — I209 Angina pectoris, unspecified: Secondary | ICD-10-CM

## 2015-04-05 DIAGNOSIS — K219 Gastro-esophageal reflux disease without esophagitis: Secondary | ICD-10-CM

## 2015-04-05 DIAGNOSIS — K580 Irritable bowel syndrome with diarrhea: Secondary | ICD-10-CM

## 2015-04-05 MED ORDER — NYSTATIN 100000 UNIT/GM EX CREA: 1.0000 "application " | TOPICAL_CREAM | Freq: Two times a day (BID) | CUTANEOUS | Status: DC

## 2015-04-05 MED ORDER — RIFAXIMIN 550 MG PO TABS: 550.0000 mg | ORAL_TABLET | Freq: Three times a day (TID) | ORAL | Status: DC

## 2015-04-05 NOTE — Assessment & Plan Note (Signed)
Chronic diarrhea likely IBS-D. Extensive work up as previously outlined. Imodium and bentyl at small doses causes constipation. Trial of Xifaxan 550mg  TID for 2 weeks. Call with PR once complete.

## 2015-04-05 NOTE — Progress Notes (Signed)
cc'ed to pcp °
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
## 2015-04-05 NOTE — Assessment & Plan Note (Addendum)
On pantoprazole BID. Heartburn controlled but persistent regurgitation. Discussed antireflux measures. Continues to have dysphagia, offered barium esophagram but patient wants to hold off on all testing at this time due to finances. Ov in four months.  Intermittent epigastric/hypogastric discomfort, possibly related to hernia. Discussed alarm symptoms. Patient not interested in CT at this time.

## 2015-04-05 NOTE — Patient Instructions (Addendum)
1. Apply Nystatin to affected area/rash twice daily until rash is resolved and continue for 2 days beyond that.  2. Trial of Xifaxan three times daily for IBS-diarrhea.  3. Let me know if you have trouble getting your medications. 4. Call with progress report after you complete Xifaxan or sooner if needed. 5. If you decide to pursue barium study to evaluate your esophagus, let me know.  6. Return to the office in four months.

## 2015-04-11 ENCOUNTER — Ambulatory Visit (INDEPENDENT_AMBULATORY_CARE_PROVIDER_SITE_OTHER): Payer: Medicare Other | Admitting: *Deleted

## 2015-04-11 DIAGNOSIS — I482 Chronic atrial fibrillation: Secondary | ICD-10-CM | POA: Diagnosis not present

## 2015-04-11 DIAGNOSIS — Z7901 Long term (current) use of anticoagulants: Secondary | ICD-10-CM | POA: Diagnosis not present

## 2015-04-11 DIAGNOSIS — Z5181 Encounter for therapeutic drug level monitoring: Secondary | ICD-10-CM | POA: Diagnosis not present

## 2015-04-11 DIAGNOSIS — I4891 Unspecified atrial fibrillation: Secondary | ICD-10-CM | POA: Diagnosis not present

## 2015-04-11 DIAGNOSIS — I4821 Permanent atrial fibrillation: Secondary | ICD-10-CM

## 2015-04-11 LAB — POCT INR: INR: 2.1

## 2015-04-13 ENCOUNTER — Encounter: Payer: Medicare Other | Attending: "Endocrinology | Admitting: Nutrition

## 2015-04-13 ENCOUNTER — Encounter: Payer: Self-pay | Admitting: "Endocrinology

## 2015-04-13 ENCOUNTER — Ambulatory Visit (INDEPENDENT_AMBULATORY_CARE_PROVIDER_SITE_OTHER): Payer: Medicare Other | Admitting: "Endocrinology

## 2015-04-13 ENCOUNTER — Encounter: Payer: Self-pay | Admitting: Nutrition

## 2015-04-13 VITALS — BP 138/87 | HR 100 | Ht 64.0 in | Wt 206.0 lb

## 2015-04-13 DIAGNOSIS — E1122 Type 2 diabetes mellitus with diabetic chronic kidney disease: Secondary | ICD-10-CM | POA: Diagnosis not present

## 2015-04-13 DIAGNOSIS — Z794 Long term (current) use of insulin: Secondary | ICD-10-CM

## 2015-04-13 DIAGNOSIS — E785 Hyperlipidemia, unspecified: Secondary | ICD-10-CM | POA: Diagnosis not present

## 2015-04-13 DIAGNOSIS — N183 Chronic kidney disease, stage 3 unspecified: Secondary | ICD-10-CM

## 2015-04-13 DIAGNOSIS — I10 Essential (primary) hypertension: Secondary | ICD-10-CM

## 2015-04-13 DIAGNOSIS — E119 Type 2 diabetes mellitus without complications: Secondary | ICD-10-CM | POA: Insufficient documentation

## 2015-04-13 MED ORDER — INSULIN GLARGINE 100 UNIT/ML SOLOSTAR PEN: 26.0000 [IU] | PEN_INJECTOR | Freq: Every day | SUBCUTANEOUS | Status: DC

## 2015-04-13 NOTE — Progress Notes (Signed)
  Medical Nutrition Therapy:  Appt start time: [id number] end time:   [id number].  Assessment:  Primary concerns today: DIabetes. Most recent A1C  7.6%. FBS l 69-and evening BS 154-180's. Gained 2 lbs. Admits to baking a lot during the holidays and over correcting for low blood sugars and snacking at times. Has CKD and sees Dr. Hinda Lenis  . Needs to get back to walking and sticking to her diet better she admits. 30 units of lantus -reduced from 36  And takes 5 units of Humalog with sliding scale with meals unless BS is less than 90 mg/dl. Also on Victoza for weight loss and improved blood sugars. Diet is inconsistent with carbs at scheduled meals. Doesn't eat much breakfast.    Lab Results  Component Value Date   HGBA1C 7.6 04/19/2014   Wt Readings from Last 3 Encounters:  04/13/15 206 lb (93.441 kg)  04/13/15 206 lb (93.441 kg)  04/05/15 208 lb (94.348 kg)   Ht Readings from Last 3 Encounters:  04/13/15 5\' 3"  (1.6 m)  04/13/15 5\' 4"  (1.626 m)  04/05/15 5\' 3"  (1.6 m)   Body mass index is 36.5 kg/(m^2).   Preferred Learning Style:   Auditory  Visual  Hands on   Learning Readiness:   Ready  Change in progress  MEDICATIONS: See list   DIETARY INTAKE:  24-hr recall:  B ( AM): 1 cup of milk and 3-4 crackers, OR bacon, egg and 1 cup milk or fruit Snk ( AM):  L ( PM):  hasnt' eaten yet: yesterday:  Chicken, coleslaw, 1 slice bread, water. Snk ( PM): none D ( PM): can't remember.Snk ( PM): Beverages: water  Usual physical activity: walking most days a week but still has back problems. Estimated energy needs: 1500 calories 170 g carbohydrates 112 g protein 42 g fat  Progress Towards Goal(s):  In progress.   Nutritional Diagnosis:  NB-1.1 Food and nutrition-related knowledge deficit As related to Diabetes.  As evidenced by A1C 8.1%.    Intervention:  Nutrition counseling and diabetes education on diet,meal planning, CHO counting, portion sizes, signs/symptoms of  hyper/hypoglycemia and treatment, prevention of complications and benefits of exercise for Dm and weight loss. Reviewed carb counting and meal planning and how to not over correct for low blood sugars. Importance of calling Dr. Dorris Fetch if BS are in 70's so he can adjust insulin.  Goals:  Follow MY Plate as instructed. Don't skip meals. Increase fresh fruits and vegetables. Cut out snack between meals unless having a low blood sugars. Lose 6 lbs by next visit.  Teaching Method Utilized: See list Visual Auditory Hands on  Handouts given during visit include: Nutrition Guidelines for CKD My Plate  Barriers to learning/adherence to lifestyle change: none  Demonstrated degree of understanding via:  Teach Back   Monitoring/Evaluation:  Dietary intake, exercise, meal planning SBG, and body weight in 3 month(s).

## 2015-04-13 NOTE — Progress Notes (Signed)
Subjective:    Patient ID: [name], female    DOB: 09/21/1935, PCP [name], MD   Past Medical History  Diagnosis Date  . Diarrhea   . Esophageal reflux   . OSA (obstructive sleep apnea)   . Fibromyalgia   . Nephrolithiasis   . Anxiety   . Mixed hyperlipidemia   . Essential hypertension, benign   . Colonoscopy causing post-procedural bleeding     Incomplete. by Dr. [name] 06/11/99 due to fixed non-compliant colon precluded exam to 40 cm, she had subsequent colectomy for diverticulitis since that time.   . Chronic back pain   . Hiatal hernia     Recent EGD/TCS showed small hiatal hernia, normal apperaring tubular esophagus. s/p passage of a 54-french Maloney dilator, s/p biopsy esophageal mucosa, multiple fundal gland type gastric polyps. one large pedunculated polp with oozing, noted s/p clipping and snare polypectomy. Biopsies of the gastric mucosa taken given her symptoms in her elevated count. normal D1-D3, s/p biospy D2-D3.   [name] Hiatal hernia     all bx unremarkable. on TCS she had left-sided diverticula, evidence of prior segmental resection with anastomosis, multiple colonic polyps s/p multiple snare polypectomies, s/p segmental biopsy and stool sampling. she had multiple tubular adenomas. no microscopic/collagenous colitis. stool culture, c diff, O+P, lactoferrin were negative.   . Ureteral stent retained     Not retained. ureteral stent removal gross hematuria resolved 10/11. coumadin restarted.   . Type 2 diabetes mellitus ([place])   . Permanent atrial fibrillation (HCC)     Previous failed DCCV  . Asthma   . Ventral hernia    Past Surgical History  Procedure Laterality Date  . Total abdominal hysterectomy    . Tonsillectomy    . Colectomy      2005. Dr. Geoffry Paradise for diverticulitis  . Esophagogastroduodenoscopy  02/22/09    normal s/p dilator;small HH/one large pedunculates polyp  s/p clipping, hyperplastic  . Colonoscopy  02/22/09    normal/left-sided  diverticula/multiple colonic polyp, adenomatous  . Breast lumpectomy      benign cyst  . Colonoscopy  12/16/2011    colonic polyps-treated as described above. Status postprior sigmoid resection. adenomatous. next TCS 12/2014  . Cataract extraction w/phaco Left 08/17/2012    Procedure: CATARACT EXTRACTION PHACO AND INTRAOCULAR LENS PLACEMENT (IOC);  Surgeon: [name], MD;  Location: [facility];  Service: Ophthalmology;  Laterality: Left;  CDE:18.73  . Esophagogastroduodenoscopy  12/16/2011    [name]: small hiatal hernia. Hyperplastic apperating polyps. Status post [name] dilation as described above.  . Colonoscopy N/A 12/28/2014    Status post prior segmental resection. Few residual colonic diverticula- status post segmental biopsy negative random colon biopsies  . Esophagogastroduodenoscopy N/A 12/28/2014    RMR: Normal-appearing esophagus status post passage of a maloney dilator. Hiatal hernia. abnormal gastic mucosa of uncertain significance status post gastric biopsy with mild chronic gastritis, no H pylori.   . Esophageal dilation N/A 12/28/2014    Procedure: ESOPHAGEAL DILATION;  Surgeon: [name], MD;  Location: [facility];  Service: Endoscopy;  Laterality: N/A;   Social History   Social History  . Marital Status: Widowed    Spouse Name: N/A  . Number of Children: N/A  . Years of Education: N/A   Social History Main Topics  . Smoking status: Never Smoker   . Smokeless tobacco: Never Used     Comment: tobacco use - no  . Alcohol Use: No  . Drug Use: No  .  Sexual Activity: Not Asked   Other Topics Concern  . None   Social History Narrative   Outpatient Encounter Prescriptions as of 04/13/2015  Medication Sig  . acetaminophen (TYLENOL) 325 MG tablet Take 650 mg by mouth every 6 (six) hours as needed for pain.   [name] ALPRAZolam (XANAX) 1 MG tablet Take 0.5 mg by mouth 2 (two) times daily as needed for anxiety.   [name] amLODipine (NORVASC) 10 MG tablet Take 10 mg by mouth daily.  .  cloNIDine (CATAPRES - DOSED IN MG/24 HR) 0.1 mg/24hr patch APPLY 1 PATCH ONCE A WEEK AS DIRECTED  . hydrochlorothiazide 50 MG tablet Take 50 mg by mouth daily.   . Insulin Glargine (LANTUS SOLOSTAR) 100 UNIT/ML Solostar Pen Inject 26 Units into the skin daily at 10 pm.  . insulin lispro (HUMALOG) 100 UNIT/ML injection Inject 5-11 Units into the skin 3 (three) times daily with meals.  . Liraglutide (VICTOZA) 18 MG/3ML SOPN Inject 0.3 mLs (1.8 mg total) into the skin daily.  . metoprolol tartrate (LOPRESSOR) 25 MG tablet TAKE 1 TABLET BY MOUTH TWICE DAILY  . nitroGLYCERIN (NITROSTAT) 0.4 MG SL tablet Place 1 tablet (0.4 mg total) under the tongue every 5 (five) minutes x 3 doses as needed for chest pain. If no relief after the 3 rd dose, proceed to the ED for an evaluation  . nystatin cream (MYCOSTATIN) Apply 1 application topically 2 (two) times daily. Continue for two days after resolution of symptoms.  . ondansetron (ZOFRAN-ODT) 4 MG disintegrating tablet Take 4 mg by mouth every 8 (eight) hours as needed. Nausea and Vomiting  . oxyCODONE (ROXICODONE) 15 MG immediate release tablet Take 15 mg by mouth every 6 (six) hours as needed. Pain  . pantoprazole (PROTONIX) 40 MG tablet Take 1 tablet (40 mg total) by mouth 2 (two) times daily before a meal.  . rifaximin (XIFAXAN) 550 MG TABS tablet Take 1 tablet (550 mg total) by mouth 3 (three) times daily.  . simvastatin (ZOCOR) 20 MG tablet Take 1 tablet by mouth daily.  . tizanidine (ZANAFLEX) 2 MG capsule Take 2 mg by mouth 3 (three) times daily as needed for muscle spasms.   [name] warfarin (COUMADIN) 5 MG tablet Take 1 tablet (5 mg total) by mouth as directed. Take As Directed by [facility]  . [DISCONTINUED] Insulin Glargine (LANTUS SOLOSTAR) 100 UNIT/ML Solostar Pen Inject 30 Units into the skin daily at 10 pm.   No facility-administered encounter medications on file as of 04/13/2015.   ALLERGIES: Allergies  Allergen Reactions  . Adhesive [Tape]  Itching    Pulls skin off.   . Demerol [Meperidine]     Causes Vomiting  . Iohexol Nausea And Vomiting  . Meperidine Hcl Nausea And Vomiting  . Shellfish Allergy Diarrhea and Nausea And Vomiting  . Penicillins Nausea And Vomiting and Rash   VACCINATION STATUS:  There is no immunization history on file for this patient.  Diabetes She presents for her follow-up diabetic visit. She has type 2 diabetes mellitus. Onset time: She was diagnosed at approximate age of 89 years. Her disease course has been stable. There are no hypoglycemic associated symptoms. Pertinent negatives for hypoglycemia include no confusion, headaches, pallor or seizures. (She has a few random mild hypoglycemia episodes.) There are no diabetic associated symptoms. Pertinent negatives for diabetes include no chest pain, no polydipsia, no polyphagia and no polyuria. There are no hypoglycemic complications. Symptoms are stable. Diabetic complications include nephropathy. Risk factors for coronary artery disease  include diabetes mellitus, dyslipidemia, hypertension, obesity and sedentary lifestyle. Current diabetic treatment includes intensive insulin program. She is compliant with treatment most of the time. Her weight is stable. She is following a generally unhealthy diet. She has had a previous visit with a dietitian. Her home blood glucose trend is decreasing steadily. Her breakfast blood glucose range is generally 110-130 mg/dl. Her lunch blood glucose range is generally 130-140 mg/dl. Her dinner blood glucose range is generally 130-140 mg/dl. Her overall blood glucose range is 130-140 mg/dl. Eye exam is current.  Hyperlipidemia This is a chronic problem. The current episode started more than 1 year ago. Exacerbating diseases include diabetes. Pertinent negatives include no chest pain, myalgias or shortness of breath. Current antihyperlipidemic treatment includes statins. Risk factors for coronary artery disease include diabetes  mellitus, dyslipidemia, hypertension, obesity and a sedentary lifestyle.  Hypertension This is a chronic problem. The current episode started more than 1 year ago. Pertinent negatives include no chest pain, headaches, palpitations or shortness of breath. Risk factors for coronary artery disease include diabetes mellitus, dyslipidemia, obesity and sedentary lifestyle.     Review of Systems  Constitutional: Negative for fever, chills and unexpected weight change.  HENT: Negative for trouble swallowing and voice change.   Eyes: Negative for visual disturbance.  Respiratory: Negative for cough, shortness of breath and wheezing.   Cardiovascular: Negative for chest pain, palpitations and leg swelling.  Gastrointestinal: Negative for nausea, vomiting and diarrhea.  Endocrine: Negative for cold intolerance, heat intolerance, polydipsia, polyphagia and polyuria.  Musculoskeletal: Negative for myalgias and arthralgias.  Skin: Negative for color change, pallor, rash and wound.  Neurological: Negative for seizures and headaches.  Psychiatric/Behavioral: Negative for suicidal ideas and confusion.    Objective:    BP 138/87 mmHg  Pulse 100  Ht 5\' 4"  (1.626 m)  Wt 206 lb (93.441 kg)  BMI 35.34 kg/m2  SpO2 96%  Wt Readings from Last 3 Encounters:  04/13/15 206 lb (93.441 kg)  04/13/15 206 lb (93.441 kg)  04/05/15 208 lb (94.348 kg)    Physical Exam  Constitutional: She is oriented to person, place, and time. She appears well-developed.  HENT:  Head: Normocephalic and atraumatic.  Eyes: EOM are normal.  Neck: Normal range of motion. Neck supple. No tracheal deviation present. No thyromegaly present.  Cardiovascular: Normal rate and regular rhythm.   Pulmonary/Chest: Effort normal and breath sounds normal.  Abdominal: Soft. Bowel sounds are normal. There is no tenderness. There is no guarding.  Musculoskeletal: Normal range of motion. She exhibits no edema.  Neurological: She is alert and  oriented to person, place, and time. She has normal reflexes. No cranial nerve deficit. Coordination normal.  Skin: Skin is warm and dry. No rash noted. No erythema. No pallor.  Psychiatric: She has a normal mood and affect. Judgment normal.     CMP ( most recent) CMP     Component Value Date/Time   NA 140 02/28/2014 1050   K 5.4* 02/28/2014 1050   CL 104 02/28/2014 1050   CO2 23 02/28/2014 1050   GLUCOSE 128* 02/28/2014 1050   BUN 26* 02/28/2014 1050   CREATININE 1.36* 02/28/2014 1050   CREATININE 1.45* 08/11/2012 1048   CALCIUM 9.5 02/28/2014 1050   PROT 6.9 02/28/2014 1050   ALBUMIN 3.9 02/28/2014 1050   AST 21 02/28/2014 1050   ALT 15 02/28/2014 1050   ALKPHOS 87 02/28/2014 1050   BILITOT 0.5 02/28/2014 1050   GFRNONAA 34* 08/11/2012 1048   GFRAA 40*  08/11/2012 1048     Diabetic Labs (most recent): Lab Results  Component Value Date   HGBA1C 7.6 04/19/2014   HGBA1C * 07/11/2009    8.9 (NOTE) The ADA recommends the following therapeutic goal for glycemic control related to Hgb A1c measurement: Goal of therapy: <6.5 Hgb A1c  Reference: American Diabetes Association: Clinical Practice Recommendations 2010, Diabetes Care, 2010, 33: (Suppl  1).     Lipid Panel ( most recent) Lipid Panel     Component Value Date/Time   CHOL  07/11/2009 2342    115        ATP III CLASSIFICATION:  <200     mg/dL   Desirable  200-239  mg/dL   Borderline High  >=240    mg/dL   High          TRIG 174* 07/11/2009 2342   HDL 25* 07/11/2009 2342   CHOLHDL 4.6 07/11/2009 2342   VLDL 35 07/11/2009 2342   LDLCALC  07/11/2009 2342    55        Total Cholesterol/HDL:CHD Risk Coronary Heart Disease Risk Table                     Men   Women  1/2 Average Risk   3.4   3.3  Average Risk       5.0   4.4  2 X Average Risk   9.6   7.1  3 X Average Risk  23.4   11.0        Use the calculated Patient Ratio above and the CHD Risk Table to determine the patient's CHD Risk.        ATP III  CLASSIFICATION (LDL):  <100     mg/dL   Optimal  100-129  mg/dL   Near or Above                    Optimal  130-159  mg/dL   Borderline  160-189  mg/dL   High  >190     mg/dL   Very High      Assessment & Plan:   1. Diabetes mellitus with stage 3 chronic kidney disease (HCC)  - patient remains at a high risk for more acute and chronic complications of diabetes which include CAD, CVA, CKD, retinopathy, and neuropathy. These are all discussed in detail with the patient.  Patient came with controlled glucose profile, and  recent A1c of 7.6 %. He reports some random tight glycemic profile including one overnight dropped 48. - Glucose logs and insulin administration records pertaining to this visit,  to be scanned into patient's records.  Recent labs reviewed.   - I have re-counseled the patient on diet management and weight loss  by adopting a carbohydrate restricted / protein [name]  Diet.  - Suggestion is made for patient to avoid simple carbohydrates   from their diet including Cakes , Desserts, Ice Cream,  Soda (  diet and regular) , Sweet Tea , Candies,  Chips, Cookies, Artificial Sweeteners,   and "Sugar-free" Products .  This will help patient to have stable blood glucose profile and potentially avoid unintended  Weight gain.  - Patient is advised to stick to a routine mealtimes to eat 3 meals  a day and avoid unnecessary snacks ( to snack only to correct hypoglycemia).  - The patient  has been  scheduled with Jearld Fenton, RDN, CDE for individualized DM education.  - I have approached patient with  the following individualized plan to manage diabetes and patient agrees.  I will lower Lantus to 26 units QHS, Humalog 5 units TIDAC plus SSI , - continue Victoza 1.8mg  daily . -Side effects of Victoza is discussed with the patient.  -she is on Simvastatin 20 mg po qhs. -Patient is not a candidate for metformin and SGLT2 inhibitors due to CKD.  - Patient specific target  for A1c;  LDL, HDL, Triglycerides, and  Waist Circumference were discussed in detail.  2) BP/HTN: Controlled. Continue current medications.  3) Lipids/HPL:  continue statins. 4)  Weight/Diet: CDE consult in progress, exercise, and carbohydrates information provided.  5) Chronic Care/Health Maintenance:  -Patient is on  Statin medications and encouraged to continue to follow up with Ophthalmology, Podiatrist at least yearly or according to recommendations, and advised to  stay away from smoking. I have recommended yearly flu vaccine and pneumonia vaccination at least every 5 years; moderate intensity exercise for up to 150 minutes weekly; and  sleep for at least 7 hours a day.  - 25 minutes of time was spent on the care of this patient , 50% of which was applied for counseling on diabetes complications and their preventions.  - I advised patient to maintain close follow up with [name], MD for primary care needs.  Patient is asked to bring meter and  blood glucose logs during their next visit.   Follow up plan: -Return in about 3 months (around 07/12/2015) for diabetes, high blood pressure, high cholesterol.  Glade Lloyd, MD Phone: [phone]  Fax: [phone]   04/13/2015, 11:02 PM

## 2015-04-13 NOTE — Patient Instructions (Signed)

## 2015-04-13 NOTE — Patient Instructions (Signed)
Goals:  Follow MY Plate as instructed. Don't skip meals. Increase fresh fruits and vegetables. Cut out snack between meals unless having a low blood sugars. Lose 6 lbs by next visit.

## 2015-04-19 ENCOUNTER — Other Ambulatory Visit: Payer: Self-pay | Admitting: "Endocrinology

## 2015-04-30 DIAGNOSIS — Z794 Long term (current) use of insulin: Secondary | ICD-10-CM | POA: Diagnosis not present

## 2015-04-30 DIAGNOSIS — R112 Nausea with vomiting, unspecified: Secondary | ICD-10-CM | POA: Diagnosis not present

## 2015-04-30 DIAGNOSIS — I10 Essential (primary) hypertension: Secondary | ICD-10-CM | POA: Diagnosis not present

## 2015-04-30 DIAGNOSIS — Z79899 Other long term (current) drug therapy: Secondary | ICD-10-CM | POA: Diagnosis not present

## 2015-04-30 DIAGNOSIS — E7800 Pure hypercholesterolemia, unspecified: Secondary | ICD-10-CM | POA: Diagnosis not present

## 2015-04-30 DIAGNOSIS — E119 Type 2 diabetes mellitus without complications: Secondary | ICD-10-CM | POA: Diagnosis not present

## 2015-04-30 DIAGNOSIS — M797 Fibromyalgia: Secondary | ICD-10-CM | POA: Diagnosis not present

## 2015-04-30 DIAGNOSIS — J069 Acute upper respiratory infection, unspecified: Secondary | ICD-10-CM | POA: Diagnosis not present

## 2015-04-30 DIAGNOSIS — K219 Gastro-esophageal reflux disease without esophagitis: Secondary | ICD-10-CM | POA: Diagnosis not present

## 2015-04-30 DIAGNOSIS — R05 Cough: Secondary | ICD-10-CM | POA: Diagnosis not present

## 2015-04-30 DIAGNOSIS — I4891 Unspecified atrial fibrillation: Secondary | ICD-10-CM | POA: Diagnosis not present

## 2015-04-30 DIAGNOSIS — Z7901 Long term (current) use of anticoagulants: Secondary | ICD-10-CM | POA: Diagnosis not present

## 2015-04-30 DIAGNOSIS — R0602 Shortness of breath: Secondary | ICD-10-CM | POA: Diagnosis not present

## 2015-05-02 DIAGNOSIS — E119 Type 2 diabetes mellitus without complications: Secondary | ICD-10-CM | POA: Diagnosis not present

## 2015-05-02 DIAGNOSIS — E872 Acidosis: Secondary | ICD-10-CM | POA: Diagnosis not present

## 2015-05-02 DIAGNOSIS — I10 Essential (primary) hypertension: Secondary | ICD-10-CM | POA: Diagnosis not present

## 2015-05-03 DIAGNOSIS — E782 Mixed hyperlipidemia: Secondary | ICD-10-CM | POA: Diagnosis not present

## 2015-05-03 DIAGNOSIS — J309 Allergic rhinitis, unspecified: Secondary | ICD-10-CM | POA: Diagnosis not present

## 2015-05-03 DIAGNOSIS — N183 Chronic kidney disease, stage 3 (moderate): Secondary | ICD-10-CM | POA: Diagnosis not present

## 2015-05-03 DIAGNOSIS — K219 Gastro-esophageal reflux disease without esophagitis: Secondary | ICD-10-CM | POA: Diagnosis not present

## 2015-05-03 DIAGNOSIS — I10 Essential (primary) hypertension: Secondary | ICD-10-CM | POA: Diagnosis not present

## 2015-05-03 DIAGNOSIS — I482 Chronic atrial fibrillation: Secondary | ICD-10-CM | POA: Diagnosis not present

## 2015-05-03 DIAGNOSIS — E1122 Type 2 diabetes mellitus with diabetic chronic kidney disease: Secondary | ICD-10-CM | POA: Diagnosis not present

## 2015-05-18 ENCOUNTER — Other Ambulatory Visit: Payer: Self-pay

## 2015-05-19 MED ORDER — PANTOPRAZOLE SODIUM 40 MG PO TBEC: 40.0000 mg | DELAYED_RELEASE_TABLET | Freq: Two times a day (BID) | ORAL | Status: DC

## 2015-05-22 DIAGNOSIS — M797 Fibromyalgia: Secondary | ICD-10-CM | POA: Diagnosis not present

## 2015-05-22 DIAGNOSIS — G43909 Migraine, unspecified, not intractable, without status migrainosus: Secondary | ICD-10-CM | POA: Diagnosis not present

## 2015-05-22 DIAGNOSIS — M25511 Pain in right shoulder: Secondary | ICD-10-CM | POA: Diagnosis not present

## 2015-05-22 DIAGNOSIS — G4701 Insomnia due to medical condition: Secondary | ICD-10-CM | POA: Diagnosis not present

## 2015-05-22 DIAGNOSIS — E119 Type 2 diabetes mellitus without complications: Secondary | ICD-10-CM | POA: Diagnosis not present

## 2015-05-22 DIAGNOSIS — I10 Essential (primary) hypertension: Secondary | ICD-10-CM | POA: Diagnosis not present

## 2015-05-22 DIAGNOSIS — M545 Low back pain: Secondary | ICD-10-CM | POA: Diagnosis not present

## 2015-05-22 DIAGNOSIS — Z79899 Other long term (current) drug therapy: Secondary | ICD-10-CM | POA: Diagnosis not present

## 2015-05-23 ENCOUNTER — Ambulatory Visit (INDEPENDENT_AMBULATORY_CARE_PROVIDER_SITE_OTHER): Payer: Medicare Other | Admitting: *Deleted

## 2015-05-23 DIAGNOSIS — I482 Chronic atrial fibrillation: Secondary | ICD-10-CM | POA: Diagnosis not present

## 2015-05-23 DIAGNOSIS — Z5181 Encounter for therapeutic drug level monitoring: Secondary | ICD-10-CM

## 2015-05-23 DIAGNOSIS — Z7901 Long term (current) use of anticoagulants: Secondary | ICD-10-CM

## 2015-05-23 DIAGNOSIS — I4891 Unspecified atrial fibrillation: Secondary | ICD-10-CM

## 2015-05-23 DIAGNOSIS — I4821 Permanent atrial fibrillation: Secondary | ICD-10-CM

## 2015-05-23 LAB — POCT INR: INR: 1.8

## 2015-05-30 DIAGNOSIS — N139 Obstructive and reflux uropathy, unspecified: Secondary | ICD-10-CM | POA: Diagnosis not present

## 2015-05-30 DIAGNOSIS — I10 Essential (primary) hypertension: Secondary | ICD-10-CM | POA: Diagnosis not present

## 2015-05-30 DIAGNOSIS — E1129 Type 2 diabetes mellitus with other diabetic kidney complication: Secondary | ICD-10-CM | POA: Diagnosis not present

## 2015-05-30 DIAGNOSIS — N183 Chronic kidney disease, stage 3 (moderate): Secondary | ICD-10-CM | POA: Diagnosis not present

## 2015-06-15 ENCOUNTER — Other Ambulatory Visit: Payer: Self-pay | Admitting: *Deleted

## 2015-06-15 MED ORDER — WARFARIN SODIUM 5 MG PO TABS: ORAL_TABLET | ORAL | Status: DC

## 2015-06-16 ENCOUNTER — Ambulatory Visit (INDEPENDENT_AMBULATORY_CARE_PROVIDER_SITE_OTHER): Payer: Medicare Other | Admitting: *Deleted

## 2015-06-16 DIAGNOSIS — I482 Chronic atrial fibrillation: Secondary | ICD-10-CM | POA: Diagnosis not present

## 2015-06-16 DIAGNOSIS — I4821 Permanent atrial fibrillation: Secondary | ICD-10-CM

## 2015-06-16 DIAGNOSIS — I4891 Unspecified atrial fibrillation: Secondary | ICD-10-CM | POA: Diagnosis not present

## 2015-06-16 DIAGNOSIS — Z7901 Long term (current) use of anticoagulants: Secondary | ICD-10-CM

## 2015-06-16 DIAGNOSIS — Z5181 Encounter for therapeutic drug level monitoring: Secondary | ICD-10-CM

## 2015-06-16 LAB — POCT INR: INR: 2.7

## 2015-06-22 DIAGNOSIS — E109 Type 1 diabetes mellitus without complications: Secondary | ICD-10-CM | POA: Diagnosis not present

## 2015-06-22 DIAGNOSIS — H5203 Hypermetropia, bilateral: Secondary | ICD-10-CM | POA: Diagnosis not present

## 2015-06-22 DIAGNOSIS — Z794 Long term (current) use of insulin: Secondary | ICD-10-CM | POA: Diagnosis not present

## 2015-06-22 DIAGNOSIS — E103293 Type 1 diabetes mellitus with mild nonproliferative diabetic retinopathy without macular edema, bilateral: Secondary | ICD-10-CM | POA: Diagnosis not present

## 2015-06-22 LAB — HM DIABETES EYE EXAM

## 2015-07-12 ENCOUNTER — Other Ambulatory Visit: Payer: Self-pay | Admitting: "Endocrinology

## 2015-07-12 DIAGNOSIS — E785 Hyperlipidemia, unspecified: Secondary | ICD-10-CM | POA: Diagnosis not present

## 2015-07-12 DIAGNOSIS — K219 Gastro-esophageal reflux disease without esophagitis: Secondary | ICD-10-CM | POA: Diagnosis not present

## 2015-07-12 DIAGNOSIS — I10 Essential (primary) hypertension: Secondary | ICD-10-CM | POA: Diagnosis not present

## 2015-07-12 DIAGNOSIS — M25552 Pain in left hip: Secondary | ICD-10-CM | POA: Diagnosis not present

## 2015-07-12 DIAGNOSIS — M797 Fibromyalgia: Secondary | ICD-10-CM | POA: Diagnosis not present

## 2015-07-12 DIAGNOSIS — G8929 Other chronic pain: Secondary | ICD-10-CM | POA: Diagnosis not present

## 2015-07-12 DIAGNOSIS — M545 Low back pain: Secondary | ICD-10-CM | POA: Diagnosis not present

## 2015-07-12 DIAGNOSIS — Z79899 Other long term (current) drug therapy: Secondary | ICD-10-CM | POA: Diagnosis not present

## 2015-07-12 DIAGNOSIS — G43909 Migraine, unspecified, not intractable, without status migrainosus: Secondary | ICD-10-CM | POA: Diagnosis not present

## 2015-07-12 DIAGNOSIS — E119 Type 2 diabetes mellitus without complications: Secondary | ICD-10-CM | POA: Diagnosis not present

## 2015-07-12 DIAGNOSIS — M25511 Pain in right shoulder: Secondary | ICD-10-CM | POA: Diagnosis not present

## 2015-07-12 DIAGNOSIS — G4701 Insomnia due to medical condition: Secondary | ICD-10-CM | POA: Diagnosis not present

## 2015-07-14 ENCOUNTER — Encounter: Payer: Medicare Other | Attending: "Endocrinology | Admitting: Nutrition

## 2015-07-14 ENCOUNTER — Encounter: Payer: Self-pay | Admitting: Nutrition

## 2015-07-14 ENCOUNTER — Ambulatory Visit (INDEPENDENT_AMBULATORY_CARE_PROVIDER_SITE_OTHER): Payer: Medicare Other | Admitting: "Endocrinology

## 2015-07-14 ENCOUNTER — Encounter: Payer: Self-pay | Admitting: "Endocrinology

## 2015-07-14 VITALS — BP 141/80 | HR 80 | Ht 63.0 in | Wt 209.0 lb

## 2015-07-14 VITALS — Ht 63.0 in | Wt 209.0 lb

## 2015-07-14 DIAGNOSIS — E1122 Type 2 diabetes mellitus with diabetic chronic kidney disease: Secondary | ICD-10-CM

## 2015-07-14 DIAGNOSIS — E119 Type 2 diabetes mellitus without complications: Secondary | ICD-10-CM | POA: Insufficient documentation

## 2015-07-14 DIAGNOSIS — N183 Chronic kidney disease, stage 3 unspecified: Secondary | ICD-10-CM

## 2015-07-14 DIAGNOSIS — E1165 Type 2 diabetes mellitus with hyperglycemia: Secondary | ICD-10-CM

## 2015-07-14 DIAGNOSIS — I10 Essential (primary) hypertension: Secondary | ICD-10-CM | POA: Diagnosis not present

## 2015-07-14 DIAGNOSIS — E118 Type 2 diabetes mellitus with unspecified complications: Secondary | ICD-10-CM | POA: Insufficient documentation

## 2015-07-14 DIAGNOSIS — E785 Hyperlipidemia, unspecified: Secondary | ICD-10-CM | POA: Diagnosis not present

## 2015-07-14 DIAGNOSIS — E669 Obesity, unspecified: Secondary | ICD-10-CM

## 2015-07-14 NOTE — Progress Notes (Signed)
Subjective:    Patient ID: [name], female    DOB: 09/21/1935, PCP [name], MD   Past Medical History  Diagnosis Date  . Diarrhea   . Esophageal reflux   . OSA (obstructive sleep apnea)   . Fibromyalgia   . Nephrolithiasis   . Anxiety   . Mixed hyperlipidemia   . Essential hypertension, benign   . Colonoscopy causing post-procedural bleeding     Incomplete. by Dr. [name] 06/11/99 due to fixed non-compliant colon precluded exam to 40 cm, she had subsequent colectomy for diverticulitis since that time.   . Chronic back pain   . Hiatal hernia     Recent EGD/TCS showed small hiatal hernia, normal apperaring tubular esophagus. s/p passage of a 54-french Maloney dilator, s/p biopsy esophageal mucosa, multiple fundal gland type gastric polyps. one large pedunculated polp with oozing, noted s/p clipping and snare polypectomy. Biopsies of the gastric mucosa taken given her symptoms in her elevated count. normal D1-D3, s/p biospy D2-D3.   [name] Hiatal hernia     all bx unremarkable. on TCS she had left-sided diverticula, evidence of prior segmental resection with anastomosis, multiple colonic polyps s/p multiple snare polypectomies, s/p segmental biopsy and stool sampling. she had multiple tubular adenomas. no microscopic/collagenous colitis. stool culture, c diff, O+P, lactoferrin were negative.   . Ureteral stent retained     Not retained. ureteral stent removal gross hematuria resolved 10/11. coumadin restarted.   . Type 2 diabetes mellitus ([place])   . Permanent atrial fibrillation (HCC)     Previous failed DCCV  . Asthma   . Ventral hernia    Past Surgical History  Procedure Laterality Date  . Total abdominal hysterectomy    . Tonsillectomy    . Colectomy      2005. Dr. Geoffry Paradise for diverticulitis  . Esophagogastroduodenoscopy  02/22/09    normal s/p dilator;small HH/one large pedunculates polyp  s/p clipping, hyperplastic  . Colonoscopy  02/22/09    normal/left-sided  diverticula/multiple colonic polyp, adenomatous  . Breast lumpectomy      benign cyst  . Colonoscopy  12/16/2011    colonic polyps-treated as described above. Status postprior sigmoid resection. adenomatous. next TCS 12/2014  . Cataract extraction w/phaco Left 08/17/2012    Procedure: CATARACT EXTRACTION PHACO AND INTRAOCULAR LENS PLACEMENT (IOC);  Surgeon: [name], MD;  Location: [facility];  Service: Ophthalmology;  Laterality: Left;  CDE:18.73  . Esophagogastroduodenoscopy  12/16/2011    [name]: small hiatal hernia. Hyperplastic apperating polyps. Status post [name] dilation as described above.  . Colonoscopy N/A 12/28/2014    Status post prior segmental resection. Few residual colonic diverticula- status post segmental biopsy negative random colon biopsies  . Esophagogastroduodenoscopy N/A 12/28/2014    RMR: Normal-appearing esophagus status post passage of a maloney dilator. Hiatal hernia. abnormal gastic mucosa of uncertain significance status post gastric biopsy with mild chronic gastritis, no H pylori.   . Esophageal dilation N/A 12/28/2014    Procedure: ESOPHAGEAL DILATION;  Surgeon: [name], MD;  Location: [facility];  Service: Endoscopy;  Laterality: N/A;   Social History   Social History  . Marital Status: Widowed    Spouse Name: N/A  . Number of Children: N/A  . Years of Education: N/A   Social History Main Topics  . Smoking status: Never Smoker   . Smokeless tobacco: Never Used     Comment: tobacco use - no  . Alcohol Use: No  . Drug Use: No  .  Sexual Activity: Not Asked   Other Topics Concern  . None   Social History Narrative   Outpatient Encounter Prescriptions as of 07/14/2015  Medication Sig  . acetaminophen (TYLENOL) 325 MG tablet Take 650 mg by mouth every 6 (six) hours as needed for pain.   [name] ALPRAZolam (XANAX) 1 MG tablet Take 0.5 mg by mouth 2 (two) times daily as needed for anxiety.   [name] amLODipine (NORVASC) 10 MG tablet Take 10 mg by mouth daily.  .  cloNIDine (CATAPRES - DOSED IN MG/24 HR) 0.1 mg/24hr patch APPLY 1 PATCH ONCE A WEEK AS DIRECTED  . hydrochlorothiazide 50 MG tablet Take 50 mg by mouth daily.   . Insulin Glargine (LANTUS SOLOSTAR) 100 UNIT/ML Solostar Pen Inject 26 Units into the skin daily at 10 pm.  . insulin lispro (HUMALOG) 100 UNIT/ML injection Inject 5-11 Units into the skin 3 (three) times daily with meals.  . metoprolol tartrate (LOPRESSOR) 25 MG tablet TAKE 1 TABLET BY MOUTH TWICE DAILY  . nitroGLYCERIN (NITROSTAT) 0.4 MG SL tablet Place 1 tablet (0.4 mg total) under the tongue every 5 (five) minutes x 3 doses as needed for chest pain. If no relief after the 3 rd dose, proceed to the ED for an evaluation  . nystatin cream (MYCOSTATIN) Apply 1 application topically 2 (two) times daily. Continue for two days after resolution of symptoms.  . ondansetron (ZOFRAN-ODT) 4 MG disintegrating tablet Take 4 mg by mouth every 8 (eight) hours as needed. Nausea and Vomiting  . oxyCODONE (ROXICODONE) 15 MG immediate release tablet Take 15 mg by mouth every 6 (six) hours as needed. Pain  . pantoprazole (PROTONIX) 40 MG tablet Take 1 tablet (40 mg total) by mouth 2 (two) times daily before a meal.  . rifaximin (XIFAXAN) 550 MG TABS tablet Take 1 tablet (550 mg total) by mouth 3 (three) times daily.  . simvastatin (ZOCOR) 20 MG tablet Take 1 tablet by mouth daily.  . tizanidine (ZANAFLEX) 2 MG capsule Take 2 mg by mouth 3 (three) times daily as needed for muscle spasms.   [name] VICTOZA 18 MG/3ML SOPN INJECT 0.3 MLS (1.8 MG TOTAL) INTO THE SKIN DAILY.  [name] warfarin (COUMADIN) 5 MG tablet Take 1/2 tablet daily except 1 tablet on Mondays, Wednesdays and Fridays or as directed by [facility]mad[facility] Clinic   No facility-administered encounter medications on file as of 07/14/2015.   ALLERGIES: Allergies  Allergen Reactions  . Adhesive [Tape] Itching    Pulls skin off.   . Demerol [Meperidine]     Causes Vomiting  . Iohexol Nausea And Vomiting  .  Meperidine Hcl Nausea And Vomiting  . Shellfish Allergy Diarrhea and Nausea And Vomiting  . Penicillins Nausea And Vomiting and Rash   VACCINATION STATUS:  There is no immunization history on file for this patient.  Diabetes She presents for her follow-up diabetic visit. She has type 2 diabetes mellitus. Onset time: She was diagnosed at approximate age of 62 years. Her disease course has been stable. There are no hypoglycemic associated symptoms. Pertinent negatives for hypoglycemia include no confusion, headaches, pallor or seizures. (She has a few random mild hypoglycemia episodes.) There are no diabetic associated symptoms. Pertinent negatives for diabetes include no chest pain, no polydipsia, no polyphagia and no polyuria. There are no hypoglycemic complications. Symptoms are stable. Diabetic complications include nephropathy. Risk factors for coronary artery disease include diabetes mellitus, dyslipidemia, hypertension, obesity and sedentary lifestyle. Current diabetic treatment includes intensive insulin program. She is compliant with  treatment most of the time. Her weight is stable. She is following a generally unhealthy diet. She has had a previous visit with a dietitian. Her home blood glucose trend is decreasing steadily. Her breakfast blood glucose range is generally 130-140 mg/dl. Her lunch blood glucose range is generally 130-140 mg/dl. Her dinner blood glucose range is generally 130-140 mg/dl. Her overall blood glucose range is 130-140 mg/dl. Eye exam is current.  Hyperlipidemia This is a chronic problem. The current episode started more than 1 year ago. Exacerbating diseases include diabetes. Pertinent negatives include no chest pain, myalgias or shortness of breath. Current antihyperlipidemic treatment includes statins. Risk factors for coronary artery disease include diabetes mellitus, dyslipidemia, hypertension, obesity and a sedentary lifestyle.  Hypertension This is a chronic  problem. The current episode started more than 1 year ago. Pertinent negatives include no chest pain, headaches, palpitations or shortness of breath. Risk factors for coronary artery disease include diabetes mellitus, dyslipidemia, obesity and sedentary lifestyle.     Review of Systems  Constitutional: Negative for fever, chills and unexpected weight change.  HENT: Negative for trouble swallowing and voice change.   Eyes: Negative for visual disturbance.  Respiratory: Negative for cough, shortness of breath and wheezing.   Cardiovascular: Negative for chest pain, palpitations and leg swelling.  Gastrointestinal: Negative for nausea, vomiting and diarrhea.  Endocrine: Negative for cold intolerance, heat intolerance, polydipsia, polyphagia and polyuria.  Musculoskeletal: Negative for myalgias and arthralgias.  Skin: Negative for color change, pallor, rash and wound.  Neurological: Negative for seizures and headaches.  Psychiatric/Behavioral: Negative for suicidal ideas and confusion.    Objective:    BP 141/80 mmHg  Pulse 80  Ht 5\' 3"  (1.6 m)  Wt 209 lb (94.802 kg)  BMI 37.03 kg/m2  SpO2 99%  Wt Readings from Last 3 Encounters:  07/14/15 209 lb (94.802 kg)  04/13/15 206 lb (93.441 kg)  04/13/15 206 lb (93.441 kg)    Physical Exam  Constitutional: She is oriented to person, place, and time. She appears well-developed.  HENT:  Head: Normocephalic and atraumatic.  Eyes: EOM are normal.  Neck: Normal range of motion. Neck supple. No tracheal deviation present. No thyromegaly present.  Cardiovascular: Normal rate and regular rhythm.   Pulmonary/Chest: Effort normal and breath sounds normal.  Abdominal: Soft. Bowel sounds are normal. There is no tenderness. There is no guarding.  Musculoskeletal: Normal range of motion. She exhibits no edema.  Neurological: She is alert and oriented to person, place, and time. She has normal reflexes. No cranial nerve deficit. Coordination normal.   Skin: Skin is warm and dry. No rash noted. No erythema. No pallor.  Psychiatric: She has a normal mood and affect. Judgment normal.     CMP ( most recent) CMP     Component Value Date/Time   NA 140 02/28/2014 1050   K 5.4* 02/28/2014 1050   CL 104 02/28/2014 1050   CO2 23 02/28/2014 1050   GLUCOSE 128* 02/28/2014 1050   BUN 26* 02/28/2014 1050   CREATININE 1.36* 02/28/2014 1050   CREATININE 1.45* 08/11/2012 1048   CALCIUM 9.5 02/28/2014 1050   PROT 6.9 02/28/2014 1050   ALBUMIN 3.9 02/28/2014 1050   AST 21 02/28/2014 1050   ALT 15 02/28/2014 1050   ALKPHOS 87 02/28/2014 1050   BILITOT 0.5 02/28/2014 1050   GFRNONAA 34* 08/11/2012 1048   GFRAA 40* 08/11/2012 1048     Diabetic Labs (most recent): Lab Results  Component Value Date   HGBA1C 7.6  04/19/2014   HGBA1C * 07/11/2009    8.9 (NOTE) The ADA recommends the following therapeutic goal for glycemic control related to Hgb A1c measurement: Goal of therapy: <6.5 Hgb A1c  Reference: American Diabetes Association: Clinical Practice Recommendations 2010, Diabetes Care, 2010, 33: (Suppl  1).     Lipid Panel ( most recent) Lipid Panel     Component Value Date/Time   CHOL  07/11/2009 2342    115        ATP III CLASSIFICATION:  <200     mg/dL   Desirable  200-239  mg/dL   Borderline High  >=240    mg/dL   High          TRIG 174* 07/11/2009 2342   HDL 25* 07/11/2009 2342   CHOLHDL 4.6 07/11/2009 2342   VLDL 35 07/11/2009 2342   LDLCALC  07/11/2009 2342    55        Total Cholesterol/HDL:CHD Risk Coronary Heart Disease Risk Table                     Men   Women  1/2 Average Risk   3.4   3.3  Average Risk       5.0   4.4  2 X Average Risk   9.6   7.1  3 X Average Risk  23.4   11.0        Use the calculated Patient Ratio above and the CHD Risk Table to determine the patient's CHD Risk.        ATP III CLASSIFICATION (LDL):  <100     mg/dL   Optimal  100-129  mg/dL   Near or Above                    Optimal   130-159  mg/dL   Borderline  160-189  mg/dL   High  >190     mg/dL   Very High      Assessment & Plan:   1. Diabetes mellitus with stage 3 chronic kidney disease (HCC)  - patient remains at a high risk for more acute and chronic complications of diabetes which include CAD, CVA, CKD, retinopathy, and neuropathy. These are all discussed in detail with the patient.  Patient came with controlled glucose profile, and  recent labs did not include A1c, during her last visit A1c was 7.6 %.  - Glucose logs and insulin administration records pertaining to this visit,  to be scanned into patient's records.  Recent labs reviewed.   - I have re-counseled the patient on diet management and weight loss  by adopting a carbohydrate restricted / protein [name]  Diet.  - Suggestion is made for patient to avoid simple carbohydrates   from their diet including Cakes , Desserts, Ice Cream,  Soda (  diet and regular) , Sweet Tea , Candies,  Chips, Cookies, Artificial Sweeteners,   and "Sugar-free" Products .  This will help patient to have stable blood glucose profile and potentially avoid unintended  Weight gain.  - Patient is advised to stick to a routine mealtimes to eat 3 meals  a day and avoid unnecessary snacks ( to snack only to correct hypoglycemia).  - The patient  has been  scheduled with Jearld Fenton, RDN, CDE for individualized DM education.  - I have approached patient with the following individualized plan to manage diabetes and patient agrees.  - I will continue Lantus  26 units QHS, continue Humalog  5 units TIDAC plus SSI , - continue Victoza 1.8mg  daily . -Side effects of Victoza is discussed with the patient.  -she is on Simvastatin 20 mg po qhs. -Patient is not a candidate for metformin and SGLT2 inhibitors due to CKD.  - Patient specific target  for A1c; LDL, HDL, Triglycerides, and  Waist Circumference were discussed in detail.  2) BP/HTN: Controlled. Continue current  medications.  3) Lipids/HPL:  continue statins. 4)  Weight/Diet: CDE consult in progress, exercise, and carbohydrates information provided.  5) Chronic Care/Health Maintenance:  -Patient is on  Statin medications and encouraged to continue to follow up with Ophthalmology, Podiatrist at least yearly or according to recommendations, and advised to  stay away from smoking. I have recommended yearly flu vaccine and pneumonia vaccination at least every 5 years; moderate intensity exercise for up to 150 minutes weekly; and  sleep for at least 7 hours a day.  - 25 minutes of time was spent on the care of this patient , 50% of which was applied for counseling on diabetes complications and their preventions.  - I advised patient to maintain close follow up with [name], MD for primary care needs.  Patient is asked to bring meter and  blood glucose logs during their next visit.   Follow up plan: -Return in about 3 months (around 10/13/2015) for diabetes, high blood pressure, high cholesterol, follow up with pre-visit labs, meter, and logs.  Glade Lloyd, MD Phone: [phone]  Fax: [phone]   07/14/2015, 11:24 AM

## 2015-07-14 NOTE — Progress Notes (Signed)
   Medical Nutrition Therapy:  Appt start time: [id number] end time:   [id number].  Assessment:  Primary concerns today: DIabetes. Most recent A1C  7.6%   FBS 92-158 mg/dl. Has cut out late night snacks and cut out snacks between meals. Trying to walk more. Has her 2 grown boys who live with her. No recent A1C. Had been under stress with her boys in hospital for surgeries. She has gained 3 lbs since last visit. She feels like she has a little swelling in her legs that isn't usual. Advised to call MD if it doesn't go down by tomorrow. She did eat hot dogs and sauerkraut, which is high in sodium and may contribute to her fluid buildup. She is normally avoiding high sodium foods. Food journal brought in and her food choices are very good and well balance meals. BS are lower now than they use to be per her report.  Trying to exercise more.    26 units of Lantus and 5 units of Humalog with meals.    Lab Results  Component Value Date   HGBA1C 7.6 04/19/2014    Lab Results  Component Value Date   HGBA1C 7.6 04/19/2014   Wt Readings from Last 3 Encounters:  07/14/15 209 lb (94.802 kg)  07/14/15 209 lb (94.802 kg)  04/13/15 206 lb (93.441 kg)   Ht Readings from Last 3 Encounters:  07/14/15 5\' 3"  (1.6 m)  07/14/15 5\' 3"  (1.6 m)  04/13/15 5\' 3"  (1.6 m)   Body mass index is 37.03 kg/(m^2).   Preferred Learning Style:   Auditory  Visual  Hands on   Learning Readiness:   Ready  Change in progress  MEDICATIONS: See list   DIETARY INTAKE:  24-hr recall:  B ( AM):  1 egg, 1 slice bread and fruit, water Snk ( AM):  L ( PM):  Chicken, green beans, salad, water Snk ( PM): none D ( PM): Hot dog, sauerkraut, water, fruit   .Snk ( PM): Beverages: water  Usual physical activity: walking most days a week but still has back problems.   Estimated energy needs: 1500 calories 170 g carbohydrates 112 g protein 42 g fat  Progress Towards Goal(s):  In progress.   Nutritional Diagnosis:   NB-1.1 Food and nutrition-related knowledge deficit As related to Diabetes.  As evidenced by A1C 8.1%.    Intervention:  Nutrition counseling and diabetes education on diet,meal planning, CHO counting, portion sizes, signs/symptoms of hyper/hypoglycemia and treatment, prevention of complications and benefits of exercise for Dm and weight loss. Reviewed carb counting and meal planning and how to not over correct for low blood sugars. Importance of calling Dr. Dorris Fetch if BS are in 70's so he can adjust insulin.  Goals:  Follow MY Plate as instructed. Don't skip meals. Increase fresh fruits and vegetables. Lose 1 lb per werk. Call Dr. [name] about swelling in feet if doesn't get better in next 1-2 days. Avoid processed meats due to salt.  Teaching Method Utilized: See list Visual Auditory Hands on  Handouts given during visit include: Nutrition Guidelines for CKD My Plate  Barriers to learning/adherence to lifestyle change: none  Demonstrated degree of understanding via:  Teach Back   Monitoring/Evaluation:  Dietary intake, exercise, meal planning SBG, and body weight in 3 month(s).

## 2015-07-14 NOTE — Patient Instructions (Signed)

## 2015-07-14 NOTE — Patient Instructions (Signed)
Goals:  Follow MY Plate as instructed. Don't skip meals. Increase fresh fruits and vegetables. Lose 1 lb per werk. Call Dr. [name] about swelling in feet if doesn't get better in next 1-2 days. Avoid processed meats due to salt.

## 2015-07-18 ENCOUNTER — Ambulatory Visit (INDEPENDENT_AMBULATORY_CARE_PROVIDER_SITE_OTHER): Payer: Medicare Other | Admitting: *Deleted

## 2015-07-18 DIAGNOSIS — I482 Chronic atrial fibrillation: Secondary | ICD-10-CM

## 2015-07-18 DIAGNOSIS — Z5181 Encounter for therapeutic drug level monitoring: Secondary | ICD-10-CM

## 2015-07-18 DIAGNOSIS — I4821 Permanent atrial fibrillation: Secondary | ICD-10-CM

## 2015-07-18 LAB — POCT INR: INR: 2.7

## 2015-08-04 ENCOUNTER — Ambulatory Visit: Payer: Self-pay | Admitting: Gastroenterology

## 2015-08-07 ENCOUNTER — Ambulatory Visit: Payer: Self-pay | Admitting: Gastroenterology

## 2015-08-08 ENCOUNTER — Encounter: Payer: Self-pay | Admitting: "Endocrinology

## 2015-08-15 ENCOUNTER — Other Ambulatory Visit: Payer: Self-pay

## 2015-08-15 ENCOUNTER — Ambulatory Visit (INDEPENDENT_AMBULATORY_CARE_PROVIDER_SITE_OTHER): Payer: Medicare Other | Admitting: *Deleted

## 2015-08-15 DIAGNOSIS — I482 Chronic atrial fibrillation: Secondary | ICD-10-CM

## 2015-08-15 DIAGNOSIS — Z5181 Encounter for therapeutic drug level monitoring: Secondary | ICD-10-CM | POA: Diagnosis not present

## 2015-08-15 DIAGNOSIS — I4821 Permanent atrial fibrillation: Secondary | ICD-10-CM

## 2015-08-15 LAB — POCT INR: INR: 3.7

## 2015-08-15 MED ORDER — INSULIN LISPRO 100 UNIT/ML (KWIKPEN): 5.0000 [IU] | PEN_INJECTOR | Freq: Three times a day (TID) | SUBCUTANEOUS | Status: DC

## 2015-08-17 ENCOUNTER — Ambulatory Visit (INDEPENDENT_AMBULATORY_CARE_PROVIDER_SITE_OTHER): Payer: Medicare Other | Admitting: Gastroenterology

## 2015-08-17 ENCOUNTER — Encounter: Payer: Self-pay | Admitting: Gastroenterology

## 2015-08-17 VITALS — BP 149/78 | HR 72 | Temp 97.2°F | Ht 63.0 in | Wt 210.2 lb

## 2015-08-17 DIAGNOSIS — K219 Gastro-esophageal reflux disease without esophagitis: Secondary | ICD-10-CM | POA: Diagnosis not present

## 2015-08-17 DIAGNOSIS — K580 Irritable bowel syndrome with diarrhea: Secondary | ICD-10-CM | POA: Diagnosis not present

## 2015-08-17 MED ORDER — DICYCLOMINE HCL 10 MG PO CAPS: ORAL_CAPSULE | ORAL | Status: DC

## 2015-08-17 NOTE — Progress Notes (Signed)
Primary Care Physician: [name], MD  Primary Gastroenterologist:  Garfield Cornea, MD   Chief Complaint  Patient presents with  . Follow-up    HPI: [name] is a 81 y.o. female here for follow up. Last seen 03/2015. H/o Chronic diarrhea, abdominal pain, IBS, GERD, complicated diverticulitis requiring partial colectomy in the past. EGD and colonoscopy in September 2016. Small hiatal hernia, abnormal gastric mucosa with mild chronic gastritis on biopsy. No H pylori. Empirical dilation of esophagus for dysphagia. Surgical anastomosis of the colon noted at 2025 cm from the anal verge. Scattered residual distal colonic diverticula, distal 5 cm of the TI were normal. Random colon biopsies unremarkable.  Migraines constant for the past month. Only two days of relief. Tylenol around the clock. Upper teeth crumbling. Not on preventative migraine medication due to risk of side effects. Has not discussed with Dr. [name].  Intermittent fecal incontinence. Dicyclomine caused constipation before. Made feel bloated. Was taking qac/qhs before. Doesn't have any right now. Some mornings with 5-6 before lunch. Some nocturnal stools. GB in situ. Some brbpr at times. Drinking lots of water due to kidney dysfunction, follow by Dr. Hinda Lenis. Regurgitation with bending over. Taking pantoprazole bid. Strangled at night if comes up at night.   Looks for the pain off of her mortgage this year. Plans to get a lot of medical issues "caught up".   Current Outpatient Prescriptions  Medication Sig Dispense Refill  . acetaminophen (TYLENOL) 325 MG tablet Take 650 mg by mouth every 6 (six) hours as needed for pain.     [name] ALPRAZolam (XANAX) 1 MG tablet Take 0.5 mg by mouth 2 (two) times daily as needed for anxiety.     [name] amLODipine (NORVASC) 10 MG tablet Take 10 mg by mouth daily.    . cloNIDine (CATAPRES - DOSED IN MG/24 HR) 0.1 mg/24hr patch APPLY 1 PATCH ONCE A WEEK AS DIRECTED 4 patch 6  .  hydrochlorothiazide 50 MG tablet Take 50 mg by mouth daily.     . Insulin Glargine (LANTUS SOLOSTAR) 100 UNIT/ML Solostar Pen Inject 26 Units into the skin daily at 10 pm. 5 pen 2  . insulin lispro (HUMALOG KWIKPEN) 100 UNIT/ML KiwkPen Inject 0.05-0.11 mLs (5-11 Units total) into the skin 3 (three) times daily. 15 mL 2  . metoprolol tartrate (LOPRESSOR) 25 MG tablet TAKE 1 TABLET BY MOUTH TWICE DAILY 60 tablet 6  . nitroGLYCERIN (NITROSTAT) 0.4 MG SL tablet Place 1 tablet (0.4 mg total) under the tongue every 5 (five) minutes x 3 doses as needed for chest pain. If no relief after the 3 rd dose, proceed to the ED for an evaluation 90 tablet 3  . nystatin cream (MYCOSTATIN) Apply 1 application topically 2 (two) times daily. Continue for two days after resolution of symptoms. 60 g 0  . ondansetron (ZOFRAN-ODT) 4 MG disintegrating tablet Take 4 mg by mouth every 8 (eight) hours as needed. Nausea and Vomiting    . oxyCODONE (ROXICODONE) 15 MG immediate release tablet Take 15 mg by mouth every 6 (six) hours as needed. Pain    . pantoprazole (PROTONIX) 40 MG tablet Take 1 tablet (40 mg total) by mouth 2 (two) times daily before a meal. 60 tablet 5  . simvastatin (ZOCOR) 20 MG tablet Take 1 tablet by mouth daily.    . tizanidine (ZANAFLEX) 2 MG capsule Take 2 mg by mouth 3 (three) times daily as needed for muscle spasms.     [name]  UNABLE TO FIND Med Name: diclofenac-gabapentin-lidocaine- compounded lotion for back    . VICTOZA 18 MG/3ML SOPN INJECT 0.3 MLS (1.8 MG TOTAL) INTO THE SKIN DAILY. 9 mL 2  . warfarin (COUMADIN) 5 MG tablet Take 1/2 tablet daily except 1 tablet on Mondays, Wednesdays and Fridays or as directed by [facility] 30 tablet 3   No current facility-administered medications for this visit.    Allergies as of 08/17/2015 - Review Complete 08/17/2015  Allergen Reaction Noted  . Adhesive [tape] Itching 11/29/2011  . Demerol [meperidine]  02/22/2014  . Iohexol Nausea And Vomiting  03/08/2004  . Meperidine hcl Nausea And Vomiting   . Shellfish allergy Diarrhea and Nausea And Vomiting 08/03/2012  . Penicillins Nausea And Vomiting and Rash     ROS:  General: Negative for anorexia, weight loss, fever, chills, fatigue, weakness. ENT: Negative for hoarseness, difficulty swallowing , nasal congestion. CV: Negative for chest pain, angina, palpitations, dyspnea on exertion, peripheral edema.  Respiratory: Negative for dyspnea at rest, dyspnea on exertion, cough, sputum, wheezing.  GI: See history of present illness. GU:  Negative for dysuria, hematuria, urinary incontinence, urinary frequency, nocturnal urination.  Endo: Negative for unusual weight change.    Physical Examination:   BP 149/78 mmHg  Pulse 72  Temp(Src) 97.2 F (36.2 C) (Oral)  Ht 5\' 3"  (1.6 m)  Wt 210 lb 3.2 oz (95.346 kg)  BMI 37.24 kg/m2  General: Well-nourished, well-developed in no acute distress.  Eyes: No icterus. Mouth: Oropharyngeal mucosa moist and pink , no lesions erythema or exudate. Lungs: Clear to auscultation bilaterally.  Heart: Regular rate and rhythm, no murmurs rubs or gallops.  Abdomen: Bowel sounds are normal, large ventral diastases with herniation, easily reducible slight tenderness, no hepatosplenomegaly or masses, no abdominal bruits or hernia , no rebound or guarding.   Extremities: No lower extremity edema. No clubbing or deformities. Neuro: Alert and oriented x 4   Skin: Warm and dry, no jaundice.   Psych: Alert and cooperative, normal mood and affect.  Labs:  Lab Results  Component Value Date   CREATININE 1.36* 02/28/2014   BUN 26* 02/28/2014   NA 140 02/28/2014   K 5.4* 02/28/2014   CL 104 02/28/2014   CO2 23 02/28/2014   Lab Results  Component Value Date   HGBA1C 7.6 04/19/2014    Imaging Studies: No results found.   Impression/plan: 81 year old female with chronic diarrhea likely IVSD with extensive workup previously. Small doses of Imodium and  Bentyl have historically caused constipation. Trial of Xifaxan last year without any noted benefit. Patient will like to try low-dose Bentyl stating that she previously was taking 3-4 times daily. Other options are Viberzi for IBS-D. She wants to hold off on this for now. With regards to her GERD, overall fairly well-controlled. Requires pantoprazole twice a day.  I have encouraged her to discuss use of PPI with her nephrologist in light of the reports of concerns of association of PPI and renal disease. Would encourage her to try and reduce dose to once a day if tolerated. Trial of low dose dicyclomine for diarrhea. Return to the office in six months or sooner if needed.

## 2015-08-17 NOTE — Patient Instructions (Signed)
1. Continue pantoprazole 40 mg twice daily for now. 2. Start dicyclomine 10 mg every morning and at bedtime for diarrhea and abdominal pain. Hold for constipation. Prescription sent to [facility]. Call with a progress report in a couple of weeks and let me know how it's going. We can adjust medication accordingly. 3. Return to the office in 6 months or sooner if needed.

## 2015-08-18 NOTE — Progress Notes (Signed)
Please let patient know that she should try to reduce her PPI to once daily if tolerated. Try to get by with least amount of pantoprazole. She should discuss use of pantoprazole with Dr. Hinda Lenis at next [place]. There have been concerns about PPI association with renal disease but no studies showing causality yet.

## 2015-08-21 NOTE — Progress Notes (Signed)
cc'ed to pcp °

## 2015-08-22 NOTE — Progress Notes (Signed)
Tried to call pt- NA- LMOM 

## 2015-08-25 NOTE — Progress Notes (Signed)
Pt was made aware of the recommendations from [facility] and she is going to discuss her PPI with Dr. Hinda Lenis and she went on to say she had an ov with him in 09/2015.  Patient voiced understanding and the call was disconnected

## 2015-08-30 DIAGNOSIS — N611 Abscess of the breast and nipple: Secondary | ICD-10-CM | POA: Diagnosis not present

## 2015-09-05 ENCOUNTER — Encounter: Payer: Self-pay | Admitting: Cardiology

## 2015-09-05 ENCOUNTER — Ambulatory Visit (INDEPENDENT_AMBULATORY_CARE_PROVIDER_SITE_OTHER): Payer: Medicare Other | Admitting: *Deleted

## 2015-09-05 ENCOUNTER — Ambulatory Visit (INDEPENDENT_AMBULATORY_CARE_PROVIDER_SITE_OTHER): Payer: Medicare Other | Admitting: Cardiology

## 2015-09-05 VITALS — BP 138/72 | HR 65 | Ht 63.0 in | Wt 210.0 lb

## 2015-09-05 DIAGNOSIS — I482 Chronic atrial fibrillation, unspecified: Secondary | ICD-10-CM

## 2015-09-05 DIAGNOSIS — I10 Essential (primary) hypertension: Secondary | ICD-10-CM

## 2015-09-05 DIAGNOSIS — E785 Hyperlipidemia, unspecified: Secondary | ICD-10-CM | POA: Diagnosis not present

## 2015-09-05 DIAGNOSIS — I4821 Permanent atrial fibrillation: Secondary | ICD-10-CM

## 2015-09-05 DIAGNOSIS — Z5181 Encounter for therapeutic drug level monitoring: Secondary | ICD-10-CM | POA: Diagnosis not present

## 2015-09-05 LAB — POCT INR: INR: 2.4

## 2015-09-05 NOTE — Patient Instructions (Signed)
Your physician recommends that you continue on your current medications as directed. Please refer to the Current Medication list given to you today. Your physician recommends that you schedule a follow-up appointment in: 6 months. You will receive a reminder letter in the mail in about 4 months reminding you to call and schedule your appointment. If you don't receive this letter, please contact our office. 

## 2015-09-05 NOTE — Progress Notes (Signed)
Cardiology Office Note  Date: 09/05/2015   ID: [name], [name] 09/21/1935, MRN [id number]  PCP: [name] Neighbors, MD  Primary Cardiologist: [name] Lesches, MD   Chief Complaint  Patient presents with  . Atrial Fibrillation    History of Present Illness: [name] is a 81 y.o. female last seen in November 2016. She presents for a routine follow-up visit. Overall stable from a cardiac perspective, no palpitations reported.  She continues on Coumadin with follow-up in the anticoagulation clinic. She has had no unusual bleeding problems, INR was therapeutic today.  I reviewed her medications. She continues on Lopressor for heart rate control.  Past Medical History  Diagnosis Date  . Diarrhea   . Esophageal reflux   . OSA (obstructive sleep apnea)   . Fibromyalgia   . Nephrolithiasis   . Anxiety   . Mixed hyperlipidemia   . Essential hypertension, benign   . Colonoscopy causing post-procedural bleeding     Incomplete. by Dr. Gala [name] 06/11/99 due to fixed non-compliant colon precluded exam to 40 cm, she had subsequent colectomy for diverticulitis since that time.   . Chronic back pain   . Hiatal hernia     Recent EGD/TCS showed small hiatal hernia, normal apperaring tubular esophagus. s/p passage of a 54-french Maloney dilator, s/p biopsy esophageal mucosa, multiple fundal gland type gastric polyps. one large pedunculated polp with oozing, noted s/p clipping and snare polypectomy. Biopsies of the gastric mucosa taken given her symptoms in her elevated count. normal D1-D3, s/p biospy D2-D3.   [name] Hiatal hernia     all bx unremarkable. on TCS she had left-sided diverticula, evidence of prior segmental resection with anastomosis, multiple colonic polyps s/p multiple snare polypectomies, s/p segmental biopsy and stool sampling. she had multiple tubular adenomas. no microscopic/collagenous colitis. stool culture, c diff, O+P, lactoferrin were negative.   . Ureteral stent retained    Not retained. ureteral stent removal gross hematuria resolved 10/11. coumadin restarted.   . Type 2 diabetes mellitus ([facility])   . Permanent atrial fibrillation (H[facility])     Previous failed DCCV  . Asthma   . Ventral hernia     Current Outpatient Prescriptions  Medication Sig Dispense Refill  . acetaminophen (TYLENOL) 325 MG tablet Take 650 mg by mouth every 6 (six) hours as needed for pain.     [name] ALPRAZolam (XANAX) 1 MG tablet Take 0.5 mg by mouth 2 (two) times daily as needed for anxiety.     [name] amLODipine (NORVASC) 10 MG tablet Take 10 mg by mouth daily.    . cloNIDine (CATAPRES - DOSED IN MG/24 HR) 0.1 mg/24hr patch APPLY 1 PATCH ONCE A WEEK AS DIRECTED 4 patch 6  . dicyclomine (BENTYL) 10 MG capsule Take one in am and one at bedtime for diarrhea. Hold for constipation. 60 capsule 1  . doxycycline (VIBRAMYCIN) 100 MG capsule Take 100 mg by mouth 2 (two) times daily.    . hydrochlorothiazide 50 MG tablet Take 50 mg by mouth daily.     . Insulin Glargine (LANTUS SOLOSTAR) 100 UNIT/ML Solostar Pen Inject 26 Units into the skin daily at 10 pm. 5 pen 2  . insulin lispro (HUMALOG KWIKPEN) 100 UNIT/ML KiwkPen Inject 0.05-0.11 mLs (5-11 Units total) into the skin 3 (three) times daily. 15 mL 2  . metoprolol tartrate (LOPRESSOR) 25 MG tablet TAKE 1 TABLET BY MOUTH TWICE DAILY 60 tablet 6  . nitroGLYCERIN (NITROSTAT) 0.4 MG SL tablet Place 1 tablet (0.4 mg total)  under the tongue every 5 (five) minutes x 3 doses as needed for chest pain. If no relief after the 3 rd dose, proceed to the ED for an evaluation 90 tablet 3  . nystatin cream (MYCOSTATIN) Apply 1 application topically 2 (two) times daily. Continue for two days after resolution of symptoms. 60 g 0  . ondansetron (ZOFRAN-ODT) 4 MG disintegrating tablet Take 4 mg by mouth every 8 (eight) hours as needed. Nausea and Vomiting    . oxyCODONE (ROXICODONE) 15 MG immediate release tablet Take 15 mg by mouth every 6 (six) hours as needed. Pain    .  pantoprazole (PROTONIX) 40 MG tablet Take 1 tablet (40 mg total) by mouth 2 (two) times daily before a meal. 60 tablet 5  . simvastatin (ZOCOR) 20 MG tablet Take 1 tablet by mouth daily.    . tizanidine (ZANAFLEX) 2 MG capsule Take 2 mg by mouth 3 (three) times daily as needed for muscle spasms.     [name] UNABLE TO FIND Med Name: diclofenac-gabapentin-lidocaine- compounded lotion for back    . VICTOZA 18 MG/3ML SOPN INJECT 0.3 MLS (1.8 MG TOTAL) INTO THE SKIN DAILY. 9 mL 2  . warfarin (COUMADIN) 5 MG tablet Take 1/2 tablet daily except 1 tablet on Mondays, Wednesdays and Fridays or as directed by Coumadin Clinic 30 tablet 3   No current facility-administered medications for this visit.   Allergies:  Adhesive; Demerol; Iohexol; Meperidine hcl; Shellfish allergy; and Penicillins   Social History: The patient  reports that she has never smoked. She has never used smokeless tobacco. She reports that she does not drink alcohol or use illicit drugs.   ROS:  Please see the history of present illness. Otherwise, complete review of systems is positive for anterior chest wall abscess reported, now antibiotics under the direction of Dr. Nevada [name].  All other systems are reviewed and negative.   Physical Exam: VS:  BP 138/72 mmHg  Pulse 65  Ht 5\' 3"  (1.6 m)  Wt 210 lb (95.255 kg)  BMI 37.21 kg/m2  SpO2 98%, BMI Body mass index is 37.21 kg/(m^2).  Wt Readings from Last 3 Encounters:  09/05/15 210 lb (95.255 kg)  08/17/15 210 lb 3.2 oz (95.346 kg)  07/14/15 209 lb (94.802 kg)    Overweight woman in no acute distress.  HEENT: Conjunctiva and lids normal, oropharynx clear.  Neck: Supple, no elevated JVP or carotid bruits, no thyromegaly.  Lungs: Clear to auscultation, nonlabored breathing at rest.  Thorax: Large carbuncle anterior chest wall, patient has covered, states that there is some blistering around it, also significant tenderness and erythema. Cardiac: Irregularly irregular, no S3 or significant  systolic murmur.  Abdomen: Soft, nontender,bowel sounds present.  Extremities: Trace edema, distal pulses 2+.   ECG: I personally reviewed the tracing from 08/16/2014 which showed atrial fibrillation with nonspecific T-wave changes and low voltage.  Recent Labwork:  January 2017: Cholesterol 120, triglycerides 143, HDL 24, LDL 67  Other Studies Reviewed Today:  Echocardiogram 11/16/2009 [facility]): Mild to moderate LVH with LVEF 55-60%, mild to moderate left atrial enlargement, MAC with mild mitral regurgitation, mildly thickened aortic leaflets, trace tricuspid regurgitation, trivial pericardial effusion.  Assessment and Plan:  1. Chronic atrial fibrillation. Continue strategy of heart rate control and anticoagulation. She is on Lopressor and Coumadin. No palpitations.  2. Hyperlipidemia, continues on statin therapy. LDL was 67 in January.  3. Patient reported anterior chest wall abscess with prior history of cyst in this area. She is on doxycycline  this time. Reports no improvement, still with tenderness, the region has not drained. I recommended that she contact Dr. Juel Burrow office today about referral to a surgeon for I&D. She could come off Coumadin for this if needed.  Current medicines were reviewed with the patient today.  Disposition: FU with me in 6 months.   Signed, [name], MD, [facility] 09/05/2015 9:33 AM    [facility] [place], [place], [place] [address] Phone: [phone]; Fax: [phone]

## 2015-09-08 DIAGNOSIS — G4701 Insomnia due to medical condition: Secondary | ICD-10-CM | POA: Diagnosis not present

## 2015-09-08 DIAGNOSIS — E119 Type 2 diabetes mellitus without complications: Secondary | ICD-10-CM | POA: Diagnosis not present

## 2015-09-08 DIAGNOSIS — M545 Low back pain: Secondary | ICD-10-CM | POA: Diagnosis not present

## 2015-09-08 DIAGNOSIS — Z79899 Other long term (current) drug therapy: Secondary | ICD-10-CM | POA: Diagnosis not present

## 2015-09-08 DIAGNOSIS — M25511 Pain in right shoulder: Secondary | ICD-10-CM | POA: Diagnosis not present

## 2015-09-08 DIAGNOSIS — E785 Hyperlipidemia, unspecified: Secondary | ICD-10-CM | POA: Diagnosis not present

## 2015-09-08 DIAGNOSIS — G8929 Other chronic pain: Secondary | ICD-10-CM | POA: Diagnosis not present

## 2015-09-08 DIAGNOSIS — G43909 Migraine, unspecified, not intractable, without status migrainosus: Secondary | ICD-10-CM | POA: Diagnosis not present

## 2015-09-08 DIAGNOSIS — M797 Fibromyalgia: Secondary | ICD-10-CM | POA: Diagnosis not present

## 2015-09-08 DIAGNOSIS — K219 Gastro-esophageal reflux disease without esophagitis: Secondary | ICD-10-CM | POA: Diagnosis not present

## 2015-09-08 DIAGNOSIS — I10 Essential (primary) hypertension: Secondary | ICD-10-CM | POA: Diagnosis not present

## 2015-09-08 DIAGNOSIS — M25552 Pain in left hip: Secondary | ICD-10-CM | POA: Diagnosis not present

## 2015-09-12 ENCOUNTER — Other Ambulatory Visit: Payer: Self-pay | Admitting: *Deleted

## 2015-09-12 DIAGNOSIS — N611 Abscess of the breast and nipple: Secondary | ICD-10-CM | POA: Diagnosis not present

## 2015-09-12 MED ORDER — METOPROLOL TARTRATE 25 MG PO TABS: 25.0000 mg | ORAL_TABLET | Freq: Two times a day (BID) | ORAL | Status: DC

## 2015-09-28 DIAGNOSIS — J00 Acute nasopharyngitis [common cold]: Secondary | ICD-10-CM | POA: Diagnosis not present

## 2015-10-03 ENCOUNTER — Other Ambulatory Visit (HOSPITAL_COMMUNITY): Payer: Self-pay | Admitting: Internal Medicine

## 2015-10-03 ENCOUNTER — Ambulatory Visit (HOSPITAL_COMMUNITY)
Admission: RE | Admit: 2015-10-03 | Discharge: 2015-10-03 | Disposition: A | Discharge status: Home / Self Care | Payer: Medicare Other | Source: Ambulatory Visit | Attending: Internal Medicine | Admitting: Internal Medicine

## 2015-10-03 DIAGNOSIS — I700 Atherosclerosis of aorta: Secondary | ICD-10-CM | POA: Diagnosis not present

## 2015-10-03 DIAGNOSIS — R05 Cough: Secondary | ICD-10-CM | POA: Insufficient documentation

## 2015-10-03 DIAGNOSIS — I6522 Occlusion and stenosis of left carotid artery: Secondary | ICD-10-CM | POA: Insufficient documentation

## 2015-10-03 DIAGNOSIS — R059 Cough, unspecified: Secondary | ICD-10-CM

## 2015-10-10 ENCOUNTER — Other Ambulatory Visit: Payer: Self-pay | Admitting: Cardiology

## 2015-10-10 ENCOUNTER — Other Ambulatory Visit: Payer: Self-pay | Admitting: "Endocrinology

## 2015-10-11 ENCOUNTER — Other Ambulatory Visit: Payer: Self-pay | Admitting: "Endocrinology

## 2015-10-11 DIAGNOSIS — N183 Chronic kidney disease, stage 3 (moderate): Secondary | ICD-10-CM | POA: Diagnosis not present

## 2015-10-11 DIAGNOSIS — E1122 Type 2 diabetes mellitus with diabetic chronic kidney disease: Secondary | ICD-10-CM | POA: Diagnosis not present

## 2015-10-11 LAB — BASIC METABOLIC PANEL
BUN: 26 mg/dL — AB (ref 7–25)
CO2: 28 mmol/L (ref 20–31)
CREATININE: 1.38 mg/dL — AB (ref 0.60–0.93)
Calcium: 9.2 mg/dL (ref 8.6–10.4)
Chloride: 101 mmol/L (ref 98–110)
Glucose, Bld: 200 mg/dL — ABNORMAL HIGH (ref 65–99)
POTASSIUM: 4.7 mmol/L (ref 3.5–5.3)
Sodium: 137 mmol/L (ref 135–146)

## 2015-10-11 LAB — HEMOGLOBIN A1C
HEMOGLOBIN A1C: 7.9 % — AB (ref <5.7)
Hemoglobin A1C: 7.9
MEAN PLASMA GLUCOSE: 180 mg/dL

## 2015-10-12 ENCOUNTER — Ambulatory Visit (INDEPENDENT_AMBULATORY_CARE_PROVIDER_SITE_OTHER): Payer: Medicare Other | Admitting: *Deleted

## 2015-10-12 DIAGNOSIS — E119 Type 2 diabetes mellitus without complications: Secondary | ICD-10-CM | POA: Diagnosis not present

## 2015-10-12 DIAGNOSIS — K219 Gastro-esophageal reflux disease without esophagitis: Secondary | ICD-10-CM | POA: Diagnosis not present

## 2015-10-12 DIAGNOSIS — M797 Fibromyalgia: Secondary | ICD-10-CM | POA: Diagnosis not present

## 2015-10-12 DIAGNOSIS — E785 Hyperlipidemia, unspecified: Secondary | ICD-10-CM | POA: Diagnosis not present

## 2015-10-12 DIAGNOSIS — G8929 Other chronic pain: Secondary | ICD-10-CM | POA: Diagnosis not present

## 2015-10-12 DIAGNOSIS — M545 Low back pain: Secondary | ICD-10-CM | POA: Diagnosis not present

## 2015-10-12 DIAGNOSIS — Z5181 Encounter for therapeutic drug level monitoring: Secondary | ICD-10-CM

## 2015-10-12 DIAGNOSIS — M25552 Pain in left hip: Secondary | ICD-10-CM | POA: Diagnosis not present

## 2015-10-12 DIAGNOSIS — I10 Essential (primary) hypertension: Secondary | ICD-10-CM | POA: Diagnosis not present

## 2015-10-12 DIAGNOSIS — M25511 Pain in right shoulder: Secondary | ICD-10-CM | POA: Diagnosis not present

## 2015-10-12 DIAGNOSIS — I482 Chronic atrial fibrillation: Secondary | ICD-10-CM | POA: Diagnosis not present

## 2015-10-12 DIAGNOSIS — I4821 Permanent atrial fibrillation: Secondary | ICD-10-CM

## 2015-10-12 DIAGNOSIS — G43909 Migraine, unspecified, not intractable, without status migrainosus: Secondary | ICD-10-CM | POA: Diagnosis not present

## 2015-10-12 DIAGNOSIS — Z79899 Other long term (current) drug therapy: Secondary | ICD-10-CM | POA: Diagnosis not present

## 2015-10-12 DIAGNOSIS — G4701 Insomnia due to medical condition: Secondary | ICD-10-CM | POA: Diagnosis not present

## 2015-10-12 LAB — POCT INR: INR: 3.9

## 2015-10-18 ENCOUNTER — Ambulatory Visit: Payer: Self-pay | Admitting: "Endocrinology

## 2015-10-31 ENCOUNTER — Ambulatory Visit (INDEPENDENT_AMBULATORY_CARE_PROVIDER_SITE_OTHER): Payer: Medicare Other | Admitting: "Endocrinology

## 2015-10-31 ENCOUNTER — Encounter: Payer: Self-pay | Admitting: "Endocrinology

## 2015-10-31 VITALS — BP 142/78 | HR 70 | Resp 18 | Ht 63.0 in | Wt 204.0 lb

## 2015-10-31 DIAGNOSIS — I10 Essential (primary) hypertension: Secondary | ICD-10-CM

## 2015-10-31 DIAGNOSIS — N183 Chronic kidney disease, stage 3 unspecified: Secondary | ICD-10-CM

## 2015-10-31 DIAGNOSIS — E1122 Type 2 diabetes mellitus with diabetic chronic kidney disease: Secondary | ICD-10-CM

## 2015-10-31 DIAGNOSIS — E785 Hyperlipidemia, unspecified: Secondary | ICD-10-CM

## 2015-10-31 NOTE — Progress Notes (Signed)
Subjective:    Patient ID: [name], female    DOB: 1934-12-08, PCP [name], MD   Past Medical History:  Diagnosis Date  . Anxiety   . Asthma   . Chronic back pain   . Colonoscopy causing post-procedural bleeding    Incomplete. by Dr. [name] 06/11/99 due to fixed non-compliant colon precluded exam to 40 cm, she had subsequent colectomy for diverticulitis since that time.   . Diarrhea   . Esophageal reflux   . Essential hypertension, benign   . Fibromyalgia   . Hiatal hernia    Recent EGD/TCS showed small hiatal hernia, normal apperaring tubular esophagus. s/p passage of a 54-french Maloney dilator, s/p biopsy esophageal mucosa, multiple fundal gland type gastric polyps. one large pedunculated polp with oozing, noted s/p clipping and snare polypectomy. Biopsies of the gastric mucosa taken given her symptoms in her elevated count. normal D1-D3, s/p biospy D2-D3.   [name] Hiatal hernia    all bx unremarkable. on TCS she had left-sided diverticula, evidence of prior segmental resection with anastomosis, multiple colonic polyps s/p multiple snare polypectomies, s/p segmental biopsy and stool sampling. she had multiple tubular adenomas. no microscopic/collagenous colitis. stool culture, c diff, O+P, lactoferrin were negative.   . Mixed hyperlipidemia   . Nephrolithiasis   . OSA (obstructive sleep apnea)   . Permanent atrial fibrillation (HCC)    Previous failed DCCV  . Type 2 diabetes mellitus ([place])   . Ureteral stent retained    Not retained. ureteral stent removal gross hematuria resolved 10/11. coumadin restarted.   . Ventral hernia    Past Surgical History:  Procedure Laterality Date  . BREAST LUMPECTOMY     benign cyst  . CATARACT EXTRACTION W/PHACO Left 08/17/2012   Procedure: CATARACT EXTRACTION PHACO AND INTRAOCULAR LENS PLACEMENT (IOC);  Surgeon: [name], MD;  Location: [facility];  Service: Ophthalmology;  Laterality: Left;  CDE:18.73  . COLECTOMY     2005. Dr.  Geoffry Paradise for diverticulitis  . COLONOSCOPY  02/22/09   normal/left-sided diverticula/multiple colonic polyp, adenomatous  . COLONOSCOPY  12/16/2011   colonic polyps-treated as described above. Status postprior sigmoid resection. adenomatous. next TCS 12/2014  . COLONOSCOPY N/A 12/28/2014   Status post prior segmental resection. Few residual colonic diverticula- status post segmental biopsy negative random colon biopsies  . ESOPHAGEAL DILATION N/A 12/28/2014   Procedure: ESOPHAGEAL DILATION;  Surgeon: [name], MD;  Location: [facility];  Service: Endoscopy;  Laterality: N/A;  . ESOPHAGOGASTRODUODENOSCOPY  02/22/09   normal s/p dilator;small HH/one large pedunculates polyp  s/p clipping, hyperplastic  . ESOPHAGOGASTRODUODENOSCOPY  12/16/2011   [name]: small hiatal hernia. Hyperplastic apperating polyps. Status post [name] dilation as described above.  . ESOPHAGOGASTRODUODENOSCOPY N/A 12/28/2014   RMR: Normal-appearing esophagus status post passage of a maloney dilator. Hiatal hernia. abnormal gastic mucosa of uncertain significance status post gastric biopsy with mild chronic gastritis, no H pylori.   . TONSILLECTOMY    . TOTAL ABDOMINAL HYSTERECTOMY     Social History   Social History  . Marital status: Widowed    Spouse name: N/A  . Number of children: N/A  . Years of education: N/A   Social History Main Topics  . Smoking status: Never Smoker  . Smokeless tobacco: Never Used     Comment: tobacco use - no  . Alcohol use No  . Drug use: No  . Sexual activity: Not Asked   Other Topics Concern  . None   Social History  Narrative  . None   Outpatient Encounter Prescriptions as of 10/31/2015  Medication Sig  . acetaminophen (TYLENOL) 325 MG tablet Take 650 mg by mouth every 6 (six) hours as needed for pain.   [name] ALPRAZolam (XANAX) 1 MG tablet Take 0.5 mg by mouth 2 (two) times daily as needed for anxiety.   [name] amLODipine (NORVASC) 10 MG tablet Take 10 mg by mouth daily.  .  cloNIDine (CATAPRES - DOSED IN MG/24 HR) 0.1 mg/24hr patch APPLY 1 PATCH ONCE A WEEK AS DIRECTED  . dicyclomine (BENTYL) 10 MG capsule Take one in am and one at bedtime for diarrhea. Hold for constipation.  . hydrochlorothiazide 50 MG tablet Take 50 mg by mouth daily.   . insulin lispro (HUMALOG KWIKPEN) 100 UNIT/ML KiwkPen Inject 0.05-0.11 mLs (5-11 Units total) into the skin 3 (three) times daily.  [name] LANTUS SOLOSTAR 100 UNIT/ML Solostar Pen INJECT 26 UNITS INTO THE SKIN DAILY AT 10 PM.  . metoprolol tartrate (LOPRESSOR) 25 MG tablet Take 1 tablet (25 mg total) by mouth 2 (two) times daily.  . nitroGLYCERIN (NITROSTAT) 0.4 MG SL tablet Place 1 tablet (0.4 mg total) under the tongue every 5 (five) minutes x 3 doses as needed for chest pain. If no relief after the 3 rd dose, proceed to the ED for an evaluation  . nystatin cream (MYCOSTATIN) Apply 1 application topically 2 (two) times daily. Continue for two days after resolution of symptoms.  . ondansetron (ZOFRAN-ODT) 4 MG disintegrating tablet Take 4 mg by mouth every 8 (eight) hours as needed. Nausea and Vomiting  . oxyCODONE (ROXICODONE) 15 MG immediate release tablet Take 15 mg by mouth every 6 (six) hours as needed. Pain  . pantoprazole (PROTONIX) 40 MG tablet Take 1 tablet (40 mg total) by mouth 2 (two) times daily before a meal.  . simvastatin (ZOCOR) 20 MG tablet Take 1 tablet by mouth daily.  . tizanidine (ZANAFLEX) 2 MG capsule Take 2 mg by mouth 3 (three) times daily as needed for muscle spasms.   [name] UNABLE TO FIND Med Name: diclofenac-gabapentin-lidocaine- compounded lotion for back  . VICTOZA 18 MG/3ML SOPN INJECT 0.3 MLS (1.8 MG TOTAL) INTO THE SKIN DAILY  . warfarin (COUMADIN) 5 MG tablet TAKE 1/2 TABLET BY MOUTH EVERY DAY , EXCEPT TAKE 1 TABLET BY MOUTH ON MONDAY, WEDNESDAY, AND FRIDAY OR AS DIRECTED BY COUMADIN CLINIC  . [DISCONTINUED] doxycycline (VIBRAMYCIN) 100 MG capsule Take 100 mg by mouth 2 (two) times daily.   No  facility-administered encounter medications on file as of 10/31/2015.    ALLERGIES: Allergies  Allergen Reactions  . Adhesive [Tape] Itching    Pulls skin off.   . Demerol [Meperidine]     Causes Vomiting  . Iohexol Nausea And Vomiting  . Meperidine Hcl Nausea And Vomiting  . Shellfish Allergy Diarrhea and Nausea And Vomiting  . Penicillins Nausea And Vomiting and Rash   VACCINATION STATUS:  There is no immunization history on file for this patient.  Diabetes  She presents for her follow-up diabetic visit. She has type 2 diabetes mellitus. Onset time: She was diagnosed at approximate age of 27 years. Her disease course has been worsening. There are no hypoglycemic associated symptoms. Pertinent negatives for hypoglycemia include no confusion, headaches, pallor or seizures. (She has a few random mild hypoglycemia episodes.) There are no diabetic associated symptoms. Pertinent negatives for diabetes include no chest pain, no polydipsia, no polyphagia and no polyuria. There are no hypoglycemic complications. Symptoms are worsening.  Diabetic complications include nephropathy. Risk factors for coronary artery disease include diabetes mellitus, dyslipidemia, hypertension, obesity and sedentary lifestyle. Current diabetic treatment includes intensive insulin program. She is compliant with treatment most of the time. Her weight is stable. She is following a generally unhealthy diet. She has had a previous visit with a dietitian. Her home blood glucose trend is decreasing steadily. Her breakfast blood glucose range is generally 130-140 mg/dl. Her lunch blood glucose range is generally 130-140 mg/dl. Her dinner blood glucose range is generally 130-140 mg/dl. Her overall blood glucose range is 130-140 mg/dl. Eye exam is current.  Hyperlipidemia  This is a chronic problem. The current episode started more than 1 year ago. Exacerbating diseases include diabetes. Pertinent negatives include no chest pain,  myalgias or shortness of breath. Current antihyperlipidemic treatment includes statins. Risk factors for coronary artery disease include diabetes mellitus, dyslipidemia, hypertension, obesity and a sedentary lifestyle.  Hypertension  This is a chronic problem. The current episode started more than 1 year ago. Pertinent negatives include no chest pain, headaches, palpitations or shortness of breath. Risk factors for coronary artery disease include diabetes mellitus, dyslipidemia, obesity and sedentary lifestyle.     Review of Systems  Constitutional: Negative for chills, fever and unexpected weight change.  HENT: Negative for trouble swallowing and voice change.   Eyes: Negative for visual disturbance.  Respiratory: Negative for cough, shortness of breath and wheezing.   Cardiovascular: Negative for chest pain, palpitations and leg swelling.  Gastrointestinal: Negative for diarrhea, nausea and vomiting.  Endocrine: Negative for cold intolerance, heat intolerance, polydipsia, polyphagia and polyuria.  Musculoskeletal: Negative for arthralgias and myalgias.  Skin: Negative for color change, pallor, rash and wound.  Neurological: Negative for seizures and headaches.  Psychiatric/Behavioral: Negative for confusion and suicidal ideas.    Objective:    BP (!) 142/78 (BP Location: Left Arm, Patient Position: Sitting, Cuff Size: Large)   Pulse 70   Resp 18   Ht 5\' 3"  (1.6 m)   Wt 204 lb (92.5 kg)   SpO2 97%   BMI 36.14 kg/m   Wt Readings from Last 3 Encounters:  10/31/15 204 lb (92.5 kg)  09/05/15 210 lb (95.3 kg)  08/17/15 210 lb 3.2 oz (95.3 kg)    Physical Exam  Constitutional: She is oriented to person, place, and time. She appears well-developed.  HENT:  Head: Normocephalic and atraumatic.  Eyes: EOM are normal.  Neck: Normal range of motion. Neck supple. No tracheal deviation present. No thyromegaly present.  Cardiovascular: Normal rate and regular rhythm.   Pulmonary/Chest:  Effort normal and breath sounds normal.  Abdominal: Soft. Bowel sounds are normal. There is no tenderness. There is no guarding.  Musculoskeletal: Normal range of motion. She exhibits no edema.  Neurological: She is alert and oriented to person, place, and time. She has normal reflexes. No cranial nerve deficit. Coordination normal.  Skin: Skin is warm and dry. No rash noted. No erythema. No pallor.  Psychiatric: She has a normal mood and affect. Judgment normal.     CMP ( most recent) CMP     Component Value Date/Time   NA 140 02/28/2014 1050   K 5.4 (H) 02/28/2014 1050   CL 104 02/28/2014 1050   CO2 23 02/28/2014 1050   GLUCOSE 128 (H) 02/28/2014 1050   BUN 26 (H) 02/28/2014 1050   CREATININE 1.36 (H) 02/28/2014 1050   CALCIUM 9.5 02/28/2014 1050   PROT 6.9 02/28/2014 1050   ALBUMIN 3.9 02/28/2014 1050  AST 21 02/28/2014 1050   ALT 15 02/28/2014 1050   ALKPHOS 87 02/28/2014 1050   BILITOT 0.5 02/28/2014 1050   GFRNONAA 34 (L) 08/11/2012 1048   GFRAA 40 (L) 08/11/2012 1048     Diabetic Labs (most recent): Lab Results  Component Value Date   HGBA1C 7.9 10/11/2015   HGBA1C 7.6 04/19/2014   HGBA1C (H) 07/11/2009    8.9 (NOTE) The ADA recommends the following therapeutic goal for glycemic control related to Hgb A1c measurement: Goal of therapy: <6.5 Hgb A1c  Reference: American Diabetes Association: Clinical Practice Recommendations 2010, Diabetes Care, 2010, 33: (Suppl  1).     Lipid Panel ( most recent) Lipid Panel     Component Value Date/Time   CHOL  07/11/2009 2342    115        ATP III CLASSIFICATION:  <200     mg/dL   Desirable  200-239  mg/dL   Borderline High  >=240    mg/dL   High          TRIG 174 (H) 07/11/2009 2342   HDL 25 (L) 07/11/2009 2342   CHOLHDL 4.6 07/11/2009 2342   VLDL 35 07/11/2009 2342   LDLCALC  07/11/2009 2342    55        Total Cholesterol/HDL:CHD Risk Coronary Heart Disease Risk Table                     Men   Women  1/2  Average Risk   3.4   3.3  Average Risk       5.0   4.4  2 X Average Risk   9.6   7.1  3 X Average Risk  23.4   11.0        Use the calculated Patient Ratio above and the CHD Risk Table to determine the patient's CHD Risk.        ATP III CLASSIFICATION (LDL):  <100     mg/dL   Optimal  100-129  mg/dL   Near or Above                    Optimal  130-159  mg/dL   Borderline  160-189  mg/dL   High  >190     mg/dL   Very High      Assessment & Plan:   1. Diabetes mellitus with stage 3 chronic kidney disease (HCC)  - patient remains at a high risk for more acute and chronic complications of diabetes which include CAD, CVA, CKD, retinopathy, and neuropathy. These are all discussed in detail with the patient.  Patient came with controlled glucose profile, and  recent labs Show A1c of 7.9 %.  - Glucose logs and insulin administration records pertaining to this visit,  to be scanned into patient's records.  Recent labs reviewed.   - I have re-counseled the patient on diet management and weight loss  by adopting a carbohydrate restricted / protein [name]  Diet.  - Suggestion is made for patient to avoid simple carbohydrates   from their diet including Cakes , Desserts, Ice Cream,  Soda (  diet and regular) , Sweet Tea , Candies,  Chips, Cookies, Artificial Sweeteners,   and "Sugar-free" Products .  This will help patient to have stable blood glucose profile and potentially avoid unintended  Weight gain.  - Patient is advised to stick to a routine mealtimes to eat 3 meals  a day and avoid unnecessary snacks (  to snack only to correct hypoglycemia).  - The patient  has been  scheduled with Jearld Fenton, RDN, CDE for individualized DM education.  - I have approached patient with the following individualized plan to manage diabetes and patient agrees.  - I will continue Lantus  26 units QHS, continue Humalog 5 units TIDAC plus SSI , - continue Victoza 1.8mg  daily . -Side effects of Victoza  is discussed with the patient.  -she is on Simvastatin 20 mg po qhs. -Patient is not a candidate for metformin and SGLT2 inhibitors due to CKD.  - Patient specific target  for A1c; LDL, HDL, Triglycerides, and  Waist Circumference were discussed in detail.  2) BP/HTN: Controlled. Continue current medications.  3) Lipids/HPL:  continue statins. 4)  Weight/Diet: CDE consult in progress, exercise, and carbohydrates information provided.  5) Chronic Care/Health Maintenance:  -Patient is on  Statin medications and encouraged to continue to follow up with Ophthalmology, Podiatrist at least yearly or according to recommendations, and advised to  stay away from smoking. I have recommended yearly flu vaccine and pneumonia vaccination at least every 5 years; moderate intensity exercise for up to 150 minutes weekly; and  sleep for at least 7 hours a day.  - 25 minutes of time was spent on the care of this patient , 50% of which was applied for counseling on diabetes complications and their preventions.  - I advised patient to maintain close follow up with [name], MD for primary care needs.  Patient is asked to bring meter and  blood glucose logs during their next visit.   Follow up plan: -Return in about 3 months (around 01/31/2016) for follow up with pre-visit labs, meter, and logs.  Glade Lloyd, MD Phone: [phone]  Fax: [phone]   10/31/2015, 11:47 AM

## 2015-11-02 ENCOUNTER — Ambulatory Visit (INDEPENDENT_AMBULATORY_CARE_PROVIDER_SITE_OTHER): Payer: Medicare Other | Admitting: *Deleted

## 2015-11-02 DIAGNOSIS — I4821 Permanent atrial fibrillation: Secondary | ICD-10-CM

## 2015-11-02 DIAGNOSIS — I482 Chronic atrial fibrillation: Secondary | ICD-10-CM

## 2015-11-02 DIAGNOSIS — Z5181 Encounter for therapeutic drug level monitoring: Secondary | ICD-10-CM | POA: Diagnosis not present

## 2015-11-02 LAB — POCT INR: INR: 2.3

## 2015-11-03 DIAGNOSIS — E1122 Type 2 diabetes mellitus with diabetic chronic kidney disease: Secondary | ICD-10-CM | POA: Diagnosis not present

## 2015-11-06 DIAGNOSIS — I10 Essential (primary) hypertension: Secondary | ICD-10-CM | POA: Diagnosis not present

## 2015-11-06 DIAGNOSIS — E782 Mixed hyperlipidemia: Secondary | ICD-10-CM | POA: Diagnosis not present

## 2015-11-06 DIAGNOSIS — K219 Gastro-esophageal reflux disease without esophagitis: Secondary | ICD-10-CM | POA: Diagnosis not present

## 2015-11-06 DIAGNOSIS — N183 Chronic kidney disease, stage 3 (moderate): Secondary | ICD-10-CM | POA: Diagnosis not present

## 2015-11-06 DIAGNOSIS — E1122 Type 2 diabetes mellitus with diabetic chronic kidney disease: Secondary | ICD-10-CM | POA: Diagnosis not present

## 2015-11-06 DIAGNOSIS — I482 Chronic atrial fibrillation: Secondary | ICD-10-CM | POA: Diagnosis not present

## 2015-11-09 ENCOUNTER — Ambulatory Visit: Payer: Self-pay | Admitting: Nutrition

## 2015-11-09 ENCOUNTER — Other Ambulatory Visit: Payer: Self-pay | Admitting: Gastroenterology

## 2015-11-13 ENCOUNTER — Other Ambulatory Visit: Payer: Self-pay

## 2015-11-13 ENCOUNTER — Encounter: Payer: Self-pay | Admitting: "Endocrinology

## 2015-11-13 NOTE — Telephone Encounter (Signed)
Opened in error

## 2015-11-30 ENCOUNTER — Ambulatory Visit (INDEPENDENT_AMBULATORY_CARE_PROVIDER_SITE_OTHER): Payer: Medicare Other | Admitting: *Deleted

## 2015-11-30 DIAGNOSIS — I4821 Permanent atrial fibrillation: Secondary | ICD-10-CM

## 2015-11-30 DIAGNOSIS — I482 Chronic atrial fibrillation: Secondary | ICD-10-CM

## 2015-11-30 DIAGNOSIS — Z5181 Encounter for therapeutic drug level monitoring: Secondary | ICD-10-CM

## 2015-11-30 LAB — POCT INR: INR: 2.7

## 2015-12-11 ENCOUNTER — Other Ambulatory Visit: Payer: Self-pay | Admitting: "Endocrinology

## 2015-12-14 DIAGNOSIS — Z79899 Other long term (current) drug therapy: Secondary | ICD-10-CM | POA: Diagnosis not present

## 2015-12-14 DIAGNOSIS — G4701 Insomnia due to medical condition: Secondary | ICD-10-CM | POA: Diagnosis not present

## 2015-12-14 DIAGNOSIS — M25511 Pain in right shoulder: Secondary | ICD-10-CM | POA: Diagnosis not present

## 2015-12-14 DIAGNOSIS — I10 Essential (primary) hypertension: Secondary | ICD-10-CM | POA: Diagnosis not present

## 2015-12-14 DIAGNOSIS — M25552 Pain in left hip: Secondary | ICD-10-CM | POA: Diagnosis not present

## 2015-12-14 DIAGNOSIS — Z79891 Long term (current) use of opiate analgesic: Secondary | ICD-10-CM | POA: Diagnosis not present

## 2015-12-14 DIAGNOSIS — M797 Fibromyalgia: Secondary | ICD-10-CM | POA: Diagnosis not present

## 2015-12-14 DIAGNOSIS — M545 Low back pain: Secondary | ICD-10-CM | POA: Diagnosis not present

## 2015-12-14 DIAGNOSIS — G43909 Migraine, unspecified, not intractable, without status migrainosus: Secondary | ICD-10-CM | POA: Diagnosis not present

## 2015-12-14 DIAGNOSIS — K219 Gastro-esophageal reflux disease without esophagitis: Secondary | ICD-10-CM | POA: Diagnosis not present

## 2015-12-14 DIAGNOSIS — E785 Hyperlipidemia, unspecified: Secondary | ICD-10-CM | POA: Diagnosis not present

## 2015-12-14 DIAGNOSIS — E119 Type 2 diabetes mellitus without complications: Secondary | ICD-10-CM | POA: Diagnosis not present

## 2015-12-14 DIAGNOSIS — G8929 Other chronic pain: Secondary | ICD-10-CM | POA: Diagnosis not present

## 2015-12-28 ENCOUNTER — Ambulatory Visit (INDEPENDENT_AMBULATORY_CARE_PROVIDER_SITE_OTHER): Payer: Medicare Other | Admitting: *Deleted

## 2015-12-28 DIAGNOSIS — Z5181 Encounter for therapeutic drug level monitoring: Secondary | ICD-10-CM | POA: Diagnosis not present

## 2015-12-28 DIAGNOSIS — E103293 Type 1 diabetes mellitus with mild nonproliferative diabetic retinopathy without macular edema, bilateral: Secondary | ICD-10-CM | POA: Diagnosis not present

## 2015-12-28 DIAGNOSIS — E10319 Type 1 diabetes mellitus with unspecified diabetic retinopathy without macular edema: Secondary | ICD-10-CM | POA: Diagnosis not present

## 2015-12-28 DIAGNOSIS — I4821 Permanent atrial fibrillation: Secondary | ICD-10-CM

## 2015-12-28 DIAGNOSIS — I482 Chronic atrial fibrillation: Secondary | ICD-10-CM | POA: Diagnosis not present

## 2015-12-28 DIAGNOSIS — H35371 Puckering of macula, right eye: Secondary | ICD-10-CM | POA: Diagnosis not present

## 2015-12-28 DIAGNOSIS — H35372 Puckering of macula, left eye: Secondary | ICD-10-CM | POA: Diagnosis not present

## 2015-12-28 LAB — POCT INR: INR: 2.2

## 2016-01-08 ENCOUNTER — Other Ambulatory Visit: Payer: Self-pay | Admitting: "Endocrinology

## 2016-01-22 ENCOUNTER — Encounter: Payer: Self-pay | Admitting: Internal Medicine

## 2016-01-30 ENCOUNTER — Other Ambulatory Visit: Payer: Self-pay | Admitting: "Endocrinology

## 2016-01-30 DIAGNOSIS — E1122 Type 2 diabetes mellitus with diabetic chronic kidney disease: Secondary | ICD-10-CM | POA: Diagnosis not present

## 2016-01-30 DIAGNOSIS — N183 Chronic kidney disease, stage 3 (moderate): Secondary | ICD-10-CM | POA: Diagnosis not present

## 2016-01-30 LAB — COMPLETE METABOLIC PANEL WITH GFR
ALBUMIN: 3.8 g/dL (ref 3.6–5.1)
ALK PHOS: 65 U/L (ref 33–130)
ALT: 11 U/L (ref 6–29)
AST: 17 U/L (ref 10–35)
BUN: 21 mg/dL (ref 7–25)
CALCIUM: 8.4 mg/dL — AB (ref 8.6–10.4)
CHLORIDE: 106 mmol/L (ref 98–110)
CO2: 26 mmol/L (ref 20–31)
Creat: 1.53 mg/dL — ABNORMAL HIGH (ref 0.60–0.93)
GFR, EST AFRICAN AMERICAN: 37 mL/min — AB (ref >=60)
GFR, EST NON AFRICAN AMERICAN: 32 mL/min — AB (ref >=60)
Glucose, Bld: 161 mg/dL — ABNORMAL HIGH (ref 65–99)
POTASSIUM: 4.4 mmol/L (ref 3.5–5.3)
Sodium: 141 mmol/L (ref 135–146)
Total Bilirubin: 0.5 mg/dL (ref 0.2–1.2)
Total Protein: 6.7 g/dL (ref 6.1–8.1)

## 2016-01-31 LAB — HEMOGLOBIN A1C
HEMOGLOBIN A1C: 7.4 % — AB (ref <5.7)
MEAN PLASMA GLUCOSE: 166 mg/dL

## 2016-02-06 ENCOUNTER — Encounter: Payer: Self-pay | Admitting: "Endocrinology

## 2016-02-06 ENCOUNTER — Ambulatory Visit (INDEPENDENT_AMBULATORY_CARE_PROVIDER_SITE_OTHER): Payer: Medicare Other | Admitting: "Endocrinology

## 2016-02-06 VITALS — BP 130/71 | HR 70 | Ht 63.0 in | Wt 207.0 lb

## 2016-02-06 DIAGNOSIS — E1122 Type 2 diabetes mellitus with diabetic chronic kidney disease: Secondary | ICD-10-CM | POA: Diagnosis not present

## 2016-02-06 DIAGNOSIS — I10 Essential (primary) hypertension: Secondary | ICD-10-CM

## 2016-02-06 DIAGNOSIS — E782 Mixed hyperlipidemia: Secondary | ICD-10-CM

## 2016-02-06 DIAGNOSIS — M25541 Pain in joints of right hand: Secondary | ICD-10-CM | POA: Diagnosis not present

## 2016-02-06 DIAGNOSIS — N183 Chronic kidney disease, stage 3 (moderate): Secondary | ICD-10-CM | POA: Diagnosis not present

## 2016-02-06 NOTE — Patient Instructions (Signed)

## 2016-02-06 NOTE — Progress Notes (Signed)
Subjective:    Patient ID: [name], female    DOB: December 08, 1934, PCP [name], MD   Past Medical History:  Diagnosis Date  . Anxiety   . Asthma   . Chronic back pain   . Colonoscopy causing post-procedural bleeding    Incomplete. by Dr. [name] 06/11/99 due to fixed non-compliant colon precluded exam to 40 cm, she had subsequent colectomy for diverticulitis since that time.   . Diarrhea   . Esophageal reflux   . Essential hypertension, benign   . Fibromyalgia   . Hiatal hernia    Recent EGD/TCS showed small hiatal hernia, normal apperaring tubular esophagus. s/p passage of a 54-french Maloney dilator, s/p biopsy esophageal mucosa, multiple fundal gland type gastric polyps. one large pedunculated polp with oozing, noted s/p clipping and snare polypectomy. Biopsies of the gastric mucosa taken given her symptoms in her elevated count. normal D1-D3, s/p biospy D2-D3.   [name] Hiatal hernia    all bx unremarkable. on TCS she had left-sided diverticula, evidence of prior segmental resection with anastomosis, multiple colonic polyps s/p multiple snare polypectomies, s/p segmental biopsy and stool sampling. she had multiple tubular adenomas. no microscopic/collagenous colitis. stool culture, c diff, O+P, lactoferrin were negative.   . Mixed hyperlipidemia   . Nephrolithiasis   . OSA (obstructive sleep apnea)   . Permanent atrial fibrillation (HCC)    Previous failed DCCV  . Type 2 diabetes mellitus ([place])   . Ureteral stent retained    Not retained. ureteral stent removal gross hematuria resolved 10/11. coumadin restarted.   . Ventral hernia    Past Surgical History:  Procedure Laterality Date  . BREAST LUMPECTOMY     benign cyst  . CATARACT EXTRACTION W/PHACO Left 08/17/2012   Procedure: CATARACT EXTRACTION PHACO AND INTRAOCULAR LENS PLACEMENT (IOC);  Surgeon: [name], MD;  Location: [facility];  Service: Ophthalmology;  Laterality: Left;  CDE:18.73  . COLECTOMY     2005. Dr.  Geoffry Paradise for diverticulitis  . COLONOSCOPY  02/22/09   normal/left-sided diverticula/multiple colonic polyp, adenomatous  . COLONOSCOPY  12/16/2011   colonic polyps-treated as described above. Status postprior sigmoid resection. adenomatous. next TCS 12/2014  . COLONOSCOPY N/A 12/28/2014   Status post prior segmental resection. Few residual colonic diverticula- status post segmental biopsy negative random colon biopsies  . ESOPHAGEAL DILATION N/A 12/28/2014   Procedure: ESOPHAGEAL DILATION;  Surgeon: [name], MD;  Location: [facility];  Service: Endoscopy;  Laterality: N/A;  . ESOPHAGOGASTRODUODENOSCOPY  02/22/09   normal s/p dilator;small HH/one large pedunculates polyp  s/p clipping, hyperplastic  . ESOPHAGOGASTRODUODENOSCOPY  12/16/2011   [name]: small hiatal hernia. Hyperplastic apperating polyps. Status post [name] dilation as described above.  . ESOPHAGOGASTRODUODENOSCOPY N/A 12/28/2014   RMR: Normal-appearing esophagus status post passage of a maloney dilator. Hiatal hernia. abnormal gastic mucosa of uncertain significance status post gastric biopsy with mild chronic gastritis, no H pylori.   . TONSILLECTOMY    . TOTAL ABDOMINAL HYSTERECTOMY     Social History   Social History  . Marital status: Widowed    Spouse name: N/A  . Number of children: N/A  . Years of education: N/A   Social History Main Topics  . Smoking status: Never Smoker  . Smokeless tobacco: Never Used     Comment: tobacco use - no  . Alcohol use No  . Drug use: No  . Sexual activity: Not Asked   Other Topics Concern  . None   Social History  Narrative  . None   Outpatient Encounter Prescriptions as of 02/06/2016  Medication Sig  . acetaminophen (TYLENOL) 325 MG tablet Take 650 mg by mouth every 6 (six) hours as needed for pain.   [name] ALPRAZolam (XANAX) 1 MG tablet Take 0.5 mg by mouth 2 (two) times daily as needed for anxiety.   [name] amLODipine (NORVASC) 10 MG tablet Take 10 mg by mouth daily.  .  cloNIDine (CATAPRES - DOSED IN MG/24 HR) 0.1 mg/24hr patch APPLY 1 PATCH ONCE A WEEK AS DIRECTED  . dicyclomine (BENTYL) 10 MG capsule Take one in am and one at bedtime for diarrhea. Hold for constipation.  [name] HUMALOG KWIKPEN 100 UNIT/ML KiwkPen INJECT 5-11 UNITS INTO THE SKIN THREE TIMES DAILY  . hydrochlorothiazide 50 MG tablet Take 50 mg by mouth daily.   [name] LANTUS SOLOSTAR 100 UNIT/ML Solostar Pen INJECT 26 UNITS INTO THE SKIN DAILY AT 10 PM.  . metoprolol tartrate (LOPRESSOR) 25 MG tablet Take 1 tablet (25 mg total) by mouth 2 (two) times daily.  . nitroGLYCERIN (NITROSTAT) 0.4 MG SL tablet Place 1 tablet (0.4 mg total) under the tongue every 5 (five) minutes x 3 doses as needed for chest pain. If no relief after the 3 rd dose, proceed to the ED for an evaluation  . nystatin cream (MYCOSTATIN) Apply 1 application topically 2 (two) times daily. Continue for two days after resolution of symptoms.  . ondansetron (ZOFRAN-ODT) 4 MG disintegrating tablet Take 4 mg by mouth every 8 (eight) hours as needed. Nausea and Vomiting  . oxyCODONE (ROXICODONE) 15 MG immediate release tablet Take 15 mg by mouth every 6 (six) hours as needed. Pain  . pantoprazole (PROTONIX) 40 MG tablet TAKE 1 TABLET BY MOUTH TWICE DAILY  . simvastatin (ZOCOR) 20 MG tablet Take 1 tablet by mouth daily.  . tizanidine (ZANAFLEX) 2 MG capsule Take 2 mg by mouth 3 (three) times daily as needed for muscle spasms.   [name] UNABLE TO FIND Med Name: diclofenac-gabapentin-lidocaine- compounded lotion for back  . VICTOZA 18 MG/3ML SOPN INJECT 0.3 MLS (1.8 MG TOTAL) INTO THE SKIN DAILY  . warfarin (COUMADIN) 5 MG tablet TAKE 1/2 TABLET BY MOUTH EVERY DAY , EXCEPT TAKE 1 TABLET BY MOUTH ON MONDAY, WEDNESDAY, AND FRIDAY OR AS DIRECTED BY COUMADIN CLINIC   No facility-administered encounter medications on file as of 02/06/2016.    ALLERGIES: Allergies  Allergen Reactions  . Adhesive [Tape] Itching    Pulls skin off.   . Demerol [Meperidine]      Causes Vomiting  . Iohexol Nausea And Vomiting  . Meperidine Hcl Nausea And Vomiting  . Shellfish Allergy Diarrhea and Nausea And Vomiting  . Penicillins Nausea And Vomiting and Rash   VACCINATION STATUS:  There is no immunization history on file for this patient.  Diabetes  She presents for her follow-up diabetic visit. She has type 2 diabetes mellitus. Onset time: She was diagnosed at approximate age of 75 years. Her disease course has been stable. There are no hypoglycemic associated symptoms. Pertinent negatives for hypoglycemia include no confusion, headaches, pallor or seizures. (She has a few random mild hypoglycemia episodes.) There are no diabetic associated symptoms. Pertinent negatives for diabetes include no chest pain, no polydipsia, no polyphagia and no polyuria. There are no hypoglycemic complications. Symptoms are stable. Diabetic complications include nephropathy. Risk factors for coronary artery disease include diabetes mellitus, dyslipidemia, hypertension, obesity and sedentary lifestyle. Current diabetic treatment includes intensive insulin program. She is compliant with treatment  most of the time. Her weight is stable. She is following a generally unhealthy diet. She has had a previous visit with a dietitian. Her home blood glucose trend is decreasing steadily. Her breakfast blood glucose range is generally 130-140 mg/dl. Her lunch blood glucose range is generally 130-140 mg/dl. Her dinner blood glucose range is generally 130-140 mg/dl. Her overall blood glucose range is 130-140 mg/dl. Eye exam is current.  Hyperlipidemia  This is a chronic problem. The current episode started more than 1 year ago. Exacerbating diseases include diabetes. Pertinent negatives include no chest pain, myalgias or shortness of breath. Current antihyperlipidemic treatment includes statins. Risk factors for coronary artery disease include diabetes mellitus, dyslipidemia, hypertension, obesity and a  sedentary lifestyle.  Hypertension  This is a chronic problem. The current episode started more than 1 year ago. Pertinent negatives include no chest pain, headaches, palpitations or shortness of breath. Risk factors for coronary artery disease include diabetes mellitus, dyslipidemia, obesity and sedentary lifestyle.     Review of Systems  Constitutional: Negative for chills, fever and unexpected weight change.  HENT: Negative for trouble swallowing and voice change.   Eyes: Negative for visual disturbance.  Respiratory: Negative for cough, shortness of breath and wheezing.   Cardiovascular: Negative for chest pain, palpitations and leg swelling.  Gastrointestinal: Negative for diarrhea, nausea and vomiting.  Endocrine: Negative for cold intolerance, heat intolerance, polydipsia, polyphagia and polyuria.  Musculoskeletal: Negative for arthralgias and myalgias.  Skin: Negative for color change, pallor, rash and wound.  Neurological: Negative for seizures and headaches.  Psychiatric/Behavioral: Negative for confusion and suicidal ideas.    Objective:    BP 130/71   Pulse 70   Ht 5\' 3"  (1.6 m)   Wt 207 lb (93.9 kg)   BMI 36.67 kg/m   Wt Readings from Last 3 Encounters:  02/06/16 207 lb (93.9 kg)  10/31/15 204 lb (92.5 kg)  09/05/15 210 lb (95.3 kg)    Physical Exam  Constitutional: She is oriented to person, place, and time. She appears well-developed.  HENT:  Head: Normocephalic and atraumatic.  Eyes: EOM are normal.  Neck: Normal range of motion. Neck supple. No tracheal deviation present. No thyromegaly present.  Cardiovascular: Normal rate and regular rhythm.   Pulmonary/Chest: Effort normal and breath sounds normal.  Abdominal: Soft. Bowel sounds are normal. There is no tenderness. There is no guarding.  Musculoskeletal: Normal range of motion. She exhibits no edema.  Neurological: She is alert and oriented to person, place, and time. She has normal reflexes. No cranial  nerve deficit. Coordination normal.  Skin: Skin is warm and dry. No rash noted. No erythema. No pallor.  Psychiatric: She has a normal mood and affect. Judgment normal.     CMP ( most recent) CMP     Component Value Date/Time   NA 137 10/11/2015 1301   K 4.7 10/11/2015 1301   CL 101 10/11/2015 1301   CO2 28 10/11/2015 1301   GLUCOSE 200 (H) 10/11/2015 1301   BUN 26 (H) 10/11/2015 1301   CREATININE 1.38 (H) 10/11/2015 1301   CALCIUM 9.2 10/11/2015 1301   PROT 6.9 02/28/2014 1050   ALBUMIN 3.9 02/28/2014 1050   AST 21 02/28/2014 1050   ALT 15 02/28/2014 1050   ALKPHOS 87 02/28/2014 1050   BILITOT 0.5 02/28/2014 1050   GFRNONAA 34 (L) 08/11/2012 1048   GFRAA 40 (L) 08/11/2012 1048     Diabetic Labs (most recent): Lab Results  Component Value Date   HGBA1C  7.9 (H) 10/11/2015   HGBA1C 7.9 10/11/2015   HGBA1C 7.6 04/19/2014     Lipid Panel ( most recent) Lipid Panel     Component Value Date/Time   CHOL  07/11/2009 2342    115        ATP III CLASSIFICATION:  <200     mg/dL   Desirable  200-239  mg/dL   Borderline High  >=240    mg/dL   High          TRIG 174 (H) 07/11/2009 2342   HDL 25 (L) 07/11/2009 2342   CHOLHDL 4.6 07/11/2009 2342   VLDL 35 07/11/2009 2342   LDLCALC  07/11/2009 2342    55        Total Cholesterol/HDL:CHD Risk Coronary Heart Disease Risk Table                     Men   Women  1/2 Average Risk   3.4   3.3  Average Risk       5.0   4.4  2 X Average Risk   9.6   7.1  3 X Average Risk  23.4   11.0        Use the calculated Patient Ratio above and the CHD Risk Table to determine the patient's CHD Risk.        ATP III CLASSIFICATION (LDL):  <100     mg/dL   Optimal  100-129  mg/dL   Near or Above                    Optimal  130-159  mg/dL   Borderline  160-189  mg/dL   High  >190     mg/dL   Very High      Assessment & Plan:   1. Diabetes mellitus with stage 3 chronic kidney disease (HCC)  - patient remains at a high risk for  more acute and chronic complications of diabetes which include CAD, CVA, CKD, retinopathy, and neuropathy. These are all discussed in detail with the patient.  Patient came with controlled glucose profile, and  her labs are not ready to review today. Her last visit A1c was 7.9%.  - Glucose logs and insulin administration records pertaining to this visit,  to be scanned into patient's records.  Recent labs reviewed.   - I have re-counseled the patient on diet management and weight loss  by adopting a carbohydrate restricted / protein [name]  Diet.  - Suggestion is made for patient to avoid simple carbohydrates   from their diet including Cakes , Desserts, Ice Cream,  Soda (  diet and regular) , Sweet Tea , Candies,  Chips, Cookies, Artificial Sweeteners,   and "Sugar-free" Products .  This will help patient to have stable blood glucose profile and potentially avoid unintended  Weight gain.  - Patient is advised to stick to a routine mealtimes to eat 3 meals  a day and avoid unnecessary snacks ( to snack only to correct hypoglycemia).  - The patient  has been  scheduled with Jearld Fenton, RDN, CDE for individualized DM education.  - I have approached patient with the following individualized plan to manage diabetes and patient agrees.  - I will continue Lantus  26 units QHS, continue Humalog 5 units TIDAC plus SSI , - continue Victoza 1.8mg  daily . -Side effects of Victoza is discussed with the patient.   -Patient is not a candidate for metformin and SGLT2 inhibitors  due to CKD.  - Patient specific target  for A1c; LDL, HDL, Triglycerides, and  Waist Circumference were discussed in detail.  2) BP/HTN: Controlled. Continue current medications.  3) Lipids/HPL:  she is on Simvastatin 20 mg po qhs. 4)  Weight/Diet: CDE consult in progress, exercise, and carbohydrates information provided.  5) Chronic Care/Health Maintenance:  -Patient is on  Statin medications and encouraged to continue to  follow up with Ophthalmology, Podiatrist at least yearly or according to recommendations, and advised to  stay away from smoking. I have recommended yearly flu vaccine and pneumonia vaccination at least every 5 years; moderate intensity exercise for up to 150 minutes weekly; and  sleep for at least 7 hours a day.  - 25 minutes of time was spent on the care of this patient , 50% of which was applied for counseling on diabetes complications and their preventions.  - I advised patient to maintain close follow up with [name], MD for primary care needs.  Patient is asked to bring meter and  blood glucose logs during their next visit.   Follow up plan: -Return in about 3 months (around 05/08/2016) for follow up with pre-visit labs, meter, and logs.  Glade Lloyd, MD Phone: [phone]  Fax: [phone]   02/06/2016, 10:48 AM

## 2016-02-07 ENCOUNTER — Telehealth: Payer: Self-pay | Admitting: Cardiology

## 2016-02-07 NOTE — Telephone Encounter (Signed)
Pt has gout in Rt wrist.  Was started on Indocin tid x 7 days and urloric x 5 days by PCP.  Discussed increased bleeding risk with Indocin and Warfarin.  She will only take indocin for a few days and then use only as needed.  See has an INR appt next week.

## 2016-02-07 NOTE — Telephone Encounter (Signed)
Mrs. [name] called stating that she has gout. She was prescribed a medication on Tuesday by Dr. [name]. She is not sure the name of medication. Wants to know about taking it with Coumdin.

## 2016-02-08 DIAGNOSIS — I10 Essential (primary) hypertension: Secondary | ICD-10-CM | POA: Diagnosis not present

## 2016-02-08 DIAGNOSIS — E785 Hyperlipidemia, unspecified: Secondary | ICD-10-CM | POA: Diagnosis not present

## 2016-02-08 DIAGNOSIS — K219 Gastro-esophageal reflux disease without esophagitis: Secondary | ICD-10-CM | POA: Diagnosis not present

## 2016-02-08 DIAGNOSIS — G4701 Insomnia due to medical condition: Secondary | ICD-10-CM | POA: Diagnosis not present

## 2016-02-08 DIAGNOSIS — Z79891 Long term (current) use of opiate analgesic: Secondary | ICD-10-CM | POA: Diagnosis not present

## 2016-02-08 DIAGNOSIS — M25511 Pain in right shoulder: Secondary | ICD-10-CM | POA: Diagnosis not present

## 2016-02-08 DIAGNOSIS — G43909 Migraine, unspecified, not intractable, without status migrainosus: Secondary | ICD-10-CM | POA: Diagnosis not present

## 2016-02-08 DIAGNOSIS — E119 Type 2 diabetes mellitus without complications: Secondary | ICD-10-CM | POA: Diagnosis not present

## 2016-02-08 DIAGNOSIS — M545 Low back pain: Secondary | ICD-10-CM | POA: Diagnosis not present

## 2016-02-08 DIAGNOSIS — M797 Fibromyalgia: Secondary | ICD-10-CM | POA: Diagnosis not present

## 2016-02-08 DIAGNOSIS — G8929 Other chronic pain: Secondary | ICD-10-CM | POA: Diagnosis not present

## 2016-02-08 DIAGNOSIS — M25552 Pain in left hip: Secondary | ICD-10-CM | POA: Diagnosis not present

## 2016-02-12 DIAGNOSIS — Z23 Encounter for immunization: Secondary | ICD-10-CM | POA: Diagnosis not present

## 2016-02-15 ENCOUNTER — Ambulatory Visit (INDEPENDENT_AMBULATORY_CARE_PROVIDER_SITE_OTHER): Payer: Medicare Other | Admitting: *Deleted

## 2016-02-15 DIAGNOSIS — Z5181 Encounter for therapeutic drug level monitoring: Secondary | ICD-10-CM

## 2016-02-15 DIAGNOSIS — I4821 Permanent atrial fibrillation: Secondary | ICD-10-CM

## 2016-02-15 DIAGNOSIS — I482 Chronic atrial fibrillation: Secondary | ICD-10-CM

## 2016-02-15 LAB — POCT INR: INR: 2.4

## 2016-02-16 ENCOUNTER — Encounter: Payer: Self-pay | Admitting: Gastroenterology

## 2016-02-16 ENCOUNTER — Ambulatory Visit (INDEPENDENT_AMBULATORY_CARE_PROVIDER_SITE_OTHER): Payer: Medicare Other | Admitting: Gastroenterology

## 2016-02-16 VITALS — BP 127/77 | HR 70 | Temp 98.1°F | Ht 63.0 in | Wt 207.8 lb

## 2016-02-16 DIAGNOSIS — K219 Gastro-esophageal reflux disease without esophagitis: Secondary | ICD-10-CM

## 2016-02-16 DIAGNOSIS — K580 Irritable bowel syndrome with diarrhea: Secondary | ICD-10-CM

## 2016-02-16 MED ORDER — NYSTATIN 100000 UNIT/GM EX CREA: 1.0000 "application " | TOPICAL_CREAM | Freq: Two times a day (BID) | CUTANEOUS | 0 refills | Status: DC

## 2016-02-16 MED ORDER — DICYCLOMINE HCL 10 MG PO CAPS: ORAL_CAPSULE | ORAL | 5 refills | Status: DC

## 2016-02-16 NOTE — Assessment & Plan Note (Signed)
IBS D. Increased fecal urgency recently. Will increase a.m. Bentyl dose to 20 mg, continue 10 mg at bedtime. Call with ongoing issues. Otherwise see her back in 6 months.

## 2016-02-16 NOTE — Progress Notes (Signed)
Primary Care Physician: [name], MD  Primary Gastroenterologist:  Garfield Cornea, MD   Chief Complaint  Patient presents with  . Irritable Bowel Syndrome    "nerves", diarrhea (unable to control)    HPI: [name] is a 81 y.o. female here for follow-up of IBS. She also has history of complicated diverticulitis requiring partial colectomy, GERD. EGD and colonoscopy in September 2016. Small hiatal hernia, abnormal gastric mucosa with mild chronic gastritis on biopsy. No H pylori. Empirical dilation of esophagus for dysphagia. Surgical anastomosis of the colon noted at 2025 cm from the anal verge. Scattered residual distal colonic diverticula, distal 5 cm of the TI were normal. Random colon biopsies unremarkable.  Under a lot of stress. Two grown sons living with her and not contributing financially. One diagnoses with bipolar d/o and goes to rehab daily. 50 minute drive. She has had couple of incidence of fecal Urgency with incontinence during the trips. Takes Bentyl first thing in the morning and once at bedtime. Now she is very nervous status, continue to happen which upsets her stomach further. Wakes up feeling nauseated some morning. Sugar in the 80s. Followed by diarrhea. No melena, brbpr. Heartburn well-controlled but has to take PPI BID. If misses dose, then she wakes up with regurgitation and choking in middle of night.    Current Outpatient Prescriptions  Medication Sig Dispense Refill  . acetaminophen (TYLENOL) 325 MG tablet Take 650 mg by mouth every 6 (six) hours as needed for pain.     [name] ALPRAZolam (XANAX) 1 MG tablet Take 0.5 mg by mouth 2 (two) times daily as needed for anxiety.     [name] amLODipine (NORVASC) 10 MG tablet Take 10 mg by mouth daily.    . cloNIDine (CATAPRES - DOSED IN MG/24 HR) 0.1 mg/24hr patch APPLY 1 PATCH ONCE A WEEK AS DIRECTED 4 patch 6  . dicyclomine (BENTYL) 10 MG capsule Take one in am and one at bedtime for diarrhea. Hold for constipation.  (Patient taking differently: Take 10 mg by mouth as needed. Take one in am and one at bedtime for diarrhea. Hold for constipation.) 60 capsule 1  . HUMALOG KWIKPEN 100 UNIT/ML KiwkPen INJECT 5-11 UNITS INTO THE SKIN THREE TIMES DAILY 15 mL 2  . hydrochlorothiazide 50 MG tablet Take 25 mg by mouth daily.     [name] LANTUS SOLOSTAR 100 UNIT/ML Solostar Pen INJECT 26 UNITS INTO THE SKIN DAILY AT 10 PM. 15 mL 2  . metoprolol tartrate (LOPRESSOR) 25 MG tablet Take 1 tablet (25 mg total) by mouth 2 (two) times daily. 60 tablet 6  . nitroGLYCERIN (NITROSTAT) 0.4 MG SL tablet Place 1 tablet (0.4 mg total) under the tongue every 5 (five) minutes x 3 doses as needed for chest pain. If no relief after the 3 rd dose, proceed to the ED for an evaluation 90 tablet 3  . nystatin cream (MYCOSTATIN) Apply 1 application topically 2 (two) times daily. Continue for two days after resolution of symptoms. (Patient taking differently: Apply 1 application topically as needed. Continue for two days after resolution of symptoms.) 60 g 0  . ondansetron (ZOFRAN-ODT) 4 MG disintegrating tablet Take 4 mg by mouth every 8 (eight) hours as needed. Nausea and Vomiting    . oxyCODONE (ROXICODONE) 15 MG immediate release tablet Take 15 mg by mouth every 6 (six) hours as needed. Pain    . pantoprazole (PROTONIX) 40 MG tablet TAKE 1 TABLET BY MOUTH TWICE DAILY 60  tablet 5  . simvastatin (ZOCOR) 20 MG tablet Take 1 tablet by mouth daily.    . tizanidine (ZANAFLEX) 2 MG capsule Take 2 mg by mouth 3 (three) times daily as needed for muscle spasms.     [name] UNABLE TO FIND Med Name: diclofenac-gabapentin-lidocaine- compounded lotion for back    . VICTOZA 18 MG/3ML SOPN INJECT 0.3 MLS (1.8 MG TOTAL) INTO THE SKIN DAILY 9 mL 2  . warfarin (COUMADIN) 5 MG tablet TAKE 1/2 TABLET BY MOUTH EVERY DAY , EXCEPT TAKE 1 TABLET BY MOUTH ON MONDAY, WEDNESDAY, AND FRIDAY OR AS DIRECTED BY COUMADIN CLINIC 30 tablet 3   No current facility-administered medications  for this visit.     Allergies as of 02/16/2016 - Review Complete 02/16/2016  Allergen Reaction Noted  . Adhesive [tape] Itching 11/29/2011  . Demerol [meperidine]  02/22/2014  . Iohexol Nausea And Vomiting 03/08/2004  . Meperidine hcl Nausea And Vomiting   . Shellfish allergy Diarrhea and Nausea And Vomiting 08/03/2012  . Penicillins Nausea And Vomiting and Rash     ROS:  General: Negative for anorexia, weight loss, fever, chills, fatigue, weakness. ENT: Negative for hoarseness, difficulty swallowing , nasal congestion. CV: Negative for chest pain, angina, palpitations, dyspnea on exertion, peripheral edema.  Respiratory: Negative for dyspnea at rest, dyspnea on exertion, cough, sputum, wheezing.  GI: See history of present illness. GU:  Negative for dysuria, hematuria, urinary incontinence, urinary frequency, nocturnal urination.  Endo: Negative for unusual weight change.    Physical Examination:   BP 127/77   Pulse 70   Temp 98.1 F (36.7 C) (Oral)   Ht 5\' 3"  (1.6 m)   Wt 207 lb 12.8 oz (94.3 kg)   BMI 36.81 kg/m   General: Well-nourished, well-developed in no acute distress.  Eyes: No icterus. Mouth: Oropharyngeal mucosa moist and pink , no lesions erythema or exudate. Lungs: Clear to auscultation bilaterally.  Heart: Regular rate and rhythm, no murmurs rubs or gallops.  Abdomen: Bowel sounds are normal, nontender, nondistended, no hepatosplenomegaly or masses, no abdominal bruits, no rebound or guarding.  Ventral diastasis with herniation easily reducible. Examined sitting up per patient request due to vertigo issues.  Extremities: No lower extremity edema. No clubbing or deformities. Neuro: Alert and oriented x 4   Skin: Warm and dry, no jaundice.   Psych: Alert and cooperative, normal mood and affect.

## 2016-02-16 NOTE — Assessment & Plan Note (Addendum)
Continues pantoprazole twice a day. Has not been able to reduce to once daily dosing because of refractory symptoms. Patient with chronic renal insufficiency. Patient reports that her nephrologist is aware of her PPI use. Reinforced anti-reflex measures.

## 2016-02-16 NOTE — Patient Instructions (Signed)
1. Please increase your bentyl to two in the morning and continue one at night. Call if you continue to have issues with urgency and incontinence.  2. Return to the office in six months or sooner if needed.

## 2016-02-20 NOTE — Progress Notes (Signed)
cc'ed to pcp °

## 2016-03-08 ENCOUNTER — Other Ambulatory Visit: Payer: Self-pay | Admitting: Cardiology

## 2016-03-28 ENCOUNTER — Ambulatory Visit (INDEPENDENT_AMBULATORY_CARE_PROVIDER_SITE_OTHER): Payer: Medicare Other | Admitting: *Deleted

## 2016-03-28 DIAGNOSIS — I482 Chronic atrial fibrillation: Secondary | ICD-10-CM | POA: Diagnosis not present

## 2016-03-28 DIAGNOSIS — Z5181 Encounter for therapeutic drug level monitoring: Secondary | ICD-10-CM

## 2016-03-28 DIAGNOSIS — I4821 Permanent atrial fibrillation: Secondary | ICD-10-CM

## 2016-03-28 LAB — POCT INR: INR: 2.6

## 2016-04-03 DIAGNOSIS — G8929 Other chronic pain: Secondary | ICD-10-CM | POA: Diagnosis not present

## 2016-04-03 DIAGNOSIS — M545 Low back pain: Secondary | ICD-10-CM | POA: Diagnosis not present

## 2016-04-03 DIAGNOSIS — M25511 Pain in right shoulder: Secondary | ICD-10-CM | POA: Diagnosis not present

## 2016-04-03 DIAGNOSIS — M797 Fibromyalgia: Secondary | ICD-10-CM | POA: Diagnosis not present

## 2016-04-03 DIAGNOSIS — E119 Type 2 diabetes mellitus without complications: Secondary | ICD-10-CM | POA: Diagnosis not present

## 2016-04-03 DIAGNOSIS — G4701 Insomnia due to medical condition: Secondary | ICD-10-CM | POA: Diagnosis not present

## 2016-04-03 DIAGNOSIS — M25552 Pain in left hip: Secondary | ICD-10-CM | POA: Diagnosis not present

## 2016-04-03 DIAGNOSIS — Z79891 Long term (current) use of opiate analgesic: Secondary | ICD-10-CM | POA: Diagnosis not present

## 2016-04-03 DIAGNOSIS — I10 Essential (primary) hypertension: Secondary | ICD-10-CM | POA: Diagnosis not present

## 2016-04-03 DIAGNOSIS — K219 Gastro-esophageal reflux disease without esophagitis: Secondary | ICD-10-CM | POA: Diagnosis not present

## 2016-04-03 DIAGNOSIS — G43909 Migraine, unspecified, not intractable, without status migrainosus: Secondary | ICD-10-CM | POA: Diagnosis not present

## 2016-04-03 DIAGNOSIS — E785 Hyperlipidemia, unspecified: Secondary | ICD-10-CM | POA: Diagnosis not present

## 2016-04-08 ENCOUNTER — Other Ambulatory Visit: Payer: Self-pay | Admitting: "Endocrinology

## 2016-04-08 ENCOUNTER — Other Ambulatory Visit: Payer: Self-pay | Admitting: Cardiology

## 2016-05-02 ENCOUNTER — Ambulatory Visit (INDEPENDENT_AMBULATORY_CARE_PROVIDER_SITE_OTHER): Payer: Medicare Other | Admitting: *Deleted

## 2016-05-02 DIAGNOSIS — Z5181 Encounter for therapeutic drug level monitoring: Secondary | ICD-10-CM

## 2016-05-02 DIAGNOSIS — I482 Chronic atrial fibrillation: Secondary | ICD-10-CM | POA: Diagnosis not present

## 2016-05-02 DIAGNOSIS — I4821 Permanent atrial fibrillation: Secondary | ICD-10-CM

## 2016-05-02 LAB — POCT INR: INR: 2.6

## 2016-05-05 ENCOUNTER — Other Ambulatory Visit: Payer: Self-pay | Admitting: "Endocrinology

## 2016-05-06 DIAGNOSIS — E782 Mixed hyperlipidemia: Secondary | ICD-10-CM | POA: Diagnosis not present

## 2016-05-06 DIAGNOSIS — E1122 Type 2 diabetes mellitus with diabetic chronic kidney disease: Secondary | ICD-10-CM | POA: Diagnosis not present

## 2016-05-06 DIAGNOSIS — I10 Essential (primary) hypertension: Secondary | ICD-10-CM | POA: Diagnosis not present

## 2016-05-06 LAB — LIPID PANEL
Cholesterol: 154 mg/dL (ref 0–200)
HDL: 31 mg/dL — AB (ref 35–70)
LDL Cholesterol: 93 mg/dL
TRIGLYCERIDES: 210 mg/dL — AB (ref 40–160)

## 2016-05-06 LAB — HEMOGLOBIN A1C: Hemoglobin A1C: 8.1

## 2016-05-07 ENCOUNTER — Other Ambulatory Visit: Payer: Self-pay | Admitting: Nurse Practitioner

## 2016-05-08 DIAGNOSIS — E6609 Other obesity due to excess calories: Secondary | ICD-10-CM | POA: Diagnosis not present

## 2016-05-08 DIAGNOSIS — E1122 Type 2 diabetes mellitus with diabetic chronic kidney disease: Secondary | ICD-10-CM | POA: Diagnosis not present

## 2016-05-08 DIAGNOSIS — Z0000 Encounter for general adult medical examination without abnormal findings: Secondary | ICD-10-CM | POA: Diagnosis not present

## 2016-05-08 DIAGNOSIS — K219 Gastro-esophageal reflux disease without esophagitis: Secondary | ICD-10-CM | POA: Diagnosis not present

## 2016-05-08 DIAGNOSIS — I482 Chronic atrial fibrillation: Secondary | ICD-10-CM | POA: Diagnosis not present

## 2016-05-08 DIAGNOSIS — N183 Chronic kidney disease, stage 3 (moderate): Secondary | ICD-10-CM | POA: Diagnosis not present

## 2016-05-08 DIAGNOSIS — I10 Essential (primary) hypertension: Secondary | ICD-10-CM | POA: Diagnosis not present

## 2016-05-08 DIAGNOSIS — H3530 Unspecified macular degeneration: Secondary | ICD-10-CM | POA: Diagnosis not present

## 2016-05-08 DIAGNOSIS — E782 Mixed hyperlipidemia: Secondary | ICD-10-CM | POA: Diagnosis not present

## 2016-05-09 ENCOUNTER — Ambulatory Visit (INDEPENDENT_AMBULATORY_CARE_PROVIDER_SITE_OTHER): Payer: Medicare Other | Admitting: "Endocrinology

## 2016-05-09 ENCOUNTER — Encounter: Payer: Self-pay | Admitting: "Endocrinology

## 2016-05-09 VITALS — BP 148/82 | HR 73 | Ht 63.0 in | Wt 209.0 lb

## 2016-05-09 DIAGNOSIS — E1122 Type 2 diabetes mellitus with diabetic chronic kidney disease: Secondary | ICD-10-CM | POA: Diagnosis not present

## 2016-05-09 DIAGNOSIS — I10 Essential (primary) hypertension: Secondary | ICD-10-CM

## 2016-05-09 DIAGNOSIS — N183 Chronic kidney disease, stage 3 unspecified: Secondary | ICD-10-CM

## 2016-05-09 DIAGNOSIS — E782 Mixed hyperlipidemia: Secondary | ICD-10-CM

## 2016-05-09 NOTE — Patient Instructions (Signed)

## 2016-05-09 NOTE — Progress Notes (Signed)
Subjective:    Patient ID: [name], female    DOB: 30-Mar-1935, PCP [name], MD   Past Medical History:  Diagnosis Date  . Anxiety   . Asthma   . Chronic back pain   . Colonoscopy causing post-procedural bleeding    Incomplete. by Dr. [name] 06/11/99 due to fixed non-compliant colon precluded exam to 40 cm, she had subsequent colectomy for diverticulitis since that time.   . Diarrhea   . Esophageal reflux   . Essential hypertension, benign   . Fibromyalgia   . Hiatal hernia    Recent EGD/TCS showed small hiatal hernia, normal apperaring tubular esophagus. s/p passage of a 54-french Maloney dilator, s/p biopsy esophageal mucosa, multiple fundal gland type gastric polyps. one large pedunculated polp with oozing, noted s/p clipping and snare polypectomy. Biopsies of the gastric mucosa taken given her symptoms in her elevated count. normal D1-D3, s/p biospy D2-D3.   [name] Hiatal hernia    all bx unremarkable. on TCS she had left-sided diverticula, evidence of prior segmental resection with anastomosis, multiple colonic polyps s/p multiple snare polypectomies, s/p segmental biopsy and stool sampling. she had multiple tubular adenomas. no microscopic/collagenous colitis. stool culture, c diff, O+P, lactoferrin were negative.   . Mixed hyperlipidemia   . Nephrolithiasis   . OSA (obstructive sleep apnea)   . Permanent atrial fibrillation (HCC)    Previous failed DCCV  . Type 2 diabetes mellitus ([place])   . Ureteral stent retained    Not retained. ureteral stent removal gross hematuria resolved 10/11. coumadin restarted.   . Ventral hernia    Past Surgical History:  Procedure Laterality Date  . BREAST LUMPECTOMY     benign cyst  . CATARACT EXTRACTION W/PHACO Left 08/17/2012   Procedure: CATARACT EXTRACTION PHACO AND INTRAOCULAR LENS PLACEMENT (IOC);  Surgeon: [name], MD;  Location: [facility];  Service: Ophthalmology;  Laterality: Left;  CDE:18.73  . COLECTOMY     2005. Dr.  Geoffry Paradise for diverticulitis  . COLONOSCOPY  02/22/09   normal/left-sided diverticula/multiple colonic polyp, adenomatous  . COLONOSCOPY  12/16/2011   colonic polyps-treated as described above. Status postprior sigmoid resection. adenomatous. next TCS 12/2014  . COLONOSCOPY N/A 12/28/2014   Status post prior segmental resection. Few residual colonic diverticula- status post segmental biopsy negative random colon biopsies  . ESOPHAGEAL DILATION N/A 12/28/2014   Procedure: ESOPHAGEAL DILATION;  Surgeon: [name], MD;  Location: [facility];  Service: Endoscopy;  Laterality: N/A;  . ESOPHAGOGASTRODUODENOSCOPY  02/22/09   normal s/p dilator;small HH/one large pedunculates polyp  s/p clipping, hyperplastic  . ESOPHAGOGASTRODUODENOSCOPY  12/16/2011   [name]: small hiatal hernia. Hyperplastic apperating polyps. Status post [name] dilation as described above.  . ESOPHAGOGASTRODUODENOSCOPY N/A 12/28/2014   RMR: Normal-appearing esophagus status post passage of a maloney dilator. Hiatal hernia. abnormal gastic mucosa of uncertain significance status post gastric biopsy with mild chronic gastritis, no H pylori.   . TONSILLECTOMY    . TOTAL ABDOMINAL HYSTERECTOMY     Social History   Social History  . Marital status: Widowed    Spouse name: N/A  . Number of children: N/A  . Years of education: N/A   Social History Main Topics  . Smoking status: Never Smoker  . Smokeless tobacco: Never Used     Comment: tobacco use - no  . Alcohol use No  . Drug use: No  . Sexual activity: Not Asked   Other Topics Concern  . None   Social History  Narrative  . None   Outpatient Encounter Prescriptions as of 05/09/2016  Medication Sig  . acetaminophen (TYLENOL) 325 MG tablet Take 650 mg by mouth every 6 (six) hours as needed for pain.   [name] ALPRAZolam (XANAX) 1 MG tablet Take 0.5 mg by mouth 2 (two) times daily as needed for anxiety.   [name] amLODipine (NORVASC) 10 MG tablet Take 10 mg by mouth daily.  .  cloNIDine (CATAPRES - DOSED IN MG/24 HR) 0.1 mg/24hr patch APPLY 1 PATCH ONCE A WEEK AS DIRECTED  . dicyclomine (BENTYL) 10 MG capsule Take two in am and one at bedtime for diarrhea. Hold for constipation.  [name] HUMALOG KWIKPEN 100 UNIT/ML KiwkPen INJECT 5-11 UNITS INTO THE SKIN THREE TIMES DAILY  . hydrochlorothiazide 50 MG tablet Take 25 mg by mouth daily.   [name] LANTUS SOLOSTAR 100 UNIT/ML Solostar Pen INJECT 26 UNITS INTO THE SKIN DAILY AT 10 PM.  . metoprolol tartrate (LOPRESSOR) 25 MG tablet TAKE 1 TABLET BY MOUTH TWICE DAILY  . nitroGLYCERIN (NITROSTAT) 0.4 MG SL tablet Place 1 tablet (0.4 mg total) under the tongue every 5 (five) minutes x 3 doses as needed for chest pain. If no relief after the 3 rd dose, proceed to the ED for an evaluation  . nystatin cream (MYCOSTATIN) Apply 1 application topically 2 (two) times daily. Continue for two days after resolution of symptoms.  . ondansetron (ZOFRAN-ODT) 4 MG disintegrating tablet Take 4 mg by mouth every 8 (eight) hours as needed. Nausea and Vomiting  . oxyCODONE (ROXICODONE) 15 MG immediate release tablet Take 15 mg by mouth every 6 (six) hours as needed. Pain  . pantoprazole (PROTONIX) 40 MG tablet TAKE 1 TABLET BY MOUTH TWICE DAILY  . simvastatin (ZOCOR) 20 MG tablet Take 1 tablet by mouth daily.  . tizanidine (ZANAFLEX) 2 MG capsule Take 2 mg by mouth 3 (three) times daily as needed for muscle spasms.   [name] UNABLE TO FIND Med Name: diclofenac-gabapentin-lidocaine- compounded lotion for back  . VICTOZA 18 MG/3ML SOPN INJECT 0.3 MLS (1.8 MG TOTAL) INTO THE SKIN DAILY  . warfarin (COUMADIN) 5 MG tablet TAKE 1/2 TABLET BY MOUTH EVERY DAY, EXCEPT TAKE 1 TABLET BY MOUTH ON MONDAY, WED AND FRIDAY OR AS DIRECTED BY COUMADIN CLINIC   No facility-administered encounter medications on file as of 05/09/2016.    ALLERGIES: Allergies  Allergen Reactions  . Adhesive [Tape] Itching    Pulls skin off.   . Demerol [Meperidine]     Causes Vomiting  . Iohexol  Nausea And Vomiting  . Meperidine Hcl Nausea And Vomiting  . Shellfish Allergy Diarrhea and Nausea And Vomiting  . Penicillins Nausea And Vomiting and Rash   VACCINATION STATUS:  There is no immunization history on file for this patient.  Diabetes  She presents for her follow-up diabetic visit. She has type 2 diabetes mellitus. Onset time: She was diagnosed at approximate age of 71 years. Her disease course has been stable. There are no hypoglycemic associated symptoms. Pertinent negatives for hypoglycemia include no confusion, headaches, pallor or seizures. (She has a few random mild hypoglycemia episodes.) There are no diabetic associated symptoms. Pertinent negatives for diabetes include no chest pain, no polydipsia, no polyphagia and no polyuria. There are no hypoglycemic complications. Symptoms are stable. Diabetic complications include nephropathy. Risk factors for coronary artery disease include diabetes mellitus, dyslipidemia, hypertension, obesity and sedentary lifestyle. Current diabetic treatment includes intensive insulin program. She is compliant with treatment most of the time. Her weight  is stable. She is following a generally unhealthy diet. She has had a previous visit with a dietitian. Her home blood glucose trend is decreasing steadily. Her breakfast blood glucose range is generally 130-140 mg/dl. Her lunch blood glucose range is generally 130-140 mg/dl. Her dinner blood glucose range is generally 130-140 mg/dl. Her overall blood glucose range is 130-140 mg/dl. Eye exam is current.  Hyperlipidemia  This is a chronic problem. The current episode started more than 1 year ago. Exacerbating diseases include diabetes. Pertinent negatives include no chest pain, myalgias or shortness of breath. Current antihyperlipidemic treatment includes statins. Risk factors for coronary artery disease include diabetes mellitus, dyslipidemia, hypertension, obesity and a sedentary lifestyle.  Hypertension   This is a chronic problem. The current episode started more than 1 year ago. Pertinent negatives include no chest pain, headaches, palpitations or shortness of breath. Risk factors for coronary artery disease include diabetes mellitus, dyslipidemia, obesity and sedentary lifestyle.     Review of Systems  Constitutional: Negative for chills, fever and unexpected weight change.  HENT: Negative for trouble swallowing and voice change.   Eyes: Negative for visual disturbance.  Respiratory: Negative for cough, shortness of breath and wheezing.   Cardiovascular: Negative for chest pain, palpitations and leg swelling.  Gastrointestinal: Negative for diarrhea, nausea and vomiting.  Endocrine: Negative for cold intolerance, heat intolerance, polydipsia, polyphagia and polyuria.  Musculoskeletal: Negative for arthralgias and myalgias.  Skin: Negative for color change, pallor, rash and wound.  Neurological: Negative for seizures and headaches.  Psychiatric/Behavioral: Negative for confusion and suicidal ideas.    Objective:    BP (!) 148/82   Pulse 73   Ht 5\' 3"  (1.6 m)   Wt 209 lb (94.8 kg)   BMI 37.02 kg/m   Wt Readings from Last 3 Encounters:  05/09/16 209 lb (94.8 kg)  02/16/16 207 lb 12.8 oz (94.3 kg)  02/06/16 207 lb (93.9 kg)    Physical Exam  Constitutional: She is oriented to person, place, and time. She appears well-developed.  HENT:  Head: Normocephalic and atraumatic.  Eyes: EOM are normal.  Neck: Normal range of motion. Neck supple. No tracheal deviation present. No thyromegaly present.  Cardiovascular: Normal rate and regular rhythm.   Pulmonary/Chest: Effort normal and breath sounds normal.  Abdominal: Soft. Bowel sounds are normal. There is no tenderness. There is no guarding.  Musculoskeletal: Normal range of motion. She exhibits no edema.  Neurological: She is alert and oriented to person, place, and time. She has normal reflexes. No cranial nerve deficit.  Coordination normal.  Skin: Skin is warm and dry. No rash noted. No erythema. No pallor.  Psychiatric: She has a normal mood and affect. Judgment normal.     CMP ( most recent) CMP     Component Value Date/Time   NA 141 01/30/2016 1015   K 4.4 01/30/2016 1015   CL 106 01/30/2016 1015   CO2 26 01/30/2016 1015   GLUCOSE 161 (H) 01/30/2016 1015   BUN 21 01/30/2016 1015   CREATININE 1.53 (H) 01/30/2016 1015   CALCIUM 8.4 (L) 01/30/2016 1015   PROT 6.7 01/30/2016 1015   ALBUMIN 3.8 01/30/2016 1015   AST 17 01/30/2016 1015   ALT 11 01/30/2016 1015   ALKPHOS 65 01/30/2016 1015   BILITOT 0.5 01/30/2016 1015   GFRNONAA 32 (L) 01/30/2016 1015   GFRAA 37 (L) 01/30/2016 1015     Diabetic Labs (most recent): Lab Results  Component Value Date   HGBA1C 8.1 05/06/2016  HGBA1C 7.4 (H) 01/30/2016   HGBA1C 7.9 (H) 10/11/2015    Lipid Panel     Component Value Date/Time   CHOL 154 05/06/2016   TRIG 210 (A) 05/06/2016   HDL 31 (A) 05/06/2016   CHOLHDL 4.6 07/11/2009 2342   VLDL 35 07/11/2009 2342   LDLCALC 93 05/06/2016      Assessment & Plan:   1. Diabetes mellitus with stage 3 chronic kidney disease (HCC)  - patient remains at a high risk for more acute and chronic complications of diabetes which include CAD, CVA, CKD, retinopathy, and neuropathy. These are all discussed in detail with the patient.  Patient came with  Above target glucose profile,  Mainly due to interruption in her basal insulin for 2 weeks due to insurance problems. Her A1c has increased to 8.1% 7.9%.  - Glucose logs and insulin administration records pertaining to this visit,  to be scanned into patient's records.  Recent labs reviewed.   - I have re-counseled the patient on diet management and weight loss  by adopting a carbohydrate restricted / protein [name]  Diet.  - Suggestion is made for patient to avoid simple carbohydrates   from their diet including Cakes , Desserts, Ice Cream,  Soda (  diet and  regular) , Sweet Tea , Candies,  Chips, Cookies, Artificial Sweeteners,   and "Sugar-free" Products .  This will help patient to have stable blood glucose profile and potentially avoid unintended  Weight gain.  - Patient is advised to stick to a routine mealtimes to eat 3 meals  a day and avoid unnecessary snacks ( to snack only to correct hypoglycemia).  - The patient  has been  scheduled with Jearld Fenton, RDN, CDE for individualized DM education.  - I have approached patient with the following individualized plan to manage diabetes and patient agrees.  - I will continue Lantus  26 units QHS, increase Humalog to 8 units TIDAC plus SSI , - continue Victoza 1.8mg  daily . -Side effects of Victoza is discussed with the patient.   -Patient is not a candidate for metformin and SGLT2 inhibitors due to CKD.  - Patient specific target  for A1c; LDL, HDL, Triglycerides, and  Waist Circumference were discussed in detail.  2) BP/HTN: Controlled. Continue current medications.  3) Lipids/HPL:  she is on Simvastatin 20 mg po qhs. 4)  Weight/Diet: CDE consult in progress, exercise, and carbohydrates information provided.  5) Chronic Care/Health Maintenance:  -Patient is on  Statin medications and encouraged to continue to follow up with Ophthalmology, Podiatrist at least yearly or according to recommendations, and advised to  stay away from smoking. I have recommended yearly flu vaccine and pneumonia vaccination at least every 5 years; moderate intensity exercise for up to 150 minutes weekly; and  sleep for at least 7 hours a day.  - 25 minutes of time was spent on the care of this patient , 50% of which was applied for counseling on diabetes complications and their preventions.  - I advised patient to maintain close follow up with [name], MD for primary care needs.  Patient is asked to bring meter and  blood glucose logs during their next visit.   Follow up plan: -Return in about 3 months  (around 08/06/2016) for follow up with pre-visit labs, meter, and logs.  Glade Lloyd, MD Phone: [phone]  Fax: [phone]   05/09/2016, 10:43 AM

## 2016-05-30 NOTE — Progress Notes (Signed)
Cardiology Office Note  Date: 05/31/2016   ID: [name], [name] 1935-01-25, MRN [id number]  PCP: [name], MD  Primary Cardiologist: [name], MD   Chief Complaint  Patient presents with  . Atrial Fibrillation    History of Present Illness: [name] is a 82 y.o. female last seen in May 2017. She presents for a routine follow-up visit. Reports no significant palpitations or chest pain. States that she has been compliant with her medications.  She continues to follow in the anticoagulation clinic on Coumadin. Recent INR was therapeutic. She has had no spontaneous bleeding problems. I reviewed her ECG today which shows rate-controlled atrial fibrillation with low voltage.  Current medications reviewed. Cardiac regimen includes Lopressor and Coumadin.  Past Medical History:  Diagnosis Date  . Anxiety   . Asthma   . Chronic back pain   . Colonoscopy causing post-procedural bleeding    Incomplete. by Dr. [name] 06/11/99 due to fixed non-compliant colon precluded exam to 40 cm, she had subsequent colectomy for diverticulitis since that time.   . Diarrhea   . Esophageal reflux   . Essential hypertension, benign   . Fibromyalgia   . Hiatal hernia    Recent EGD/TCS showed small hiatal hernia, normal apperaring tubular esophagus. s/p passage of a 54-french Maloney dilator, s/p biopsy esophageal mucosa, multiple fundal gland type gastric polyps. one large pedunculated polp with oozing, noted s/p clipping and snare polypectomy. Biopsies of the gastric mucosa taken given her symptoms in her elevated count. normal D1-D3, s/p biospy D2-D3.   [name] Hiatal hernia    all bx unremarkable. on TCS she had left-sided diverticula, evidence of prior segmental resection with anastomosis, multiple colonic polyps s/p multiple snare polypectomies, s/p segmental biopsy and stool sampling. she had multiple tubular adenomas. no microscopic/collagenous colitis. stool culture, c diff, O+P,  lactoferrin were negative.   . Mixed hyperlipidemia   . Nephrolithiasis   . OSA (obstructive sleep apnea)   . Permanent atrial fibrillation (HCC)    Previous failed DCCV  . Type 2 diabetes mellitus ([place])   . Ureteral stent retained    Not retained. ureteral stent removal gross hematuria resolved 10/11. coumadin restarted.   . Ventral hernia     Current Outpatient Prescriptions  Medication Sig Dispense Refill  . acetaminophen (TYLENOL) 325 MG tablet Take 650 mg by mouth every 6 (six) hours as needed for pain.     [name] ALPRAZolam (XANAX) 1 MG tablet Take 0.5 mg by mouth 2 (two) times daily as needed for anxiety.     [name] amLODipine (NORVASC) 10 MG tablet Take 10 mg by mouth daily.    . cloNIDine (CATAPRES - DOSED IN MG/24 HR) 0.1 mg/24hr patch APPLY 1 PATCH ONCE A WEEK AS DIRECTED 4 patch 6  . dicyclomine (BENTYL) 10 MG capsule Take two in am and one at bedtime for diarrhea. Hold for constipation. 90 capsule 5  . HUMALOG KWIKPEN 100 UNIT/ML KiwkPen INJECT 5-11 UNITS INTO THE SKIN THREE TIMES DAILY 15 mL 2  . hydrochlorothiazide 50 MG tablet Take 25 mg by mouth daily.     [name] LANTUS SOLOSTAR 100 UNIT/ML Solostar Pen INJECT 26 UNITS INTO THE SKIN DAILY AT 10 PM. 15 mL 2  . metoprolol tartrate (LOPRESSOR) 25 MG tablet TAKE 1 TABLET BY MOUTH TWICE DAILY 60 tablet 6  . nitroGLYCERIN (NITROSTAT) 0.4 MG SL tablet Place 1 tablet (0.4 mg total) under the tongue every 5 (five) minutes x 3 doses as needed  for chest pain. If no relief after the 3 rd dose, proceed to the ED for an evaluation 90 tablet 3  . nystatin cream (MYCOSTATIN) Apply 1 application topically 2 (two) times daily. Continue for two days after resolution of symptoms. 60 g 0  . ondansetron (ZOFRAN-ODT) 4 MG disintegrating tablet Take 4 mg by mouth every 8 (eight) hours as needed. Nausea and Vomiting    . oxyCODONE (ROXICODONE) 15 MG immediate release tablet Take 15 mg by mouth every 6 (six) hours as needed. Pain    . pantoprazole (PROTONIX) 40  MG tablet TAKE 1 TABLET BY MOUTH TWICE DAILY 60 tablet 5  . simvastatin (ZOCOR) 20 MG tablet Take 1 tablet by mouth daily.    . tizanidine (ZANAFLEX) 2 MG capsule Take 2 mg by mouth 3 (three) times daily as needed for muscle spasms.     [name] UNABLE TO FIND Med Name: diclofenac-gabapentin-lidocaine- compounded lotion for back    . VICTOZA 18 MG/3ML SOPN INJECT 0.3 MLS (1.8 MG TOTAL) INTO THE SKIN DAILY 9 mL 2  . warfarin (COUMADIN) 5 MG tablet TAKE 1/2 TABLET BY MOUTH EVERY DAY, EXCEPT TAKE 1 TABLET BY MOUTH ON MONDAY, WED AND FRIDAY OR AS DIRECTED BY COUMADIN CLINIC 30 tablet 3   No current facility-administered medications for this visit.    Allergies:  Adhesive [tape]; Demerol [meperidine]; Iohexol; Meperidine hcl; Shellfish allergy; and Penicillins   Social History: The patient  reports that she has never smoked. She has never used smokeless tobacco. She reports that she does not drink alcohol or use drugs.   Family History: The patient's family history includes Colon cancer in her maternal grandfather.   ROS:  Please see the history of present illness. Otherwise, complete review of systems is positive for none.  All other systems are reviewed and negative.   Physical Exam: VS:  BP (!) 142/83   Pulse (!) 58   Ht _0  (1.6 m)   Wt 211 lb (95.7 kg)   SpO2 98%   BMI 37.38 kg/m , BMI Body mass index is 37.38 kg/m.  Wt Readings from Last 3 Encounters:  05/31/16 211 lb (95.7 kg)  05/09/16 209 lb (94.8 kg)  02/16/16 207 lb 12.8 oz (94.3 kg)    Overweight woman in no acute distress.  HEENT: Conjunctiva and lids normal, oropharynx clear.  Neck: Supple, no elevated JVP or carotid bruits, no thyromegaly.  Lungs: Clear to auscultation, nonlabored breathing at rest.  Cardiac: Irregularly irregular, no S3 or significant systolic murmur.  Abdomen: Soft, nontender,bowel sounds present.  Extremities: Trace edema, distal pulses 2+.   ECG: I personally reviewed the tracing from 08/16/2014  which showed rate-controlled atrial fibrillation with nonspecific T-wave changes and low voltage.  Recent Labwork: 01/30/2016: ALT 11; AST 17; BUN 21; Creat 1.53; Potassium 4.4; Sodium 141     Component Value Date/Time   CHOL 154 05/06/2016   TRIG 210 (A) 05/06/2016   HDL 31 (A) 05/06/2016   CHOLHDL 4.6 07/11/2009 2342   VLDL 35 07/11/2009 2342   LDLCALC 93 05/06/2016    Other Studies Reviewed Today:  Echocardiogram 11/16/2009 [facility]): Mild to moderate LVH with LVEF 55-60%, mild to moderate left atrial enlargement, MAC with mild mitral regurgitation, mildly thickened aortic leaflets, trace tricuspid regurgitation, trivial pericardial effusion.  Assessment and Plan:  1. Chronic atrial fibrillation, symptomatically stable without palpitations on current regimen. No changes made today. Keep follow-up in anticoagulation clinic.  2. Hyperlipidemia, on Zocor. Followed by Dr. [name]. Recent  LDL 93.  Current medicines were reviewed with the patient today.   Orders Placed This Encounter  Procedures  . EKG 12-Lead    Disposition: Follow-up in 6 months.  Signed, [name], MD, [facility] 05/31/2016 10:54 AM    [facility] [place], [place], [place] [address] Phone: [phone]; Fax: [phone]

## 2016-05-31 ENCOUNTER — Encounter: Payer: Self-pay | Admitting: Cardiology

## 2016-05-31 ENCOUNTER — Ambulatory Visit (INDEPENDENT_AMBULATORY_CARE_PROVIDER_SITE_OTHER): Payer: Medicare Other | Admitting: Cardiology

## 2016-05-31 VITALS — BP 142/83 | HR 58 | Ht 63.0 in | Wt 211.0 lb

## 2016-05-31 DIAGNOSIS — I482 Chronic atrial fibrillation, unspecified: Secondary | ICD-10-CM

## 2016-05-31 DIAGNOSIS — E782 Mixed hyperlipidemia: Secondary | ICD-10-CM

## 2016-05-31 NOTE — Patient Instructions (Signed)

## 2016-06-06 ENCOUNTER — Other Ambulatory Visit: Payer: Self-pay | Admitting: Cardiology

## 2016-06-07 DIAGNOSIS — M545 Low back pain: Secondary | ICD-10-CM | POA: Diagnosis not present

## 2016-06-07 DIAGNOSIS — E119 Type 2 diabetes mellitus without complications: Secondary | ICD-10-CM | POA: Diagnosis not present

## 2016-06-07 DIAGNOSIS — M25552 Pain in left hip: Secondary | ICD-10-CM | POA: Diagnosis not present

## 2016-06-07 DIAGNOSIS — K219 Gastro-esophageal reflux disease without esophagitis: Secondary | ICD-10-CM | POA: Diagnosis not present

## 2016-06-07 DIAGNOSIS — E785 Hyperlipidemia, unspecified: Secondary | ICD-10-CM | POA: Diagnosis not present

## 2016-06-07 DIAGNOSIS — M797 Fibromyalgia: Secondary | ICD-10-CM | POA: Diagnosis not present

## 2016-06-07 DIAGNOSIS — Z79891 Long term (current) use of opiate analgesic: Secondary | ICD-10-CM | POA: Diagnosis not present

## 2016-06-07 DIAGNOSIS — G8929 Other chronic pain: Secondary | ICD-10-CM | POA: Diagnosis not present

## 2016-06-07 DIAGNOSIS — G4701 Insomnia due to medical condition: Secondary | ICD-10-CM | POA: Diagnosis not present

## 2016-06-07 DIAGNOSIS — I10 Essential (primary) hypertension: Secondary | ICD-10-CM | POA: Diagnosis not present

## 2016-06-07 DIAGNOSIS — G43909 Migraine, unspecified, not intractable, without status migrainosus: Secondary | ICD-10-CM | POA: Diagnosis not present

## 2016-06-07 DIAGNOSIS — M25511 Pain in right shoulder: Secondary | ICD-10-CM | POA: Diagnosis not present

## 2016-06-13 ENCOUNTER — Ambulatory Visit (INDEPENDENT_AMBULATORY_CARE_PROVIDER_SITE_OTHER): Payer: Medicare Other | Admitting: *Deleted

## 2016-06-13 DIAGNOSIS — I4821 Permanent atrial fibrillation: Secondary | ICD-10-CM

## 2016-06-13 DIAGNOSIS — I482 Chronic atrial fibrillation: Secondary | ICD-10-CM

## 2016-06-13 DIAGNOSIS — Z5181 Encounter for therapeutic drug level monitoring: Secondary | ICD-10-CM

## 2016-06-13 LAB — POCT INR: INR: 2.1

## 2016-06-14 DIAGNOSIS — M25552 Pain in left hip: Secondary | ICD-10-CM | POA: Diagnosis not present

## 2016-06-14 DIAGNOSIS — I10 Essential (primary) hypertension: Secondary | ICD-10-CM | POA: Diagnosis not present

## 2016-06-14 DIAGNOSIS — M25511 Pain in right shoulder: Secondary | ICD-10-CM | POA: Diagnosis not present

## 2016-06-14 DIAGNOSIS — E119 Type 2 diabetes mellitus without complications: Secondary | ICD-10-CM | POA: Diagnosis not present

## 2016-06-14 DIAGNOSIS — M797 Fibromyalgia: Secondary | ICD-10-CM | POA: Diagnosis not present

## 2016-06-14 DIAGNOSIS — G8929 Other chronic pain: Secondary | ICD-10-CM | POA: Diagnosis not present

## 2016-06-14 DIAGNOSIS — E785 Hyperlipidemia, unspecified: Secondary | ICD-10-CM | POA: Diagnosis not present

## 2016-06-14 DIAGNOSIS — M545 Low back pain: Secondary | ICD-10-CM | POA: Diagnosis not present

## 2016-06-14 DIAGNOSIS — K219 Gastro-esophageal reflux disease without esophagitis: Secondary | ICD-10-CM | POA: Diagnosis not present

## 2016-06-14 DIAGNOSIS — Z79891 Long term (current) use of opiate analgesic: Secondary | ICD-10-CM | POA: Diagnosis not present

## 2016-06-14 DIAGNOSIS — G43909 Migraine, unspecified, not intractable, without status migrainosus: Secondary | ICD-10-CM | POA: Diagnosis not present

## 2016-06-14 DIAGNOSIS — G4701 Insomnia due to medical condition: Secondary | ICD-10-CM | POA: Diagnosis not present

## 2016-06-20 DIAGNOSIS — E559 Vitamin D deficiency, unspecified: Secondary | ICD-10-CM | POA: Diagnosis not present

## 2016-06-20 DIAGNOSIS — N183 Chronic kidney disease, stage 3 (moderate): Secondary | ICD-10-CM | POA: Diagnosis not present

## 2016-06-20 DIAGNOSIS — R809 Proteinuria, unspecified: Secondary | ICD-10-CM | POA: Diagnosis not present

## 2016-06-20 DIAGNOSIS — D509 Iron deficiency anemia, unspecified: Secondary | ICD-10-CM | POA: Diagnosis not present

## 2016-06-20 DIAGNOSIS — Z79899 Other long term (current) drug therapy: Secondary | ICD-10-CM | POA: Diagnosis not present

## 2016-06-20 DIAGNOSIS — I129 Hypertensive chronic kidney disease with stage 1 through stage 4 chronic kidney disease, or unspecified chronic kidney disease: Secondary | ICD-10-CM | POA: Diagnosis not present

## 2016-06-25 DIAGNOSIS — N183 Chronic kidney disease, stage 3 (moderate): Secondary | ICD-10-CM | POA: Diagnosis not present

## 2016-06-25 DIAGNOSIS — N2581 Secondary hyperparathyroidism of renal origin: Secondary | ICD-10-CM | POA: Diagnosis not present

## 2016-06-25 DIAGNOSIS — I10 Essential (primary) hypertension: Secondary | ICD-10-CM | POA: Diagnosis not present

## 2016-06-25 DIAGNOSIS — E559 Vitamin D deficiency, unspecified: Secondary | ICD-10-CM | POA: Diagnosis not present

## 2016-06-28 DIAGNOSIS — E1065 Type 1 diabetes mellitus with hyperglycemia: Secondary | ICD-10-CM | POA: Diagnosis not present

## 2016-06-28 DIAGNOSIS — E103293 Type 1 diabetes mellitus with mild nonproliferative diabetic retinopathy without macular edema, bilateral: Secondary | ICD-10-CM | POA: Diagnosis not present

## 2016-06-28 DIAGNOSIS — Z794 Long term (current) use of insulin: Secondary | ICD-10-CM | POA: Diagnosis not present

## 2016-06-28 DIAGNOSIS — H35371 Puckering of macula, right eye: Secondary | ICD-10-CM | POA: Diagnosis not present

## 2016-07-08 ENCOUNTER — Other Ambulatory Visit: Payer: Self-pay | Admitting: "Endocrinology

## 2016-07-23 ENCOUNTER — Ambulatory Visit (INDEPENDENT_AMBULATORY_CARE_PROVIDER_SITE_OTHER): Payer: Medicare Other | Admitting: *Deleted

## 2016-07-23 DIAGNOSIS — I482 Chronic atrial fibrillation: Secondary | ICD-10-CM | POA: Diagnosis not present

## 2016-07-23 DIAGNOSIS — I4821 Permanent atrial fibrillation: Secondary | ICD-10-CM

## 2016-07-23 DIAGNOSIS — Z5181 Encounter for therapeutic drug level monitoring: Secondary | ICD-10-CM | POA: Diagnosis not present

## 2016-07-23 LAB — POCT INR: INR: 2.8

## 2016-08-05 DIAGNOSIS — K219 Gastro-esophageal reflux disease without esophagitis: Secondary | ICD-10-CM | POA: Diagnosis not present

## 2016-08-05 DIAGNOSIS — M545 Low back pain: Secondary | ICD-10-CM | POA: Diagnosis not present

## 2016-08-05 DIAGNOSIS — Z79891 Long term (current) use of opiate analgesic: Secondary | ICD-10-CM | POA: Diagnosis not present

## 2016-08-05 DIAGNOSIS — M25511 Pain in right shoulder: Secondary | ICD-10-CM | POA: Diagnosis not present

## 2016-08-05 DIAGNOSIS — G4701 Insomnia due to medical condition: Secondary | ICD-10-CM | POA: Diagnosis not present

## 2016-08-05 DIAGNOSIS — E119 Type 2 diabetes mellitus without complications: Secondary | ICD-10-CM | POA: Diagnosis not present

## 2016-08-05 DIAGNOSIS — M797 Fibromyalgia: Secondary | ICD-10-CM | POA: Diagnosis not present

## 2016-08-05 DIAGNOSIS — G8929 Other chronic pain: Secondary | ICD-10-CM | POA: Diagnosis not present

## 2016-08-05 DIAGNOSIS — I10 Essential (primary) hypertension: Secondary | ICD-10-CM | POA: Diagnosis not present

## 2016-08-05 DIAGNOSIS — G43909 Migraine, unspecified, not intractable, without status migrainosus: Secondary | ICD-10-CM | POA: Diagnosis not present

## 2016-08-05 DIAGNOSIS — M25552 Pain in left hip: Secondary | ICD-10-CM | POA: Diagnosis not present

## 2016-08-05 DIAGNOSIS — E785 Hyperlipidemia, unspecified: Secondary | ICD-10-CM | POA: Diagnosis not present

## 2016-08-08 ENCOUNTER — Ambulatory Visit: Payer: Self-pay | Admitting: "Endocrinology

## 2016-08-15 ENCOUNTER — Encounter: Payer: Self-pay | Admitting: Gastroenterology

## 2016-08-15 ENCOUNTER — Other Ambulatory Visit: Payer: Self-pay | Admitting: "Endocrinology

## 2016-08-15 ENCOUNTER — Other Ambulatory Visit: Payer: Self-pay

## 2016-08-15 ENCOUNTER — Ambulatory Visit (INDEPENDENT_AMBULATORY_CARE_PROVIDER_SITE_OTHER): Payer: Medicare Other | Admitting: Gastroenterology

## 2016-08-15 VITALS — BP 115/77 | HR 65 | Temp 96.7°F | Ht 63.0 in | Wt 209.2 lb

## 2016-08-15 DIAGNOSIS — R1031 Right lower quadrant pain: Secondary | ICD-10-CM

## 2016-08-15 DIAGNOSIS — K580 Irritable bowel syndrome with diarrhea: Secondary | ICD-10-CM

## 2016-08-15 DIAGNOSIS — G8929 Other chronic pain: Secondary | ICD-10-CM

## 2016-08-15 DIAGNOSIS — E1122 Type 2 diabetes mellitus with diabetic chronic kidney disease: Secondary | ICD-10-CM | POA: Diagnosis not present

## 2016-08-15 DIAGNOSIS — K219 Gastro-esophageal reflux disease without esophagitis: Secondary | ICD-10-CM

## 2016-08-15 DIAGNOSIS — N183 Chronic kidney disease, stage 3 (moderate): Secondary | ICD-10-CM | POA: Diagnosis not present

## 2016-08-15 LAB — COMPREHENSIVE METABOLIC PANEL
ALK PHOS: 77 U/L (ref 33–130)
ALT: 10 U/L (ref 6–29)
AST: 15 U/L (ref 10–35)
Albumin: 4 g/dL (ref 3.6–5.1)
BUN: 40 mg/dL — AB (ref 7–25)
CALCIUM: 9 mg/dL (ref 8.6–10.4)
CO2: 30 mmol/L (ref 20–31)
Chloride: 98 mmol/L (ref 98–110)
Creat: 2.03 mg/dL — ABNORMAL HIGH (ref 0.60–0.93)
GLUCOSE: 200 mg/dL — AB (ref 65–99)
POTASSIUM: 4.3 mmol/L (ref 3.5–5.3)
Sodium: 135 mmol/L (ref 135–146)
Total Bilirubin: 0.7 mg/dL (ref 0.2–1.2)
Total Protein: 7 g/dL (ref 6.1–8.1)

## 2016-08-15 NOTE — Patient Instructions (Signed)
1. Please continue bentyl, try once in morning and once in evening. 2. CT scan as scheduled.  3. Call if progressive symptoms while we are waiting on CT.

## 2016-08-15 NOTE — Assessment & Plan Note (Signed)
Doing well on pantoprazole. Requires twice a day dosing due to refractory symptoms on once daily dosing. Continue antireflux measures.

## 2016-08-15 NOTE — Assessment & Plan Note (Signed)
  Right lower quadrant pain for several months. History of right-sided hydronephrosis couple years ago. Chronic obstruction at [place]. Plan for CT abdomen pelvis with contrast although we may need to provide oral contrast only for creatinine is elevated. Patient gives history of chronic renal insufficiency. Obtain recent labs from Dr. Dorris Fetch. They were just done today.
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
## 2016-08-16 LAB — HEMOGLOBIN A1C
Hgb A1c MFr Bld: 8.3 % — ABNORMAL HIGH (ref <5.7)
Mean Plasma Glucose: 192 mg/dL

## 2016-08-19 ENCOUNTER — Other Ambulatory Visit: Payer: Self-pay

## 2016-08-19 ENCOUNTER — Telehealth: Payer: Self-pay | Admitting: Gastroenterology

## 2016-08-19 DIAGNOSIS — R1031 Right lower quadrant pain: Principal | ICD-10-CM

## 2016-08-19 DIAGNOSIS — G8929 Other chronic pain: Secondary | ICD-10-CM

## 2016-08-19 NOTE — Telephone Encounter (Signed)
New ordered entered for CT abd/pelvis without contrast. Called and informed Central Scheduling, appt changed to new order.

## 2016-08-19 NOTE — Telephone Encounter (Signed)
Please change patient's scheduled CT A/P to ORAL CONTRAST ONLY.  Her Creatinine last week was over 2.

## 2016-08-21 ENCOUNTER — Encounter: Payer: Self-pay | Admitting: "Endocrinology

## 2016-08-21 ENCOUNTER — Ambulatory Visit (INDEPENDENT_AMBULATORY_CARE_PROVIDER_SITE_OTHER): Payer: Medicare Other | Admitting: "Endocrinology

## 2016-08-21 VITALS — BP 120/72 | HR 67 | Ht 63.0 in | Wt 210.0 lb

## 2016-08-21 DIAGNOSIS — E782 Mixed hyperlipidemia: Secondary | ICD-10-CM | POA: Diagnosis not present

## 2016-08-21 DIAGNOSIS — N183 Chronic kidney disease, stage 3 unspecified: Secondary | ICD-10-CM

## 2016-08-21 DIAGNOSIS — E1122 Type 2 diabetes mellitus with diabetic chronic kidney disease: Secondary | ICD-10-CM | POA: Diagnosis not present

## 2016-08-21 DIAGNOSIS — I10 Essential (primary) hypertension: Secondary | ICD-10-CM | POA: Diagnosis not present

## 2016-08-21 NOTE — Progress Notes (Signed)
Subjective:    Patient ID: [name], female    DOB: 11-17-1934, PCP [name], MD   Past Medical History:  Diagnosis Date  . Anxiety   . Asthma   . Chronic back pain   . Colonoscopy causing post-procedural bleeding    Incomplete. by Dr. [name] 06/11/99 due to fixed non-compliant colon precluded exam to 40 cm, she had subsequent colectomy for diverticulitis since that time.   . Diarrhea   . Esophageal reflux   . Essential hypertension, benign   . Fibromyalgia   . Hiatal hernia    Recent EGD/TCS showed small hiatal hernia, normal apperaring tubular esophagus. s/p passage of a 54-french Maloney dilator, s/p biopsy esophageal mucosa, multiple fundal gland type gastric polyps. one large pedunculated polp with oozing, noted s/p clipping and snare polypectomy. Biopsies of the gastric mucosa taken given her symptoms in her elevated count. normal D1-D3, s/p biospy D2-D3.   [name] Hiatal hernia    all bx unremarkable. on TCS she had left-sided diverticula, evidence of prior segmental resection with anastomosis, multiple colonic polyps s/p multiple snare polypectomies, s/p segmental biopsy and stool sampling. she had multiple tubular adenomas. no microscopic/collagenous colitis. stool culture, c diff, O+P, lactoferrin were negative.   . Mixed hyperlipidemia   . Nephrolithiasis   . OSA (obstructive sleep apnea)   . Permanent atrial fibrillation (HCC)    Previous failed DCCV  . Type 2 diabetes mellitus ([place])   . Ureteral stent retained    Not retained. ureteral stent removal gross hematuria resolved 10/11. coumadin restarted.   . Ventral hernia    Past Surgical History:  Procedure Laterality Date  . BREAST LUMPECTOMY     benign cyst  . CATARACT EXTRACTION W/PHACO Left 08/17/2012   Procedure: CATARACT EXTRACTION PHACO AND INTRAOCULAR LENS PLACEMENT (IOC);  Surgeon: [name], MD;  Location: [facility];  Service: Ophthalmology;  Laterality: Left;  CDE:18.73  . COLECTOMY     2005. Dr.  Geoffry Paradise for diverticulitis  . COLONOSCOPY  02/22/09   normal/left-sided diverticula/multiple colonic polyp, adenomatous  . COLONOSCOPY  12/16/2011   colonic polyps-treated as described above. Status postprior sigmoid resection. adenomatous. next TCS 12/2014  . COLONOSCOPY N/A 12/28/2014   Status post prior segmental resection. Few residual colonic diverticula- status post segmental biopsy negative random colon biopsies  . ESOPHAGEAL DILATION N/A 12/28/2014   Procedure: ESOPHAGEAL DILATION;  Surgeon: [name], MD;  Location: [facility];  Service: Endoscopy;  Laterality: N/A;  . ESOPHAGOGASTRODUODENOSCOPY  02/22/09   normal s/p dilator;small HH/one large pedunculates polyp  s/p clipping, hyperplastic  . ESOPHAGOGASTRODUODENOSCOPY  12/16/2011   [name]: small hiatal hernia. Hyperplastic apperating polyps. Status post [name] dilation as described above.  . ESOPHAGOGASTRODUODENOSCOPY N/A 12/28/2014   RMR: Normal-appearing esophagus status post passage of a maloney dilator. Hiatal hernia. abnormal gastic mucosa of uncertain significance status post gastric biopsy with mild chronic gastritis, no H pylori.   . TONSILLECTOMY    . TOTAL ABDOMINAL HYSTERECTOMY     Social History   Social History  . Marital status: Widowed    Spouse name: N/A  . Number of children: N/A  . Years of education: N/A   Social History Main Topics  . Smoking status: Never Smoker  . Smokeless tobacco: Never Used     Comment: tobacco use - no  . Alcohol use No  . Drug use: No  . Sexual activity: Not Asked   Other Topics Concern  . None   Social  History Narrative  . None   Outpatient Encounter Prescriptions as of 08/21/2016  Medication Sig  . acetaminophen (TYLENOL) 325 MG tablet Take 650 mg by mouth every 6 (six) hours as needed for pain.   [name] ALPRAZolam (XANAX) 1 MG tablet Take 0.5 mg by mouth 2 (two) times daily as needed for anxiety.   [name] amLODipine (NORVASC) 10 MG tablet Take 10 mg by mouth daily.  .  cloNIDine (CATAPRES - DOSED IN MG/24 HR) 0.1 mg/24hr patch APPLY 1 PATCH ONCE A WEEK AS DIRECTED  . dicyclomine (BENTYL) 10 MG capsule Take two in am and one at bedtime for diarrhea. Hold for constipation.  [name] HUMALOG KWIKPEN 100 UNIT/ML KiwkPen INJECT 5-11 UNITS INTO THE SKIN THREE TIMES DAILY  . hydrochlorothiazide 50 MG tablet Take 25 mg by mouth daily.   [name] LANTUS SOLOSTAR 100 UNIT/ML Solostar Pen INJECT 26 UNITS INTO THE SKIN DAILY AT 10 PM.  . Melatonin 1 MG TABS Take 1 mg by mouth as needed.  . metoprolol tartrate (LOPRESSOR) 25 MG tablet TAKE 1 TABLET BY MOUTH TWICE DAILY  . nitroGLYCERIN (NITROSTAT) 0.4 MG SL tablet Place 1 tablet (0.4 mg total) under the tongue every 5 (five) minutes x 3 doses as needed for chest pain. If no relief after the 3 rd dose, proceed to the ED for an evaluation  . nystatin cream (MYCOSTATIN) Apply 1 application topically 2 (two) times daily. Continue for two days after resolution of symptoms.  . ondansetron (ZOFRAN-ODT) 4 MG disintegrating tablet Take 4 mg by mouth every 8 (eight) hours as needed. Nausea and Vomiting  . Oxycodone HCl 10 MG TABS Take 10 mg by mouth as needed.  . pantoprazole (PROTONIX) 40 MG tablet TAKE 1 TABLET BY MOUTH TWICE DAILY  . simvastatin (ZOCOR) 20 MG tablet Take 1 tablet by mouth daily.  . tizanidine (ZANAFLEX) 2 MG capsule Take 2 mg by mouth 3 (three) times daily as needed for muscle spasms.   [name] VICTOZA 18 MG/3ML SOPN INJECT 0.3 MLS (1.8 MG TOTAL) INTO THE SKIN DAILY  . warfarin (COUMADIN) 5 MG tablet TAKE 1/2 TABLET BY MOUTH EVERY DAY EXCEPT TAKE 1 TABLET ON MONDAY, WEDNESDAY, AND FRIDAY OR AS DIRECTED BY COUMADIN CLINIC   No facility-administered encounter medications on file as of 08/21/2016.    ALLERGIES: Allergies  Allergen Reactions  . Adhesive [Tape] Itching    Pulls skin off.   . Demerol [Meperidine]     Causes Vomiting  . Iohexol Nausea And Vomiting  . Meperidine Hcl Nausea And Vomiting  . Shellfish Allergy Diarrhea  and Nausea And Vomiting  . Penicillins Nausea And Vomiting and Rash   VACCINATION STATUS:  There is no immunization history on file for this patient.  Diabetes  She presents for her follow-up diabetic visit. She has type 2 diabetes mellitus. Onset time: She was diagnosed at approximate age of 24 years. Her disease course has been worsening. There are no hypoglycemic associated symptoms. Pertinent negatives for hypoglycemia include no confusion, headaches, pallor or seizures. (She has a few random mild hypoglycemia episodes.) There are no diabetic associated symptoms. Pertinent negatives for diabetes include no chest pain, no polydipsia, no polyphagia and no polyuria. There are no hypoglycemic complications. Symptoms are worsening. Diabetic complications include nephropathy. Risk factors for coronary artery disease include diabetes mellitus, dyslipidemia, hypertension, obesity and sedentary lifestyle. Current diabetic treatment includes intensive insulin program. She is compliant with treatment most of the time. Her weight is stable. She is following a generally  unhealthy diet. She has had a previous visit with a dietitian. Her home blood glucose trend is decreasing steadily. Her breakfast blood glucose range is generally 130-140 mg/dl. Her lunch blood glucose range is generally 130-140 mg/dl. Her dinner blood glucose range is generally 130-140 mg/dl. Her overall blood glucose range is 130-140 mg/dl. Eye exam is current.  Hyperlipidemia  This is a chronic problem. The current episode started more than 1 year ago. Exacerbating diseases include diabetes. Pertinent negatives include no chest pain, myalgias or shortness of breath. Current antihyperlipidemic treatment includes statins. Risk factors for coronary artery disease include diabetes mellitus, dyslipidemia, hypertension, obesity and a sedentary lifestyle.  Hypertension  This is a chronic problem. The current episode started more than 1 year ago.  Pertinent negatives include no chest pain, headaches, palpitations or shortness of breath. Risk factors for coronary artery disease include diabetes mellitus, dyslipidemia, obesity and sedentary lifestyle.     Review of Systems  Constitutional: Negative for chills, fever and unexpected weight change.  HENT: Negative for trouble swallowing and voice change.   Eyes: Negative for visual disturbance.  Respiratory: Negative for cough, shortness of breath and wheezing.   Cardiovascular: Negative for chest pain, palpitations and leg swelling.  Gastrointestinal: Negative for diarrhea, nausea and vomiting.  Endocrine: Negative for cold intolerance, heat intolerance, polydipsia, polyphagia and polyuria.  Musculoskeletal: Negative for arthralgias and myalgias.  Skin: Negative for color change, pallor, rash and wound.  Neurological: Negative for seizures and headaches.  Psychiatric/Behavioral: Negative for confusion and suicidal ideas.    Objective:    BP 120/72   Pulse 67   Ht 5\' 3"  (1.6 m)   Wt 210 lb (95.3 kg)   BMI 37.20 kg/m   Wt Readings from Last 3 Encounters:  08/21/16 210 lb (95.3 kg)  08/15/16 209 lb 3.2 oz (94.9 kg)  05/31/16 211 lb (95.7 kg)    Physical Exam  Constitutional: She is oriented to person, place, and time. She appears well-developed.  HENT:  Head: Normocephalic and atraumatic.  Eyes: EOM are normal.  Neck: Normal range of motion. Neck supple. No tracheal deviation present. No thyromegaly present.  Cardiovascular: Normal rate and regular rhythm.   Pulmonary/Chest: Effort normal and breath sounds normal.  Abdominal: Soft. Bowel sounds are normal. There is no tenderness. There is no guarding.  Musculoskeletal: Normal range of motion. She exhibits no edema.  Neurological: She is alert and oriented to person, place, and time. She has normal reflexes. No cranial nerve deficit. Coordination normal.  Skin: Skin is warm and dry. No rash noted. No erythema. No pallor.   Psychiatric: She has a normal mood and affect. Judgment normal.    CMP     Component Value Date/Time   NA 135 08/15/2016 0959   K 4.3 08/15/2016 0959   CL 98 08/15/2016 0959   CO2 30 08/15/2016 0959   GLUCOSE 200 (H) 08/15/2016 0959   BUN 40 (H) 08/15/2016 0959   CREATININE 2.03 (H) 08/15/2016 0959   CALCIUM 9.0 08/15/2016 0959   PROT 7.0 08/15/2016 0959   ALBUMIN 4.0 08/15/2016 0959   AST 15 08/15/2016 0959   ALT 10 08/15/2016 0959   ALKPHOS 77 08/15/2016 0959   BILITOT 0.7 08/15/2016 0959   GFRNONAA 32 (L) 01/30/2016 1015   GFRAA 37 (L) 01/30/2016 1015     Diabetic Labs (most recent): Lab Results  Component Value Date   HGBA1C 8.3 (H) 08/15/2016   HGBA1C 8.1 05/06/2016   HGBA1C 7.4 (H) 01/30/2016  Lipid Panel     Component Value Date/Time   CHOL 154 05/06/2016   TRIG 210 (A) 05/06/2016   HDL 31 (A) 05/06/2016   CHOLHDL 4.6 07/11/2009 2342   VLDL 35 07/11/2009 2342   LDLCALC 93 05/06/2016      Assessment & Plan:   1. Diabetes mellitus with stage 3 chronic kidney disease ([place])  -Patient remains at a high risk for more acute and chronic complications of diabetes which include CAD, CVA, CKD, retinopathy, and neuropathy. These are all discussed in detail with the patient.  Patient came with  above target glucose profile and her A1c has increased to 8.3%  From 7.9%. She misread her Humalog  Prescription and stayed on 5 units TIDAC instead of 8 units TIDAC.  - Glucose logs and insulin administration records pertaining to this visit,  to be scanned into patient's records.  Recent labs reviewed.   - I have re-counseled the patient on diet management and weight loss  by adopting a carbohydrate restricted / protein [name]  Diet.  - Suggestion is made for patient to avoid simple carbohydrates   from their diet including Cakes , Desserts, Ice Cream,  Soda (  diet and regular) , Sweet Tea , Candies,  Chips, Cookies, Artificial Sweeteners,   and "Sugar-free" Products .   This will help patient to have stable blood glucose profile and potentially avoid unintended  Weight gain.  - Patient is advised to stick to a routine mealtimes to eat 3 meals  a day and avoid unnecessary snacks ( to snack only to correct hypoglycemia).  - The patient  has been  scheduled with Jearld Fenton, RDN, CDE for individualized DM education.  - I have approached patient with the following individualized plan to manage diabetes and patient agrees.  - I will continue Lantus  26 units QHS,  Humalog  8 units TIDAC plus SSI , - continue Victoza 1.8mg  daily . -Side effects of Victoza is discussed with the patient.   -Patient is not a candidate for metformin and SGLT2 inhibitors due to CKD.  - Patient specific target  for A1c; LDL, HDL, Triglycerides, and  Waist Circumference were discussed in detail.  2) BP/HTN: Controlled. Continue current medications.  3) Lipids/HPL:  she is on Simvastatin 20 mg po qhs. 4)  Weight/Diet: CDE consult in progress, exercise, and carbohydrates information provided.  5) Chronic Care/Health Maintenance:  -Patient is on  Statin medications and encouraged to continue to follow up with Ophthalmology, Podiatrist at least yearly or according to recommendations, and advised to  stay away from smoking. I have recommended yearly flu vaccine and pneumonia vaccination at least every 5 years; moderate intensity exercise for up to 150 minutes weekly; and  sleep for at least 7 hours a day.  - 25 minutes of time was spent on the care of this patient , 50% of which was applied for counseling on diabetes complications and their preventions.  - I advised patient to maintain close follow up with [name], MD for primary care needs.  Patient is asked to bring meter and  blood glucose logs during their next visit.   Follow up plan: -Return in about 3 months (around 11/21/2016) for follow up with pre-visit labs, meter, and logs.  Glade Lloyd, MD Phone: [phone]   Fax: [phone]   08/21/2016, 11:45 AM

## 2016-08-21 NOTE — Patient Instructions (Signed)

## 2016-08-27 ENCOUNTER — Ambulatory Visit (HOSPITAL_COMMUNITY)
Admission: RE | Admit: 2016-08-27 | Discharge: 2016-08-27 | Disposition: A | Discharge status: Home / Self Care | Payer: Medicare Other | Source: Ambulatory Visit | Attending: Gastroenterology | Admitting: Gastroenterology

## 2016-08-27 DIAGNOSIS — R1031 Right lower quadrant pain: Secondary | ICD-10-CM | POA: Diagnosis not present

## 2016-08-27 DIAGNOSIS — N1330 Unspecified hydronephrosis: Secondary | ICD-10-CM | POA: Insufficient documentation

## 2016-08-27 DIAGNOSIS — I700 Atherosclerosis of aorta: Secondary | ICD-10-CM | POA: Diagnosis not present

## 2016-08-27 DIAGNOSIS — M4317 Spondylolisthesis, lumbosacral region: Secondary | ICD-10-CM | POA: Diagnosis not present

## 2016-08-27 DIAGNOSIS — G8929 Other chronic pain: Secondary | ICD-10-CM | POA: Diagnosis not present

## 2016-08-29 NOTE — Progress Notes (Signed)
Please let patient know that she has stable severe right hydronephrosis dating back more than 7 years likely not cause of pain. H/O proven minimal functioning right kidney. No bowel issues.  How is she doing with increased bentyl?

## 2016-09-03 ENCOUNTER — Ambulatory Visit (INDEPENDENT_AMBULATORY_CARE_PROVIDER_SITE_OTHER): Payer: Medicare Other | Admitting: *Deleted

## 2016-09-03 DIAGNOSIS — Z5181 Encounter for therapeutic drug level monitoring: Secondary | ICD-10-CM

## 2016-09-03 DIAGNOSIS — I4821 Permanent atrial fibrillation: Secondary | ICD-10-CM

## 2016-09-03 DIAGNOSIS — I482 Chronic atrial fibrillation: Secondary | ICD-10-CM

## 2016-09-03 LAB — POCT INR: INR: 3.2

## 2016-09-05 NOTE — Progress Notes (Signed)
Patient made aware of results  Patient states she can't tell a difference in her symptoms, she's been taking Bentyl in the morning and at bedtime.    She thinks her symptoms are related to stress.  Routing to LL.

## 2016-09-26 ENCOUNTER — Other Ambulatory Visit: Payer: Self-pay | Admitting: "Endocrinology

## 2016-10-01 ENCOUNTER — Encounter: Payer: Self-pay | Admitting: *Deleted

## 2016-10-02 DIAGNOSIS — M25552 Pain in left hip: Secondary | ICD-10-CM | POA: Diagnosis not present

## 2016-10-02 DIAGNOSIS — E785 Hyperlipidemia, unspecified: Secondary | ICD-10-CM | POA: Diagnosis not present

## 2016-10-02 DIAGNOSIS — M545 Low back pain: Secondary | ICD-10-CM | POA: Diagnosis not present

## 2016-10-02 DIAGNOSIS — I10 Essential (primary) hypertension: Secondary | ICD-10-CM | POA: Diagnosis not present

## 2016-10-02 DIAGNOSIS — G43909 Migraine, unspecified, not intractable, without status migrainosus: Secondary | ICD-10-CM | POA: Diagnosis not present

## 2016-10-02 DIAGNOSIS — M797 Fibromyalgia: Secondary | ICD-10-CM | POA: Diagnosis not present

## 2016-10-02 DIAGNOSIS — G4701 Insomnia due to medical condition: Secondary | ICD-10-CM | POA: Diagnosis not present

## 2016-10-02 DIAGNOSIS — E119 Type 2 diabetes mellitus without complications: Secondary | ICD-10-CM | POA: Diagnosis not present

## 2016-10-02 DIAGNOSIS — G8929 Other chronic pain: Secondary | ICD-10-CM | POA: Diagnosis not present

## 2016-10-02 DIAGNOSIS — Z79891 Long term (current) use of opiate analgesic: Secondary | ICD-10-CM | POA: Diagnosis not present

## 2016-10-02 DIAGNOSIS — M25511 Pain in right shoulder: Secondary | ICD-10-CM | POA: Diagnosis not present

## 2016-10-02 DIAGNOSIS — K219 Gastro-esophageal reflux disease without esophagitis: Secondary | ICD-10-CM | POA: Diagnosis not present

## 2016-10-04 DIAGNOSIS — E559 Vitamin D deficiency, unspecified: Secondary | ICD-10-CM | POA: Diagnosis not present

## 2016-10-04 DIAGNOSIS — I129 Hypertensive chronic kidney disease with stage 1 through stage 4 chronic kidney disease, or unspecified chronic kidney disease: Secondary | ICD-10-CM | POA: Diagnosis not present

## 2016-10-04 DIAGNOSIS — R809 Proteinuria, unspecified: Secondary | ICD-10-CM | POA: Diagnosis not present

## 2016-10-04 DIAGNOSIS — D509 Iron deficiency anemia, unspecified: Secondary | ICD-10-CM | POA: Diagnosis not present

## 2016-10-04 DIAGNOSIS — N183 Chronic kidney disease, stage 3 (moderate): Secondary | ICD-10-CM | POA: Diagnosis not present

## 2016-10-04 DIAGNOSIS — Z79899 Other long term (current) drug therapy: Secondary | ICD-10-CM | POA: Diagnosis not present

## 2016-10-08 ENCOUNTER — Ambulatory Visit (INDEPENDENT_AMBULATORY_CARE_PROVIDER_SITE_OTHER): Payer: Medicare Other | Admitting: *Deleted

## 2016-10-08 DIAGNOSIS — E1129 Type 2 diabetes mellitus with other diabetic kidney complication: Secondary | ICD-10-CM | POA: Diagnosis not present

## 2016-10-08 DIAGNOSIS — I482 Chronic atrial fibrillation: Secondary | ICD-10-CM | POA: Diagnosis not present

## 2016-10-08 DIAGNOSIS — E559 Vitamin D deficiency, unspecified: Secondary | ICD-10-CM | POA: Diagnosis not present

## 2016-10-08 DIAGNOSIS — D649 Anemia, unspecified: Secondary | ICD-10-CM | POA: Diagnosis not present

## 2016-10-08 DIAGNOSIS — Z5181 Encounter for therapeutic drug level monitoring: Secondary | ICD-10-CM | POA: Diagnosis not present

## 2016-10-08 DIAGNOSIS — N183 Chronic kidney disease, stage 3 (moderate): Secondary | ICD-10-CM | POA: Diagnosis not present

## 2016-10-08 DIAGNOSIS — I10 Essential (primary) hypertension: Secondary | ICD-10-CM | POA: Diagnosis not present

## 2016-10-08 DIAGNOSIS — I4821 Permanent atrial fibrillation: Secondary | ICD-10-CM

## 2016-10-08 DIAGNOSIS — N250 Renal osteodystrophy: Secondary | ICD-10-CM | POA: Diagnosis not present

## 2016-10-08 DIAGNOSIS — E669 Obesity, unspecified: Secondary | ICD-10-CM | POA: Diagnosis not present

## 2016-10-08 LAB — POCT INR: INR: 2

## 2016-10-13 ENCOUNTER — Other Ambulatory Visit: Payer: Self-pay | Admitting: "Endocrinology

## 2016-10-18 NOTE — Progress Notes (Signed)
Patient needs OV with RMR only 01/2017

## 2016-10-21 NOTE — Progress Notes (Signed)
ON RECALL FOR RMR OV °

## 2016-10-27 ENCOUNTER — Other Ambulatory Visit: Payer: Self-pay | Admitting: Cardiology

## 2016-10-27 ENCOUNTER — Other Ambulatory Visit: Payer: Self-pay | Admitting: Gastroenterology

## 2016-11-01 DIAGNOSIS — E1122 Type 2 diabetes mellitus with diabetic chronic kidney disease: Secondary | ICD-10-CM | POA: Diagnosis not present

## 2016-11-05 ENCOUNTER — Ambulatory Visit (INDEPENDENT_AMBULATORY_CARE_PROVIDER_SITE_OTHER): Payer: Medicare Other | Admitting: *Deleted

## 2016-11-05 DIAGNOSIS — H3530 Unspecified macular degeneration: Secondary | ICD-10-CM | POA: Diagnosis not present

## 2016-11-05 DIAGNOSIS — I482 Chronic atrial fibrillation: Secondary | ICD-10-CM | POA: Diagnosis not present

## 2016-11-05 DIAGNOSIS — I10 Essential (primary) hypertension: Secondary | ICD-10-CM | POA: Diagnosis not present

## 2016-11-05 DIAGNOSIS — E875 Hyperkalemia: Secondary | ICD-10-CM | POA: Diagnosis not present

## 2016-11-05 DIAGNOSIS — Z6834 Body mass index (BMI) 34.0-34.9, adult: Secondary | ICD-10-CM | POA: Diagnosis not present

## 2016-11-05 DIAGNOSIS — E782 Mixed hyperlipidemia: Secondary | ICD-10-CM | POA: Diagnosis not present

## 2016-11-05 DIAGNOSIS — N183 Chronic kidney disease, stage 3 (moderate): Secondary | ICD-10-CM | POA: Diagnosis not present

## 2016-11-05 DIAGNOSIS — I4821 Permanent atrial fibrillation: Secondary | ICD-10-CM

## 2016-11-05 DIAGNOSIS — K219 Gastro-esophageal reflux disease without esophagitis: Secondary | ICD-10-CM | POA: Diagnosis not present

## 2016-11-05 DIAGNOSIS — E1122 Type 2 diabetes mellitus with diabetic chronic kidney disease: Secondary | ICD-10-CM | POA: Diagnosis not present

## 2016-11-05 DIAGNOSIS — Z5181 Encounter for therapeutic drug level monitoring: Secondary | ICD-10-CM | POA: Diagnosis not present

## 2016-11-05 DIAGNOSIS — E669 Obesity, unspecified: Secondary | ICD-10-CM | POA: Diagnosis not present

## 2016-11-05 LAB — POCT INR: INR: 2.1

## 2016-11-15 ENCOUNTER — Other Ambulatory Visit: Payer: Self-pay | Admitting: "Endocrinology

## 2016-11-15 DIAGNOSIS — N183 Chronic kidney disease, stage 3 (moderate): Secondary | ICD-10-CM | POA: Diagnosis not present

## 2016-11-15 DIAGNOSIS — E1122 Type 2 diabetes mellitus with diabetic chronic kidney disease: Secondary | ICD-10-CM | POA: Diagnosis not present

## 2016-11-15 LAB — COMPREHENSIVE METABOLIC PANEL
ALBUMIN: 3.8 g/dL (ref 3.6–5.1)
ALK PHOS: 74 U/L (ref 33–130)
ALT: 8 U/L (ref 6–29)
AST: 13 U/L (ref 10–35)
BILIRUBIN TOTAL: 0.9 mg/dL (ref 0.2–1.2)
BUN: 22 mg/dL (ref 7–25)
CALCIUM: 9.2 mg/dL (ref 8.6–10.4)
CO2: 26 mmol/L (ref 20–32)
Chloride: 101 mmol/L (ref 98–110)
Creat: 1.38 mg/dL — ABNORMAL HIGH (ref 0.60–0.88)
GLUCOSE: 231 mg/dL — AB (ref 65–99)
POTASSIUM: 4.7 mmol/L (ref 3.5–5.3)
Sodium: 136 mmol/L (ref 135–146)
Total Protein: 6.7 g/dL (ref 6.1–8.1)

## 2016-11-16 LAB — HEMOGLOBIN A1C
HEMOGLOBIN A1C: 8.3 % — AB (ref <5.7)
MEAN PLASMA GLUCOSE: 192 mg/dL

## 2016-11-20 ENCOUNTER — Encounter: Payer: Self-pay | Admitting: *Deleted

## 2016-11-21 ENCOUNTER — Ambulatory Visit (INDEPENDENT_AMBULATORY_CARE_PROVIDER_SITE_OTHER): Payer: Medicare Other | Admitting: Cardiology

## 2016-11-21 ENCOUNTER — Encounter: Payer: Self-pay | Admitting: Cardiology

## 2016-11-21 VITALS — BP 118/60 | HR 76 | Ht 63.0 in | Wt 211.4 lb

## 2016-11-21 DIAGNOSIS — I482 Chronic atrial fibrillation, unspecified: Secondary | ICD-10-CM

## 2016-11-21 DIAGNOSIS — I10 Essential (primary) hypertension: Secondary | ICD-10-CM | POA: Diagnosis not present

## 2016-11-21 DIAGNOSIS — E782 Mixed hyperlipidemia: Secondary | ICD-10-CM

## 2016-11-21 NOTE — Patient Instructions (Signed)

## 2016-11-21 NOTE — Progress Notes (Signed)
Cardiology Office Note  Date: 11/21/2016   ID: [name] 02/27/1935, MRN [id number]  PCP: [name], MD  Primary Cardiologist: [name], MD   Chief Complaint  Patient presents with  . Atrial Fibrillation    History of Present Illness: [name] is an 82 y.o. female last seen in February. She presents for a routine follow-up visit. Reports no palpitations or chest pain. She continues to follow with Dr. [name] for primary care.  She continues on Coumadin with follow-up in the anticoagulation clinic. Denies any significant bleeding problems. Last INR was 2.1.  I reviewed her medications. Cardiac regimen includes Coumadin, Zocor, Lopressor, HCTZ, clonidine, and Norvasc.  Past Medical History:  Diagnosis Date  . Anxiety   . Asthma   . Chronic back pain   . Colonoscopy causing post-procedural bleeding    Incomplete. by Dr. [name] 06/11/99 due to fixed non-compliant colon precluded exam to 40 cm, she had subsequent colectomy for diverticulitis since that time.   . Diarrhea   . Esophageal reflux   . Essential hypertension, benign   . Fibromyalgia   . Hiatal hernia    Recent EGD/TCS showed small hiatal hernia, normal apperaring tubular esophagus. s/p passage of a 54-french Maloney dilator, s/p biopsy esophageal mucosa, multiple fundal gland type gastric polyps. one large pedunculated polp with oozing, noted s/p clipping and snare polypectomy. Biopsies of the gastric mucosa taken given her symptoms in her elevated count. normal D1-D3, s/p biospy D2-D3.   [name] Hiatal hernia    all bx unremarkable. on TCS she had left-sided diverticula, evidence of prior segmental resection with anastomosis, multiple colonic polyps s/p multiple snare polypectomies, s/p segmental biopsy and stool sampling. she had multiple tubular adenomas. no microscopic/collagenous colitis. stool culture, c diff, O+P, lactoferrin were negative.   . Mixed hyperlipidemia   . Nephrolithiasis   . OSA  (obstructive sleep apnea)   . Permanent atrial fibrillation (HCC)    Previous failed DCCV  . Type 2 diabetes mellitus ([name])   . Ureteral stent retained    Not retained. ureteral stent removal gross hematuria resolved 10/11. coumadin restarted.   . Ventral hernia     Past Surgical History:  Procedure Laterality Date  . BREAST LUMPECTOMY     benign cyst  . CATARACT EXTRACTION W/PHACO Left 08/17/2012   Procedure: CATARACT EXTRACTION PHACO AND INTRAOCULAR LENS PLACEMENT (IOC);  Surgeon: [name], MD;  Location: [facility];  Service: Ophthalmology;  Laterality: Left;  CDE:18.73  . COLECTOMY     2005. Dr. Geoffry Paradise for diverticulitis  . COLONOSCOPY  02/22/09   normal/left-sided diverticula/multiple colonic polyp, adenomatous  . COLONOSCOPY  12/16/2011   colonic polyps-treated as described above. Status postprior sigmoid resection. adenomatous. next TCS 12/2014  . COLONOSCOPY N/A 12/28/2014   Status post prior segmental resection. Few residual colonic diverticula- status post segmental biopsy negative random colon biopsies  . ESOPHAGEAL DILATION N/A 12/28/2014   Procedure: ESOPHAGEAL DILATION;  Surgeon: [name], MD;  Location: [facility];  Service: Endoscopy;  Laterality: N/A;  . ESOPHAGOGASTRODUODENOSCOPY  02/22/09   normal s/p dilator;small HH/one large pedunculates polyp  s/p clipping, hyperplastic  . ESOPHAGOGASTRODUODENOSCOPY  12/16/2011   [name]: small hiatal hernia. Hyperplastic apperating polyps. Status post [name] dilation as described above.  . ESOPHAGOGASTRODUODENOSCOPY N/A 12/28/2014   RMR: Normal-appearing esophagus status post passage of a maloney dilator. Hiatal hernia. abnormal gastic mucosa of uncertain significance status post gastric biopsy with mild chronic gastritis, no H pylori.   [name]  TONSILLECTOMY    . TOTAL ABDOMINAL HYSTERECTOMY      Current Outpatient Prescriptions  Medication Sig Dispense Refill  . acetaminophen (TYLENOL) 325 MG tablet Take 650 mg by mouth every  6 (six) hours as needed for pain.     [name] ALPRAZolam (XANAX) 1 MG tablet Take 0.5 mg by mouth 2 (two) times daily as needed for anxiety.     [name] amLODipine (NORVASC) 10 MG tablet Take 10 mg by mouth daily.    . Cholecalciferol (VITAMIN D3) 1000 units CAPS Take 1 capsule by mouth daily.    . cloNIDine (CATAPRES - DOSED IN MG/24 HR) 0.1 mg/24hr patch APPLY 1 PATCH ONCE A WEEK AS DIRECTED 4 patch 6  . dicyclomine (BENTYL) 10 MG capsule Take two in am and one at bedtime for diarrhea. Hold for constipation. 90 capsule 5  . HUMALOG KWIKPEN 100 UNIT/ML KiwkPen INJECT 5-11 UNITS INTO THE SKIN THREE TIMES DAILY 15 mL 2  . hydrochlorothiazide 50 MG tablet Take 25 mg by mouth daily.     [name] LANTUS SOLOSTAR 100 UNIT/ML Solostar Pen INJECT 26 UNITS INTO THE SKIN DAILY AT 10 PM. 15 mL 2  . Melatonin 1 MG TABS Take 1 mg by mouth as needed.    . metoprolol tartrate (LOPRESSOR) 25 MG tablet TAKE 1 TABLET BY MOUTH TWICE DAILY 60 tablet 6  . nitroGLYCERIN (NITROSTAT) 0.4 MG SL tablet Place 1 tablet (0.4 mg total) under the tongue every 5 (five) minutes x 3 doses as needed for chest pain. If no relief after the 3 rd dose, proceed to the ED for an evaluation 90 tablet 3  . nystatin cream (MYCOSTATIN) Apply 1 application topically 2 (two) times daily. Continue for two days after resolution of symptoms. 60 g 0  . ondansetron (ZOFRAN-ODT) 4 MG disintegrating tablet Take 4 mg by mouth every 8 (eight) hours as needed. Nausea and Vomiting    . Oxycodone HCl 10 MG TABS Take 10 mg by mouth as needed.  0  . pantoprazole (PROTONIX) 40 MG tablet TAKE 1 TABLET BY MOUTH TWICE DAILY 60 tablet 5  . simvastatin (ZOCOR) 20 MG tablet Take 1 tablet by mouth daily.    . tizanidine (ZANAFLEX) 2 MG capsule Take 2 mg by mouth 3 (three) times daily as needed for muscle spasms.     [name] VICTOZA 18 MG/3ML SOPN INJECT 0.3 MLS (1.8 MG TOTAL) INTO THE SKIN DAILY 9 mL 2  . warfarin (COUMADIN) 5 MG tablet TAKE 1/2 TABLET BY MOUTH EVERY DAY EXCEPT TAKE 1  TABLET ON MONDAY, WEDNESDAY, AND FRIDAY OR AS DIRECTED BY COUMADIN CLINIC 30 tablet 6   No current facility-administered medications for this visit.    Allergies:  Adhesive [tape]; Demerol [meperidine]; Iohexol; Meperidine hcl; Shellfish allergy; and Penicillins   Social History: The patient  reports that she has never smoked. She has never used smokeless tobacco. She reports that she does not drink alcohol or use drugs.   ROS:  Please see the history of present illness. Otherwise, complete review of systems is positive for chronic arthritic symptoms.  All other systems are reviewed and negative.   Physical Exam: VS:  BP 118/60   Pulse 76   Ht _0  (1.6 m)   Wt 211 lb 6.4 oz (95.9 kg)   SpO2 96%   BMI 37.45 kg/m , BMI Body mass index is 37.45 kg/m.  Wt Readings from Last 3 Encounters:  11/21/16 211 lb 6.4 oz (95.9 kg)  08/21/16 210  lb (95.3 kg)  08/15/16 209 lb 3.2 oz (94.9 kg)    Overweight woman in no acute distress.  HEENT: Conjunctiva and lids normal, oropharynx clear.  Neck: Supple, no elevated JVP or carotid bruits, no thyromegaly.  Lungs: Clear to auscultation, nonlabored breathing at rest.  Cardiac: Irregularly irregular, no S3 or significant systolic murmur.  Abdomen: Soft, nontender,bowel sounds present.  Extremities: Trace edema, distal pulses 2+.   ECG: I personally reviewed the tracing from 05/31/2016 which showed rate-controlled atrial fibrillation with low voltage.  Recent Labwork: 11/15/2016: ALT 8; AST 13; BUN 22; Creat 1.38; Potassium 4.7; Sodium 136     Component Value Date/Time   CHOL 154 05/06/2016   TRIG 210 (A) 05/06/2016   HDL 31 (A) 05/06/2016   CHOLHDL 4.6 07/11/2009 2342   VLDL 35 07/11/2009 2342   LDLCALC 93 05/06/2016    Other Studies Reviewed Today:  Echocardiogram 11/16/2009 [facility]): Mild to moderate LVH with LVEF 55-60%, mild to moderate left atrial enlargement, MAC with mild mitral regurgitation, mildly thickened aortic  leaflets, trace tricuspid regurgitation, trivial pericardial effusion.  Assessment and Plan:  1. Chronic atrial fibrillation, heart rate adequately controlled on Lopressor. She continues on Coumadin for stroke prophylaxis.  2. Hyperlipidemia on Zocor. LDL was 93 and January. Keep follow-up with Dr. [name].  3. Essential hypertension, blood pressure well controlled today. No changes made to current regimen. Keep follow-up with Dr. [name].  Current medicines were reviewed with the patient today.  Disposition: Follow-up in 6 months.  Signed, [name], MD, [facility] 11/21/2016 11:21 AM    [place] [facility] [place], [place], [place] [address] Phone: [phone]; Fax: [phone]

## 2016-11-22 ENCOUNTER — Ambulatory Visit (INDEPENDENT_AMBULATORY_CARE_PROVIDER_SITE_OTHER): Payer: Medicare Other | Admitting: "Endocrinology

## 2016-11-22 ENCOUNTER — Encounter: Payer: Self-pay | Admitting: "Endocrinology

## 2016-11-22 VITALS — BP 139/89 | HR 90 | Ht 63.0 in | Wt 209.0 lb

## 2016-11-22 DIAGNOSIS — N183 Chronic kidney disease, stage 3 unspecified: Secondary | ICD-10-CM

## 2016-11-22 DIAGNOSIS — E782 Mixed hyperlipidemia: Secondary | ICD-10-CM

## 2016-11-22 DIAGNOSIS — I10 Essential (primary) hypertension: Secondary | ICD-10-CM | POA: Diagnosis not present

## 2016-11-22 DIAGNOSIS — E1122 Type 2 diabetes mellitus with diabetic chronic kidney disease: Secondary | ICD-10-CM

## 2016-11-22 MED ORDER — INSULIN GLARGINE 100 UNIT/ML SOLOSTAR PEN: PEN_INJECTOR | SUBCUTANEOUS | 2 refills | Status: DC

## 2016-11-22 NOTE — Progress Notes (Signed)
Subjective:    Patient ID: [name], female    DOB: Sep 16, 1934, PCP [name], MD   Past Medical History:  Diagnosis Date  . Anxiety   . Asthma   . Chronic back pain   . Colonoscopy causing post-procedural bleeding    Incomplete. by Dr. [name] 06/11/99 due to fixed non-compliant colon precluded exam to 40 cm, she had subsequent colectomy for diverticulitis since that time.   . Diarrhea   . Esophageal reflux   . Essential hypertension, benign   . Fibromyalgia   . Hiatal hernia    Recent EGD/TCS showed small hiatal hernia, normal apperaring tubular esophagus. s/p passage of a 54-french Maloney dilator, s/p biopsy esophageal mucosa, multiple fundal gland type gastric polyps. one large pedunculated polp with oozing, noted s/p clipping and snare polypectomy. Biopsies of the gastric mucosa taken given her symptoms in her elevated count. normal D1-D3, s/p biospy D2-D3.   [name] Hiatal hernia    all bx unremarkable. on TCS she had left-sided diverticula, evidence of prior segmental resection with anastomosis, multiple colonic polyps s/p multiple snare polypectomies, s/p segmental biopsy and stool sampling. she had multiple tubular adenomas. no microscopic/collagenous colitis. stool culture, c diff, O+P, lactoferrin were negative.   . Mixed hyperlipidemia   . Nephrolithiasis   . OSA (obstructive sleep apnea)   . Permanent atrial fibrillation (HCC)    Previous failed DCCV  . Type 2 diabetes mellitus ([place])   . Ureteral stent retained    Not retained. ureteral stent removal gross hematuria resolved 10/11. coumadin restarted.   . Ventral hernia    Past Surgical History:  Procedure Laterality Date  . BREAST LUMPECTOMY     benign cyst  . CATARACT EXTRACTION W/PHACO Left 08/17/2012   Procedure: CATARACT EXTRACTION PHACO AND INTRAOCULAR LENS PLACEMENT (IOC);  Surgeon: [name], MD;  Location: [facility];  Service: Ophthalmology;  Laterality: Left;  CDE:18.73  . COLECTOMY     2005. Dr.  Geoffry Paradise for diverticulitis  . COLONOSCOPY  02/22/09   normal/left-sided diverticula/multiple colonic polyp, adenomatous  . COLONOSCOPY  12/16/2011   colonic polyps-treated as described above. Status postprior sigmoid resection. adenomatous. next TCS 12/2014  . COLONOSCOPY N/A 12/28/2014   Status post prior segmental resection. Few residual colonic diverticula- status post segmental biopsy negative random colon biopsies  . ESOPHAGEAL DILATION N/A 12/28/2014   Procedure: ESOPHAGEAL DILATION;  Surgeon: [name], MD;  Location: [facility];  Service: Endoscopy;  Laterality: N/A;  . ESOPHAGOGASTRODUODENOSCOPY  02/22/09   normal s/p dilator;small HH/one large pedunculates polyp  s/p clipping, hyperplastic  . ESOPHAGOGASTRODUODENOSCOPY  12/16/2011   [name]: small hiatal hernia. Hyperplastic apperating polyps. Status post [name] dilation as described above.  . ESOPHAGOGASTRODUODENOSCOPY N/A 12/28/2014   RMR: Normal-appearing esophagus status post passage of a maloney dilator. Hiatal hernia. abnormal gastic mucosa of uncertain significance status post gastric biopsy with mild chronic gastritis, no H pylori.   . TONSILLECTOMY    . TOTAL ABDOMINAL HYSTERECTOMY     Social History   Social History  . Marital status: Widowed    Spouse name: N/A  . Number of children: N/A  . Years of education: N/A   Social History Main Topics  . Smoking status: Never Smoker  . Smokeless tobacco: Never Used     Comment: tobacco use - no  . Alcohol use No  . Drug use: No  . Sexual activity: Not Asked   Other Topics Concern  . None   Social  History Narrative  . None   Outpatient Encounter Prescriptions as of 11/22/2016  Medication Sig  . acetaminophen (TYLENOL) 325 MG tablet Take 650 mg by mouth every 6 (six) hours as needed for pain.   [name] ALPRAZolam (XANAX) 1 MG tablet Take 0.5 mg by mouth 2 (two) times daily as needed for anxiety.   [name] amLODipine (NORVASC) 10 MG tablet Take 10 mg by mouth daily.  .  Cholecalciferol (VITAMIN D3) 1000 units CAPS Take 1 capsule by mouth daily.  . cloNIDine (CATAPRES - DOSED IN MG/24 HR) 0.1 mg/24hr patch APPLY 1 PATCH ONCE A WEEK AS DIRECTED  . dicyclomine (BENTYL) 10 MG capsule Take two in am and one at bedtime for diarrhea. Hold for constipation.  [name] HUMALOG KWIKPEN 100 UNIT/ML KiwkPen INJECT 5-11 UNITS INTO THE SKIN THREE TIMES DAILY  . hydrochlorothiazide 50 MG tablet Take 25 mg by mouth daily.   . Insulin Glargine (LANTUS SOLOSTAR) 100 UNIT/ML Solostar Pen INJECT 30 UNITS INTO THE SKIN DAILY AT 10 PM.  . Melatonin 1 MG TABS Take 1 mg by mouth as needed.  . metoprolol tartrate (LOPRESSOR) 25 MG tablet TAKE 1 TABLET BY MOUTH TWICE DAILY  . nitroGLYCERIN (NITROSTAT) 0.4 MG SL tablet Place 1 tablet (0.4 mg total) under the tongue every 5 (five) minutes x 3 doses as needed for chest pain. If no relief after the 3 rd dose, proceed to the ED for an evaluation  . nystatin cream (MYCOSTATIN) Apply 1 application topically 2 (two) times daily. Continue for two days after resolution of symptoms.  . ondansetron (ZOFRAN-ODT) 4 MG disintegrating tablet Take 4 mg by mouth every 8 (eight) hours as needed. Nausea and Vomiting  . Oxycodone HCl 10 MG TABS Take 10 mg by mouth as needed.  . pantoprazole (PROTONIX) 40 MG tablet TAKE 1 TABLET BY MOUTH TWICE DAILY  . simvastatin (ZOCOR) 20 MG tablet Take 1 tablet by mouth daily.  . tizanidine (ZANAFLEX) 2 MG capsule Take 2 mg by mouth 3 (three) times daily as needed for muscle spasms.   [name] VICTOZA 18 MG/3ML SOPN INJECT 0.3 MLS (1.8 MG TOTAL) INTO THE SKIN DAILY  . warfarin (COUMADIN) 5 MG tablet TAKE 1/2 TABLET BY MOUTH EVERY DAY EXCEPT TAKE 1 TABLET ON MONDAY, WEDNESDAY, AND FRIDAY OR AS DIRECTED BY COUMADIN CLINIC  . [DISCONTINUED] LANTUS SOLOSTAR 100 UNIT/ML Solostar Pen INJECT 26 UNITS INTO THE SKIN DAILY AT 10 PM.   No facility-administered encounter medications on file as of 11/22/2016.    ALLERGIES: Allergies  Allergen  Reactions  . Adhesive [Tape] Itching    Pulls skin off.   . Demerol [Meperidine]     Causes Vomiting  . Iohexol Nausea And Vomiting  . Meperidine Hcl Nausea And Vomiting  . Shellfish Allergy Diarrhea and Nausea And Vomiting  . Penicillins Nausea And Vomiting and Rash   VACCINATION STATUS:  There is no immunization history on file for this patient.  Diabetes  She presents for her follow-up diabetic visit. She has type 2 diabetes mellitus. Onset time: She was diagnosed at approximate age of 72 years. Her disease course has been stable. There are no hypoglycemic associated symptoms. Pertinent negatives for hypoglycemia include no confusion, headaches, pallor or seizures. (She has a few random mild hypoglycemia episodes.) There are no diabetic associated symptoms. Pertinent negatives for diabetes include no chest pain, no polydipsia, no polyphagia and no polyuria. There are no hypoglycemic complications. Symptoms are stable. Diabetic complications include nephropathy. Risk factors for coronary artery  disease include diabetes mellitus, dyslipidemia, hypertension, obesity and sedentary lifestyle. Current diabetic treatment includes intensive insulin program. She is compliant with treatment most of the time. Her weight is stable. She is following a generally unhealthy diet. She has had a previous visit with a dietitian. Her home blood glucose trend is decreasing steadily. Her breakfast blood glucose range is generally 140-180 mg/dl. Her lunch blood glucose range is generally 140-180 mg/dl. Her dinner blood glucose range is generally 140-180 mg/dl. Her bedtime blood glucose range is generally 140-180 mg/dl. Her overall blood glucose range is 140-180 mg/dl. Eye exam is current.  Hyperlipidemia  This is a chronic problem. The current episode started more than 1 year ago. Exacerbating diseases include diabetes. Pertinent negatives include no chest pain, myalgias or shortness of breath. Current  antihyperlipidemic treatment includes statins. Risk factors for coronary artery disease include diabetes mellitus, dyslipidemia, hypertension, obesity and a sedentary lifestyle.  Hypertension  This is a chronic problem. The current episode started more than 1 year ago. Pertinent negatives include no chest pain, headaches, palpitations or shortness of breath. Risk factors for coronary artery disease include diabetes mellitus, dyslipidemia, obesity and sedentary lifestyle.     Review of Systems  Constitutional: Negative for chills, fever and unexpected weight change.  HENT: Negative for trouble swallowing and voice change.   Eyes: Negative for visual disturbance.  Respiratory: Negative for cough, shortness of breath and wheezing.   Cardiovascular: Negative for chest pain, palpitations and leg swelling.  Gastrointestinal: Negative for diarrhea, nausea and vomiting.  Endocrine: Negative for cold intolerance, heat intolerance, polydipsia, polyphagia and polyuria.  Musculoskeletal: Negative for arthralgias and myalgias.  Skin: Negative for color change, pallor, rash and wound.  Neurological: Negative for seizures and headaches.  Psychiatric/Behavioral: Negative for confusion and suicidal ideas.    Objective:    BP 139/89   Pulse 90   Ht 5\' 3"  (1.6 m)   Wt 209 lb (94.8 kg)   BMI 37.02 kg/m   Wt Readings from Last 3 Encounters:  11/22/16 209 lb (94.8 kg)  11/21/16 211 lb 6.4 oz (95.9 kg)  08/21/16 210 lb (95.3 kg)    Physical Exam  Constitutional: She is oriented to person, place, and time. She appears well-developed.  HENT:  Head: Normocephalic and atraumatic.  Eyes: EOM are normal.  Neck: Normal range of motion. Neck supple. No tracheal deviation present. No thyromegaly present.  Cardiovascular: Normal rate and regular rhythm.   Pulmonary/Chest: Effort normal and breath sounds normal.  Abdominal: Soft. Bowel sounds are normal. There is no tenderness. There is no guarding.   Musculoskeletal: Normal range of motion. She exhibits no edema.  Neurological: She is alert and oriented to person, place, and time. She has normal reflexes. No cranial nerve deficit. Coordination normal.  Skin: Skin is warm and dry. No rash noted. No erythema. No pallor.  Psychiatric: She has a normal mood and affect. Judgment normal.    CMP     Component Value Date/Time   NA 136 11/15/2016 1005   K 4.7 11/15/2016 1005   CL 101 11/15/2016 1005   CO2 26 11/15/2016 1005   GLUCOSE 231 (H) 11/15/2016 1005   BUN 22 11/15/2016 1005   CREATININE 1.38 (H) 11/15/2016 1005   CALCIUM 9.2 11/15/2016 1005   PROT 6.7 11/15/2016 1005   ALBUMIN 3.8 11/15/2016 1005   AST 13 11/15/2016 1005   ALT 8 11/15/2016 1005   ALKPHOS 74 11/15/2016 1005   BILITOT 0.9 11/15/2016 1005   GFRNONAA 32 (  L) 01/30/2016 1015   GFRAA 37 (L) 01/30/2016 1015     Diabetic Labs (most recent): Lab Results  Component Value Date   HGBA1C 8.3 (H) 11/15/2016   HGBA1C 8.3 (H) 08/15/2016   HGBA1C 8.1 05/06/2016    Lipid Panel     Component Value Date/Time   CHOL 154 05/06/2016   TRIG 210 (A) 05/06/2016   HDL 31 (A) 05/06/2016   CHOLHDL 4.6 07/11/2009 2342   VLDL 35 07/11/2009 2342   LDLCALC 93 05/06/2016      Assessment & Plan:   1. Diabetes mellitus with stage 3 chronic kidney disease ([place])  -Patient remains at a high risk for more acute and chronic complications of diabetes which include CAD, CVA, CKD, retinopathy, and neuropathy. These are all discussed in detail with the patient.  Patient came with  above target glucose profile and her A1c has Stayed at 8.3% .  - Glucose logs and insulin administration records pertaining to this visit,  to be scanned into patient's records.  Recent labs reviewed.   - I have re-counseled the patient on diet management and weight loss  by adopting a carbohydrate restricted / protein [name]  Diet.  Suggestion is made for her to avoid simple carbohydrates  from her diet  including Cakes, Sweet Desserts, Ice Cream, Soda (diet and regular), Sweet Tea, Candies, Chips, Cookies, Store Bought Juices, Alcohol in Excess of  1-2 drinks a day, Artificial Sweeteners, and "Sugar-free" Products. This will help patient to have stable blood glucose profile and potentially avoid unintended weight gain.   - Patient is advised to stick to a routine mealtimes to eat 3 meals  a day and avoid unnecessary snacks ( to snack only to correct hypoglycemia).   - I have approached patient with the following individualized plan to manage diabetes and patient agrees.  - I advised her to increase Lantus to 30 units daily at bedtime, continue Humalog 8 units 3 times a day before meals , plus correction.  - continue Victoza 1.8mg  daily . -Side effects of Victoza is discussed with the patient.  -Patient is not a candidate for metformin and SGLT2 inhibitors due to CKD.  - Patient specific target  for A1c; LDL, HDL, Triglycerides, and  Waist Circumference were discussed in detail.  2) BP/HTN: Controlled. Continue current medications.  3) Lipids/HPL:  she is on Simvastatin 20 mg po qhs. 4)  Weight/Diet: CDE consult in progress, exercise, and carbohydrates information provided.  5) Chronic Care/Health Maintenance:  -Patient is on  Statin medications and encouraged to continue to follow up with Ophthalmology, Podiatrist at least yearly or according to recommendations, and advised to  stay away from smoking. I have recommended yearly flu vaccine and pneumonia vaccination at least every 5 years; moderate intensity exercise for up to 150 minutes weekly; and  sleep for at least 7 hours a day.  - Time spent with the patient: 25 min, of which >50% was spent in reviewing her sugar logs , discussing her hypo- and hyper-glycemic episodes, reviewing  previous labs and insulin doses and developing a plan to avoid hypo- and hyper-glycemia.    - I advised patient to maintain close follow up with [name], MD for primary care needs.  Patient is asked to bring meter and  blood glucose logs during her next visit.   Follow up plan: -Return in about 3 months (around 02/22/2017) for follow up with pre-visit labs, meter, and logs.  Glade Lloyd, MD Phone: [phone]  Fax: [phone]   11/22/2016, 11:28 AM

## 2016-11-22 NOTE — Patient Instructions (Signed)

## 2016-12-01 ENCOUNTER — Other Ambulatory Visit: Payer: Self-pay | Admitting: Cardiology

## 2016-12-02 DIAGNOSIS — G43909 Migraine, unspecified, not intractable, without status migrainosus: Secondary | ICD-10-CM | POA: Diagnosis not present

## 2016-12-02 DIAGNOSIS — E119 Type 2 diabetes mellitus without complications: Secondary | ICD-10-CM | POA: Diagnosis not present

## 2016-12-02 DIAGNOSIS — G4701 Insomnia due to medical condition: Secondary | ICD-10-CM | POA: Diagnosis not present

## 2016-12-02 DIAGNOSIS — M25511 Pain in right shoulder: Secondary | ICD-10-CM | POA: Diagnosis not present

## 2016-12-02 DIAGNOSIS — M25552 Pain in left hip: Secondary | ICD-10-CM | POA: Diagnosis not present

## 2016-12-02 DIAGNOSIS — I10 Essential (primary) hypertension: Secondary | ICD-10-CM | POA: Diagnosis not present

## 2016-12-02 DIAGNOSIS — M797 Fibromyalgia: Secondary | ICD-10-CM | POA: Diagnosis not present

## 2016-12-02 DIAGNOSIS — K219 Gastro-esophageal reflux disease without esophagitis: Secondary | ICD-10-CM | POA: Diagnosis not present

## 2016-12-02 DIAGNOSIS — Z79891 Long term (current) use of opiate analgesic: Secondary | ICD-10-CM | POA: Diagnosis not present

## 2016-12-02 DIAGNOSIS — M545 Low back pain: Secondary | ICD-10-CM | POA: Diagnosis not present

## 2016-12-02 DIAGNOSIS — G8929 Other chronic pain: Secondary | ICD-10-CM | POA: Diagnosis not present

## 2016-12-02 DIAGNOSIS — E785 Hyperlipidemia, unspecified: Secondary | ICD-10-CM | POA: Diagnosis not present

## 2016-12-19 ENCOUNTER — Ambulatory Visit (INDEPENDENT_AMBULATORY_CARE_PROVIDER_SITE_OTHER): Payer: Medicare Other | Admitting: *Deleted

## 2016-12-19 ENCOUNTER — Encounter: Payer: Self-pay | Admitting: Internal Medicine

## 2016-12-19 DIAGNOSIS — Z5181 Encounter for therapeutic drug level monitoring: Secondary | ICD-10-CM

## 2016-12-19 DIAGNOSIS — I4821 Permanent atrial fibrillation: Secondary | ICD-10-CM

## 2016-12-19 DIAGNOSIS — I482 Chronic atrial fibrillation: Secondary | ICD-10-CM

## 2016-12-19 LAB — POCT INR: INR: 1.4

## 2016-12-25 ENCOUNTER — Other Ambulatory Visit: Payer: Self-pay | Admitting: "Endocrinology

## 2016-12-26 ENCOUNTER — Ambulatory Visit (INDEPENDENT_AMBULATORY_CARE_PROVIDER_SITE_OTHER): Payer: Medicare Other | Admitting: *Deleted

## 2016-12-26 DIAGNOSIS — I482 Chronic atrial fibrillation: Secondary | ICD-10-CM | POA: Diagnosis not present

## 2016-12-26 DIAGNOSIS — I4821 Permanent atrial fibrillation: Secondary | ICD-10-CM

## 2016-12-26 DIAGNOSIS — Z5181 Encounter for therapeutic drug level monitoring: Secondary | ICD-10-CM

## 2016-12-26 LAB — POCT INR: INR: 1.3

## 2016-12-31 ENCOUNTER — Ambulatory Visit (INDEPENDENT_AMBULATORY_CARE_PROVIDER_SITE_OTHER): Payer: Medicare Other | Admitting: *Deleted

## 2016-12-31 DIAGNOSIS — Z5181 Encounter for therapeutic drug level monitoring: Secondary | ICD-10-CM | POA: Diagnosis not present

## 2016-12-31 DIAGNOSIS — I4821 Permanent atrial fibrillation: Secondary | ICD-10-CM

## 2016-12-31 DIAGNOSIS — I482 Chronic atrial fibrillation: Secondary | ICD-10-CM

## 2016-12-31 LAB — POCT INR: INR: 3.8

## 2017-01-21 ENCOUNTER — Ambulatory Visit (INDEPENDENT_AMBULATORY_CARE_PROVIDER_SITE_OTHER): Payer: Medicare Other | Admitting: *Deleted

## 2017-01-21 DIAGNOSIS — I482 Chronic atrial fibrillation: Secondary | ICD-10-CM | POA: Diagnosis not present

## 2017-01-21 DIAGNOSIS — I4821 Permanent atrial fibrillation: Secondary | ICD-10-CM

## 2017-01-21 DIAGNOSIS — Z5181 Encounter for therapeutic drug level monitoring: Secondary | ICD-10-CM | POA: Diagnosis not present

## 2017-01-21 LAB — POCT INR: INR: 2.8

## 2017-01-22 ENCOUNTER — Other Ambulatory Visit: Payer: Self-pay | Admitting: Cardiology

## 2017-01-27 ENCOUNTER — Ambulatory Visit: Payer: Self-pay | Admitting: Family Medicine

## 2017-01-28 ENCOUNTER — Ambulatory Visit (INDEPENDENT_AMBULATORY_CARE_PROVIDER_SITE_OTHER): Payer: Medicare Other | Admitting: Family Medicine

## 2017-01-28 ENCOUNTER — Encounter: Payer: Self-pay | Admitting: Family Medicine

## 2017-01-28 VITALS — BP 166/90 | HR 94 | Temp 98.3°F | Resp 16 | Ht 63.0 in | Wt 210.0 lb

## 2017-01-28 DIAGNOSIS — E1122 Type 2 diabetes mellitus with diabetic chronic kidney disease: Secondary | ICD-10-CM

## 2017-01-28 DIAGNOSIS — N183 Chronic kidney disease, stage 3 unspecified: Secondary | ICD-10-CM

## 2017-01-28 DIAGNOSIS — I10 Essential (primary) hypertension: Secondary | ICD-10-CM | POA: Diagnosis not present

## 2017-01-28 DIAGNOSIS — T148XXA Other injury of unspecified body region, initial encounter: Secondary | ICD-10-CM

## 2017-01-28 DIAGNOSIS — R197 Diarrhea, unspecified: Secondary | ICD-10-CM

## 2017-01-28 DIAGNOSIS — I4821 Permanent atrial fibrillation: Secondary | ICD-10-CM

## 2017-01-28 DIAGNOSIS — Z23 Encounter for immunization: Secondary | ICD-10-CM

## 2017-01-28 DIAGNOSIS — I482 Chronic atrial fibrillation: Secondary | ICD-10-CM | POA: Diagnosis not present

## 2017-01-28 MED ORDER — MUPIROCIN CALCIUM 2 % EX CREA: 1.0000 "application " | TOPICAL_CREAM | Freq: Two times a day (BID) | CUTANEOUS | 0 refills | Status: DC
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
## 2017-01-29 DIAGNOSIS — G8929 Other chronic pain: Secondary | ICD-10-CM | POA: Diagnosis not present

## 2017-01-29 DIAGNOSIS — M545 Low back pain: Secondary | ICD-10-CM | POA: Diagnosis not present

## 2017-01-29 DIAGNOSIS — Z79891 Long term (current) use of opiate analgesic: Secondary | ICD-10-CM | POA: Diagnosis not present

## 2017-01-29 DIAGNOSIS — M25511 Pain in right shoulder: Secondary | ICD-10-CM | POA: Diagnosis not present

## 2017-02-07 DIAGNOSIS — D509 Iron deficiency anemia, unspecified: Secondary | ICD-10-CM | POA: Diagnosis not present

## 2017-02-07 DIAGNOSIS — I129 Hypertensive chronic kidney disease with stage 1 through stage 4 chronic kidney disease, or unspecified chronic kidney disease: Secondary | ICD-10-CM | POA: Diagnosis not present

## 2017-02-07 DIAGNOSIS — N183 Chronic kidney disease, stage 3 (moderate): Secondary | ICD-10-CM | POA: Diagnosis not present

## 2017-02-07 DIAGNOSIS — Z79899 Other long term (current) drug therapy: Secondary | ICD-10-CM | POA: Diagnosis not present

## 2017-02-07 DIAGNOSIS — E559 Vitamin D deficiency, unspecified: Secondary | ICD-10-CM | POA: Diagnosis not present

## 2017-02-07 DIAGNOSIS — R809 Proteinuria, unspecified: Secondary | ICD-10-CM | POA: Diagnosis not present

## 2017-02-18 ENCOUNTER — Ambulatory Visit (INDEPENDENT_AMBULATORY_CARE_PROVIDER_SITE_OTHER): Payer: Medicare Other | Admitting: *Deleted

## 2017-02-18 DIAGNOSIS — Z5181 Encounter for therapeutic drug level monitoring: Secondary | ICD-10-CM

## 2017-02-18 DIAGNOSIS — I482 Chronic atrial fibrillation: Secondary | ICD-10-CM | POA: Diagnosis not present

## 2017-02-18 DIAGNOSIS — I4821 Permanent atrial fibrillation: Secondary | ICD-10-CM

## 2017-02-18 LAB — POCT INR: INR: 2.3

## 2017-02-25 ENCOUNTER — Other Ambulatory Visit: Payer: Self-pay

## 2017-02-25 ENCOUNTER — Ambulatory Visit (INDEPENDENT_AMBULATORY_CARE_PROVIDER_SITE_OTHER): Payer: Medicare Other | Admitting: Internal Medicine

## 2017-02-25 ENCOUNTER — Encounter: Payer: Self-pay | Admitting: Internal Medicine

## 2017-02-25 VITALS — BP 134/80 | HR 79 | Temp 97.0°F | Ht 63.0 in | Wt 205.8 lb

## 2017-02-25 DIAGNOSIS — E1122 Type 2 diabetes mellitus with diabetic chronic kidney disease: Secondary | ICD-10-CM | POA: Diagnosis not present

## 2017-02-25 DIAGNOSIS — R197 Diarrhea, unspecified: Secondary | ICD-10-CM | POA: Diagnosis not present

## 2017-02-25 DIAGNOSIS — N183 Chronic kidney disease, stage 3 (moderate): Secondary | ICD-10-CM | POA: Diagnosis not present

## 2017-02-25 DIAGNOSIS — K589 Irritable bowel syndrome without diarrhea: Secondary | ICD-10-CM

## 2017-02-25 MED ORDER — CILIDINIUM-CHLORDIAZEPOXIDE 2.5-5 MG PO CAPS: 60.0000 | ORAL_CAPSULE | Freq: Three times a day (TID) | ORAL | 11 refills | Status: DC

## 2017-02-25 NOTE — Patient Instructions (Signed)
Stop Bentyl;  Trial of librax - one capsule before meals and at bedtime as needed for diarrhea (disp #60 with 11 refills)  Stool sample for fecal elastase and Cdiff  Office visit in 4 months  Further recommendations to follow
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
## 2017-02-26 ENCOUNTER — Telehealth: Payer: Self-pay | Admitting: Internal Medicine

## 2017-02-26 LAB — RENAL FUNCTION PANEL
ALBUMIN MSPROF: 3.8 g/dL (ref 3.6–5.1)
BUN/Creatinine Ratio: 11 (calc) (ref 6–22)
BUN: 16 mg/dL (ref 7–25)
CALCIUM: 8.8 mg/dL (ref 8.6–10.4)
CO2: 31 mmol/L (ref 20–32)
Chloride: 102 mmol/L (ref 98–110)
Creat: 1.41 mg/dL — ABNORMAL HIGH (ref 0.60–0.88)
GLUCOSE: 163 mg/dL — AB (ref 65–139)
PHOSPHORUS: 3.5 mg/dL (ref 2.1–4.3)
Potassium: 4.1 mmol/L (ref 3.5–5.3)
SODIUM: 139 mmol/L (ref 135–146)

## 2017-02-26 LAB — HEMOGLOBIN A1C
Hgb A1c MFr Bld: 8.3 % of total Hgb — ABNORMAL HIGH (ref <5.7)
MEAN PLASMA GLUCOSE: 192 (calc)
eAG (mmol/L): 10.6 (calc)

## 2017-02-26 NOTE — Telephone Encounter (Signed)
Pt was seen yesterday and called back today to let us know the prescription (doesn't know the name of it) was too expensive because her insurance wouldn't cover it. She asked if something else could be called into [facility]. [phone]

## 2017-02-26 NOTE — Telephone Encounter (Signed)
Librax isn't covered under pts insurance. Pt would like a another med called into [facility].

## 2017-03-04 ENCOUNTER — Other Ambulatory Visit (HOSPITAL_COMMUNITY)
Admission: RE | Admit: 2017-03-04 | Discharge: 2017-03-04 | Disposition: A | Discharge status: Home / Self Care | Payer: Medicare Other | Source: Ambulatory Visit | Attending: Internal Medicine | Admitting: Internal Medicine

## 2017-03-04 ENCOUNTER — Encounter: Payer: Self-pay | Admitting: "Endocrinology

## 2017-03-04 ENCOUNTER — Ambulatory Visit (INDEPENDENT_AMBULATORY_CARE_PROVIDER_SITE_OTHER): Payer: Medicare Other | Admitting: "Endocrinology

## 2017-03-04 VITALS — BP 147/83 | HR 60 | Ht 63.0 in | Wt 208.0 lb

## 2017-03-04 DIAGNOSIS — N183 Chronic kidney disease, stage 3 (moderate): Secondary | ICD-10-CM

## 2017-03-04 DIAGNOSIS — I10 Essential (primary) hypertension: Secondary | ICD-10-CM | POA: Diagnosis not present

## 2017-03-04 DIAGNOSIS — R197 Diarrhea, unspecified: Secondary | ICD-10-CM | POA: Insufficient documentation

## 2017-03-04 DIAGNOSIS — E782 Mixed hyperlipidemia: Secondary | ICD-10-CM | POA: Diagnosis not present

## 2017-03-04 DIAGNOSIS — E1122 Type 2 diabetes mellitus with diabetic chronic kidney disease: Secondary | ICD-10-CM | POA: Diagnosis not present

## 2017-03-04 MED ORDER — FREESTYLE LIBRE READER DEVI: 1.0000 | Freq: Once | 0 refills | Status: AC

## 2017-03-04 MED ORDER — FREESTYLE LIBRE SENSOR SYSTEM MISC: 2 refills | Status: DC

## 2017-03-04 NOTE — Patient Instructions (Signed)
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
## 2017-03-06 ENCOUNTER — Other Ambulatory Visit: Payer: Self-pay

## 2017-03-06 DIAGNOSIS — R197 Diarrhea, unspecified: Secondary | ICD-10-CM

## 2017-03-06 MED ORDER — HYOSCYAMINE SULFATE 0.125 MG SL SUBL: 0.1250 mg | SUBLINGUAL_TABLET | Freq: Two times a day (BID) | SUBLINGUAL | 0 refills | Status: DC

## 2017-03-06 NOTE — Telephone Encounter (Signed)
Sent in Levsin 0.125mg  to [facility]. Pt is working on getting the stool collection. Pt picked the containers up from the lab but wants to follow the directions which were given by the lab. Pt states she will have study done soon.

## 2017-03-06 NOTE — Telephone Encounter (Signed)
Communication noted.  

## 2017-03-06 NOTE — Telephone Encounter (Signed)
Where is C. difficile and fecal elastase stool studies  previously ordered?????.  Besides Librax, we can try a course of Levsin sublingual 0.125 mg dispense 60 take  -  one under the tongue before meals and at bedtime as needed for abdominal cramps/diarrhea (Librax is my first choice)

## 2017-03-10 ENCOUNTER — Other Ambulatory Visit (HOSPITAL_COMMUNITY)
Admission: RE | Admit: 2017-03-10 | Discharge: 2017-03-10 | Disposition: A | Discharge status: Home / Self Care | Payer: Medicare Other | Source: Ambulatory Visit | Attending: Internal Medicine | Admitting: Internal Medicine

## 2017-03-10 DIAGNOSIS — R197 Diarrhea, unspecified: Secondary | ICD-10-CM | POA: Diagnosis not present

## 2017-03-11 LAB — PANCREATIC ELASTASE, FECAL: Pancreatic Elastase-1, Stool: 346 ug Elast./g (ref >200)

## 2017-03-20 ENCOUNTER — Ambulatory Visit (INDEPENDENT_AMBULATORY_CARE_PROVIDER_SITE_OTHER): Payer: Medicare Other | Admitting: *Deleted

## 2017-03-20 DIAGNOSIS — I482 Chronic atrial fibrillation: Secondary | ICD-10-CM | POA: Diagnosis not present

## 2017-03-20 DIAGNOSIS — I4821 Permanent atrial fibrillation: Secondary | ICD-10-CM

## 2017-03-20 DIAGNOSIS — Z5181 Encounter for therapeutic drug level monitoring: Secondary | ICD-10-CM | POA: Diagnosis not present

## 2017-03-20 LAB — POCT INR: INR: 3

## 2017-03-26 ENCOUNTER — Other Ambulatory Visit: Payer: Self-pay | Admitting: "Endocrinology

## 2017-03-26 DIAGNOSIS — G894 Chronic pain syndrome: Secondary | ICD-10-CM | POA: Diagnosis not present

## 2017-03-26 DIAGNOSIS — M545 Low back pain: Secondary | ICD-10-CM | POA: Diagnosis not present

## 2017-03-26 DIAGNOSIS — Z79891 Long term (current) use of opiate analgesic: Secondary | ICD-10-CM | POA: Diagnosis not present

## 2017-03-26 DIAGNOSIS — M25511 Pain in right shoulder: Secondary | ICD-10-CM | POA: Diagnosis not present

## 2017-03-26 DIAGNOSIS — M5416 Radiculopathy, lumbar region: Secondary | ICD-10-CM | POA: Diagnosis not present

## 2017-04-02 NOTE — Progress Notes (Signed)
Patient will test blood glucose 4 x daily. (with each meal & at bedtime)

## 2017-04-23 ENCOUNTER — Other Ambulatory Visit: Payer: Self-pay | Admitting: Gastroenterology

## 2017-04-23 ENCOUNTER — Other Ambulatory Visit: Payer: Self-pay

## 2017-04-23 MED ORDER — INSULIN GLARGINE 100 UNIT/ML SOLOSTAR PEN: PEN_INJECTOR | SUBCUTANEOUS | 2 refills | Status: DC

## 2017-05-01 ENCOUNTER — Ambulatory Visit (INDEPENDENT_AMBULATORY_CARE_PROVIDER_SITE_OTHER): Payer: Medicare Other | Admitting: *Deleted

## 2017-05-01 DIAGNOSIS — I482 Chronic atrial fibrillation: Secondary | ICD-10-CM

## 2017-05-01 DIAGNOSIS — Z5181 Encounter for therapeutic drug level monitoring: Secondary | ICD-10-CM | POA: Diagnosis not present

## 2017-05-01 DIAGNOSIS — I4821 Permanent atrial fibrillation: Secondary | ICD-10-CM

## 2017-05-01 LAB — POCT INR: INR: 2

## 2017-05-01 NOTE — Patient Instructions (Signed)
Continue coumadin 1/2 tablet daily except 1 tablet on Mondays, Wednesdays and Fridays Recheck 6 weeks

## 2017-05-05 ENCOUNTER — Other Ambulatory Visit: Payer: Self-pay

## 2017-05-05 MED ORDER — INSULIN GLARGINE 100 UNIT/ML SOLOSTAR PEN: PEN_INJECTOR | SUBCUTANEOUS | 2 refills | Status: DC

## 2017-05-06 ENCOUNTER — Other Ambulatory Visit: Payer: Self-pay

## 2017-05-06 MED ORDER — INSULIN GLARGINE 100 UNIT/ML SOLOSTAR PEN: PEN_INJECTOR | SUBCUTANEOUS | 2 refills | Status: DC

## 2017-05-08 ENCOUNTER — Other Ambulatory Visit: Payer: Self-pay

## 2017-05-08 MED ORDER — INSULIN GLARGINE 100 UNIT/ML SOLOSTAR PEN: PEN_INJECTOR | SUBCUTANEOUS | 2 refills | Status: DC

## 2017-05-14 DIAGNOSIS — I129 Hypertensive chronic kidney disease with stage 1 through stage 4 chronic kidney disease, or unspecified chronic kidney disease: Secondary | ICD-10-CM | POA: Diagnosis not present

## 2017-05-14 DIAGNOSIS — E559 Vitamin D deficiency, unspecified: Secondary | ICD-10-CM | POA: Diagnosis not present

## 2017-05-14 DIAGNOSIS — Z79899 Other long term (current) drug therapy: Secondary | ICD-10-CM | POA: Diagnosis not present

## 2017-05-14 DIAGNOSIS — R809 Proteinuria, unspecified: Secondary | ICD-10-CM | POA: Diagnosis not present

## 2017-05-14 DIAGNOSIS — D509 Iron deficiency anemia, unspecified: Secondary | ICD-10-CM | POA: Diagnosis not present

## 2017-05-14 DIAGNOSIS — N183 Chronic kidney disease, stage 3 (moderate): Secondary | ICD-10-CM | POA: Diagnosis not present

## 2017-05-20 ENCOUNTER — Other Ambulatory Visit: Payer: Self-pay | Admitting: Neurology

## 2017-05-20 DIAGNOSIS — M5416 Radiculopathy, lumbar region: Secondary | ICD-10-CM

## 2017-05-24 ENCOUNTER — Other Ambulatory Visit: Payer: Self-pay | Admitting: Cardiology

## 2017-05-26 DIAGNOSIS — Z79891 Long term (current) use of opiate analgesic: Secondary | ICD-10-CM | POA: Diagnosis not present

## 2017-05-26 DIAGNOSIS — M5416 Radiculopathy, lumbar region: Secondary | ICD-10-CM | POA: Diagnosis not present

## 2017-05-26 DIAGNOSIS — M545 Low back pain: Secondary | ICD-10-CM | POA: Diagnosis not present

## 2017-05-26 DIAGNOSIS — M25511 Pain in right shoulder: Secondary | ICD-10-CM | POA: Diagnosis not present

## 2017-05-27 DIAGNOSIS — N183 Chronic kidney disease, stage 3 (moderate): Secondary | ICD-10-CM | POA: Diagnosis not present

## 2017-05-27 DIAGNOSIS — N111 Chronic obstructive pyelonephritis: Secondary | ICD-10-CM | POA: Diagnosis not present

## 2017-05-27 DIAGNOSIS — M9080 Osteopathy in diseases classified elsewhere, unspecified site: Secondary | ICD-10-CM | POA: Diagnosis not present

## 2017-05-27 DIAGNOSIS — D631 Anemia in chronic kidney disease: Secondary | ICD-10-CM | POA: Diagnosis not present

## 2017-05-27 DIAGNOSIS — E889 Metabolic disorder, unspecified: Secondary | ICD-10-CM | POA: Diagnosis not present

## 2017-06-02 DIAGNOSIS — N183 Chronic kidney disease, stage 3 (moderate): Secondary | ICD-10-CM | POA: Diagnosis not present

## 2017-06-02 DIAGNOSIS — E1122 Type 2 diabetes mellitus with diabetic chronic kidney disease: Secondary | ICD-10-CM | POA: Diagnosis not present

## 2017-06-03 ENCOUNTER — Ambulatory Visit
Admission: RE | Admit: 2017-06-03 | Discharge: 2017-06-03 | Disposition: A | Discharge status: Home / Self Care | Payer: Medicare Other | Source: Ambulatory Visit | Attending: Neurology | Admitting: Neurology

## 2017-06-03 DIAGNOSIS — M48061 Spinal stenosis, lumbar region without neurogenic claudication: Secondary | ICD-10-CM | POA: Diagnosis not present

## 2017-06-03 DIAGNOSIS — M5416 Radiculopathy, lumbar region: Secondary | ICD-10-CM

## 2017-06-03 LAB — COMPLETE METABOLIC PANEL WITH GFR
AG Ratio: 1.3 (calc) (ref 1.0–2.5)
ALT: 10 U/L (ref 6–29)
AST: 16 U/L (ref 10–35)
Albumin: 4 g/dL (ref 3.6–5.1)
Alkaline phosphatase (APISO): 69 U/L (ref 33–130)
BILIRUBIN TOTAL: 0.8 mg/dL (ref 0.2–1.2)
BUN / CREAT RATIO: 15 (calc) (ref 6–22)
BUN: 21 mg/dL (ref 7–25)
CO2: 30 mmol/L (ref 20–32)
CREATININE: 1.42 mg/dL — AB (ref 0.60–0.88)
Calcium: 9.4 mg/dL (ref 8.6–10.4)
Chloride: 99 mmol/L (ref 98–110)
GFR, EST AFRICAN AMERICAN: 40 mL/min/{1.73_m2} — AB (ref >=60)
GFR, Est Non African American: 35 mL/min/{1.73_m2} — ABNORMAL LOW (ref >=60)
GLUCOSE: 136 mg/dL — AB (ref 65–99)
Globulin: 3 g/dL (calc) (ref 1.9–3.7)
Potassium: 4.1 mmol/L (ref 3.5–5.3)
Sodium: 137 mmol/L (ref 135–146)
TOTAL PROTEIN: 7 g/dL (ref 6.1–8.1)

## 2017-06-03 LAB — HEMOGLOBIN A1C
Hgb A1c MFr Bld: 8.6 % of total Hgb — ABNORMAL HIGH (ref <5.7)
MEAN PLASMA GLUCOSE: 200 (calc)
eAG (mmol/L): 11.1 (calc)

## 2017-06-06 ENCOUNTER — Ambulatory Visit (INDEPENDENT_AMBULATORY_CARE_PROVIDER_SITE_OTHER): Payer: Medicare Other | Admitting: "Endocrinology

## 2017-06-06 ENCOUNTER — Encounter: Payer: Self-pay | Admitting: "Endocrinology

## 2017-06-06 VITALS — BP 131/79 | HR 64 | Ht 63.0 in | Wt 208.0 lb

## 2017-06-06 DIAGNOSIS — N183 Chronic kidney disease, stage 3 unspecified: Secondary | ICD-10-CM

## 2017-06-06 DIAGNOSIS — E782 Mixed hyperlipidemia: Secondary | ICD-10-CM | POA: Diagnosis not present

## 2017-06-06 DIAGNOSIS — E1122 Type 2 diabetes mellitus with diabetic chronic kidney disease: Secondary | ICD-10-CM | POA: Diagnosis not present

## 2017-06-06 DIAGNOSIS — I10 Essential (primary) hypertension: Secondary | ICD-10-CM

## 2017-06-06 MED ORDER — INSULIN LISPRO 100 UNIT/ML (KWIKPEN): 10.0000 [IU] | PEN_INJECTOR | Freq: Three times a day (TID) | SUBCUTANEOUS | 2 refills | Status: DC
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
## 2017-06-06 NOTE — Progress Notes (Signed)
Pt brings in hernewFreestyle Libretoday.Shehas the 14day reader and sensors. Went over instructions on how to use the Libre system. Also went over instruction on how to properly apply the sensor to herarm. Libre sensor was placed on the back of pts upper left arm. Shevoices understanding on how to use the system and how to get BG reading for insulin injections and as needed. Shewas advised to contact the office if he has any questions or concerns 

## 2017-06-06 NOTE — Patient Instructions (Signed)

## 2017-06-12 ENCOUNTER — Ambulatory Visit (INDEPENDENT_AMBULATORY_CARE_PROVIDER_SITE_OTHER): Payer: Medicare Other | Admitting: *Deleted

## 2017-06-12 DIAGNOSIS — I4821 Permanent atrial fibrillation: Secondary | ICD-10-CM

## 2017-06-12 DIAGNOSIS — I482 Chronic atrial fibrillation: Secondary | ICD-10-CM | POA: Diagnosis not present

## 2017-06-12 DIAGNOSIS — Z5181 Encounter for therapeutic drug level monitoring: Secondary | ICD-10-CM | POA: Diagnosis not present

## 2017-06-12 LAB — POCT INR: INR: 1.9

## 2017-06-12 NOTE — Patient Instructions (Signed)
Take coumadin 1 tablet tonight then increase dose to 1 tablet daily except 1/2 tablet on Tuesdays, Thursdays and Saturdays Recheck 4 weeks

## 2017-06-16 ENCOUNTER — Encounter: Payer: Self-pay | Admitting: Family Medicine

## 2017-06-18 ENCOUNTER — Other Ambulatory Visit: Payer: Self-pay | Admitting: "Endocrinology

## 2017-06-18 ENCOUNTER — Other Ambulatory Visit: Payer: Self-pay | Admitting: Cardiology

## 2017-06-24 ENCOUNTER — Other Ambulatory Visit: Payer: Self-pay | Admitting: Cardiology

## 2017-07-03 ENCOUNTER — Other Ambulatory Visit: Payer: Self-pay | Admitting: Cardiology

## 2017-07-05 ENCOUNTER — Other Ambulatory Visit: Payer: Self-pay | Admitting: Cardiology

## 2017-07-10 ENCOUNTER — Ambulatory Visit (INDEPENDENT_AMBULATORY_CARE_PROVIDER_SITE_OTHER): Payer: Medicare Other | Admitting: *Deleted

## 2017-07-10 DIAGNOSIS — I482 Chronic atrial fibrillation: Secondary | ICD-10-CM | POA: Diagnosis not present

## 2017-07-10 DIAGNOSIS — Z5181 Encounter for therapeutic drug level monitoring: Secondary | ICD-10-CM | POA: Diagnosis not present

## 2017-07-10 DIAGNOSIS — I4821 Permanent atrial fibrillation: Secondary | ICD-10-CM

## 2017-07-10 LAB — POCT INR: INR: 3

## 2017-07-10 MED ORDER — METOPROLOL TARTRATE 25 MG PO TABS: ORAL_TABLET | ORAL | 1 refills | Status: DC

## 2017-07-10 NOTE — Patient Instructions (Addendum)
Continue coumadin 1 tablet daily except 1/2 tablet on Tuesdays, Thursdays and Saturdays Recheck 4 weeks

## 2017-07-24 DIAGNOSIS — M25511 Pain in right shoulder: Secondary | ICD-10-CM | POA: Diagnosis not present

## 2017-07-24 DIAGNOSIS — M545 Low back pain: Secondary | ICD-10-CM | POA: Diagnosis not present

## 2017-07-24 DIAGNOSIS — Z79891 Long term (current) use of opiate analgesic: Secondary | ICD-10-CM | POA: Diagnosis not present

## 2017-07-24 DIAGNOSIS — M5416 Radiculopathy, lumbar region: Secondary | ICD-10-CM | POA: Diagnosis not present

## 2017-07-25 DIAGNOSIS — S0190XA Unspecified open wound of unspecified part of head, initial encounter: Secondary | ICD-10-CM | POA: Diagnosis not present

## 2017-07-25 DIAGNOSIS — S79911A Unspecified injury of right hip, initial encounter: Secondary | ICD-10-CM | POA: Diagnosis not present

## 2017-07-26 DIAGNOSIS — S0101XA Laceration without foreign body of scalp, initial encounter: Secondary | ICD-10-CM | POA: Diagnosis not present

## 2017-07-26 DIAGNOSIS — J320 Chronic maxillary sinusitis: Secondary | ICD-10-CM | POA: Diagnosis present

## 2017-07-26 DIAGNOSIS — Z794 Long term (current) use of insulin: Secondary | ICD-10-CM | POA: Diagnosis not present

## 2017-07-26 DIAGNOSIS — S32049A Unspecified fracture of fourth lumbar vertebra, initial encounter for closed fracture: Secondary | ICD-10-CM | POA: Diagnosis present

## 2017-07-26 DIAGNOSIS — S065X9A Traumatic subdural hemorrhage with loss of consciousness of unspecified duration, initial encounter: Secondary | ICD-10-CM | POA: Diagnosis present

## 2017-07-26 DIAGNOSIS — S065X0A Traumatic subdural hemorrhage without loss of consciousness, initial encounter: Secondary | ICD-10-CM | POA: Diagnosis not present

## 2017-07-26 DIAGNOSIS — M549 Dorsalgia, unspecified: Secondary | ICD-10-CM | POA: Diagnosis not present

## 2017-07-26 DIAGNOSIS — R55 Syncope and collapse: Secondary | ICD-10-CM | POA: Diagnosis not present

## 2017-07-26 DIAGNOSIS — R4182 Altered mental status, unspecified: Secondary | ICD-10-CM | POA: Diagnosis not present

## 2017-07-26 DIAGNOSIS — I10 Essential (primary) hypertension: Secondary | ICD-10-CM | POA: Diagnosis not present

## 2017-07-26 DIAGNOSIS — D72829 Elevated white blood cell count, unspecified: Secondary | ICD-10-CM | POA: Diagnosis not present

## 2017-07-26 DIAGNOSIS — S14102A Unspecified injury at C2 level of cervical spinal cord, initial encounter: Secondary | ICD-10-CM | POA: Diagnosis not present

## 2017-07-26 DIAGNOSIS — E1165 Type 2 diabetes mellitus with hyperglycemia: Secondary | ICD-10-CM | POA: Diagnosis present

## 2017-07-26 DIAGNOSIS — G935 Compression of brain: Secondary | ICD-10-CM | POA: Diagnosis not present

## 2017-07-26 DIAGNOSIS — F17200 Nicotine dependence, unspecified, uncomplicated: Secondary | ICD-10-CM | POA: Diagnosis present

## 2017-07-26 DIAGNOSIS — R9431 Abnormal electrocardiogram [ECG] [EKG]: Secondary | ICD-10-CM | POA: Diagnosis not present

## 2017-07-26 DIAGNOSIS — M542 Cervicalgia: Secondary | ICD-10-CM | POA: Diagnosis not present

## 2017-07-26 DIAGNOSIS — S12600A Unspecified displaced fracture of seventh cervical vertebra, initial encounter for closed fracture: Secondary | ICD-10-CM | POA: Diagnosis not present

## 2017-07-26 DIAGNOSIS — Z7901 Long term (current) use of anticoagulants: Secondary | ICD-10-CM | POA: Diagnosis not present

## 2017-07-26 DIAGNOSIS — R414 Neurologic neglect syndrome: Secondary | ICD-10-CM | POA: Diagnosis present

## 2017-07-26 DIAGNOSIS — J9601 Acute respiratory failure with hypoxia: Secondary | ICD-10-CM | POA: Diagnosis not present

## 2017-07-26 DIAGNOSIS — M40202 Unspecified kyphosis, cervical region: Secondary | ICD-10-CM | POA: Diagnosis present

## 2017-07-26 DIAGNOSIS — S069X0A Unspecified intracranial injury without loss of consciousness, initial encounter: Secondary | ICD-10-CM | POA: Diagnosis not present

## 2017-07-26 DIAGNOSIS — I129 Hypertensive chronic kidney disease with stage 1 through stage 4 chronic kidney disease, or unspecified chronic kidney disease: Secondary | ICD-10-CM | POA: Diagnosis present

## 2017-07-26 DIAGNOSIS — W109XXA Fall (on) (from) unspecified stairs and steps, initial encounter: Secondary | ICD-10-CM | POA: Diagnosis not present

## 2017-07-26 DIAGNOSIS — E1122 Type 2 diabetes mellitus with diabetic chronic kidney disease: Secondary | ICD-10-CM | POA: Diagnosis present

## 2017-07-26 DIAGNOSIS — S32048A Other fracture of fourth lumbar vertebra, initial encounter for closed fracture: Secondary | ICD-10-CM | POA: Diagnosis not present

## 2017-07-26 DIAGNOSIS — I517 Cardiomegaly: Secondary | ICD-10-CM | POA: Diagnosis not present

## 2017-07-26 DIAGNOSIS — G8911 Acute pain due to trauma: Secondary | ICD-10-CM | POA: Diagnosis present

## 2017-07-26 DIAGNOSIS — S12100A Unspecified displaced fracture of second cervical vertebra, initial encounter for closed fracture: Secondary | ICD-10-CM | POA: Diagnosis not present

## 2017-07-26 DIAGNOSIS — I6200 Nontraumatic subdural hemorrhage, unspecified: Secondary | ICD-10-CM | POA: Diagnosis not present

## 2017-07-26 DIAGNOSIS — G8929 Other chronic pain: Secondary | ICD-10-CM | POA: Diagnosis present

## 2017-07-26 DIAGNOSIS — Z4682 Encounter for fitting and adjustment of non-vascular catheter: Secondary | ICD-10-CM | POA: Diagnosis not present

## 2017-07-26 DIAGNOSIS — W108XXA Fall (on) (from) other stairs and steps, initial encounter: Secondary | ICD-10-CM | POA: Diagnosis not present

## 2017-07-26 DIAGNOSIS — D6832 Hemorrhagic disorder due to extrinsic circulating anticoagulants: Secondary | ICD-10-CM | POA: Diagnosis present

## 2017-07-26 DIAGNOSIS — M4317 Spondylolisthesis, lumbosacral region: Secondary | ICD-10-CM | POA: Diagnosis present

## 2017-07-26 DIAGNOSIS — N135 Crossing vessel and stricture of ureter without hydronephrosis: Secondary | ICD-10-CM | POA: Diagnosis not present

## 2017-07-26 DIAGNOSIS — J9811 Atelectasis: Secondary | ICD-10-CM | POA: Diagnosis not present

## 2017-07-26 DIAGNOSIS — S12190A Other displaced fracture of second cervical vertebra, initial encounter for closed fracture: Secondary | ICD-10-CM | POA: Diagnosis not present

## 2017-07-26 DIAGNOSIS — N179 Acute kidney failure, unspecified: Secondary | ICD-10-CM | POA: Diagnosis present

## 2017-07-26 DIAGNOSIS — J9600 Acute respiratory failure, unspecified whether with hypoxia or hypercapnia: Secondary | ICD-10-CM | POA: Diagnosis not present

## 2017-07-26 DIAGNOSIS — M4316 Spondylolisthesis, lumbar region: Secondary | ICD-10-CM | POA: Diagnosis present

## 2017-07-26 DIAGNOSIS — S12690A Other displaced fracture of seventh cervical vertebra, initial encounter for closed fracture: Secondary | ICD-10-CM | POA: Diagnosis not present

## 2017-07-26 DIAGNOSIS — H5702 Anisocoria: Secondary | ICD-10-CM | POA: Diagnosis present

## 2017-07-26 DIAGNOSIS — I4891 Unspecified atrial fibrillation: Secondary | ICD-10-CM | POA: Diagnosis not present

## 2017-07-26 DIAGNOSIS — I878 Other specified disorders of veins: Secondary | ICD-10-CM | POA: Diagnosis not present

## 2017-07-26 DIAGNOSIS — I6201 Nontraumatic acute subdural hemorrhage: Secondary | ICD-10-CM | POA: Diagnosis not present

## 2017-07-26 DIAGNOSIS — E119 Type 2 diabetes mellitus without complications: Secondary | ICD-10-CM | POA: Diagnosis not present

## 2017-07-26 DIAGNOSIS — G8114 Spastic hemiplegia affecting left nondominant side: Secondary | ICD-10-CM | POA: Diagnosis present

## 2017-07-26 DIAGNOSIS — S066X0A Traumatic subarachnoid hemorrhage without loss of consciousness, initial encounter: Secondary | ICD-10-CM | POA: Diagnosis not present

## 2017-07-28 ENCOUNTER — Telehealth: Payer: Self-pay | Admitting: Family Medicine

## 2017-07-28 NOTE — Telephone Encounter (Signed)
Rowe Robert, surgical resident at [facility] in [place], [place], left message on nurse line requesting call back regarding patient. I returned call and was informed that patient had a bad fall, down several steps, and because of the Coumadin she takes, she obtained a subdural hematoma. She is in the surgical ICU at [facility] following a craniotomy to decompress/evacuate the subdural hematoma. Mr Rogue Bussing was calling to verify some of the patient's history. He states that on the CT scan she had in the hospital, they saw, on the right kidney, a ureteropelvic junction obstruction and he wanted to know if that was new. He states the patient has some family there that told him the patient's kidneys were 'not good.' I looked at the most recent CT taken in May 2018 and read this: "Adrenals/Urinary Tract: Adrenal glands are unremarkable. Left kidney and ureter are unremarkable. Urinary bladder appears normal. There is again noted severe right hydronephrosis most likely due to UPJ obstruction. No calculus or ureteral dilatation is noted." I informed Mr. Rogue Bussing of this.  Mr. Elvis Coil second concern was of the patient's medication. I reviewe medication list with him and the list he had and the list I had were identical.  This was all he needed. He states patient may be able to leave surgical ICU today.   His callback number is :  [phone]

## 2017-07-29 NOTE — Telephone Encounter (Signed)
He was advised that patient was new to [place] and followed by cardio. I have sent this to her cardiologist as well.

## 2017-07-29 NOTE — Telephone Encounter (Signed)
Please call back and advise that I have seen the patient on one occasion. She is followed by cadiology, gastroenterology, endocrinology, and nephrology. Advise that I am happy to see [name] in follow up, but if the concerns are related to [name] coumadin, he needs to talk with [name] cardiologist, please give him numbers and information for cardiology across the street. [name]. [name], MD

## 2017-07-31 DIAGNOSIS — R55 Syncope and collapse: Secondary | ICD-10-CM | POA: Diagnosis not present

## 2017-07-31 DIAGNOSIS — E119 Type 2 diabetes mellitus without complications: Secondary | ICD-10-CM | POA: Diagnosis not present

## 2017-07-31 DIAGNOSIS — R269 Unspecified abnormalities of gait and mobility: Secondary | ICD-10-CM | POA: Diagnosis not present

## 2017-07-31 DIAGNOSIS — D72829 Elevated white blood cell count, unspecified: Secondary | ICD-10-CM | POA: Diagnosis not present

## 2017-07-31 DIAGNOSIS — J320 Chronic maxillary sinusitis: Secondary | ICD-10-CM | POA: Diagnosis not present

## 2017-07-31 DIAGNOSIS — R739 Hyperglycemia, unspecified: Secondary | ICD-10-CM | POA: Diagnosis not present

## 2017-07-31 DIAGNOSIS — R627 Adult failure to thrive: Secondary | ICD-10-CM | POA: Diagnosis not present

## 2017-07-31 DIAGNOSIS — S064X0D Epidural hemorrhage without loss of consciousness, subsequent encounter: Secondary | ICD-10-CM | POA: Diagnosis not present

## 2017-07-31 DIAGNOSIS — R109 Unspecified abdominal pain: Secondary | ICD-10-CM | POA: Diagnosis not present

## 2017-07-31 DIAGNOSIS — W109XXA Fall (on) (from) unspecified stairs and steps, initial encounter: Secondary | ICD-10-CM | POA: Diagnosis not present

## 2017-07-31 DIAGNOSIS — G8929 Other chronic pain: Secondary | ICD-10-CM | POA: Diagnosis not present

## 2017-07-31 DIAGNOSIS — S32059A Unspecified fracture of fifth lumbar vertebra, initial encounter for closed fracture: Secondary | ICD-10-CM | POA: Diagnosis not present

## 2017-07-31 DIAGNOSIS — M797 Fibromyalgia: Secondary | ICD-10-CM | POA: Diagnosis not present

## 2017-07-31 DIAGNOSIS — Z5189 Encounter for other specified aftercare: Secondary | ICD-10-CM | POA: Diagnosis not present

## 2017-07-31 DIAGNOSIS — R279 Unspecified lack of coordination: Secondary | ICD-10-CM | POA: Diagnosis not present

## 2017-07-31 DIAGNOSIS — G8911 Acute pain due to trauma: Secondary | ICD-10-CM | POA: Diagnosis not present

## 2017-07-31 DIAGNOSIS — I129 Hypertensive chronic kidney disease with stage 1 through stage 4 chronic kidney disease, or unspecified chronic kidney disease: Secondary | ICD-10-CM | POA: Diagnosis not present

## 2017-07-31 DIAGNOSIS — S12100D Unspecified displaced fracture of second cervical vertebra, subsequent encounter for fracture with routine healing: Secondary | ICD-10-CM | POA: Diagnosis not present

## 2017-07-31 DIAGNOSIS — R918 Other nonspecific abnormal finding of lung field: Secondary | ICD-10-CM | POA: Diagnosis not present

## 2017-07-31 DIAGNOSIS — N390 Urinary tract infection, site not specified: Secondary | ICD-10-CM | POA: Diagnosis not present

## 2017-07-31 DIAGNOSIS — S069X0D Unspecified intracranial injury without loss of consciousness, subsequent encounter: Secondary | ICD-10-CM | POA: Diagnosis not present

## 2017-07-31 DIAGNOSIS — I10 Essential (primary) hypertension: Secondary | ICD-10-CM | POA: Diagnosis not present

## 2017-07-31 DIAGNOSIS — S12100A Unspecified displaced fracture of second cervical vertebra, initial encounter for closed fracture: Secondary | ICD-10-CM | POA: Diagnosis not present

## 2017-07-31 DIAGNOSIS — S065X0D Traumatic subdural hemorrhage without loss of consciousness, subsequent encounter: Secondary | ICD-10-CM | POA: Diagnosis not present

## 2017-07-31 DIAGNOSIS — G935 Compression of brain: Secondary | ICD-10-CM | POA: Diagnosis not present

## 2017-07-31 DIAGNOSIS — D136 Benign neoplasm of pancreas: Secondary | ICD-10-CM | POA: Diagnosis not present

## 2017-07-31 DIAGNOSIS — G250 Essential tremor: Secondary | ICD-10-CM | POA: Diagnosis not present

## 2017-07-31 DIAGNOSIS — Z9181 History of falling: Secondary | ICD-10-CM | POA: Diagnosis not present

## 2017-07-31 DIAGNOSIS — E162 Hypoglycemia, unspecified: Secondary | ICD-10-CM | POA: Diagnosis not present

## 2017-07-31 DIAGNOSIS — Z794 Long term (current) use of insulin: Secondary | ICD-10-CM | POA: Diagnosis not present

## 2017-07-31 DIAGNOSIS — S065X9A Traumatic subdural hemorrhage with loss of consciousness of unspecified duration, initial encounter: Secondary | ICD-10-CM | POA: Diagnosis not present

## 2017-07-31 DIAGNOSIS — I4891 Unspecified atrial fibrillation: Secondary | ICD-10-CM | POA: Diagnosis not present

## 2017-07-31 DIAGNOSIS — R414 Neurologic neglect syndrome: Secondary | ICD-10-CM | POA: Diagnosis not present

## 2017-07-31 DIAGNOSIS — E860 Dehydration: Secondary | ICD-10-CM | POA: Diagnosis not present

## 2017-07-31 DIAGNOSIS — E1165 Type 2 diabetes mellitus with hyperglycemia: Secondary | ICD-10-CM | POA: Diagnosis not present

## 2017-07-31 DIAGNOSIS — E1122 Type 2 diabetes mellitus with diabetic chronic kidney disease: Secondary | ICD-10-CM | POA: Diagnosis not present

## 2017-07-31 DIAGNOSIS — H5702 Anisocoria: Secondary | ICD-10-CM | POA: Diagnosis not present

## 2017-07-31 DIAGNOSIS — M4317 Spondylolisthesis, lumbosacral region: Secondary | ICD-10-CM | POA: Diagnosis not present

## 2017-07-31 DIAGNOSIS — S0100XD Unspecified open wound of scalp, subsequent encounter: Secondary | ICD-10-CM | POA: Diagnosis not present

## 2017-07-31 DIAGNOSIS — Z7901 Long term (current) use of anticoagulants: Secondary | ICD-10-CM | POA: Diagnosis not present

## 2017-07-31 DIAGNOSIS — F17200 Nicotine dependence, unspecified, uncomplicated: Secondary | ICD-10-CM | POA: Diagnosis not present

## 2017-07-31 DIAGNOSIS — S14102A Unspecified injury at C2 level of cervical spinal cord, initial encounter: Secondary | ICD-10-CM | POA: Diagnosis not present

## 2017-07-31 DIAGNOSIS — R2689 Other abnormalities of gait and mobility: Secondary | ICD-10-CM | POA: Diagnosis not present

## 2017-07-31 DIAGNOSIS — J9600 Acute respiratory failure, unspecified whether with hypoxia or hypercapnia: Secondary | ICD-10-CM | POA: Diagnosis not present

## 2017-07-31 DIAGNOSIS — R51 Headache: Secondary | ICD-10-CM | POA: Diagnosis not present

## 2017-07-31 DIAGNOSIS — S134XXD Sprain of ligaments of cervical spine, subsequent encounter: Secondary | ICD-10-CM | POA: Diagnosis not present

## 2017-07-31 DIAGNOSIS — R002 Palpitations: Secondary | ICD-10-CM | POA: Diagnosis not present

## 2017-07-31 DIAGNOSIS — S32049A Unspecified fracture of fourth lumbar vertebra, initial encounter for closed fracture: Secondary | ICD-10-CM | POA: Diagnosis not present

## 2017-07-31 DIAGNOSIS — D6832 Hemorrhagic disorder due to extrinsic circulating anticoagulants: Secondary | ICD-10-CM | POA: Diagnosis not present

## 2017-07-31 DIAGNOSIS — I69854 Hemiplegia and hemiparesis following other cerebrovascular disease affecting left non-dominant side: Secondary | ICD-10-CM | POA: Diagnosis not present

## 2017-07-31 DIAGNOSIS — S065X9D Traumatic subdural hemorrhage with loss of consciousness of unspecified duration, subsequent encounter: Secondary | ICD-10-CM | POA: Diagnosis not present

## 2017-07-31 DIAGNOSIS — I959 Hypotension, unspecified: Secondary | ICD-10-CM | POA: Diagnosis not present

## 2017-07-31 DIAGNOSIS — D62 Acute posthemorrhagic anemia: Secondary | ICD-10-CM | POA: Diagnosis not present

## 2017-07-31 DIAGNOSIS — N179 Acute kidney failure, unspecified: Secondary | ICD-10-CM | POA: Diagnosis not present

## 2017-07-31 DIAGNOSIS — M40202 Unspecified kyphosis, cervical region: Secondary | ICD-10-CM | POA: Diagnosis not present

## 2017-07-31 DIAGNOSIS — I6200 Nontraumatic subdural hemorrhage, unspecified: Secondary | ICD-10-CM | POA: Diagnosis not present

## 2017-07-31 DIAGNOSIS — J9811 Atelectasis: Secondary | ICD-10-CM | POA: Diagnosis not present

## 2017-07-31 DIAGNOSIS — R4182 Altered mental status, unspecified: Secondary | ICD-10-CM | POA: Diagnosis not present

## 2017-07-31 DIAGNOSIS — S12600A Unspecified displaced fracture of seventh cervical vertebra, initial encounter for closed fracture: Secondary | ICD-10-CM | POA: Diagnosis not present

## 2017-07-31 DIAGNOSIS — G8114 Spastic hemiplegia affecting left nondominant side: Secondary | ICD-10-CM | POA: Diagnosis not present

## 2017-07-31 DIAGNOSIS — M4316 Spondylolisthesis, lumbar region: Secondary | ICD-10-CM | POA: Diagnosis not present

## 2017-08-02 DIAGNOSIS — W109XXA Fall (on) (from) unspecified stairs and steps, initial encounter: Secondary | ICD-10-CM | POA: Diagnosis not present

## 2017-08-02 DIAGNOSIS — Z7901 Long term (current) use of anticoagulants: Secondary | ICD-10-CM | POA: Diagnosis not present

## 2017-08-02 DIAGNOSIS — M797 Fibromyalgia: Secondary | ICD-10-CM | POA: Diagnosis present

## 2017-08-02 DIAGNOSIS — S12600A Unspecified displaced fracture of seventh cervical vertebra, initial encounter for closed fracture: Secondary | ICD-10-CM | POA: Diagnosis present

## 2017-08-02 DIAGNOSIS — K439 Ventral hernia without obstruction or gangrene: Secondary | ICD-10-CM | POA: Diagnosis present

## 2017-08-02 DIAGNOSIS — G8194 Hemiplegia, unspecified affecting left nondominant side: Secondary | ICD-10-CM | POA: Diagnosis not present

## 2017-08-02 DIAGNOSIS — R112 Nausea with vomiting, unspecified: Secondary | ICD-10-CM | POA: Diagnosis not present

## 2017-08-02 DIAGNOSIS — S12100A Unspecified displaced fracture of second cervical vertebra, initial encounter for closed fracture: Secondary | ICD-10-CM | POA: Diagnosis not present

## 2017-08-02 DIAGNOSIS — I959 Hypotension, unspecified: Secondary | ICD-10-CM | POA: Diagnosis not present

## 2017-08-02 DIAGNOSIS — D649 Anemia, unspecified: Secondary | ICD-10-CM | POA: Diagnosis present

## 2017-08-02 DIAGNOSIS — F17200 Nicotine dependence, unspecified, uncomplicated: Secondary | ICD-10-CM | POA: Diagnosis present

## 2017-08-02 DIAGNOSIS — E860 Dehydration: Secondary | ICD-10-CM | POA: Diagnosis not present

## 2017-08-02 DIAGNOSIS — S065X0A Traumatic subdural hemorrhage without loss of consciousness, initial encounter: Secondary | ICD-10-CM | POA: Diagnosis not present

## 2017-08-02 DIAGNOSIS — M48061 Spinal stenosis, lumbar region without neurogenic claudication: Secondary | ICD-10-CM | POA: Diagnosis present

## 2017-08-02 DIAGNOSIS — J398 Other specified diseases of upper respiratory tract: Secondary | ICD-10-CM | POA: Diagnosis not present

## 2017-08-02 DIAGNOSIS — I10 Essential (primary) hypertension: Secondary | ICD-10-CM | POA: Diagnosis present

## 2017-08-02 DIAGNOSIS — G8911 Acute pain due to trauma: Secondary | ICD-10-CM | POA: Diagnosis present

## 2017-08-02 DIAGNOSIS — R627 Adult failure to thrive: Secondary | ICD-10-CM | POA: Diagnosis present

## 2017-08-02 DIAGNOSIS — E46 Unspecified protein-calorie malnutrition: Secondary | ICD-10-CM | POA: Diagnosis not present

## 2017-08-02 DIAGNOSIS — M4802 Spinal stenosis, cervical region: Secondary | ICD-10-CM | POA: Diagnosis present

## 2017-08-02 DIAGNOSIS — R4182 Altered mental status, unspecified: Secondary | ICD-10-CM | POA: Diagnosis not present

## 2017-08-02 DIAGNOSIS — N179 Acute kidney failure, unspecified: Secondary | ICD-10-CM | POA: Diagnosis not present

## 2017-08-02 DIAGNOSIS — E1165 Type 2 diabetes mellitus with hyperglycemia: Secondary | ICD-10-CM | POA: Diagnosis present

## 2017-08-02 DIAGNOSIS — I4891 Unspecified atrial fibrillation: Secondary | ICD-10-CM | POA: Diagnosis not present

## 2017-08-02 DIAGNOSIS — J9811 Atelectasis: Secondary | ICD-10-CM | POA: Diagnosis not present

## 2017-08-02 DIAGNOSIS — E875 Hyperkalemia: Secondary | ICD-10-CM | POA: Diagnosis present

## 2017-08-02 DIAGNOSIS — R5383 Other fatigue: Secondary | ICD-10-CM | POA: Diagnosis not present

## 2017-08-02 DIAGNOSIS — K5900 Constipation, unspecified: Secondary | ICD-10-CM | POA: Diagnosis present

## 2017-08-02 DIAGNOSIS — N390 Urinary tract infection, site not specified: Secondary | ICD-10-CM | POA: Diagnosis not present

## 2017-08-02 DIAGNOSIS — S066X0A Traumatic subarachnoid hemorrhage without loss of consciousness, initial encounter: Secondary | ICD-10-CM | POA: Diagnosis not present

## 2017-08-02 DIAGNOSIS — M4312 Spondylolisthesis, cervical region: Secondary | ICD-10-CM | POA: Diagnosis present

## 2017-08-02 DIAGNOSIS — E119 Type 2 diabetes mellitus without complications: Secondary | ICD-10-CM | POA: Diagnosis not present

## 2017-08-02 DIAGNOSIS — R55 Syncope and collapse: Secondary | ICD-10-CM | POA: Diagnosis not present

## 2017-08-06 DIAGNOSIS — W010XXA Fall on same level from slipping, tripping and stumbling without subsequent striking against object, initial encounter: Secondary | ICD-10-CM | POA: Diagnosis not present

## 2017-08-06 DIAGNOSIS — I959 Hypotension, unspecified: Secondary | ICD-10-CM | POA: Diagnosis not present

## 2017-08-06 DIAGNOSIS — R110 Nausea: Secondary | ICD-10-CM | POA: Diagnosis not present

## 2017-08-06 DIAGNOSIS — S065X9A Traumatic subdural hemorrhage with loss of consciousness of unspecified duration, initial encounter: Secondary | ICD-10-CM | POA: Diagnosis not present

## 2017-08-06 DIAGNOSIS — M25559 Pain in unspecified hip: Secondary | ICD-10-CM | POA: Diagnosis not present

## 2017-08-06 DIAGNOSIS — E1165 Type 2 diabetes mellitus with hyperglycemia: Secondary | ICD-10-CM | POA: Diagnosis present

## 2017-08-06 DIAGNOSIS — E119 Type 2 diabetes mellitus without complications: Secondary | ICD-10-CM | POA: Diagnosis not present

## 2017-08-06 DIAGNOSIS — R739 Hyperglycemia, unspecified: Secondary | ICD-10-CM | POA: Diagnosis not present

## 2017-08-06 DIAGNOSIS — Z48811 Encounter for surgical aftercare following surgery on the nervous system: Secondary | ICD-10-CM | POA: Diagnosis not present

## 2017-08-06 DIAGNOSIS — M62838 Other muscle spasm: Secondary | ICD-10-CM | POA: Diagnosis present

## 2017-08-06 DIAGNOSIS — S12100D Unspecified displaced fracture of second cervical vertebra, subsequent encounter for fracture with routine healing: Secondary | ICD-10-CM | POA: Diagnosis not present

## 2017-08-06 DIAGNOSIS — S12101A Unspecified nondisplaced fracture of second cervical vertebra, initial encounter for closed fracture: Secondary | ICD-10-CM | POA: Diagnosis not present

## 2017-08-06 DIAGNOSIS — R5381 Other malaise: Secondary | ICD-10-CM | POA: Diagnosis not present

## 2017-08-06 DIAGNOSIS — G9389 Other specified disorders of brain: Secondary | ICD-10-CM | POA: Diagnosis not present

## 2017-08-06 DIAGNOSIS — S065X9D Traumatic subdural hemorrhage with loss of consciousness of unspecified duration, subsequent encounter: Secondary | ICD-10-CM | POA: Diagnosis not present

## 2017-08-06 DIAGNOSIS — Z6837 Body mass index (BMI) 37.0-37.9, adult: Secondary | ICD-10-CM | POA: Diagnosis not present

## 2017-08-06 DIAGNOSIS — R234 Changes in skin texture: Secondary | ICD-10-CM | POA: Diagnosis not present

## 2017-08-06 DIAGNOSIS — I10 Essential (primary) hypertension: Secondary | ICD-10-CM | POA: Diagnosis not present

## 2017-08-06 DIAGNOSIS — M25561 Pain in right knee: Secondary | ICD-10-CM | POA: Diagnosis not present

## 2017-08-06 DIAGNOSIS — F419 Anxiety disorder, unspecified: Secondary | ICD-10-CM | POA: Diagnosis not present

## 2017-08-06 DIAGNOSIS — D181 Lymphangioma, any site: Secondary | ICD-10-CM | POA: Diagnosis not present

## 2017-08-06 DIAGNOSIS — Z7901 Long term (current) use of anticoagulants: Secondary | ICD-10-CM | POA: Diagnosis not present

## 2017-08-06 DIAGNOSIS — R4182 Altered mental status, unspecified: Secondary | ICD-10-CM | POA: Diagnosis not present

## 2017-08-06 DIAGNOSIS — N179 Acute kidney failure, unspecified: Secondary | ICD-10-CM | POA: Diagnosis not present

## 2017-08-06 DIAGNOSIS — E878 Other disorders of electrolyte and fluid balance, not elsewhere classified: Secondary | ICD-10-CM | POA: Diagnosis not present

## 2017-08-06 DIAGNOSIS — D649 Anemia, unspecified: Secondary | ICD-10-CM | POA: Diagnosis not present

## 2017-08-06 DIAGNOSIS — S12100A Unspecified displaced fracture of second cervical vertebra, initial encounter for closed fracture: Secondary | ICD-10-CM | POA: Diagnosis not present

## 2017-08-06 DIAGNOSIS — E785 Hyperlipidemia, unspecified: Secondary | ICD-10-CM | POA: Diagnosis not present

## 2017-08-06 DIAGNOSIS — N390 Urinary tract infection, site not specified: Secondary | ICD-10-CM | POA: Diagnosis not present

## 2017-08-06 DIAGNOSIS — I4891 Unspecified atrial fibrillation: Secondary | ICD-10-CM | POA: Diagnosis not present

## 2017-08-13 DIAGNOSIS — D181 Lymphangioma, any site: Secondary | ICD-10-CM | POA: Diagnosis not present

## 2017-08-13 DIAGNOSIS — S065X9A Traumatic subdural hemorrhage with loss of consciousness of unspecified duration, initial encounter: Secondary | ICD-10-CM | POA: Diagnosis not present

## 2017-08-14 ENCOUNTER — Ambulatory Visit: Payer: Self-pay | Admitting: Cardiology

## 2017-08-21 ENCOUNTER — Telehealth: Payer: Self-pay

## 2017-08-21 NOTE — Telephone Encounter (Signed)
Wants to know what you recommend. Home health is an option too if the patient is homebound (i'm not familiar with the patient)

## 2017-08-21 NOTE — Telephone Encounter (Signed)
Patients daughter is requesting speech therapy, occupational therapy, and physical therapy.  Does [facility] offer all of these ? Cb#: [phone]

## 2017-08-21 NOTE — Telephone Encounter (Signed)
I am going to have to see her and evaluate her before I can order the services.

## 2017-08-21 NOTE — Telephone Encounter (Signed)
Called and LMOM that she was wanting a call back to discuss rehab for her mother  [phone]

## 2017-08-22 ENCOUNTER — Telehealth: Payer: Self-pay | Admitting: Family Medicine

## 2017-08-22 DIAGNOSIS — G9389 Other specified disorders of brain: Secondary | ICD-10-CM | POA: Diagnosis not present

## 2017-08-22 DIAGNOSIS — I639 Cerebral infarction, unspecified: Secondary | ICD-10-CM

## 2017-08-22 NOTE — Telephone Encounter (Signed)
Spoke with Juliann Pulse (daughter) and discussed Dr.Hagler's recommendations with verbal understanding. They would like to be referred for outpatient services for now and when they come in for their visit discuss possibly being referred for in home health if Dr.Hagler feels they would benefit from it. They would also like to remain within the [facility] system so that Dr.Hagler can see her progress notes.

## 2017-08-22 NOTE — Telephone Encounter (Signed)
Spoke with daughter and discussed Dr.Hagler's recommendations. She stated she couldn't wait until 6/12 for her mom to wait to begin PT and to cross her fingers hoping someone cancelled their appt. I told her that even if Dr.Hagler made the referrals, whoever she referred her to would require the last ov notes and the would need to be recent and wouldn't pull the referral until they had all the information. She asked if she should change the PCP. I told her I couldn't tell her to change PCP's that was her choice whether or not to change PCP's. She stated she would discuss it with her mom. I told her I would let Dr.Hagler know of our conversation.

## 2017-08-22 NOTE — Telephone Encounter (Signed)
Please call and advised that we can try to get patient in for sooner appointment.  Advised him that I have to certify when ordering skilled services such as PT, OT and speech therapy that I have evaluated this patient and I have to give and certify the date that I have seen and evaluated.  She would need to come in and be seen.  The other option is to have the social worker from the hospital where the patient was treated to order the services here.  I will assist in any way possible.  I am not trying to be difficult in relation to this but I also have to follow guidelines and rules/regulations.  Please see how they would like to move forward.  If she is wanting services not at home and at the outpatient setting I can go ahead and do this.  However, if she is wanting homebound services then I have to have seen the patient document the need in my note, and submit this in order for this to be paid for by Medicare.

## 2017-08-22 NOTE — Telephone Encounter (Signed)
Advise that referral have been placed to OT, PT, and speech therapy.

## 2017-08-22 NOTE — Telephone Encounter (Signed)
Patients daughter Juliann Pulse made aware Dr.Hagler has placed referrals for OT, Speech, and PT with verbal understanding.

## 2017-08-27 DIAGNOSIS — M545 Low back pain: Secondary | ICD-10-CM | POA: Diagnosis not present

## 2017-08-27 DIAGNOSIS — M25511 Pain in right shoulder: Secondary | ICD-10-CM | POA: Diagnosis not present

## 2017-08-27 DIAGNOSIS — G894 Chronic pain syndrome: Secondary | ICD-10-CM | POA: Diagnosis not present

## 2017-08-27 DIAGNOSIS — M5416 Radiculopathy, lumbar region: Secondary | ICD-10-CM | POA: Diagnosis not present

## 2017-08-27 DIAGNOSIS — Z79891 Long term (current) use of opiate analgesic: Secondary | ICD-10-CM | POA: Diagnosis not present

## 2017-08-28 ENCOUNTER — Telehealth: Payer: Self-pay | Admitting: *Deleted

## 2017-08-28 NOTE — Telephone Encounter (Signed)
Spoke with patient.  She was visiting her daughter in [place], [place] and fell down 21 steps.  Was transported by EMS to [facility] in [place], [place].  She had a subdural hematoma, that required craniotomy, and 2 broken bones in her neck.  She was taken off coumadin and now wants to know when she should start it back.  Told pt that decision would have to be made by a MD.  She had an appt with Dr Jacqlyn Krauss yesterday.  His office has requested her hospital records.  Told pt to follow up with his office re. Coumadin due to her neurological status. She agreed and will let me know what he decided.  She has a follow up with Dr Domenic Polite in June.

## 2017-09-03 ENCOUNTER — Ambulatory Visit (HOSPITAL_COMMUNITY): Payer: Medicare Other | Admitting: Physical Therapy

## 2017-09-03 ENCOUNTER — Encounter (HOSPITAL_COMMUNITY): Payer: Self-pay | Admitting: Physical Therapy

## 2017-09-03 ENCOUNTER — Encounter (HOSPITAL_COMMUNITY): Payer: Self-pay | Admitting: Occupational Therapy

## 2017-09-03 ENCOUNTER — Other Ambulatory Visit: Payer: Self-pay

## 2017-09-03 ENCOUNTER — Encounter (HOSPITAL_COMMUNITY): Payer: Self-pay | Admitting: Speech Pathology

## 2017-09-03 ENCOUNTER — Ambulatory Visit (HOSPITAL_COMMUNITY): Payer: Medicare Other | Admitting: Occupational Therapy

## 2017-09-03 ENCOUNTER — Ambulatory Visit (HOSPITAL_COMMUNITY): Payer: Medicare Other | Attending: Family Medicine | Admitting: Speech Pathology

## 2017-09-03 DIAGNOSIS — R29898 Other symptoms and signs involving the musculoskeletal system: Secondary | ICD-10-CM | POA: Diagnosis not present

## 2017-09-03 DIAGNOSIS — Z9889 Other specified postprocedural states: Secondary | ICD-10-CM | POA: Diagnosis not present

## 2017-09-03 DIAGNOSIS — M6281 Muscle weakness (generalized): Secondary | ICD-10-CM

## 2017-09-03 DIAGNOSIS — Z8679 Personal history of other diseases of the circulatory system: Secondary | ICD-10-CM

## 2017-09-03 DIAGNOSIS — R41841 Cognitive communication deficit: Secondary | ICD-10-CM

## 2017-09-03 NOTE — Therapy (Signed)
[place], [place], [address] Phone: [phone]   Fax:  [phone]  Speech Language Pathology Evaluation  Patient Details  Name: [name] MRN: [id number] Date of Birth: 04/21/1934 Referring Provider: Caren Macadam, MD   Encounter Date: 09/03/2017  End of Session - 09/03/17 1429    Visit Number  1    Number of Visits  4    Date for SLP Re-Evaluation  10/09/17    Authorization Type  Medicare and [employer] secondary    SLP Start Time  1306    SLP Stop Time   1352    SLP Time Calculation (min)  46 min    Activity Tolerance  Patient tolerated treatment well       Past Medical History:  Diagnosis Date  . Anxiety   . Asthma   . Chronic back pain   . Colonoscopy causing post-procedural bleeding    Incomplete. by Dr. [name] 06/11/99 due to fixed non-compliant colon precluded exam to 40 cm, she had subsequent colectomy for diverticulitis since that time.   . Diarrhea   . Esophageal reflux   . Essential hypertension, benign   . Fibromyalgia   . Hiatal hernia    Recent EGD/TCS showed small hiatal hernia, normal apperaring tubular esophagus. s/p passage of a 54-french Maloney dilator, s/p biopsy esophageal mucosa, multiple fundal gland type gastric polyps. one large pedunculated polp with oozing, noted s/p clipping and snare polypectomy. Biopsies of the gastric mucosa taken given her symptoms in her elevated count. normal D1-D3, s/p biospy D2-D3.   [name] Hiatal hernia    all bx unremarkable. on TCS she had left-sided diverticula, evidence of prior segmental resection with anastomosis, multiple colonic polyps s/p multiple snare polypectomies, s/p segmental biopsy and stool sampling. she had multiple tubular adenomas. no microscopic/collagenous colitis. stool culture, c diff, O+P, lactoferrin were negative.   . Mixed hyperlipidemia   . Nephrolithiasis   . OSA (obstructive sleep apnea)   . Permanent atrial fibrillation (HCC)     Previous failed DCCV  . Type 2 diabetes mellitus ([place])   . Ureteral stent retained    Not retained. ureteral stent removal gross hematuria resolved 10/11. coumadin restarted.   . Ventral hernia     Past Surgical History:  Procedure Laterality Date  . BREAST LUMPECTOMY     benign cyst  . CATARACT EXTRACTION W/PHACO Left 08/17/2012   Procedure: CATARACT EXTRACTION PHACO AND INTRAOCULAR LENS PLACEMENT (IOC);  Surgeon: [name], MD;  Location: [facility];  Service: Ophthalmology;  Laterality: Left;  CDE:18.73  . COLECTOMY     2005. Dr. Geoffry Paradise for diverticulitis  . COLONOSCOPY  02/22/09   normal/left-sided diverticula/multiple colonic polyp, adenomatous  . COLONOSCOPY  12/16/2011   colonic polyps-treated as described above. Status postprior sigmoid resection. adenomatous. next TCS 12/2014  . COLONOSCOPY N/A 12/28/2014   Status post prior segmental resection. Few residual colonic diverticula- status post segmental biopsy negative random colon biopsies  . ESOPHAGEAL DILATION N/A 12/28/2014   Procedure: ESOPHAGEAL DILATION;  Surgeon: [name], MD;  Location: [facility];  Service: Endoscopy;  Laterality: N/A;  . ESOPHAGOGASTRODUODENOSCOPY  02/22/09   normal s/p dilator;small HH/one large pedunculates polyp  s/p clipping, hyperplastic  . ESOPHAGOGASTRODUODENOSCOPY  12/16/2011   [name]: small hiatal hernia. Hyperplastic apperating polyps. Status post [name] dilation as described above.  . ESOPHAGOGASTRODUODENOSCOPY N/A 12/28/2014   RMR: Normal-appearing esophagus status post passage of a maloney dilator. Hiatal hernia. abnormal gastic mucosa of  uncertain significance status post gastric biopsy with mild chronic gastritis, no H pylori.   . TONSILLECTOMY    . TOTAL ABDOMINAL HYSTERECTOMY      There were no vitals filed for this visit.  Subjective Assessment - 09/03/17 1333    Subjective  "I can't remember things."    Patient is accompained by:  Family member Son, Jeneen Rinks    Special Tests   MoCA    Currently in Pain?  Yes    Pain Score  8     Pain Location  Neck    Pain Orientation  Left         SLP Evaluation [facility] - 09/03/17 1333      SLP Visit Information   SLP Received On  09/03/17    Referring Provider  Caren Macadam, MD    Onset Date  07/25/2017    Medical Diagnosis  s/p SDH and C2 fracture s/p fall      Subjective   Subjective  "I can't remember things."      General Information   HPI  [name] is an 83 yo female who was referr    Behavioral/Cognition  Alert and cooperative    Mobility Status  currently in a wheel chair and has cervical collar      Balance Screen   Has the patient fallen in the past 6 months  Yes    How many times?  1    Has the patient had a decrease in activity level because of a fear of falling?   Yes    Is the patient reluctant to leave their home because of a fear of falling?   Yes      Prior Functional Status   Cognitive/Linguistic Baseline  Within functional limits    Type of Home  Mobile home     Lives With  Son    Available Support  Family    Education  8th education    Vocation  Retired      Cognition   Overall Cognitive Status  Impaired/Different from baseline    Area of Impairment  Attention;Memory    Current Attention Level  Sustained    Memory  Decreased short-term memory    Memory Comments  3/5 word recall after 5 minutes    Attention  Selective;Sustained    Sustained Attention  Impaired    Sustained Attention Impairment  Verbal complex;Functional complex    Selective Attention  Impaired    Memory  Impaired    Memory Impairment  Retrieval deficit;Decreased short term memory    Decreased Short Term Memory  Verbal complex    Awareness  Appears intact    Problem Solving  Appears intact      Auditory Comprehension   Overall Auditory Comprehension  Appears within functional limits for tasks assessed    Yes/No Questions  Within Functional Limits    Commands  Within Functional Limits    Conversation  Complex     Interfering Components  Attention;Working Solicitor;Extra processing time      Visual Recognition/Discrimination   Discrimination  Within Function Limits      Reading Comprehension   Reading Status  Not tested      Expression   Primary Mode of Expression  Verbal      Verbal Expression   Overall Verbal Expression  Appears within functional limits for tasks assessed    Initiation  No impairment    Automatic Speech  Name;Social Response;Counting;Day  of week    Level of Generative/Spontaneous Verbalization  Conversation    Repetition  No impairment    Naming  No impairment    Pragmatics  No impairment    Non-Verbal Means of Communication  Not applicable      Written Expression   Dominant Hand  Left    Written Expression  Within Functional Limits      Oral Motor/Sensory Function   Overall Oral Motor/Sensory Function  Appears within functional limits for tasks assessed      Motor Speech   Overall Motor Speech  Appears within functional limits for tasks assessed    Respiration  Within functional limits    Phonation  Normal    Resonance  Within functional limits    Articulation  Within functional limitis    Intelligibility  Intelligible    Motor Planning  Witnin functional limits    Motor Speech Errors  Not applicable    Phonation  WFL      Standardized Assessments   Standardized Assessments   Montreal Cognitive Assessment (MOCA)    Montreal Cognitive Assessment (MOCA)   26/30         SLP Short Term Goals - 09/03/17 1442      SLP SHORT TERM GOAL #1   Title  Pt will implement memory strategies in functional therapy activities with 90% acc with min cues.    Baseline  60%    Time  4    Period  Weeks    Status  New    Target Date  10/09/17      SLP SHORT TERM GOAL #2   Title  Pt will complete selective and alternating attention tasks (moderately complex) with 90% acc and min cues.    Baseline  min/mod cues and extra time to complete     Time  4    Period  Weeks    Status  New    Target Date  10/09/17      SLP SHORT TERM GOAL #3   Title  Pt will implement word-finding strategies with 90% accuracy when unable to verbalize desired word in conversation/functional tasks with min assist.    Baseline  Pt with circumlocutions at times in conversation    Time  4    Period  Weeks    Status  New    Target Date  10/09/17       SLP Long Term Goals - 09/03/17 1450      SLP LONG TERM GOAL #1   Title  Same as short term goals above       Plan - 09/03/17 1435    Clinical Impression Statement  Pt presents with mild cognitive linguistic impairment s/p SDH evacuation following a fall on 07/25/2017. Pt with a self report of difficulty remembering, concentrating, and selecting the appropriate words to use. The MoCA 7.1 was administered in session and Pt scored 26/30. Pt demonstrated deficits in attention, planning, and working memory, which she attempts to compensate for with verbal mediation strategies and by allowing extra time to complete. Pt previously enjoyed gardening, cooking, painting, working crossword puzzles, and reading novels. She reports feeling frustrated and limited by her physical challenges at this time. She sustained a C2 fracture and is wearing a cervical collar 24 hours/day at this time.  Pt will benefit from skilled SLP in order to address the above impairments, maximize independence, and decrease burden of care.   Speech Therapy Frequency  1x /week    Duration  4 weeks    Treatment/Interventions  Cognitive reorganization;SLP instruction and feedback;Internal/external aids;Compensatory strategies;Compensatory techniques;Patient/family education;Functional tasks;Cueing hierarchy    Potential to Achieve Goals  Good    Potential Considerations  Financial resources ? vision changes vs. damaged glasses for reading    SLP Home Exercise Plan  Pt will complete HEP as assigned to facilitate carryover of treatment strategies  and techniques in home environment with assist from family.    Consulted and Agree with Plan of Care  Patient;Family member/caregiver    Family Member Consulted  Son, Jeneen Rinks       Patient will benefit from skilled therapeutic intervention in order to improve the following deficits and impairments:   Cognitive communication deficit    Problem List Patient Active Problem List   Diagnosis Date Noted  . RLQ abdominal pain 08/15/2016  . Diabetic complication ([place]) 64/68/0321  . Mucosal abnormality of stomach   . Diverticulosis of colon without hemorrhage   . Dysphagia, pharyngoesophageal phase   . Esophageal dysphagia 12/02/2014  . LLQ pain 02/22/2014  . Abdominal pain, epigastric 02/22/2014  . Encounter for therapeutic drug monitoring 05/18/2013  . Adenomatous polyps 11/21/2011  . Long term (current) use of anticoagulants 07/03/2010  . Permanent atrial fibrillation ([place]) 08/04/2009  . RENAL DISEASE, CHRONIC, STAGE III 08/04/2009  . IBS 04/14/2009  . Diabetes mellitus with stage 3 chronic kidney disease ([place]) 02/17/2009  . ANXIETY 02/17/2009  . GERD 02/17/2009  . DIARRHEA, CHRONIC 02/17/2009  . Mixed hyperlipidemia 12/21/2008  . Essential hypertension, benign 12/21/2008   Thank you,  Genene Churn, [place]  [facility] 09/03/2017, 2:51 PM  [place] [address] [place], [place], [address] Phone: [phone]   Fax:  [phone]  Name: [name] MRN: [id number] Date of Birth: [birth year]

## 2017-09-04 ENCOUNTER — Encounter (HOSPITAL_COMMUNITY): Payer: Self-pay | Admitting: Physical Therapy

## 2017-09-04 ENCOUNTER — Telehealth (HOSPITAL_COMMUNITY): Payer: Self-pay | Admitting: Family Medicine

## 2017-09-04 DIAGNOSIS — E1122 Type 2 diabetes mellitus with diabetic chronic kidney disease: Secondary | ICD-10-CM | POA: Diagnosis not present

## 2017-09-04 DIAGNOSIS — N183 Chronic kidney disease, stage 3 (moderate): Secondary | ICD-10-CM | POA: Diagnosis not present

## 2017-09-04 DIAGNOSIS — E782 Mixed hyperlipidemia: Secondary | ICD-10-CM | POA: Diagnosis not present

## 2017-09-04 NOTE — Telephone Encounter (Signed)
09/04/17  Left patient a message about when her next appointment is scheduled.  That I was able to make her appointments and would give her a schedule at the next visit.

## 2017-09-04 NOTE — Therapy (Signed)
[place], [place], [address] Phone: [phone]   Fax:  [phone]  Occupational Therapy Evaluation  Patient Details  Name: [name] MRN: [id number] Date of Birth: [birth year] Referring Provider: Dr. Caren Macadam   Encounter Date: 09/03/2017  OT End of Session - 09/03/17 1733    Visit Number  1    Number of Visits  4    Date for OT Re-Evaluation  10/03/17    Authorization Type  1) Medicare A & B 2) AARP    OT Start Time  1358    OT Stop Time  1430    OT Time Calculation (min)  32 min    Activity Tolerance  Patient tolerated treatment well    Behavior During Therapy  [facility] for tasks assessed/performed       Past Medical History:  Diagnosis Date  . Anxiety   . Asthma   . Chronic back pain   . Colonoscopy causing post-procedural bleeding    Incomplete. by Dr. [name] 06/11/99 due to fixed non-compliant colon precluded exam to 40 cm, she had subsequent colectomy for diverticulitis since that time.   . Diarrhea   . Esophageal reflux   . Essential hypertension, benign   . Fibromyalgia   . Hiatal hernia    Recent EGD/TCS showed small hiatal hernia, normal apperaring tubular esophagus. s/p passage of a 54-french Maloney dilator, s/p biopsy esophageal mucosa, multiple fundal gland type gastric polyps. one large pedunculated polp with oozing, noted s/p clipping and snare polypectomy. Biopsies of the gastric mucosa taken given her symptoms in her elevated count. normal D1-D3, s/p biospy D2-D3.   [name] Hiatal hernia    all bx unremarkable. on TCS she had left-sided diverticula, evidence of prior segmental resection with anastomosis, multiple colonic polyps s/p multiple snare polypectomies, s/p segmental biopsy and stool sampling. she had multiple tubular adenomas. no microscopic/collagenous colitis. stool culture, c diff, O+P, lactoferrin were negative.   . Mixed hyperlipidemia   . Nephrolithiasis   . OSA (obstructive  sleep apnea)   . Permanent atrial fibrillation (HCC)    Previous failed DCCV  . Type 2 diabetes mellitus ([place])   . Ureteral stent retained    Not retained. ureteral stent removal gross hematuria resolved 10/11. coumadin restarted.   . Ventral hernia     Past Surgical History:  Procedure Laterality Date  . BREAST LUMPECTOMY     benign cyst  . CATARACT EXTRACTION W/PHACO Left 08/17/2012   Procedure: CATARACT EXTRACTION PHACO AND INTRAOCULAR LENS PLACEMENT (IOC);  Surgeon: [name], MD;  Location: [facility];  Service: Ophthalmology;  Laterality: Left;  CDE:18.73  . COLECTOMY     2005. Dr. Geoffry Paradise for diverticulitis  . COLONOSCOPY  02/22/09   normal/left-sided diverticula/multiple colonic polyp, adenomatous  . COLONOSCOPY  12/16/2011   colonic polyps-treated as described above. Status postprior sigmoid resection. adenomatous. next TCS 12/2014  . COLONOSCOPY N/A 12/28/2014   Status post prior segmental resection. Few residual colonic diverticula- status post segmental biopsy negative random colon biopsies  . ESOPHAGEAL DILATION N/A 12/28/2014   Procedure: ESOPHAGEAL DILATION;  Surgeon: [name], MD;  Location: [facility];  Service: Endoscopy;  Laterality: N/A;  . ESOPHAGOGASTRODUODENOSCOPY  02/22/09   normal s/p dilator;small HH/one large pedunculates polyp  s/p clipping, hyperplastic  . ESOPHAGOGASTRODUODENOSCOPY  12/16/2011   [name]: small hiatal hernia. Hyperplastic apperating polyps. Status post [name] dilation as described above.  . ESOPHAGOGASTRODUODENOSCOPY N/A 12/28/2014   RMR: Normal-appearing esophagus  status post passage of a maloney dilator. Hiatal hernia. abnormal gastic mucosa of uncertain significance status post gastric biopsy with mild chronic gastritis, no H pylori.   . TONSILLECTOMY    . TOTAL ABDOMINAL HYSTERECTOMY      There were no vitals filed for this visit.  Subjective Assessment - 09/03/17 1554    Subjective   S: I'd like to be able to do for myself again.      Patient is accompained by:  Family member son-Jimmy    Pertinent History  Pt is an 83 y/o female s/p SDH sustained after a fall down 21 stairs while visiting sister in [place]. Pt underwent a right fronto-temporoparietal craniotomy to decompress on 07/26/17. Pt also sustained a C2 cervical fracture with ligament injuries, [place] brace applied and pt reports she is to wear 24/7 for 8-12 weeks, of which she is 6 weeks out. Of note pt has not been established with neurosurgeon to follow up with in [place]. Pt received IP rehab at [facility] from 5/1-5/17. Pt was referred to occupational therapy for evaluation and treatment by Dr. Agapito Games.     Patient Stated Goals  To be more independent.     Currently in Pain?  Yes    Pain Score  9     Pain Location  Head    Pain Orientation  -- all around head    Pain Descriptors / Indicators  Aching;Sharp    Pain Type  Acute pain    Pain Radiating Towards  n/a    Pain Onset  More than a month ago    Pain Frequency  Constant    Aggravating Factors   becoming upset    Pain Relieving Factors  pain medication    Effect of Pain on Daily Activities  max effect on ADL completion    Multiple Pain Sites  No        [facility] OT Assessment - 09/03/17 1409      Assessment   Medical Diagnosis  s/p SDH    Referring Provider  Dr. Agapito Games    Onset Date/Surgical Date  07/26/17    Hand Dominance  Left    Next MD Visit  09/17/17 with Dr. [name]. Not currently being followed by neurosurgery    Prior Therapy  IP rehab from 5/1-5/17 at [facility] in [place], [place].       Precautions   Precautions  Fall;Cervical    Precaution Comments  C-Collar to wear 24/7 per pt (has another collar for bathing). Per pt not to lift arms above shoulder level   Required Braces or Orthoses  Cervical Brace    Cervical Brace  Hard collar;At all times [place] collar      Restrictions   Weight Bearing Restrictions  No      Balance Screen   Has the patient fallen in the past 6 months   Yes    How many times?  1    Has the patient had a decrease in activity level because of a fear of falling?   Yes    Is the patient reluctant to leave their home because of a fear of falling?   Yes      Home  Environment   Family/patient expects to be discharged to:  Private residence    [place] 2 sons    Available Help at Discharge  Family 24/7    Type of [place]  Stairs 3    Home Layout  One level    Solicitor  Long-handled sponge    Home Engineer, manufacturing - 2 wheels;Cane - single point;Shower seat;Grab bars - tub/shower;Wheelchair - manual    Additional Comments  Pt's daughter comes 1x/week from [place] to assist with bathing    Lives With  Son 2 sons      Prior Function   Level of Independence  Independent    Vocation  Retired    Leisure  reading, IT sales professional, crochet/knitting      ADL   ADL comments  Pt is currently limited in ADL completion due to C2 fracture and subsequent deficts and precautions, as well as weakness and limited activity tolerance. Pt is able to complete grooming tasks with set-up, supervision to min assist for bathing and reaching back/LEs, mod assist for LB dressing, set-up to min assist for UB dressing. Pt also requires assistance for toilet transfers, mod I for hygiene.        Written Expression   Dominant Hand  Left      Vision - History   Baseline Vision  Wears glasses all the time    Visual History  Cataracts right eye; left eye macular degeneration    Patient Visual Report  Other (comment) glasses were bent during fall, not fitted correctly now      Cognition   Overall Cognitive Status  Within Functional Limits for tasks assessed      ROM / Strength   AROM / PROM / Strength  Strength      Strength   Overall Strength Comments  Assessed seated. Did not assess shoulder strength due to C2 fx precautions     Strength Assessment Site   Elbow;Hand    Right/Left Elbow  Right;Left    Right Elbow Flexion  5/5    Right Elbow Extension  5/5    Left Elbow Flexion  4+/5    Left Elbow Extension  4+/5    Right/Left hand  Right;Left    Right Hand Gross Grasp  Functional    Right Hand Grip (lbs)  32    Right Hand Lateral Pinch  20 lbs    Right Hand 3 Point Pinch  8 lbs    Left Hand Gross Grasp  Functional    Left Hand Grip (lbs)  25    Left Hand Lateral Pinch  5 lbs    Left Hand 3 Point Pinch  5 lbs                      OT Education - 09/03/17 1733    Education provided  Yes    Education Details  Educated on what OT is and discussed pt's personal goals for therapy    Person(s) Educated  Patient    Methods  Explanation    Comprehension  Verbalized understanding       OT Short Term Goals - 09/04/17 0749      OT SHORT TERM GOAL #1   Title  Pt will be provided with and educated on HEP to improve ability to participate in ADL tasks.     Time  4    Period  Weeks    Status  New    Target Date  10/04/17      OT SHORT TERM GOAL #2   Title  Pt will perform toilet transfers and hygiene tasks at supervision to modified  independent level with use of AE/DME as needed.     Time  4    Period  Weeks    Status  New      OT SHORT TERM GOAL #3   Title  Pt will improve BUE strength to 5/5 while maintaining cervical precautions to improve ability to use BUE to perform transfers and wheelchair mobility tasks.     Time  4    Period  Weeks    Status  New      OT SHORT TERM GOAL #4   Title  Pt will improve bilateral grip strength by 10# and left pinch strength by 3# to improve ability to wash dishes.     Time  4    Period  Weeks    Status  New      OT SHORT TERM GOAL #5   Title  Pt will perform LB dressing tasks with supervision to min guard assist using AE as needed to compensate for mobility limitations.     Time  4    Period  Weeks    Status  New               Plan - 09/04/17 0734    Clinical  Impression Statement  A: Pt is an 83 y/o female s/p SDH with craniotomy and evacuation on 07/26/17, also with C2 fracture and ligament injuries, sustained after a fall down 21 steps in [place], [place]. Pt has returned home, living with 2 sons, daughter comes in once per week from [place] to assist with bathing. Pt's son assists with ADLs including dressing, some grooming, meal preparation, as well as mobility tasks. Pt is not set-up with a neurosurgeon in [place] at this time, per pt report she is supposed to wear her C-Collar for 8-12 weeks 24/7 and is not supposed to raise her arms above shoulder level at this time. Pt demonstrates decreased strength in BUE, left (dominant) greater than right. Discussed pt's goals for therapy, as well as limitations until she sees a neurosurgeon and her precautions are lifted or reduced. Will begin therapy 1x/week with potential to increase to 2x/week at reassessment if pt precautions allow for greater participation in functional tasks.     Occupational Profile and client history currently impacting functional performance  Prior to fall and sx pt was independent with B/IADLs and mobility tasks. Pt would like to return to an independent level as she does not wish for her sons to have to continue to assist with her daily care.     Occupational performance deficits (Please refer to evaluation for details):  ADL's;IADL's;Rest and Sleep;Leisure;Social Participation    Rehab Potential  Good    OT Frequency  1x / week    OT Duration  4 weeks    OT Treatment/Interventions  Self-care/ADL training;Therapeutic exercise;Manual Therapy;Therapeutic activities;Cryotherapy;Electrical Stimulation;Visual/perceptual remediation/compensation;Moist Heat;Passive range of motion;Patient/family education    Plan  P: Pt will benefit from skilled OT services to improve participation in ADL tasks and functional mobility. Treatment plan: BUE strengthening, bilateral grip and pinch strengthening, ADL training,  AE/DME use and training.     Clinical Decision Making  Multiple treatment options, significant modification of task necessary    Consulted and Agree with Plan of Care  Patient       Patient will benefit from skilled therapeutic intervention in order to improve the following deficits and impairments:  Impaired vision/preception, Decreased knowledge of precautions, Impaired perceived functional ability, Decreased activity tolerance, Decreased knowledge of use  of DME, Decreased strength, Decreased coordination, Decreased safety awareness, Impaired UE functional use  Visit Diagnosis: S/P evacuation of subdural hematoma  Other symptoms and signs involving the musculoskeletal system  Muscle weakness (generalized)    Problem List Patient Active Problem List   Diagnosis Date Noted  . RLQ abdominal pain 08/15/2016  . Diabetic complication ([place]) 26/33/3545  . Mucosal abnormality of stomach   . Diverticulosis of colon without hemorrhage   . Dysphagia, pharyngoesophageal phase   . Esophageal dysphagia 12/02/2014  . LLQ pain 02/22/2014  . Abdominal pain, epigastric 02/22/2014  . Encounter for therapeutic drug monitoring 05/18/2013  . Adenomatous polyps 11/21/2011  . Long term (current) use of anticoagulants 07/03/2010  . Permanent atrial fibrillation ([place]) 08/04/2009  . RENAL DISEASE, CHRONIC, STAGE III 08/04/2009  . IBS 04/14/2009  . Diabetes mellitus with stage 3 chronic kidney disease ([place]) 02/17/2009  . ANXIETY 02/17/2009  . GERD 02/17/2009  . DIARRHEA, CHRONIC 02/17/2009  . Mixed hyperlipidemia 12/21/2008  . Essential hypertension, benign 12/21/2008   Guadelupe Sabin, OTR/L  [phone] 09/04/2017, 8:09 AM  [place], [place], [address] Phone: [phone]   Fax:  [phone]  Name: CHAYLA SHANDS MRN: [id number] Date of Birth: 1934/05/25

## 2017-09-04 NOTE — Therapy (Signed)
[place], [place], [address] Phone: [phone]   Fax:  [phone]  Physical Therapy Evaluation  Patient Details  Name: [name] MRN: [id number] Date of Birth: [birth year] Referring Provider: Dr. Caren Macadam   Encounter Date: 09/03/2017  PT End of Session - 09/03/17 1820    Visit Number  1    Number of Visits  17    Date for PT Re-Evaluation  10/31/17 Mini-reassessment: 10/01/17    Authorization Type  Primary: Medicare; Secondary: [employer]    Authorization Time Period  09/03/17 - 10/31/17    Authorization - Visit Number  1    Authorization - Number of Visits  10    PT Start Time  3329    PT Stop Time  1515    PT Time Calculation (min)  40 min    Equipment Utilized During Treatment  Gait belt    Activity Tolerance  Patient limited by pain;Patient tolerated treatment well;Patient limited by fatigue    Behavior During Therapy  [facility] for tasks assessed/performed       Past Medical History:  Diagnosis Date  . Anxiety   . Asthma   . Chronic back pain   . Colonoscopy causing post-procedural bleeding    Incomplete. by Dr. [name] 06/11/99 due to fixed non-compliant colon precluded exam to 40 cm, she had subsequent colectomy for diverticulitis since that time.   . Diarrhea   . Esophageal reflux   . Essential hypertension, benign   . Fibromyalgia   . Hiatal hernia    Recent EGD/TCS showed small hiatal hernia, normal apperaring tubular esophagus. s/p passage of a 54-french Maloney dilator, s/p biopsy esophageal mucosa, multiple fundal gland type gastric polyps. one large pedunculated polp with oozing, noted s/p clipping and snare polypectomy. Biopsies of the gastric mucosa taken given her symptoms in her elevated count. normal D1-D3, s/p biospy D2-D3.   [name] Hiatal hernia    all bx unremarkable. on TCS she had left-sided diverticula, evidence of prior segmental resection with anastomosis, multiple colonic polyps s/p  multiple snare polypectomies, s/p segmental biopsy and stool sampling. she had multiple tubular adenomas. no microscopic/collagenous colitis. stool culture, c diff, O+P, lactoferrin were negative.   . Mixed hyperlipidemia   . Nephrolithiasis   . OSA (obstructive sleep apnea)   . Permanent atrial fibrillation (HCC)    Previous failed DCCV  . Type 2 diabetes mellitus ([place])   . Ureteral stent retained    Not retained. ureteral stent removal gross hematuria resolved 10/11. coumadin restarted.   . Ventral hernia     Past Surgical History:  Procedure Laterality Date  . BREAST LUMPECTOMY     benign cyst  . CATARACT EXTRACTION W/PHACO Left 08/17/2012   Procedure: CATARACT EXTRACTION PHACO AND INTRAOCULAR LENS PLACEMENT (IOC);  Surgeon: [name], MD;  Location: [facility];  Service: Ophthalmology;  Laterality: Left;  CDE:18.73  . COLECTOMY     2005. Dr. Geoffry Paradise for diverticulitis  . COLONOSCOPY  02/22/09   normal/left-sided diverticula/multiple colonic polyp, adenomatous  . COLONOSCOPY  12/16/2011   colonic polyps-treated as described above. Status postprior sigmoid resection. adenomatous. next TCS 12/2014  . COLONOSCOPY N/A 12/28/2014   Status post prior segmental resection. Few residual colonic diverticula- status post segmental biopsy negative random colon biopsies  . ESOPHAGEAL DILATION N/A 12/28/2014   Procedure: ESOPHAGEAL DILATION;  Surgeon: [name], MD;  Location: [facility];  Service: Endoscopy;  Laterality: N/A;  . ESOPHAGOGASTRODUODENOSCOPY  02/22/09  normal s/p dilator;small HH/one large pedunculates polyp  s/p clipping, hyperplastic  . ESOPHAGOGASTRODUODENOSCOPY  12/16/2011   [name]: small hiatal hernia. Hyperplastic apperating polyps. Status post [name] dilation as described above.  . ESOPHAGOGASTRODUODENOSCOPY N/A 12/28/2014   RMR: Normal-appearing esophagus status post passage of a maloney dilator. Hiatal hernia. abnormal gastic mucosa of uncertain significance status post  gastric biopsy with mild chronic gastritis, no H pylori.   . TONSILLECTOMY    . TOTAL ABDOMINAL HYSTERECTOMY      There were no vitals filed for this visit.   Subjective Assessment - 09/03/17 1453    Subjective  Patient reported that on July 25, 2017 she fell down 21 steps and hit a concrete floor while visiting family in [place]. Patient stated that after that she did not remember what happened. She said she was at the hospital with a subdural hematoma (SDH) and that she underwent surgery to evacuate the SDH on 07/26/17 via right fronto-temporoparietal craniotomy. Patient reported that she has cervical fractures at C2 with ligament injuries and must where her hard neck brace at all times. She stated it is to be worn at all times for 8-12 weeks. Patient denied any changes with bowel function. Patient also denied any onset of new tingling or numbness, but reported a history of numbness in bilateral feet due to her diabetes. Patient reported that she was educated to bend from her hips and not her back. She stated that she was at [facility] from 08/06/17-08/22/17.    Patient is accompained by:  Family member Son in waiting room    Pertinent History  Fall down 21 steps, SDH and craniotomy to correct this on 07/26/17. C2 fractures. History of DMII and HTN.     Limitations  Walking;Standing;House hold activities    How long can you sit comfortably?  Not limited    How long can you stand comfortably?  Used to stand for a couple hours at a time. Couple minutes with support    How long can you walk comfortably?  20 feet    Patient Stated Goals  Get back to doing regular household activities    Currently in Pain?  Yes    Pain Score  9     Pain Location  Head    Pain Orientation  Other (Comment) All around head    Pain Descriptors / Indicators  Aching;Sharp    Pain Type  Acute pain    Pain Onset  More than a month ago    Pain Frequency  Constant    Aggravating Factors    Getting upset    Pain Relieving Factors  Pain medication    Effect of Pain on Daily Activities  Severely effects    Multiple Pain Sites  No         [facility] PT Assessment - 09/03/17 1459      Assessment   Medical Diagnosis  s/p SDH    Referring Provider  Dr. Apolonio Schneiders, [place]    Onset Date/Surgical Date  07/26/17 [place] down stairs on 07/25/17    Next MD Visit  09/17/17 with Dr. [name]. Not currently being followed by neurosurgery    Prior Therapy  IP rehab from 5/1-5/17 at [facility] in [place], [place].       Precautions   Precautions  Fall;Cervical    Precaution Comments  C-Collar to wear 24/7 (has another collar for bathing)    Required Braces or Orthoses  Cervical Brace    Cervical  Brace  Hard collar;At all times      Restrictions   Weight Bearing Restrictions  No      Balance Screen   Has the patient fallen in the past 6 months  Yes    How many times?  1    Has the patient had a decrease in activity level because of a fear of falling?   Yes    Is the patient reluctant to leave their home because of a fear of falling?   No      Home Environment   Living Environment  Private residence    Living Arrangements  Children 2 sons live with her    Type of [place] to enter    Entrance Stairs-Number of Steps  3    Entrance Stairs-Rails  Can reach both    [place]  One level    [place] - 2 wheels;Cane - single point;Wheelchair - manual;Shower seat - built in;Grab bars - toilet;Grab bars - tub/shower      Prior Function   Level of Independence  Independent;Independent with basic ADLs    Vocation  Retired    Leisure  reading, IT sales professional, Education administrator   Overall Cognitive Status  Impaired/Different from baseline      Observation/Other Assessments   Focus on Therapeutic Outcomes (FOTO)   23% (77% limited)      Sensation   Light Touch  Not tested Patient reported a pre-existing foot numbness due to  DM      Functional Tests   Functional tests  Sit to Stand      Sit to Stand   Comments  Patient performed sit to stand from wheelchair with increased time and supervision assistance      Posture/Postural Control   Posture/Postural Control  Postural limitations    Postural Limitations  Rounded Shoulders      Tone   Assessment Location  Right Lower Extremity;Left Lower Extremity      ROM / Strength   AROM / PROM / Strength  Strength      Strength   Overall Strength Comments  All lower extremity strength testing performed in sitting as patient reported she was unable to lay down    Right/Left Hip  Right;Left    Right Hip Flexion  4/5    Right Hip Extension  -- unable to test    Right Hip ABduction  -- Patient able to resist max resisistance in sitting    Right Hip ADduction  2+/5 Patient able to resist moderate resisistance in sitting    Left Hip Flexion  4/5    Left Hip Extension  -- unable to test    Left Hip ABduction  -- Patient able to resist max resisistance in sitting    Left Hip ADduction  2+/5 Patient able to resist moderate resisistance in sitting    Right/Left Knee  Right;Left    Right Knee Flexion  4+/5    Right Knee Extension  4+/5    Left Knee Flexion  4+/5    Left Knee Extension  4+/5    Right/Left Ankle  Right;Left    Right Ankle Dorsiflexion  4+/5    Right Ankle Plantar Flexion  -- Able to withstand maximal resistance    Left Ankle Dorsiflexion  4+/5    Left Ankle Plantar Flexion  -- Able to withstand maximal resistance  Bed Mobility   Bed Mobility  -- Patient reported that she is unable to lay down      Transfers   Transfers  Sit to Stand    Sit to Stand  5: Supervision    Comments  Patient performed sit to stand from wheelchair with supervision assistance and increased time. Unable to perform further transfers due to patient reported dizziness.       Ambulation/Gait   Gait Comments  Not assessed this session      Static Standing Balance   Static  Standing Balance -  Activities   Single Leg Stance - Right Leg;Single Leg Stance - Left Leg    Static Standing - Comment/# of Minutes  Unable to attempt single limb stance bilaterally      RLE Tone   RLE Tone  Within Functional Limits      LLE Tone   LLE Tone  Within Functional Limits                Objective measurements completed on examination: See above findings.              PT Education - 09/03/17 1538    Education Details  Patient was educated on examination findings as well as plan of care.     Person(s) Educated  Patient;Child(ren)    Methods  Explanation    Comprehension  Verbalized understanding       PT Short Term Goals - 09/03/17 1541      PT SHORT TERM GOAL #1   Title  Patient will demonstrate understanding and report regular compliance with HEP in order to improve functional mobility and return to prior level of function.     Time  4    Period  Weeks    Status  New    Target Date  09/29/17      PT SHORT TERM GOAL #2   Title  Patient will demonstrate ability to perform stand pivot transfer with no more than supervision assistance.     Time  4    Period  Weeks    Status  New    Target Date  10/01/17      PT SHORT TERM GOAL #3   Title  Patient will demonstrate ability to ambulate 80 feet with least retrictive assistive device and no more than supervision assistance in order to perform household ambulation with improved safety.     Time  4    Period  Weeks    Status  New    Target Date  10/01/17        PT Long Term Goals - 09/03/17 1547      PT LONG TERM GOAL #1   Title  Patient will demonstrate ability to ambulate 180 feet with least retrictive assistive device and no more than supervision assistance in order to perform household ambulation with improved safety at a velocity of 0.8 m/s.     Time  8    Period  Weeks    Status  New    Target Date  10/29/17      PT LONG TERM GOAL #2   Title  Patient will report ability to stand for  20 minutes in order to prepare a meal.     Time  8    Period  Weeks    Status  New    Target Date  10/29/17      PT LONG TERM GOAL #3   Title  If cleared with  spinal precautions, patient will demonstrate ability to perform bed mobility including rolling and supine to sit with no more than supervision assistance.     Time  8    Period  Weeks    Status  New    Target Date  10/29/17             Plan - 09/03/17 1733    Clinical Impression Statement  Patient is an 83 year-old female who presented to physical therapy following a subdural hematoma with subsequent evacuation on 07/26/17 as well as a C2 fracture and ligament injury from when she fell down 21 steps while visiting family in [place]. Upon examination patient demonstrated decreased lower extremity strength, decreased activity tolerance, decreased balance with patient unable to attempt single limb stance, decreased ability to perform transfers independently, decreased ability to ambulate independently and overall decreased functional status from prior level of function. In addition, patient reported pain this session and some dizziness. Patient would benefit from skilled physical therapy in order to address patient's abovementioned deficits in order to help her return to her prior level of function.     History and Personal Factors relevant to plan of care:  Fall down 21 steps, SDH and craniotomy to correct this on 07/26/17. C2 fractures. History of DMII and HTN.     Clinical Presentation  Stable    Clinical Presentation due to:  FOTO, MMT, clinical judgement    Clinical Decision Making  Moderate    Rehab Potential  Fair    Clinical Impairments Affecting Rehab Potential  Positive: family support, motivated. Negative: Complexity of medical conditions    PT Frequency  2x / week    PT Duration  8 weeks    PT Treatment/Interventions  ADLs/Self Care Home Management;DME Instruction;Gait training;Stair training;Functional mobility  training;Therapeutic activities;Therapeutic exercise;Balance training;Neuromuscular re-education;Patient/family education;Orthotic Fit/Training;Wheelchair mobility training;Manual techniques;Passive range of motion;Energy conservation    PT Next Visit Plan  Review evaluation and goals. Initiated HEP including lower extremity seated strengthening and include instruction on proper and safe transfers. Initiate gait training if patient is able    PT Home Exercise Plan  Initiate first treatment    Consulted and Agree with Plan of Care  Patient;Family member/caregiver    Family Member Consulted  Patient's son       Patient will benefit from skilled therapeutic intervention in order to improve the following deficits and impairments:  Abnormal gait, Decreased balance, Decreased endurance, Decreased mobility, Difficulty walking, Dizziness, Improper body mechanics, Obesity, Decreased activity tolerance, Decreased knowledge of use of DME, Decreased strength, Postural dysfunction, Pain  Visit Diagnosis: S/P evacuation of subdural hematoma  Muscle weakness (generalized)  Other symptoms and signs involving the musculoskeletal system     Problem List Patient Active Problem List   Diagnosis Date Noted  . RLQ abdominal pain 08/15/2016  . Diabetic complication ([place]) 03/50/0938  . Mucosal abnormality of stomach   . Diverticulosis of colon without hemorrhage   . Dysphagia, pharyngoesophageal phase   . Esophageal dysphagia 12/02/2014  . LLQ pain 02/22/2014  . Abdominal pain, epigastric 02/22/2014  . Encounter for therapeutic drug monitoring 05/18/2013  . Adenomatous polyps 11/21/2011  . Long term (current) use of anticoagulants 07/03/2010  . Permanent atrial fibrillation ([place]) 08/04/2009  . RENAL DISEASE, CHRONIC, STAGE III 08/04/2009  . IBS 04/14/2009  . Diabetes mellitus with stage 3 chronic kidney disease ([place]) 02/17/2009  . ANXIETY 02/17/2009  . GERD 02/17/2009  . DIARRHEA, CHRONIC 02/17/2009   . Mixed hyperlipidemia 12/21/2008  .  Essential hypertension, benign 12/21/2008   Clarene Critchley PT, DPT 5:40 PM, 09/04/17 [place], [place], [address] Phone: [phone]   Fax:  [phone]  Name: [name] MRN: [id number] Date of Birth: 1934-09-22

## 2017-09-05 LAB — LIPID PANEL
Cholesterol: 123 mg/dL (ref <200)
HDL: 33 mg/dL — AB (ref >50)
LDL Cholesterol (Calc): 70 mg/dL (calc)
NON-HDL CHOLESTEROL (CALC): 90 mg/dL (ref <130)
Total CHOL/HDL Ratio: 3.7 (calc) (ref <5.0)
Triglycerides: 116 mg/dL (ref <150)

## 2017-09-05 LAB — COMPLETE METABOLIC PANEL WITH GFR
AG Ratio: 1.3 (calc) (ref 1.0–2.5)
ALT: 8 U/L (ref 6–29)
AST: 11 U/L (ref 10–35)
Albumin: 3.8 g/dL (ref 3.6–5.1)
Alkaline phosphatase (APISO): 94 U/L (ref 33–130)
BUN/Creatinine Ratio: 16 (calc) (ref 6–22)
BUN: 17 mg/dL (ref 7–25)
CALCIUM: 9.5 mg/dL (ref 8.6–10.4)
CO2: 26 mmol/L (ref 20–32)
CREATININE: 1.07 mg/dL — AB (ref 0.60–0.88)
Chloride: 103 mmol/L (ref 98–110)
GFR, Est African American: 57 mL/min/{1.73_m2} — ABNORMAL LOW (ref >=60)
GFR, Est Non African American: 49 mL/min/{1.73_m2} — ABNORMAL LOW (ref >=60)
Globulin: 3 g/dL (calc) (ref 1.9–3.7)
Glucose, Bld: 137 mg/dL — ABNORMAL HIGH (ref 65–99)
POTASSIUM: 4.3 mmol/L (ref 3.5–5.3)
Sodium: 139 mmol/L (ref 135–146)
Total Bilirubin: 0.4 mg/dL (ref 0.2–1.2)
Total Protein: 6.8 g/dL (ref 6.1–8.1)

## 2017-09-05 LAB — MICROALBUMIN / CREATININE URINE RATIO
CREATININE, URINE: 71 mg/dL (ref 20–275)
Microalb Creat Ratio: 880 mcg/mg creat — ABNORMAL HIGH (ref <30)
Microalb, Ur: 62.5 mg/dL

## 2017-09-05 LAB — HEMOGLOBIN A1C
EAG (MMOL/L): 9.3 (calc)
HEMOGLOBIN A1C: 7.5 %{Hb} — AB (ref <5.7)
MEAN PLASMA GLUCOSE: 169 (calc)

## 2017-09-05 LAB — TSH: TSH: 2.47 mIU/L (ref 0.40–4.50)

## 2017-09-05 LAB — T4, FREE: Free T4: 1.4 ng/dL (ref 0.8–1.8)

## 2017-09-09 ENCOUNTER — Ambulatory Visit (INDEPENDENT_AMBULATORY_CARE_PROVIDER_SITE_OTHER): Payer: Medicare Other | Admitting: "Endocrinology

## 2017-09-09 ENCOUNTER — Encounter: Payer: Self-pay | Admitting: "Endocrinology

## 2017-09-09 VITALS — BP 148/77 | HR 79 | Ht 63.0 in

## 2017-09-09 DIAGNOSIS — E782 Mixed hyperlipidemia: Secondary | ICD-10-CM

## 2017-09-09 DIAGNOSIS — N183 Chronic kidney disease, stage 3 unspecified: Secondary | ICD-10-CM

## 2017-09-09 DIAGNOSIS — I10 Essential (primary) hypertension: Secondary | ICD-10-CM | POA: Diagnosis not present

## 2017-09-09 DIAGNOSIS — E1122 Type 2 diabetes mellitus with diabetic chronic kidney disease: Secondary | ICD-10-CM | POA: Diagnosis not present

## 2017-09-09 DIAGNOSIS — I639 Cerebral infarction, unspecified: Secondary | ICD-10-CM | POA: Diagnosis not present

## 2017-09-09 MED ORDER — INSULIN GLARGINE 100 UNIT/ML SOLOSTAR PEN: PEN_INJECTOR | SUBCUTANEOUS | 2 refills | Status: DC

## 2017-09-09 NOTE — Patient Instructions (Signed)
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
## 2017-09-10 ENCOUNTER — Encounter (HOSPITAL_COMMUNITY): Payer: Self-pay

## 2017-09-10 ENCOUNTER — Ambulatory Visit (HOSPITAL_COMMUNITY): Payer: Medicare Other

## 2017-09-10 ENCOUNTER — Other Ambulatory Visit: Payer: Self-pay

## 2017-09-10 ENCOUNTER — Encounter (HOSPITAL_COMMUNITY): Payer: Self-pay | Admitting: Speech Pathology

## 2017-09-10 ENCOUNTER — Encounter: Payer: Self-pay | Admitting: Family Medicine

## 2017-09-10 ENCOUNTER — Ambulatory Visit (HOSPITAL_COMMUNITY): Payer: Medicare Other | Admitting: Occupational Therapy

## 2017-09-10 ENCOUNTER — Ambulatory Visit (HOSPITAL_COMMUNITY): Payer: Medicare Other | Attending: Family Medicine | Admitting: Speech Pathology

## 2017-09-10 ENCOUNTER — Ambulatory Visit (INDEPENDENT_AMBULATORY_CARE_PROVIDER_SITE_OTHER): Payer: Medicare Other | Admitting: Family Medicine

## 2017-09-10 ENCOUNTER — Encounter (HOSPITAL_COMMUNITY): Payer: Self-pay | Admitting: Occupational Therapy

## 2017-09-10 VITALS — BP 140/70 | HR 79 | Temp 98.7°F | Resp 14 | Ht 63.0 in | Wt 212.0 lb

## 2017-09-10 DIAGNOSIS — Z8679 Personal history of other diseases of the circulatory system: Secondary | ICD-10-CM | POA: Diagnosis not present

## 2017-09-10 DIAGNOSIS — R4789 Other speech disturbances: Secondary | ICD-10-CM

## 2017-09-10 DIAGNOSIS — R29898 Other symptoms and signs involving the musculoskeletal system: Secondary | ICD-10-CM

## 2017-09-10 DIAGNOSIS — R41841 Cognitive communication deficit: Secondary | ICD-10-CM | POA: Diagnosis not present

## 2017-09-10 DIAGNOSIS — M6281 Muscle weakness (generalized): Secondary | ICD-10-CM | POA: Diagnosis not present

## 2017-09-10 DIAGNOSIS — Z8781 Personal history of (healed) traumatic fracture: Secondary | ICD-10-CM

## 2017-09-10 DIAGNOSIS — Z9889 Other specified postprocedural states: Secondary | ICD-10-CM | POA: Diagnosis not present

## 2017-09-10 DIAGNOSIS — R531 Weakness: Secondary | ICD-10-CM | POA: Diagnosis not present

## 2017-09-10 NOTE — Therapy (Signed)
[name], [place], [address] Phone: [phone]   Fax:  [phone]  Speech Language Pathology Treatment  Patient Details  Name: [name] MRN: [id number] Date of Birth: [birth year] Referring Provider: Caren Macadam, MD   Encounter Date: 09/10/2017  End of Session - 09/10/17 1056    Visit Number  2    Number of Visits  4    Date for SLP Re-Evaluation  10/09/17    Authorization Type  Medicare and [employer] secondary    SLP Start Time  0945    SLP Stop Time   1030    SLP Time Calculation (min)  45 min    Activity Tolerance  Patient tolerated treatment well       Past Medical History:  Diagnosis Date  . Anxiety   . Asthma   . Chronic back pain   . Colonoscopy causing post-procedural bleeding    Incomplete. by Dr. Gala [name] 06/11/99 due to fixed non-compliant colon precluded exam to 40 cm, she had subsequent colectomy for diverticulitis since that time.   . Diarrhea   . Esophageal reflux   . Essential hypertension, benign   . Fibromyalgia   . Hiatal hernia    Recent EGD/TCS showed small hiatal hernia, normal apperaring tubular esophagus. s/p passage of a 54-french Maloney dilator, s/p biopsy esophageal mucosa, multiple fundal gland type gastric polyps. one large pedunculated polp with oozing, noted s/p clipping and snare polypectomy. Biopsies of the gastric mucosa taken given her symptoms in her elevated count. normal D1-D3, s/p biospy D2-D3.   [name] Hiatal hernia    all bx unremarkable. on TCS she had left-sided diverticula, evidence of prior segmental resection with anastomosis, multiple colonic polyps s/p multiple snare polypectomies, s/p segmental biopsy and stool sampling. she had multiple tubular adenomas. no microscopic/collagenous colitis. stool culture, c diff, O+P, lactoferrin were negative.   . Mixed hyperlipidemia   . Nephrolithiasis   . OSA (obstructive sleep apnea)   . Permanent atrial fibrillation (HCC)     Previous failed DCCV  . Type 2 diabetes mellitus ([place])   . Ureteral stent retained    Not retained. ureteral stent removal gross hematuria resolved 10/11. coumadin restarted.   . Ventral hernia     Past Surgical History:  Procedure Laterality Date  . BREAST LUMPECTOMY     benign cyst  . CATARACT EXTRACTION W/PHACO Left 08/17/2012   Procedure: CATARACT EXTRACTION PHACO AND INTRAOCULAR LENS PLACEMENT (IOC);  Surgeon: [name] Branch, MD;  Location: [facility];  Service: Ophthalmology;  Laterality: Left;  CDE:18.73  . COLECTOMY     2005. Dr. Geoffry Paradise for diverticulitis  . COLONOSCOPY  02/22/09   normal/left-sided diverticula/multiple colonic polyp, adenomatous  . COLONOSCOPY  12/16/2011   colonic polyps-treated as described above. Status postprior sigmoid resection. adenomatous. next TCS 12/2014  . COLONOSCOPY N/A 12/28/2014   Status post prior segmental resection. Few residual colonic diverticula- status post segmental biopsy negative random colon biopsies  . ESOPHAGEAL DILATION N/A 12/28/2014   Procedure: ESOPHAGEAL DILATION;  Surgeon: [name] Dolin, MD;  Location: [facility];  Service: Endoscopy;  Laterality: N/A;  . ESOPHAGOGASTRODUODENOSCOPY  02/22/09   normal s/p dilator;small HH/one large pedunculates polyp  s/p clipping, hyperplastic  . ESOPHAGOGASTRODUODENOSCOPY  12/16/2011   [name]: small hiatal hernia. Hyperplastic apperating polyps. Status post [name] dilation as described above.  . ESOPHAGOGASTRODUODENOSCOPY N/A 12/28/2014   RMR: Normal-appearing esophagus status post passage of a maloney dilator. Hiatal hernia. abnormal gastic mucosa of  uncertain significance status post gastric biopsy with mild chronic gastritis, no H pylori.   . TONSILLECTOMY    . TOTAL ABDOMINAL HYSTERECTOMY      There were no vitals filed for this visit.  Subjective Assessment - 09/10/17 1040    Subjective  "I need help keeping my thoughts together."    Currently in Pain?  No/denies      ADULT SLP  TREATMENT - 09/10/17 1041      General Information   Behavior/Cognition  Alert;Cooperative;Pleasant mood    Patient Positioning  Upright in chair    Oral care provided  N/A    HPI  Pt is an 83 y/o female s/p SDH sustained after a fall down 21 stairs while visiting sister in [place]. Pt underwent a right fronto-temporoparietal craniotomy to decompress on 07/26/17. Pt also sustained a C2 cervical fracture with ligament injuries, [place] brace applied and pt reports she is to wear 24/7 for 8-12 weeks, of which she is 6 weeks out. Of note pt has not been established with neurosurgeon to follow up with in [place]. Pt received IP rehab at [facility] from 5/1-5/17. Pt was referred to speech pathology for evaluation and treatment by Dr. Agapito Games.      Treatment Provided   Treatment provided  Cognitive-Linquistic      Pain Assessment   Pain Assessment  No/denies pain      Cognitive-Linquistic Treatment   Treatment focused on  Cognition;Patient/family/caregiver education;Aphasia    Skilled Treatment  SLP created memory/organization folder and included schedule, medical treatment team, calendar, wording finding, and memory strategies. SLP encouraged Pt to review the folder daily to help with improved memory for daily activities and routines.       Assessment / Recommendations / Plan   Plan  Continue with current plan of care      Progression Toward Goals   Progression toward goals  Progressing toward goals       SLP Education - 09/10/17 1055    Education Details  Provided Pt with folder for memory and organization    Person(s) Educated  Patient    Methods  Explanation;Demonstration;Handout    Comprehension  Verbalized understanding       SLP Short Term Goals - 09/10/17 1057      SLP SHORT TERM GOAL #1   Title  Pt will implement memory strategies in functional therapy activities with 90% acc with min cues.    Baseline  60%    Time  4    Period  Weeks    Status  On-going      SLP SHORT  TERM GOAL #2   Title  Pt will complete selective and alternating attention tasks (moderately complex) with 90% acc and min cues.    Baseline  min/mod cues and extra time to complete    Time  4    Period  Weeks    Status  On-going      SLP SHORT TERM GOAL #3   Title  Pt will implement word-finding strategies with 90% accuracy when unable to verbalize desired word in conversation/functional tasks with min assist.    Baseline  Pt with circumlocutions at times in conversation    Time  4    Period  Weeks    Status  On-going       SLP Long Term Goals - 09/10/17 1057      SLP LONG TERM GOAL #1   Title  Same as short term goals above  Plan - 09/10/17 1057    Clinical Impression Statement Pt attended SLP therapy unattended this date. The evaluation from last week and goals were reviewed with Pt. As stated above, Pt was given a folder to help keep her organized in relation to treatment goals with a focus on improving memory, attention, and word finding skills. Pt required cues to propel her wheelchair, use hand gel, move to table, and place brakes on chair. She stated that her sons "do everything for" her at home. SLP encouraged Pt to start trying to do increase independence with appropriate tasks at home. Pt able to identify and located items in her folder today, however she had difficulty reading some of the print due to not having her glasses this date. Continue plan of care with a focus on going into further detail with memory and word finding strategies next session.    Speech Therapy Frequency  1x /week    Duration  4 weeks    Treatment/Interventions  Cognitive reorganization;SLP instruction and feedback;Internal/external aids;Compensatory strategies;Compensatory techniques;Patient/family education;Functional tasks;Cueing hierarchy    Potential to Achieve Goals  Good    Potential Considerations  Financial resources ? vision changes vs. damaged glasses for reading    SLP Home Exercise  Plan  Pt will complete HEP as assigned to facilitate carryover of treatment strategies and techniques in home environment with assist from family.    Consulted and Agree with Plan of Care  Patient;Family member/caregiver    Family Member Consulted  Son, Jeneen Rinks       Patient will benefit from skilled therapeutic intervention in order to improve the following deficits and impairments:   Cognitive communication deficit    Problem List Patient Active Problem List   Diagnosis Date Noted  . RLQ abdominal pain 08/15/2016  . Diabetic complication ([place]) 15/40/0867  . Mucosal abnormality of stomach   . Diverticulosis of colon without hemorrhage   . Dysphagia, pharyngoesophageal phase   . Esophageal dysphagia 12/02/2014  . LLQ pain 02/22/2014  . Abdominal pain, epigastric 02/22/2014  . Encounter for therapeutic drug monitoring 05/18/2013  . Adenomatous polyps 11/21/2011  . Long term (current) use of anticoagulants 07/03/2010  . Permanent atrial fibrillation ([place]) 08/04/2009  . RENAL DISEASE, CHRONIC, STAGE III 08/04/2009  . IBS 04/14/2009  . Diabetes mellitus with stage 3 chronic kidney disease ([place]) 02/17/2009  . ANXIETY 02/17/2009  . GERD 02/17/2009  . DIARRHEA, CHRONIC 02/17/2009  . Mixed hyperlipidemia 12/21/2008  . Essential hypertension, benign 12/21/2008   Thank you,  Genene Churn, [place]  [facility] 09/10/2017, 10:58 AM  [facility] [address] [place], [place], [address] Phone: [phone]   Fax:  [phone]   Name: NYKIAH MA MRN: [id number] Date of Birth: 06/22/1934

## 2017-09-10 NOTE — Patient Instructions (Signed)
Home Exercises Program Theraputty Exercises  Do the following exercises 2 times a day using your affected hand.  1. Roll putty into a ball.  2. Make into a pancake.  3. Roll putty into a roll.  4. Pinch along log with first finger and thumb.   5. Make into a ball.  6. Roll it back into a log.   7. Pinch using thumb and side of first finger.  8. Roll into a ball, then flatten into a pancake.  9. Using your fingers, make putty into a mountain.   
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
## 2017-09-10 NOTE — Patient Instructions (Addendum)
Bag balm Desitin  Need copy of scans from the hospital to take to the neurosurgery

## 2017-09-10 NOTE — Therapy (Signed)
[place], [place], [address] Phone: [phone]   Fax:  [phone]  Occupational Therapy Treatment  Patient Details  Name: [name] MRN: [id number] Date of Birth: February 22, 1935 Referring Provider: Dr. Caren Macadam   Encounter Date: 09/10/2017  OT End of Session - 09/10/17 1209    Visit Number  2    Number of Visits  4    Date for OT Re-Evaluation  10/03/17    Authorization Type  1) Medicare A & B 2) AARP    OT Start Time  1032    OT Stop Time  1114    OT Time Calculation (min)  42 min    Activity Tolerance  Patient tolerated treatment well    Behavior During Therapy  [facility] for tasks assessed/performed       Past Medical History:  Diagnosis Date  . Anxiety   . Asthma   . Chronic back pain   . Colonoscopy causing post-procedural bleeding    Incomplete. by Dr. [name] 06/11/99 due to fixed non-compliant colon precluded exam to 40 cm, she had subsequent colectomy for diverticulitis since that time.   . Diarrhea   . Esophageal reflux   . Essential hypertension, benign   . Fibromyalgia   . Hiatal hernia    Recent EGD/TCS showed small hiatal hernia, normal apperaring tubular esophagus. s/p passage of a 54-french Maloney dilator, s/p biopsy esophageal mucosa, multiple fundal gland type gastric polyps. one large pedunculated polp with oozing, noted s/p clipping and snare polypectomy. Biopsies of the gastric mucosa taken given her symptoms in her elevated count. normal D1-D3, s/p biospy D2-D3.   [name] Hiatal hernia    all bx unremarkable. on TCS she had left-sided diverticula, evidence of prior segmental resection with anastomosis, multiple colonic polyps s/p multiple snare polypectomies, s/p segmental biopsy and stool sampling. she had multiple tubular adenomas. no microscopic/collagenous colitis. stool culture, c diff, O+P, lactoferrin were negative.   . Mixed hyperlipidemia   . Nephrolithiasis   . OSA (obstructive  sleep apnea)   . Permanent atrial fibrillation (HCC)    Previous failed DCCV  . Type 2 diabetes mellitus ([place])   . Ureteral stent retained    Not retained. ureteral stent removal gross hematuria resolved 10/11. coumadin restarted.   . Ventral hernia     Past Surgical History:  Procedure Laterality Date  . BREAST LUMPECTOMY     benign cyst  . CATARACT EXTRACTION W/PHACO Left 08/17/2012   Procedure: CATARACT EXTRACTION PHACO AND INTRAOCULAR LENS PLACEMENT (IOC);  Surgeon: [name], MD;  Location: [facility] OR[facility];  Service: Ophthalmology;  Laterality: Left;  CDE:18.73  . COLECTOMY     2005. Dr. Geoffry Paradise for diverticulitis  . COLONOSCOPY  02/22/09   normal/left-sided diverticula/multiple colonic polyp, adenomatous  . COLONOSCOPY  12/16/2011   colonic polyps-treated as described above. Status postprior sigmoid resection. adenomatous. next TCS 12/2014  . COLONOSCOPY N/A 12/28/2014   Status post prior segmental resection. Few residual colonic diverticula- status post segmental biopsy negative random colon biopsies  . ESOPHAGEAL DILATION N/A 12/28/2014   Procedure: ESOPHAGEAL DILATION;  Surgeon: [name], MD;  Location: [facility] SU[facility];  Service: Endoscopy;  Laterality: N/A;  . ESOPHAGOGASTRODUODENOSCOPY  02/22/09   normal s/p dilator;small HH/one large pedunculates polyp  s/p clipping, hyperplastic  . ESOPHAGOGASTRODUODENOSCOPY  12/16/2011   [name]: small hiatal hernia. Hyperplastic apperating polyps. Status post [name] dilation as described above.  . ESOPHAGOGASTRODUODENOSCOPY N/A 12/28/2014   RMR: Normal-appearing esophagus  status post passage of a maloney dilator. Hiatal hernia. abnormal gastic mucosa of uncertain significance status post gastric biopsy with mild chronic gastritis, no H pylori.   . TONSILLECTOMY    . TOTAL ABDOMINAL HYSTERECTOMY      There were no vitals filed for this visit.  Subjective Assessment - 09/10/17 1031    Subjective   S: I took a half of a pain pill this morning  so I feel good right now.     Currently in Pain?  No/denies         [facility] OT Assessment - 09/10/17 1031      Assessment   Medical Diagnosis  s/p SDH      Precautions   Precautions  Fall;Cervical    Precaution Comments  C-Collar to wear 24/7 (has another collar for bathing)    Required Braces or Orthoses  Cervical Brace    Cervical Brace  Hard collar;At all times      Restrictions   Weight Bearing Restrictions  No               OT Treatments/Exercises (OP) - 09/10/17 1036      Exercises   Exercises  Hand;Theraputty      Hand Exercises   MCPJ Flexion  AROM;10 reps    MCPJ Extension  AROM;10 reps    Hand Gripper with Large Beads  left: all beads gripper at 25#; right: all beads gripper at 25#    Hand Gripper with Medium Beads  left: all beads gripper at 25#; right: all beads gripper at 25#    Other Hand Exercises  thumb flexion with red rubberband, 10X    Other Hand Exercises  Pt placed all colors of clothespins along horizontal bars with right hand, removing with left hand, using 3 point pinch. Min difficulty using right hand, mod difficulty with left on green/blue/black pins      Theraputty   Theraputty - Flatten  red-bilateral hands    Theraputty - Pinch  3 point-red  (Pended)              OT Education - 09/10/17 1111    Education provided  Yes    Education Details  theraputty exercises    Person(s) Educated  Patient    Methods  Explanation;Demonstration;Handout    Comprehension  Verbalized understanding;Returned demonstration       OT Short Term Goals - 09/04/17 0749      OT SHORT TERM GOAL #1   Title  Pt will be provided with and educated on HEP to improve ability to participate in ADL tasks.     Time  4    Period  Weeks    Status  New    Target Date  10/04/17      OT SHORT TERM GOAL #2   Title  Pt will perform toilet transfers and hygiene tasks at supervision to modified independent level with use of AE/DME as needed.     Time  4     Period  Weeks    Status  New      OT SHORT TERM GOAL #3   Title  Pt will improve BUE strength to 5/5 while maintaining cervical precautions to improve ability to use BUE to perform transfers and wheelchair mobility tasks.     Time  4    Period  Weeks    Status  New      OT SHORT TERM GOAL #4   Title  Pt will improve bilateral  grip strength by 10# and left pinch strength by 3# to improve ability to wash dishes.     Time  4    Period  Weeks    Status  New      OT SHORT TERM GOAL #5   Title  Pt will perform LB dressing tasks with supervision to min guard assist using AE as needed to compensate for mobility limitations.     Time  4    Period  Weeks    Status  New               Plan - 09/10/17 1210    Clinical Impression Statement  A: Initiated grip and pinch strengthening this session, completing hand gripper exercises, pinch tree activity, and theraputty task. Pt with decreased activity tolerance when using left hand due to occasional thumb pain, therefore working on 3 point pinch tasks. Pt reporting today that she is able to dress with AE and was using the restroom independently prior to discharging from rehab in [place], states her family does not let her attempt tasks by herself due to fear of her getting hurt. Pt encouraged to use BUE as much as possible during the day, working on hand strength as well.     Plan  P: Simulate LB dressing tasks and toileting tasks, follow up on theraputty HEP       Patient will benefit from skilled therapeutic intervention in order to improve the following deficits and impairments:  Impaired vision/preception, Decreased knowledge of precautions, Impaired perceived functional ability, Decreased activity tolerance, Decreased knowledge of use of DME, Decreased strength, Decreased coordination, Decreased safety awareness, Impaired UE functional use  Visit Diagnosis: Muscle weakness (generalized)  Other symptoms and signs involving the  musculoskeletal system    Problem List Patient Active Problem List   Diagnosis Date Noted  . RLQ abdominal pain 08/15/2016  . Diabetic complication ([place]) 32/67/1245  . Mucosal abnormality of stomach   . Diverticulosis of colon without hemorrhage   . Dysphagia, pharyngoesophageal phase   . Esophageal dysphagia 12/02/2014  . LLQ pain 02/22/2014  . Abdominal pain, epigastric 02/22/2014  . Encounter for therapeutic drug monitoring 05/18/2013  . Adenomatous polyps 11/21/2011  . Long term (current) use of anticoagulants 07/03/2010  . Permanent atrial fibrillation ([place]) 08/04/2009  . RENAL DISEASE, CHRONIC, STAGE III 08/04/2009  . IBS 04/14/2009  . Diabetes mellitus with stage 3 chronic kidney disease ([place]) 02/17/2009  . ANXIETY 02/17/2009  . GERD 02/17/2009  . DIARRHEA, CHRONIC 02/17/2009  . Mixed hyperlipidemia 12/21/2008  . Essential hypertension, benign 12/21/2008   Guadelupe Sabin, OTR/L  [phone] 09/10/2017, 12:16 PM  [place] [address] [place], [place], [address] Phone: [phone]   Fax:  [phone]  Name: [name] MRN: [id number] Date of Birth: [birth year]

## 2017-09-10 NOTE — Therapy (Signed)
[place], [place], [address] Phone: [phone]   Fax:  [phone]  Physical Therapy Treatment  Patient Details  Name: [name] MRN: [id number] Date of Birth: [birth year] Referring Provider: Dr. Caren Macadam   Encounter Date: 09/10/2017  PT End of Session - 09/10/17 1216    Visit Number  2    Number of Visits  17    Date for PT Re-Evaluation  10/31/17 Mini-reassessment: 10/01/17    Authorization Type  Primary: Medicare; Secondary: AARP    Authorization Time Period  09/03/17 - 10/31/17    Authorization - Visit Number  2    Authorization - Number of Visits  10    PT Start Time  1127    PT Stop Time  1207    PT Time Calculation (min)  40 min    Equipment Utilized During Treatment  Gait belt    Activity Tolerance  Patient tolerated treatment well;Patient limited by fatigue    Behavior During Therapy  [facility] for tasks assessed/performed       Past Medical History:  Diagnosis Date  . Anxiety   . Asthma   . Chronic back pain   . Colonoscopy causing post-procedural bleeding    Incomplete. by Dr. [name] 06/11/99 due to fixed non-compliant colon precluded exam to 40 cm, she had subsequent colectomy for diverticulitis since that time.   . Diarrhea   . Esophageal reflux   . Essential hypertension, benign   . Fibromyalgia   . Hiatal hernia    Recent EGD/TCS showed small hiatal hernia, normal apperaring tubular esophagus. s/p passage of a 54-french Maloney dilator, s/p biopsy esophageal mucosa, multiple fundal gland type gastric polyps. one large pedunculated polp with oozing, noted s/p clipping and snare polypectomy. Biopsies of the gastric mucosa taken given her symptoms in her elevated count. normal D1-D3, s/p biospy D2-D3.   [name] Hiatal hernia    all bx unremarkable. on TCS she had left-sided diverticula, evidence of prior segmental resection with anastomosis, multiple colonic polyps s/p multiple snare polypectomies,  s/p segmental biopsy and stool sampling. she had multiple tubular adenomas. no microscopic/collagenous colitis. stool culture, c diff, O+P, lactoferrin were negative.   . Mixed hyperlipidemia   . Nephrolithiasis   . OSA (obstructive sleep apnea)   . Permanent atrial fibrillation (HCC)    Previous failed DCCV  . Type 2 diabetes mellitus ([place])   . Ureteral stent retained    Not retained. ureteral stent removal gross hematuria resolved 10/11. coumadin restarted.   . Ventral hernia     Past Surgical History:  Procedure Laterality Date  . BREAST LUMPECTOMY     benign cyst  . CATARACT EXTRACTION W/PHACO Left 08/17/2012   Procedure: CATARACT EXTRACTION PHACO AND INTRAOCULAR LENS PLACEMENT (IOC);  Surgeon: [name], MD;  Location: [facility];  Service: Ophthalmology;  Laterality: Left;  CDE:18.73  . COLECTOMY     2005. Dr. Geoffry Paradise for diverticulitis  . COLONOSCOPY  02/22/09   normal/left-sided diverticula/multiple colonic polyp, adenomatous  . COLONOSCOPY  12/16/2011   colonic polyps-treated as described above. Status postprior sigmoid resection. adenomatous. next TCS 12/2014  . COLONOSCOPY N/A 12/28/2014   Status post prior segmental resection. Few residual colonic diverticula- status post segmental biopsy negative random colon biopsies  . ESOPHAGEAL DILATION N/A 12/28/2014   Procedure: ESOPHAGEAL DILATION;  Surgeon: [name], MD;  Location: [facility];  Service: Endoscopy;  Laterality: N/A;  . ESOPHAGOGASTRODUODENOSCOPY  02/22/09   normal  s/p dilator;small HH/one large pedunculates polyp  s/p clipping, hyperplastic  . ESOPHAGOGASTRODUODENOSCOPY  12/16/2011   [name]: small hiatal hernia. Hyperplastic apperating polyps. Status post [name] dilation as described above.  . ESOPHAGOGASTRODUODENOSCOPY N/A 12/28/2014   RMR: Normal-appearing esophagus status post passage of a maloney dilator. Hiatal hernia. abnormal gastic mucosa of uncertain significance status post gastric biopsy with mild  chronic gastritis, no H pylori.   . TONSILLECTOMY    . TOTAL ABDOMINAL HYSTERECTOMY      There were no vitals filed for this visit.  Subjective Assessment - 09/10/17 1141    Subjective  Patient reports she walks at home with a RW and that she is not able to walk far right now. Before her fall and SDH she reports she was walking around the food store but limited her distance because of her LBP. She reports she is a little tired from working with speech and OT.     Patient is accompained by:  Family member Son in waiting room    Pertinent History  Fall down 21 steps, SDH and craniotomy to correct this on 07/26/17. C2 fractures. History of DMII and HTN.     Limitations  Walking;Standing;House hold activities    How long can you sit comfortably?  Not limited    How long can you stand comfortably?  Used to stand for a couple hours at a time. Couple minutes with support    How long can you walk comfortably?  20 feet    Patient Stated Goals  Get back to doing regular household activities    Currently in Pain?  Yes    Pain Score  4     Pain Location  Head    Pain Orientation  -- around her head, like a headband    Pain Descriptors / Indicators  Aching    Pain Type  Acute pain    Pain Radiating Towards  from the back of her head up around her forehead    Pain Onset  More than a month ago    Pain Frequency  Intermittent       [facility] Adult PT Treatment/Exercise - 09/10/17 1127      Transfers   Transfers  Stand Pivot Transfers    Stand Pivot Transfers  5: Supervision;4: Min guard    Stand Pivot Transfer Details (indicate cue type and reason)  cues for safe sequencing of hand placement and for turning/management of RW and step pattern iwthin walker, 2 reps      Ambulation/Gait   Ambulation/Gait  Yes    Ambulation/Gait Assistance  5: Supervision;4: Min guard with wheelchair follow    Ambulation/Gait Assistance Details  verbal cues for safe management of RW and for heel toe pattern as well as  cues for obstacle negotiation, wheelchair follow for safety    Ambulation Distance (Feet)  40 Feet 3 bouts: 25', 35', 540'    Assistive device  Rolling walker    Gait Pattern  Step-through pattern;Decreased step length - left;Decreased step length - right;Decreased stride length;Decreased hip/knee flexion - right;Decreased hip/knee flexion - left;Decreased dorsiflexion - right;Decreased dorsiflexion - left    Ambulation Surface  Level    Gait Comments  slow and labored gait velocity      Exercises   Exercises  Knee/Hip      Knee/Hip Exercises: Seated   Other Seated Knee/Hip Exercises  heel raises x 20 reps, bil LE, 3 second holds    Other Seated Knee/Hip Exercises  toe raises x  20 reps, bil LE, 3 second holds    Marching  Strengthening;Both;2 sets;10 reps    Sit to Sand  2 sets;5 reps;with UE support slow/labored movements        PT Education - 09/10/17 1224    Education Details  Reviewed evaluation and goals, educated on transfers and gait with RW, Educated on safe scanning for environment negotiation due to cervical collar. Instructed on initial seated HEP.    Person(s) Educated  Patient    Methods  Explanation;Handout;Demonstration;Verbal cues    Comprehension  Verbalized understanding;Returned demonstration       PT Short Term Goals - 09/03/17 1541      PT SHORT TERM GOAL #1   Title  Patient will demonstrate understanding and report regular compliance with HEP in order to improve functional mobility and return to prior level of function.     Time  4    Period  Weeks    Status  New    Target Date  09/29/17      PT SHORT TERM GOAL #2   Title  Patient will demonstrate ability to perform stand pivot transfer with no more than supervision assistance.     Time  4    Period  Weeks    Status  New    Target Date  10/01/17      PT SHORT TERM GOAL #3   Title  Patient will demonstrate ability to ambulate 80 feet with least retrictive assistive device and no more than supervision  assistance in order to perform household ambulation with improved safety.     Time  4    Period  Weeks    Status  New    Target Date  10/01/17        PT Long Term Goals - 09/03/17 1547      PT LONG TERM GOAL #1   Title  Patient will demonstrate ability to ambulate 180 feet with least retrictive assistive device and no more than supervision assistance in order to perform household ambulation with improved safety at a velocity of 0.8 m/s.     Time  8    Period  Weeks    Status  New    Target Date  10/29/17      PT LONG TERM GOAL #2   Title  Patient will report ability to stand for 20 minutes in order to prepare a meal.     Time  8    Period  Weeks    Status  New    Target Date  10/29/17      PT LONG TERM GOAL #3   Title  If cleared with spinal precautions, patient will demonstrate ability to perform bed mobility including rolling and supine to sit with no more than supervision assistance.     Time  8    Period  Weeks    Status  New    Target Date  10/29/17        Plan - 09/10/17 1201    Clinical Impression Statement  Session began with review of evaluation and goals for functional mobility. Patient was limited throughuot session by fatigue and required frequent seated rest breaks. She initiated bil LE strengthening for HEP and gait/transfer training with RW. She was able to ambulate 3x between 25-40 feet with RW and minimum guard with verbal cues for safe environment navigation/obstacle negotiation. She will conitnue to benefit from skilled PT interventinos to address current impairments and progress functional mobility and endurance.  Rehab Potential  Fair    Clinical Impairments Affecting Rehab Potential  Positive: family support, motivated. Negative: Complexity of medical conditions    PT Frequency  2x / week    PT Duration  8 weeks    PT Treatment/Interventions  ADLs/Self Care Home Management;DME Instruction;Gait training;Stair training;Functional mobility  training;Therapeutic activities;Therapeutic exercise;Balance training;Neuromuscular re-education;Patient/family education;Orthotic Fit/Training;Wheelchair mobility training;Manual techniques;Passive range of motion;Energy conservation    PT Next Visit Plan  Continue with seated strengthening and sit to stands. [place] with gait training with RW and transfer training. Perform nustep for LE endurance training (LE's only)    PT Home Exercise Plan  09/10/17 - Seated marching, toes raises, and heel raises;     Consulted and Agree with Plan of Care  Patient       Patient will benefit from skilled therapeutic intervention in order to improve the following deficits and impairments:  Abnormal gait, Decreased balance, Decreased endurance, Decreased mobility, Difficulty walking, Dizziness, Improper body mechanics, Obesity, Decreased activity tolerance, Decreased knowledge of use of DME, Decreased strength, Postural dysfunction, Pain  Visit Diagnosis: S/P evacuation of subdural hematoma  Muscle weakness (generalized)  Other symptoms and signs involving the musculoskeletal system     Problem List Patient Active Problem List   Diagnosis Date Noted  . RLQ abdominal pain 08/15/2016  . Diabetic complication ([place]) 72/53/6644  . Mucosal abnormality of stomach   . Diverticulosis of colon without hemorrhage   . Dysphagia, pharyngoesophageal phase   . Esophageal dysphagia 12/02/2014  . LLQ pain 02/22/2014  . Abdominal pain, epigastric 02/22/2014  . Encounter for therapeutic drug monitoring 05/18/2013  . Adenomatous polyps 11/21/2011  . Long term (current) use of anticoagulants 07/03/2010  . Permanent atrial fibrillation ([place]) 08/04/2009  . RENAL DISEASE, CHRONIC, STAGE III 08/04/2009  . IBS 04/14/2009  . Diabetes mellitus with stage 3 chronic kidney disease ([place]) 02/17/2009  . ANXIETY 02/17/2009  . GERD 02/17/2009  . DIARRHEA, CHRONIC 02/17/2009  . Mixed hyperlipidemia 12/21/2008  . Essential  hypertension, benign 12/21/2008    Kipp Brood, PT, DPT Physical Therapist with [facility]  09/10/2017 12:25 PM    [place], [place], [address] Phone: [phone]   Fax:  [phone]  Name: RICHELL CORKER MRN: [id number] Date of Birth: [birth year]

## 2017-09-11 ENCOUNTER — Encounter: Payer: Self-pay | Admitting: Family Medicine

## 2017-09-11 ENCOUNTER — Other Ambulatory Visit: Payer: Self-pay

## 2017-09-11 ENCOUNTER — Telehealth: Payer: Self-pay | Admitting: Family Medicine

## 2017-09-11 ENCOUNTER — Emergency Department (HOSPITAL_COMMUNITY): Payer: Medicare Other

## 2017-09-11 ENCOUNTER — Emergency Department (HOSPITAL_COMMUNITY)
Admission: EM | Admit: 2017-09-11 | Discharge: 2017-09-12 | Disposition: A | Discharge status: Home / Self Care | Payer: Medicare Other | Attending: Emergency Medicine | Admitting: Emergency Medicine

## 2017-09-11 ENCOUNTER — Encounter (HOSPITAL_COMMUNITY): Payer: Self-pay | Admitting: *Deleted

## 2017-09-11 DIAGNOSIS — Y9389 Activity, other specified: Secondary | ICD-10-CM | POA: Diagnosis not present

## 2017-09-11 DIAGNOSIS — F419 Anxiety disorder, unspecified: Secondary | ICD-10-CM | POA: Insufficient documentation

## 2017-09-11 DIAGNOSIS — Y929 Unspecified place or not applicable: Secondary | ICD-10-CM | POA: Insufficient documentation

## 2017-09-11 DIAGNOSIS — Z79899 Other long term (current) drug therapy: Secondary | ICD-10-CM | POA: Insufficient documentation

## 2017-09-11 DIAGNOSIS — E1122 Type 2 diabetes mellitus with diabetic chronic kidney disease: Secondary | ICD-10-CM | POA: Insufficient documentation

## 2017-09-11 DIAGNOSIS — Y998 Other external cause status: Secondary | ICD-10-CM | POA: Diagnosis not present

## 2017-09-11 DIAGNOSIS — W01198A Fall on same level from slipping, tripping and stumbling with subsequent striking against other object, initial encounter: Secondary | ICD-10-CM | POA: Insufficient documentation

## 2017-09-11 DIAGNOSIS — R531 Weakness: Secondary | ICD-10-CM | POA: Diagnosis not present

## 2017-09-11 DIAGNOSIS — Z794 Long term (current) use of insulin: Secondary | ICD-10-CM | POA: Diagnosis not present

## 2017-09-11 DIAGNOSIS — Y92009 Unspecified place in unspecified non-institutional (private) residence as the place of occurrence of the external cause: Secondary | ICD-10-CM

## 2017-09-11 DIAGNOSIS — S0003XA Contusion of scalp, initial encounter: Secondary | ICD-10-CM | POA: Insufficient documentation

## 2017-09-11 DIAGNOSIS — J45909 Unspecified asthma, uncomplicated: Secondary | ICD-10-CM | POA: Diagnosis not present

## 2017-09-11 DIAGNOSIS — M25511 Pain in right shoulder: Secondary | ICD-10-CM | POA: Insufficient documentation

## 2017-09-11 DIAGNOSIS — W19XXXA Unspecified fall, initial encounter: Secondary | ICD-10-CM | POA: Diagnosis not present

## 2017-09-11 DIAGNOSIS — I129 Hypertensive chronic kidney disease with stage 1 through stage 4 chronic kidney disease, or unspecified chronic kidney disease: Secondary | ICD-10-CM | POA: Diagnosis not present

## 2017-09-11 DIAGNOSIS — S0990XA Unspecified injury of head, initial encounter: Secondary | ICD-10-CM | POA: Diagnosis present

## 2017-09-11 DIAGNOSIS — R51 Headache: Secondary | ICD-10-CM | POA: Diagnosis not present

## 2017-09-11 DIAGNOSIS — N183 Chronic kidney disease, stage 3 (moderate): Secondary | ICD-10-CM | POA: Insufficient documentation

## 2017-09-11 DIAGNOSIS — S199XXA Unspecified injury of neck, initial encounter: Secondary | ICD-10-CM | POA: Diagnosis not present

## 2017-09-11 NOTE — ED Notes (Signed)
Pt reports falling transferring from commode to wheel chair at home tonight. Pt reports neck pain and posterior head pain. Pt reports blurry vision. Pt recently had surgery on brain and is worried she re-injured this part of her head.

## 2017-09-11 NOTE — ED Notes (Signed)
Patient transported to CT 

## 2017-09-11 NOTE — Telephone Encounter (Signed)
Pt is calling to advise that the hosp sent everything to you --there is no disk

## 2017-09-11 NOTE — ED Triage Notes (Signed)
Pt states that she was on the commode and it was loose, pt was trying to get up with a walker and fell hitting the back of her head. Denies any LOC,

## 2017-09-12 ENCOUNTER — Telehealth (HOSPITAL_COMMUNITY): Payer: Self-pay | Admitting: Family Medicine

## 2017-09-12 ENCOUNTER — Ambulatory Visit (HOSPITAL_COMMUNITY): Payer: Medicare Other | Admitting: Physical Therapy

## 2017-09-12 ENCOUNTER — Encounter: Payer: Self-pay | Admitting: Family Medicine

## 2017-09-12 DIAGNOSIS — R51 Headache: Secondary | ICD-10-CM | POA: Diagnosis not present

## 2017-09-12 DIAGNOSIS — S199XXA Unspecified injury of neck, initial encounter: Secondary | ICD-10-CM | POA: Diagnosis not present

## 2017-09-12 DIAGNOSIS — S0990XA Unspecified injury of head, initial encounter: Secondary | ICD-10-CM | POA: Diagnosis not present

## 2017-09-12 NOTE — Discharge Instructions (Addendum)
Use ice packs over the sore place of your head. Continue wearing your cervical collar. I gave you the name of the neurosurgeon on call tonight, Dr Arnoldo Morale, call his office to get an appointment. You should also request your records from [place] to be sent to his office before your visit. Return to the ED for any problems on the head injury sheet.

## 2017-09-12 NOTE — Telephone Encounter (Signed)
09/12/17  guy that brings her called to say that she fell and she went to [facility] ED and got home around 4-5 this AM.  He said that she said she was just to sore to do therapy toda

## 2017-09-12 NOTE — ED Provider Notes (Signed)
[facility] EMERGENCY DEPARTMENT Provider Note   CSN: [id number] Arrival date & time: 09/11/17  2146  Time seen 23:20 PM    History   Chief Complaint Chief Complaint  Patient presents with  . Fall    HPI [name] is a 83 y.o. female.  HPI patient states she was visiting family in [place] when she fell down a flight of stairs on May 19.  What the patient describes is it sounds like she had a subdural hematoma that was drained by her neurosurgeon and also had a fracture of C2 and C7 and is in a cervical collar.  Patient was on Coumadin at the time.  She states today around 5 PM she was sitting on a raised toilet and when she got up she had her hands on her walker and the lift seat on the toilet tilted and she fell over with the walker.  She states she got hit in the back of the head.  She complains of posterior headache in her head.  She denies loss of consciousness.  She was wearing her Aspen collar when she fell.  She denies any other injury although she states she has chronic pain in her right shoulder from rotator cuff injury that may be worse.  She denies any new numbness or tingling of her extremities.  She has had some mild nausea off and on.  She states since her initial fall in [place] she has had some visual changes.  She was seen by her primary care doctor, Dr. [name] yesterday who is referring her to [facility] to see a neurosurgeon however she does not want to go to [facility].  PCP Caren Macadam, MD   Past Medical History:  Diagnosis Date  . Anxiety   . Asthma   . Chronic back pain   . Colonoscopy causing post-procedural bleeding    Incomplete. by Dr. [name] 06/11/99 due to fixed non-compliant colon precluded exam to 40 cm, she had subsequent colectomy for diverticulitis since that time.   . Diarrhea   . Esophageal reflux   . Essential hypertension, benign   . Fibromyalgia   . Hiatal hernia    Recent EGD/TCS showed small hiatal hernia, normal  apperaring tubular esophagus. s/p passage of a 54-french Maloney dilator, s/p biopsy esophageal mucosa, multiple fundal gland type gastric polyps. one large pedunculated polp with oozing, noted s/p clipping and snare polypectomy. Biopsies of the gastric mucosa taken given her symptoms in her elevated count. normal D1-D3, s/p biospy D2-D3.   [name] Hiatal hernia    all bx unremarkable. on TCS she had left-sided diverticula, evidence of prior segmental resection with anastomosis, multiple colonic polyps s/p multiple snare polypectomies, s/p segmental biopsy and stool sampling. she had multiple tubular adenomas. no microscopic/collagenous colitis. stool culture, c diff, O+P, lactoferrin were negative.   . Mixed hyperlipidemia   . Nephrolithiasis   . OSA (obstructive sleep apnea)   . Permanent atrial fibrillation (HCC)    Previous failed DCCV  . Type 2 diabetes mellitus ([place])   . Ureteral stent retained    Not retained. ureteral stent removal gross hematuria resolved 10/11. coumadin restarted.   . Ventral hernia     Patient Active Problem List   Diagnosis Date Noted  . RLQ abdominal pain 08/15/2016  . Diabetic complication ([place]) 71/24/5809  . Mucosal abnormality of stomach   . Diverticulosis of colon without hemorrhage   . Dysphagia, pharyngoesophageal phase   . Esophageal dysphagia 12/02/2014  . LLQ  pain 02/22/2014  . Abdominal pain, epigastric 02/22/2014  . Encounter for therapeutic drug monitoring 05/18/2013  . Adenomatous polyps 11/21/2011  . Long term (current) use of anticoagulants 07/03/2010  . Permanent atrial fibrillation ([place]) 08/04/2009  . RENAL DISEASE, CHRONIC, STAGE III 08/04/2009  . IBS 04/14/2009  . Diabetes mellitus with stage 3 chronic kidney disease ([place]) 02/17/2009  . ANXIETY 02/17/2009  . GERD 02/17/2009  . DIARRHEA, CHRONIC 02/17/2009  . Mixed hyperlipidemia 12/21/2008  . Essential hypertension, benign 12/21/2008    Past Surgical History:  Procedure Laterality Date   . BREAST LUMPECTOMY     benign cyst  . CATARACT EXTRACTION W/PHACO Left 08/17/2012   Procedure: CATARACT EXTRACTION PHACO AND INTRAOCULAR LENS PLACEMENT (IOC);  Surgeon: [name], MD;  Location: [facility] ORS;  Service: Ophthalmology;  Laterality: Left;  CDE:18.73  . COLECTOMY     2005. Dr. Geoffry Paradise for diverticulitis  . COLONOSCOPY  02/22/09   normal/left-sided diverticula/multiple colonic polyp, adenomatous  . COLONOSCOPY  12/16/2011   colonic polyps-treated as described above. Status postprior sigmoid resection. adenomatous. next TCS 12/2014  . COLONOSCOPY N/A 12/28/2014   Status post prior segmental resection. Few residual colonic diverticula- status post segmental biopsy negative random colon biopsies  . ESOPHAGEAL DILATION N/A 12/28/2014   Procedure: ESOPHAGEAL DILATION;  Surgeon: [name], MD;  Location: [facility] ENDO SUITE;  Service: Endoscopy;  Laterality: N/A;  . ESOPHAGOGASTRODUODENOSCOPY  02/22/09   normal s/p dilator;small HH/one large pedunculates polyp  s/p clipping, hyperplastic  . ESOPHAGOGASTRODUODENOSCOPY  12/16/2011   [name]: small hiatal hernia. Hyperplastic apperating polyps. Status post [name] dilation as described above.  . ESOPHAGOGASTRODUODENOSCOPY N/A 12/28/2014   RMR: Normal-appearing esophagus status post passage of a maloney dilator. Hiatal hernia. abnormal gastic mucosa of uncertain significance status post gastric biopsy with mild chronic gastritis, no H pylori.   . TONSILLECTOMY    . TOTAL ABDOMINAL HYSTERECTOMY       OB History   None      Home Medications    Prior to Admission medications   Medication Sig Start Date End Date Taking? Authorizing Provider  acetaminophen (TYLENOL) 325 MG tablet Take 650 mg by mouth every 6 (six) hours as needed for pain.    Yes [provider]  ALPRAZolam Duanne Moron) 0.5 MG tablet Take 0.5 mg by mouth 2 (two) times daily. *May take one and one-half tablet at bedtime as needed   Yes [provider]  amLODipine  (NORVASC) 10 MG tablet Take 5 mg by mouth daily.    Yes [provider]  cloNIDine (CATAPRES - DOSED IN MG/24 HR) 0.1 mg/24hr patch APPLY 1 PATCH ONCE A WEEK AS DIRECTED Patient taking differently: APPLY 1 PATCH ONCE A WEEK   Yes [name], MD  diclofenac sodium (VOLTAREN) 1 % GEL Apply 2 g topically at bedtime. ON SHOULDERS FOR PAIN 08/21/17  Yes [provider]  dicyclomine (BENTYL) 10 MG capsule Take two in am and one at bedtime for diarrhea. Hold for constipation. Patient taking differently: Take 10-20 mg by mouth See admin instructions. Take two in am and one at bedtime for diarrhea. Hold for constipation. 02/16/16  Yes Mahala Menghini, PA-C  Insulin Glargine (LANTUS SOLOSTAR) 100 UNIT/ML Solostar Pen INJECT 20 UNITS INTO THE SKIN DAILY AT BREAKFAST Patient taking differently: Inject 15 Units into the skin every morning.  09/09/17  Yes Nida, Marella Chimes, MD  insulin lispro (HUMALOG) 100 UNIT/ML injection Inject 3 Units into the skin once.   Yes [provider]  metoprolol tartrate (LOPRESSOR) 25 MG tablet TAKE 1 TABLET BY MOUTH TWICE DAILY - Has MD appt scheduled Patient taking differently: Take 25 mg by mouth 2 (two) times daily.  07/10/17  Yes [name], MD  nitroGLYCERIN (NITROSTAT) 0.4 MG SL tablet PLACE 1 TABLET UNDER THE TONGUE EVERY 5 MINUTES FOR 3 DOSES AS NEEDED FOR CHEST PAIN. IF YOU HAVE NO RELIEF AFTER THE 3RD DOSE PROCEED TO TH 12/02/16  Yes [name], MD  ondansetron (ZOFRAN-ODT) 4 MG disintegrating tablet Take 4 mg by mouth every 8 (eight) hours as needed. Nausea and Vomiting 11/11/11  Yes [provider]  Oxycodone HCl 10 MG TABS Take 10 mg by mouth every 4 (four) hours.  08/08/16  Yes [provider]  pantoprazole (PROTONIX) 40 MG tablet TAKE ONE TABLET BY MOUTH TWICE DAILY 04/23/17  Yes Carlis Stable, NP  polyethylene glycol (MIRALAX / GLYCOLAX) packet Take 17 g by mouth daily.   Yes [provider]    simvastatin (ZOCOR) 20 MG tablet Take 1 tablet by mouth daily. 08/03/13  Yes [provider]  tizanidine (ZANAFLEX) 2 MG capsule Take 2 mg by mouth 3 (three) times daily as needed for muscle spasms.    Yes [provider]  Vitamin D, Ergocalciferol, (DRISDOL) 50000 units CAPS capsule Take 50,000 Units by mouth once a week. 06/27/17  Yes [provider]  Continuous Blood Gluc Sensor (FREESTYLE LIBRE SENSOR SYSTEM) MISC Use one sensor every 10 days. 03/04/17   Cassandria Anger, MD    Family History Family History  Problem Relation Age of Onset  . Hypertension Father   . Diabetes Father   . Heart attack Father   . Heart attack Mother   . Diabetes Mother   . Hypertension Mother   . Depression Son   . Pancreatitis Son   . Coronary artery disease Unknown        Family Hx   . Colon cancer Maternal Grandfather   . Heart disease Sister   . Mental retardation Brother   . Hernia Son     Social History Social History   Tobacco Use  . Smoking status: Never Smoker  . Smokeless tobacco: Never Used  . Tobacco comment: tobacco use - no  Substance Use Topics  . Alcohol use: No    Alcohol/week: 0.0 oz  . Drug use: No  lives in [place] Lives at home Using a walker since her fall in [place], before would use a cane as needed.    Allergies   Adhesive [tape]; Demerol [meperidine]; Iohexol; Meperidine hcl; Shellfish allergy; and Penicillins   Review of Systems Review of Systems  All other systems reviewed and are negative.    Physical Exam Updated Vital Signs BP (!) 185/88 (BP Location: Right Arm)   Pulse (!) 105   Temp 98.5 F (36.9 C) (Oral)   Resp 17   Ht 5\' 3"  (1.6 m)   Wt 96.2 kg (212 lb)   SpO2 97%   BMI 37.55 kg/m   Vital signs normal except for hypertension and tachycardia   Physical Exam  Constitutional: She appears well-developed and well-nourished.  HENT:  Head: Normocephalic and atraumatic.  Right Ear: External ear normal.  Left  Ear: External ear normal.  Nose: Nose normal.  Mouth/Throat: Oropharynx is clear and moist.  Eyes: Pupils are equal, round, and reactive to light. Conjunctivae and EOM are normal.  Neck:  Aspen collar in place  Cardiovascular: Normal rate and regular  rhythm.  Pulmonary/Chest: Effort normal and breath sounds normal. No respiratory distress.  Musculoskeletal: She exhibits tenderness.  Tender over her right shoulder  Skin: Skin is warm and dry. Capillary refill takes less than 2 seconds.  Psychiatric: She has a normal mood and affect. Her behavior is normal. Thought content normal.     ED Treatments / Results  Labs (all labs ordered are listed, but only abnormal results are displayed) Labs Reviewed - No data to display  EKG None  Radiology Dg Shoulder Right  Result Date: 09/12/2017 CLINICAL DATA:  Status post multiple falls, with right shoulder pain. Neck pain. Initial encounter. EXAM: RIGHT SHOULDER - 2+ VIEW COMPARISON:  Right shoulder radiographs performed 12/16/2005, and right shoulder MRI performed 12/29/2005 FINDINGS: There is no evidence of fracture or dislocation. The right humeral head is seated within the glenoid fossa. Minimal degenerative change is noted at the inferior glenoid. Degenerative change is noted at the right acromioclavicular joint. No significant soft tissue abnormalities are seen. The visualized portions of the right lung are clear. IMPRESSION: No evidence of fracture or dislocation. Electronically Signed   By: Garald Balding M.D.   On: 09/12/2017 00:38   Ct Head Wo Contrast Ct Cervical Spine Wo Contrast  Result Date: 09/12/2017 CLINICAL DATA:  Fall. Head pain and blurry vision. Recent head surgery. EXAM: CT HEAD WITHOUT CONTRAST CT CERVICAL SPINE WITHOUT CONTRAST TECHNIQUE: Multidetector CT imaging of the head and cervical spine was performed following the standard protocol without intravenous contrast. Multiplanar CT image reconstructions of the cervical spine  were also generated. COMPARISON:  None. FINDINGS: CT HEAD FINDINGS Brain: There is no mass, hemorrhage or extra-axial collection. The size and configuration of the ventricles and extra-axial CSF spaces are normal. There is no acute or chronic infarction. The brain parenchyma is normal. Vascular: No abnormal hyperdensity of the major intracranial arteries or dural venous sinuses. No intracranial atherosclerosis. Skull: The visualized skull base, calvarium and extracranial soft tissues are normal. Sinuses/Orbits: No fluid levels or advanced mucosal thickening of the visualized paranasal sinuses. No mastoid or middle ear effusion. The orbits are normal. CT CERVICAL SPINE FINDINGS Alignment: There is grade 1 anterolisthesis at C2-C3 and C3-C4. Skull base and vertebrae: There is a type 3 fracture of the C2 vertebral body that extends through both transverse foramina. No facet subluxation. There is also a minimally displaced fracture of the C7 spinous process. Soft tissues and spinal canal: No prevertebral fluid or swelling. No visible canal hematoma. Disc levels: Multilevel uncovertebral and facet degenerative disease. Upper chest: No pneumothorax, pulmonary nodule or pleural effusion. Other: Normal visualized paraspinal cervical soft tissues. IMPRESSION: 1. Postsurgical changes of recent craniotomy, likely for hematoma evacuation. No acute intracranial abnormality. 2. Type 3 dens fracture traversing both transverse foramina and minimally displaced fracture of the C7 transverse process. These results were discussed by telephone at the time of interpretation on 09/12/2017 at 12:48 am with Dr. Rolland Porter. By history, these fractures occurred approximately 3 weeks ago and were evaluated at an outside institution in [place]. Electronically Signed   By: Ulyses Jarred M.D.   On: 09/12/2017 00:50    Procedures Procedures (including critical care time)  Medications Ordered in ED Medications - No data to display   Initial  Impression / Assessment and Plan / ED Course  I have reviewed the triage vital signs and the nursing notes.  Pertinent labs & imaging results that were available during my care of the patient were reviewed by me and considered in  my medical decision making (see chart for details).    12:43 AM the radiologist called me to let me know about the C2 fracture, although I had put it in the comments when I requested the CT he was unaware that this was a fracture from May 19.  He said it appeared she had had a subdural hematoma on her head CT.  He also states he sees a fracture patient told me about on C7.  Patient is very unhappy about her primary care doctor referring her to a neurosurgeon at [facility].  She was given the number for the neurosurgeon on call tonight, Dr. Arnoldo Morale.   Final Clinical Impressions(s) / ED Diagnoses   Final diagnoses:  Fall in home, initial encounter  Contusion of scalp, initial encounter    ED Discharge Orders    None      Plan discharge  Rolland Porter, MD, Barbette Or, MD 09/12/17 [phone]

## 2017-09-15 ENCOUNTER — Telehealth: Payer: Self-pay | Admitting: Cardiology

## 2017-09-15 NOTE — Telephone Encounter (Signed)
Pt says for the last 5 days BP has been high 200s/80s HR 70s-90s - says this morning was 127/63 also c/o legs and feet have been swollen/SOB - denies active chest pain and weakness in legs and arms since last Wednesday - pt says she was in accident in [place] back in March and was taken off of amlodipine/warfarin (see 5/23 phone note) still taking metoprolol 25 mg bid and clonidine patches. Pt has f/u with Dr Domenic Polite 6/26

## 2017-09-15 NOTE — Telephone Encounter (Signed)
Having BP problems and feet & legs are swelling. Can not get shoes on

## 2017-09-16 ENCOUNTER — Telehealth: Payer: Self-pay | Admitting: "Endocrinology

## 2017-09-16 MED ORDER — AMLODIPINE BESYLATE 2.5 MG PO TABS: 2.5000 mg | ORAL_TABLET | Freq: Every day | ORAL | 1 refills | Status: DC

## 2017-09-16 NOTE — Telephone Encounter (Signed)
Pt voiced understanding - new rx sent to [facility] as requested - pt will continue to monitor BP

## 2017-09-16 NOTE — Telephone Encounter (Signed)
Pt called to verify her number --[phone] this is the number we have on file-- Please call her back.. Thanks

## 2017-09-16 NOTE — Telephone Encounter (Signed)
Please call and advise that I saw she was in the ED with a fall. I am glad she is doing well. The note stated that she told them she did not want to be evaluated by neurosurgery at [place]. Would she like for me to do a referral for [facility]?

## 2017-09-16 NOTE — Telephone Encounter (Signed)
Tried calling patient on the number listed on her chart and it isn't working. Left message on his son's vm requesting that she call us back.

## 2017-09-16 NOTE — Telephone Encounter (Signed)
Let's resume Norvasc although at lower dose, 2.5 mg daily to start.  Continue to track blood pressure.

## 2017-09-16 NOTE — Telephone Encounter (Signed)
LM to return call.

## 2017-09-17 ENCOUNTER — Ambulatory Visit (HOSPITAL_COMMUNITY): Payer: Medicare Other

## 2017-09-17 ENCOUNTER — Telehealth: Payer: Self-pay | Admitting: Family Medicine

## 2017-09-17 ENCOUNTER — Ambulatory Visit (HOSPITAL_COMMUNITY): Payer: Medicare Other | Admitting: Physical Therapy

## 2017-09-17 ENCOUNTER — Encounter (HOSPITAL_COMMUNITY): Payer: Self-pay | Admitting: Physical Therapy

## 2017-09-17 ENCOUNTER — Ambulatory Visit (HOSPITAL_COMMUNITY): Payer: Medicare Other | Admitting: Speech Pathology

## 2017-09-17 ENCOUNTER — Other Ambulatory Visit: Payer: Self-pay

## 2017-09-17 ENCOUNTER — Encounter (HOSPITAL_COMMUNITY): Payer: Self-pay | Admitting: Speech Pathology

## 2017-09-17 ENCOUNTER — Encounter (HOSPITAL_COMMUNITY): Payer: Self-pay

## 2017-09-17 ENCOUNTER — Ambulatory Visit: Payer: Self-pay | Admitting: Family Medicine

## 2017-09-17 VITALS — BP 175/88 | HR 72

## 2017-09-17 DIAGNOSIS — Z8679 Personal history of other diseases of the circulatory system: Secondary | ICD-10-CM

## 2017-09-17 DIAGNOSIS — Z9889 Other specified postprocedural states: Secondary | ICD-10-CM

## 2017-09-17 DIAGNOSIS — R29898 Other symptoms and signs involving the musculoskeletal system: Secondary | ICD-10-CM

## 2017-09-17 DIAGNOSIS — M6281 Muscle weakness (generalized): Secondary | ICD-10-CM

## 2017-09-17 DIAGNOSIS — Z8781 Personal history of (healed) traumatic fracture: Secondary | ICD-10-CM

## 2017-09-17 DIAGNOSIS — R41841 Cognitive communication deficit: Secondary | ICD-10-CM | POA: Diagnosis not present

## 2017-09-17 NOTE — Patient Instructions (Signed)
  LONG ARC QUAD - LAQ - HIGH SEAT While seated with your knee in a bent position, slowly straighten your knee as you raise your foot upwards as shown. Repeat 10 Times Hold 1 Second Complete 2 Sets Perform 1 Time(s) a Day

## 2017-09-17 NOTE — Telephone Encounter (Signed)
Spoke with patient and she stated her daughter is helping her work on transportation to get to neurosurgeons office. I have sent Reine Just a staff message asking him to pull the patients referral to get started on the [place] referral. Will send another message. I encouraged the patient to call me back if she had any questions or concerns with verbal understanding.

## 2017-09-17 NOTE — Telephone Encounter (Signed)
Left message requesting  call back.

## 2017-09-17 NOTE — Telephone Encounter (Signed)
Opened in Error.

## 2017-09-17 NOTE — Therapy (Signed)
[place], [place], [address] Phone: [phone]   Fax:  [phone]  Physical Therapy Treatment  Patient Details  Name: [name] MRN: [id number] Date of Birth: 1934-07-25 Referring Provider: Dr. Caren Macadam   Encounter Date: 09/17/2017  PT End of Session - 09/17/17 1128    Visit Number  3    Number of Visits  17    Date for PT Re-Evaluation  10/31/17 Mini-reassessment: 10/01/17    Authorization Type  Primary: Medicare; Secondary: AARP    Authorization Time Period  09/03/17 - 10/31/17    Authorization - Visit Number  3    Authorization - Number of Visits  10    PT Start Time  3790    PT Stop Time  1115    PT Time Calculation (min)  40 min    Equipment Utilized During Treatment  Gait belt    Activity Tolerance  Patient tolerated treatment well;Patient limited by fatigue    Behavior During Therapy  [facility] for tasks assessed/performed       Past Medical History:  Diagnosis Date  . Anxiety   . Asthma   . Chronic back pain   . Colonoscopy causing post-procedural bleeding    Incomplete. by Dr. [name] 06/11/99 due to fixed non-compliant colon precluded exam to 40 cm, she had subsequent colectomy for diverticulitis since that time.   . Diarrhea   . Esophageal reflux   . Essential hypertension, benign   . Fibromyalgia   . Hiatal hernia    Recent EGD/TCS showed small hiatal hernia, normal apperaring tubular esophagus. s/p passage of a 54-french Maloney dilator, s/p biopsy esophageal mucosa, multiple fundal gland type gastric polyps. one large pedunculated polp with oozing, noted s/p clipping and snare polypectomy. Biopsies of the gastric mucosa taken given her symptoms in her elevated count. normal D1-D3, s/p biospy D2-D3.   [name] Hiatal hernia    all bx unremarkable. on TCS she had left-sided diverticula, evidence of prior segmental resection with anastomosis, multiple colonic polyps s/p multiple snare polypectomies,  s/p segmental biopsy and stool sampling. she had multiple tubular adenomas. no microscopic/collagenous colitis. stool culture, c diff, O+P, lactoferrin were negative.   . Mixed hyperlipidemia   . Nephrolithiasis   . OSA (obstructive sleep apnea)   . Permanent atrial fibrillation (HCC)    Previous failed DCCV  . Type 2 diabetes mellitus ([place])   . Ureteral stent retained    Not retained. ureteral stent removal gross hematuria resolved 10/11. coumadin restarted.   . Ventral hernia     Past Surgical History:  Procedure Laterality Date  . BREAST LUMPECTOMY     benign cyst  . CATARACT EXTRACTION W/PHACO Left 08/17/2012   Procedure: CATARACT EXTRACTION PHACO AND INTRAOCULAR LENS PLACEMENT (IOC);  Surgeon: [name], MD;  Location: [facility];  Service: Ophthalmology;  Laterality: Left;  CDE:18.73  . COLECTOMY     2005. Dr. Geoffry Paradise for diverticulitis  . COLONOSCOPY  02/22/09   normal/left-sided diverticula/multiple colonic polyp, adenomatous  . COLONOSCOPY  12/16/2011   colonic polyps-treated as described above. Status postprior sigmoid resection. adenomatous. next TCS 12/2014  . COLONOSCOPY N/A 12/28/2014   Status post prior segmental resection. Few residual colonic diverticula- status post segmental biopsy negative random colon biopsies  . ESOPHAGEAL DILATION N/A 12/28/2014   Procedure: ESOPHAGEAL DILATION;  Surgeon: [name], MD;  Location: [facility];  Service: Endoscopy;  Laterality: N/A;  . ESOPHAGOGASTRODUODENOSCOPY  02/22/09   normal  s/p dilator;small HH/one large pedunculates polyp  s/p clipping, hyperplastic  . ESOPHAGOGASTRODUODENOSCOPY  12/16/2011   [name]: small hiatal hernia. Hyperplastic apperating polyps. Status post [name] dilation as described above.  . ESOPHAGOGASTRODUODENOSCOPY N/A 12/28/2014   RMR: Normal-appearing esophagus status post passage of a maloney dilator. Hiatal hernia. abnormal gastic mucosa of uncertain significance status post gastric biopsy with mild  chronic gastritis, no H pylori.   . TONSILLECTOMY    . TOTAL ABDOMINAL HYSTERECTOMY      There were no vitals filed for this visit.  Subjective Assessment - 09/17/17 1040    Subjective  Patient reported that she had a fall at home and went ot the hospital and they told her to see a neurosurgeon. Patient stated that she has not started home health therapy.     Patient is accompained by:  Family member Son in waiting room    Pertinent History  Fall down 21 steps, SDH and craniotomy to correct this on 07/26/17. C2 fractures. History of DMII and HTN.     Limitations  Walking;Standing;House hold activities    How long can you sit comfortably?  Not limited    How long can you stand comfortably?  Used to stand for a couple hours at a time. Couple minutes with support    How long can you walk comfortably?  20 feet    Patient Stated Goals  Get back to doing regular household activities    Currently in Pain?  Yes    Pain Score  8     Pain Location  Head    Pain Orientation  Anterior    Pain Descriptors / Indicators  Aching    Pain Type  Chronic pain    Pain Onset  More than a month ago                       [facility] Adult PT Treatment/Exercise - 09/17/17 0001      Transfers   Transfers  Stand Pivot Transfers    Stand Pivot Transfers  5: Supervision;4: Min guard    Stand Pivot Transfer Details (indicate cue type and reason)  2 repetitions. Cues to push up from stable surface and to reach back for chair before sitting.       Knee/Hip Exercises: Stretches   Passive Hamstring Stretch  Right;Left;3 reps;30 seconds;Other (comment) On 8 inch step      Knee/Hip Exercises: Seated   Long Arc Quad  Strengthening;Right;Left;2 sets;10 reps    Other Seated Knee/Hip Exercises  heel raises x 20 reps, bil LE, 3 second holds    Other Seated Knee/Hip Exercises  toe raises x 20 reps, bil LE, 3 second holds    Marching  Strengthening;Both;2 sets;10 reps    Marching Limitations  Alternating legs  3'' holds    Abduction/Adduction   Strengthening;Right;Left;10 reps;3 sets    Sit to Sand  2 sets;5 reps;with UE support;Other (comment) Slow/labored movements. No UE support on 2nd set             PT Education - 09/17/17 1042    Education Details  Patient was educated on Borg percieved exertion scale and updated HEP.     Person(s) Educated  Patient    Methods  Explanation    Comprehension  Verbalized understanding       PT Short Term Goals - 09/17/17 1139      PT SHORT TERM GOAL #1   Title  Patient will demonstrate understanding and report  regular compliance with HEP in order to improve functional mobility and return to prior level of function.     Time  4    Period  Weeks    Status  On-going      PT SHORT TERM GOAL #2   Title  Patient will demonstrate ability to perform stand pivot transfer with no more than supervision assistance.     Time  4    Period  Weeks    Status  On-going      PT SHORT TERM GOAL #3   Title  Patient will demonstrate ability to ambulate 80 feet with least retrictive assistive device and no more than supervision assistance in order to perform household ambulation with improved safety.     Time  4    Period  Weeks    Status  On-going        PT Long Term Goals - 09/17/17 1140      PT LONG TERM GOAL #1   Title  Patient will demonstrate ability to ambulate 180 feet with least retrictive assistive device and no more than supervision assistance in order to perform household ambulation with improved safety at a velocity of 0.8 m/s.     Time  8    Period  Weeks    Status  On-going      PT LONG TERM GOAL #2   Title  Patient will report ability to stand for 20 minutes in order to prepare a meal.     Time  8    Period  Weeks    Status  On-going      PT LONG TERM GOAL #3   Title  If cleared with spinal precautions, patient will demonstrate ability to perform bed mobility including rolling and supine to sit with no more than supervision assistance.      Time  8    Period  Weeks    Status  On-going            Plan - 09/17/17 1137    Clinical Impression Statement  Patient reported that she had a fall at home and went to the hospital. From reviewing her chart noted that they did not suspect a new fracture or dislocation. Spoke to speech therapist who stated that she has followed up about patient getting a neurosurgeon in [place]. This session began with educating patient about the BORG scale and how it could be applied to her time in therapy and also at home. Patient reported understanding and reported her exertion to be no greater than moderate throughout the session. This session incorporated additional seated lower extremity exercises including hip abduction/adduction, hamstring stretches, and long arc quads. Educated patient on continuing exercises at home and incorporating long arc quads. Patient was able to perform sit to stands from the mat table without upper extremity assistance on the second set of 5. Patient reported having some dizziness since her last fall, but reported that she was okay throughout session. Plan to continue following up about patient getting a neurosurgeon.     Rehab Potential  Fair    Clinical Impairments Affecting Rehab Potential  Positive: family support, motivated. Negative: Complexity of medical conditions    PT Frequency  2x / week    PT Duration  8 weeks    PT Treatment/Interventions  ADLs/Self Care Home Management;DME Instruction;Gait training;Stair training;Functional mobility training;Therapeutic activities;Therapeutic exercise;Balance training;Neuromuscular re-education;Patient/family education;Orthotic Fit/Training;Wheelchair mobility training;Manual techniques;Passive range of motion;Energy conservation    PT Next Visit Plan  Follow-up about seeing Neurosurgeon, and continue to see if patient has started HHPT. Continue with seated strengthening and sit to stands. Cotninue with gait training with RW  and transfer training.     PT Home Exercise Plan  09/10/17 - Seated marching, toes raises, and heel raises; 09/17/17: long arc quads 2 x 10 repetitions each lower extremity 1x/day    Consulted and Agree with Plan of Care  Patient       Patient will benefit from skilled therapeutic intervention in order to improve the following deficits and impairments:  Abnormal gait, Decreased balance, Decreased endurance, Decreased mobility, Difficulty walking, Dizziness, Improper body mechanics, Obesity, Decreased activity tolerance, Decreased knowledge of use of DME, Decreased strength, Postural dysfunction, Pain  Visit Diagnosis: S/P evacuation of subdural hematoma  Muscle weakness (generalized)  Other symptoms and signs involving the musculoskeletal system     Problem List Patient Active Problem List   Diagnosis Date Noted  . RLQ abdominal pain 08/15/2016  . Diabetic complication ([place]) 22/63/3354  . Mucosal abnormality of stomach   . Diverticulosis of colon without hemorrhage   . Dysphagia, pharyngoesophageal phase   . Esophageal dysphagia 12/02/2014  . LLQ pain 02/22/2014  . Abdominal pain, epigastric 02/22/2014  . Encounter for therapeutic drug monitoring 05/18/2013  . Adenomatous polyps 11/21/2011  . Long term (current) use of anticoagulants 07/03/2010  . Permanent atrial fibrillation ([place]) 08/04/2009  . RENAL DISEASE, CHRONIC, STAGE III 08/04/2009  . IBS 04/14/2009  . Diabetes mellitus with stage 3 chronic kidney disease ([place]) 02/17/2009  . ANXIETY 02/17/2009  . GERD 02/17/2009  . DIARRHEA, CHRONIC 02/17/2009  . Mixed hyperlipidemia 12/21/2008  . Essential hypertension, benign 12/21/2008     Clarene Critchley PT, DPT 11:42 AM, 09/17/17 [place], [place], [address] Phone: [phone]   Fax:  [phone]  Name: [name] MRN: [id number] Date of Birth: March 20, 1935

## 2017-09-17 NOTE — Therapy (Signed)
[place], [place], [address] Phone: [phone]   Fax:  [phone]  Speech Language Pathology Treatment  Patient Details  Name: [name] MRN: [id number] Date of Birth: 05/08/1934 Referring Provider: Caren Macadam, MD   Encounter Date: 09/17/2017  End of Session - 09/17/17 1106    Visit Number  3    Number of Visits  4    Date for SLP Re-Evaluation  10/09/17    Authorization Type  Medicare and AARP secondary    SLP Start Time  [phone]    SLP Stop Time   1030    SLP Time Calculation (min)  40 min    Activity Tolerance  Patient tolerated treatment well       Past Medical History:  Diagnosis Date  . Anxiety   . Asthma   . Chronic back pain   . Colonoscopy causing post-procedural bleeding    Incomplete. by Dr. [name] 06/11/99 due to fixed non-compliant colon precluded exam to 40 cm, she had subsequent colectomy for diverticulitis since that time.   . Diarrhea   . Esophageal reflux   . Essential hypertension, benign   . Fibromyalgia   . Hiatal hernia    Recent EGD/TCS showed small hiatal hernia, normal apperaring tubular esophagus. s/p passage of a 54-french Maloney dilator, s/p biopsy esophageal mucosa, multiple fundal gland type gastric polyps. one large pedunculated polp with oozing, noted s/p clipping and snare polypectomy. Biopsies of the gastric mucosa taken given her symptoms in her elevated count. normal D1-D3, s/p biospy D2-D3.   [name] Hiatal hernia    all bx unremarkable. on TCS she had left-sided diverticula, evidence of prior segmental resection with anastomosis, multiple colonic polyps s/p multiple snare polypectomies, s/p segmental biopsy and stool sampling. she had multiple tubular adenomas. no microscopic/collagenous colitis. stool culture, c diff, O+P, lactoferrin were negative.   . Mixed hyperlipidemia   . Nephrolithiasis   . OSA (obstructive sleep apnea)   . Permanent atrial fibrillation (HCC)     Previous failed DCCV  . Type 2 diabetes mellitus ([name])   . Ureteral stent retained    Not retained. ureteral stent removal gross hematuria resolved 10/11. coumadin restarted.   . Ventral hernia     Past Surgical History:  Procedure Laterality Date  . BREAST LUMPECTOMY     benign cyst  . CATARACT EXTRACTION W/PHACO Left 08/17/2012   Procedure: CATARACT EXTRACTION PHACO AND INTRAOCULAR LENS PLACEMENT (IOC);  Surgeon: [name], MD;  Location: [facility];  Service: Ophthalmology;  Laterality: Left;  CDE:18.73  . COLECTOMY     2005. Dr. Geoffry Paradise for diverticulitis  . COLONOSCOPY  02/22/09   normal/left-sided diverticula/multiple colonic polyp, adenomatous  . COLONOSCOPY  12/16/2011   colonic polyps-treated as described above. Status postprior sigmoid resection. adenomatous. next TCS 12/2014  . COLONOSCOPY N/A 12/28/2014   Status post prior segmental resection. Few residual colonic diverticula- status post segmental biopsy negative random colon biopsies  . ESOPHAGEAL DILATION N/A 12/28/2014   Procedure: ESOPHAGEAL DILATION;  Surgeon: [name], MD;  Location[facility];  Service: Endoscopy;  Laterality: N/A;  . ESOPHAGOGASTRODUODENOSCOPY  02/22/09   normal s/p dilator;small HH/one large pedunculates polyp  s/p clipping, hyperplastic  . ESOPHAGOGASTRODUODENOSCOPY  12/16/2011   [name]: small hiatal hernia. Hyperplastic apperating polyps. Status post [name] dilation as described above.  . ESOPHAGOGASTRODUODENOSCOPY N/A 12/28/2014   RMR: Normal-appearing esophagus status post passage of a maloney dilator. Hiatal hernia. abnormal gastic mucosa of  uncertain significance status post gastric biopsy with mild chronic gastritis, no H pylori.   . TONSILLECTOMY    . TOTAL ABDOMINAL HYSTERECTOMY      There were no vitals filed for this visit.  Subjective Assessment - 09/17/17 1017    Subjective  "I fell off the commode last week."            ADULT SLP TREATMENT - 09/17/17 1017       General Information   Behavior/Cognition  Alert;Cooperative;Pleasant mood    Patient Positioning  Upright in chair    Oral care provided  N/A    HPI  Pt is an 83 y/o female s/p SDH sustained after a fall down 21 stairs while visiting sister in [place]. Pt underwent a right fronto-temporoparietal craniotomy to decompress on 07/26/17. Pt also sustained a C2 cervical fracture with ligament injuries, [place] brace applied and pt reports she is to wear 24/7 for 8-12 weeks, of which she is 6 weeks out. Of note pt has not been established with neurosurgeon to follow up with in [place]. Pt received IP rehab at [facility] from 5/1-5/17. Pt was referred to speech pathology for evaluation and treatment by Dr. Agapito Games.      Treatment Provided   Treatment provided  Cognitive-Linquistic      Pain Assessment   Pain Assessment  No/denies pain      Cognitive-Linquistic Treatment   Treatment focused on  Cognition;Patient/family/caregiver education;Aphasia    Skilled Treatment  problem solving and organization related to functional tasks      Assessment / Recommendations / Plan   Plan  Continue with current plan of care      Progression Toward Goals   Progression toward goals  Progressing toward goals         SLP Short Term Goals - 09/17/17 1107      SLP SHORT TERM GOAL #1   Title  Pt will implement memory strategies in functional therapy activities with 90% acc with min cues.    Baseline  60%    Time  4    Period  Weeks    Status  On-going      SLP SHORT TERM GOAL #2   Title  Pt will complete selective and alternating attention tasks (moderately complex) with 90% acc and min cues.    Baseline  min/mod cues and extra time to complete    Time  4    Period  Weeks    Status  On-going      SLP SHORT TERM GOAL #3   Title  Pt will implement word-finding strategies with 90% accuracy when unable to verbalize desired word in conversation/functional tasks with min assist.    Baseline  Pt with  circumlocutions at times in conversation    Time  4    Period  Weeks    Status  On-going       SLP Long Term Goals - 09/10/17 [place] #1   Title  Same as short term goals above       Plan - 09/17/17 1107    Clinical Impression Statement Pt's son dropped her off for therapy this date. She reports that she fell off the commode last week and hit her head. She went to [facility] ED and had imaging completed. Her PCP referred her to [facility] for neurosurgical consult, however Pt expressed that she would like to see MD at [facility]. SLP facilitated session by having  Pt place a voice mail to her PCP requesting [facility] referral. Pt also reported feeling overwhelmed by having a "messy" house and being unable to help with cleaning tasks around the home. SLP wrote list of items Pt would like addressed at home with her sons. SLP also gave task for Pt to start completing at home (organizing and getting rid of recipe magazines). SLP provided written list for her to add to her folder at home and to share with her family. No SLP sessions next week due to SLP vacation.  Plan to further facilitate organization strategies at home next visit.    Speech Therapy Frequency  1x /week    Duration  4 weeks    Treatment/Interventions  Cognitive reorganization;SLP instruction and feedback;Internal/external aids;Compensatory strategies;Compensatory techniques;Patient/family education;Functional tasks;Cueing hierarchy    Potential to Achieve Goals  Good    Potential Considerations  Financial resources ? vision changes vs. damaged glasses for reading    SLP Home Exercise Plan  Pt will complete HEP as assigned to facilitate carryover of treatment strategies and techniques in home environment with assist from family.    Consulted and Agree with Plan of Care  Patient;Family member/caregiver    Family Member Consulted  Son, Jeneen Rinks       Patient will benefit from skilled therapeutic intervention in order to improve  the following deficits and impairments:   Cognitive communication deficit    Problem List Patient Active Problem List   Diagnosis Date Noted  . RLQ abdominal pain 08/15/2016  . Diabetic complication ([place]) 37/07/8887  . Mucosal abnormality of stomach   . Diverticulosis of colon without hemorrhage   . Dysphagia, pharyngoesophageal phase   . Esophageal dysphagia 12/02/2014  . LLQ pain 02/22/2014  . Abdominal pain, epigastric 02/22/2014  . Encounter for therapeutic drug monitoring 05/18/2013  . Adenomatous polyps 11/21/2011  . Long term (current) use of anticoagulants 07/03/2010  . Permanent atrial fibrillation ([place]) 08/04/2009  . RENAL DISEASE, CHRONIC, STAGE III 08/04/2009  . IBS 04/14/2009  . Diabetes mellitus with stage 3 chronic kidney disease ([place]) 02/17/2009  . ANXIETY 02/17/2009  . GERD 02/17/2009  . DIARRHEA, CHRONIC 02/17/2009  . Mixed hyperlipidemia 12/21/2008  . Essential hypertension, benign 12/21/2008   Thank you,  Genene Churn, [place]  [facility] 09/17/2017, 11:08 AM  [place] [address] [place], [place], [address] Phone: [phone]   Fax:  [phone]   Name: [name] MRN: [id number] Date of Birth: Jun 15, 1934

## 2017-09-17 NOTE — Therapy (Addendum)
[place], [place], [address] Phone: [phone]   Fax:  [phone]  Occupational Therapy Treatment  Patient Details  Name: [name] MRN: [id number] Date of Birth: 01/31/1935 Referring Provider: Dr. Caren Macadam   Encounter Date: 09/17/2017  OT End of Session - 09/17/17 1230    Visit Number  3    Number of Visits  4    Date for OT Re-Evaluation  10/03/17    Authorization Type  1) Medicare A & B 2) AARP    OT Start Time  1121    OT Stop Time  1144    OT Time Calculation (min)  23 min    Activity Tolerance  Patient tolerated treatment well    Behavior During Therapy  [facility] for tasks assessed/performed       Past Medical History:  Diagnosis Date  . Anxiety   . Asthma   . Chronic back pain   . Colonoscopy causing post-procedural bleeding    Incomplete. by Dr. [name] 06/11/99 due to fixed non-compliant colon precluded exam to 40 cm, she had subsequent colectomy for diverticulitis since that time.   . Diarrhea   . Esophageal reflux   . Essential hypertension, benign   . Fibromyalgia   . Hiatal hernia    Recent EGD/TCS showed small hiatal hernia, normal apperaring tubular esophagus. s/p passage of a 54-french Maloney dilator, s/p biopsy esophageal mucosa, multiple fundal gland type gastric polyps. one large pedunculated polp with oozing, noted s/p clipping and snare polypectomy. Biopsies of the gastric mucosa taken given her symptoms in her elevated count. normal D1-D3, s/p biospy D2-D3.   [name] Hiatal hernia    all bx unremarkable. on TCS she had left-sided diverticula, evidence of prior segmental resection with anastomosis, multiple colonic polyps s/p multiple snare polypectomies, s/p segmental biopsy and stool sampling. she had multiple tubular adenomas. no microscopic/collagenous colitis. stool culture, c diff, O+P, lactoferrin were negative.   . Mixed hyperlipidemia   . Nephrolithiasis   . OSA (obstructive  sleep apnea)   . Permanent atrial fibrillation (HCC)    Previous failed DCCV  . Type 2 diabetes mellitus ([place])   . Ureteral stent retained    Not retained. ureteral stent removal gross hematuria resolved 10/11. coumadin restarted.   . Ventral hernia     Past Surgical History:  Procedure Laterality Date  . BREAST LUMPECTOMY     benign cyst  . CATARACT EXTRACTION W/PHACO Left 08/17/2012   Procedure: CATARACT EXTRACTION PHACO AND INTRAOCULAR LENS PLACEMENT (IOC);  Surgeon: [name], MD;  Location: [facility];  Service: Ophthalmology;  Laterality: Left;  CDE:18.73  . COLECTOMY     2005. Dr. Geoffry Paradise for diverticulitis  . COLONOSCOPY  02/22/09   normal/left-sided diverticula/multiple colonic polyp, adenomatous  . COLONOSCOPY  12/16/2011   colonic polyps-treated as described above. Status postprior sigmoid resection. adenomatous. next TCS 12/2014  . COLONOSCOPY N/A 12/28/2014   Status post prior segmental resection. Few residual colonic diverticula- status post segmental biopsy negative random colon biopsies  . ESOPHAGEAL DILATION N/A 12/28/2014   Procedure: ESOPHAGEAL DILATION;  Surgeon: [name], MD;  Location: [facility];  Service: Endoscopy;  Laterality: N/A;  . ESOPHAGOGASTRODUODENOSCOPY  02/22/09   normal s/p dilator;small HH/one large pedunculates polyp  s/p clipping, hyperplastic  . ESOPHAGOGASTRODUODENOSCOPY  12/16/2011   [name]: small hiatal hernia. Hyperplastic apperating polyps. Status post [name] dilation as described above.  . ESOPHAGOGASTRODUODENOSCOPY N/A 12/28/2014   RMR: Normal-appearing esophagus  status post passage of a maloney dilator. Hiatal hernia. abnormal gastic mucosa of uncertain significance status post gastric biopsy with mild chronic gastritis, no H pylori.   . TONSILLECTOMY    . TOTAL ABDOMINAL HYSTERECTOMY      Vitals:   09/17/17 1223  Pulse:  Heart Rate:  Blood Sugar: 72  175/88  165    Subjective Assessment - 09/17/17 1223    Subjective    S: Last wednesday, the raised toilet seat slipped right out from under me and I fell back and hit my head. Since then I just haven't felt the same.    Currently in Pain?  Yes    Pain Score  5     Pain Location  Shoulder Pain also in head    Pain Orientation  Left    Pain Descriptors / Indicators  Aching;Sore    Pain Type  Chronic pain    Pain Radiating Towards  N/A    Pain Onset  More than a month ago    Pain Frequency  Intermittent    Aggravating Factors   movement    Pain Relieving Factors  pain medication    Effect of Pain on Daily Activities  max effect on ADL completion    Multiple Pain Sites  Yes                           OT Education - 09/17/17 1229    Education provided  Yes    Education Details  Patient educated on safety during toileting and on strategies to increase independence.    Person(s) Educated  Patient    Methods  Explanation    Comprehension  Verbalized understanding       OT Short Term Goals - 09/04/17 0749      OT SHORT TERM GOAL #1   Title  Pt will be provided with and educated on HEP to improve ability to participate in ADL tasks.     Time  4    Period  Weeks    Status  New    Target Date  10/04/17      OT SHORT TERM GOAL #2   Title  Pt will perform toilet transfers and hygiene tasks at supervision to modified independent level with use of AE/DME as needed.     Time  4    Period  Weeks    Status  New      OT SHORT TERM GOAL #3   Title  Pt will improve BUE strength to 5/5 while maintaining cervical precautions to improve ability to use BUE to perform transfers and wheelchair mobility tasks.     Time  4    Period  Weeks    Status  New      OT SHORT TERM GOAL #4   Title  Pt will improve bilateral grip strength by 10# and left pinch strength by 3# to improve ability to wash dishes.     Time  4    Period  Weeks    Status  New      OT SHORT TERM GOAL #5   Title  Pt will perform LB dressing tasks with supervision to min guard  assist using AE as needed to compensate for mobility limitations.     Time  4    Period  Weeks    Status  New               Plan -  09/17/17 1231    Clinical Impression Statement  A: Patient reports falling last wednesday and hitting her head when her raised toilet seat slipped out from under her. Since then she has felt dizzy and her vision has gotten worse. Patient yet to follow up with neurosurgeon as recommended. Patient reports she cannot don/doff socks and shoes and requires max assist from her son. Due to increased swelling in the feet, therapist did not attempt to address lower body dressing this session. Patient reports she has difficultly reaching back for toileting hygeine task. Patient educated on increasing independence for toileting including ensuring that toilet seat is secure and using wet wipes to save energy. Diona Foley pass activity attempted today but did not complete due to reports of dizziness after transfer to therapy table. Vitals taken. Patient presented with slightly elevated blood pressure and blood sugar (blood sugar at 153 this morning and 165 during session). Session ended early and patient instructed to follow up with MD.    Plan  P: Follow up with patient to see if she scheduled MD appointment. Simulate LB dressing tasks if swelling has gone down. Complete ball pass activity to simulate reaching back for toileting hygeine tasks.       Patient will benefit from skilled therapeutic intervention in order to improve the following deficits and impairments:  Impaired vision/preception, Decreased knowledge of precautions, Impaired perceived functional ability, Decreased activity tolerance, Decreased knowledge of use of DME, Decreased strength, Decreased coordination, Decreased safety awareness, Impaired UE functional use  Visit Diagnosis: S/P evacuation of subdural hematoma  Muscle weakness (generalized)  Other symptoms and signs involving the musculoskeletal  system    Problem List Patient Active Problem List   Diagnosis Date Noted  . RLQ abdominal pain 08/15/2016  . Diabetic complication ([place]) 02/54/2706  . Mucosal abnormality of stomach   . Diverticulosis of colon without hemorrhage   . Dysphagia, pharyngoesophageal phase   . Esophageal dysphagia 12/02/2014  . LLQ pain 02/22/2014  . Abdominal pain, epigastric 02/22/2014  . Encounter for therapeutic drug monitoring 05/18/2013  . Adenomatous polyps 11/21/2011  . Long term (current) use of anticoagulants 07/03/2010  . Permanent atrial fibrillation ([place]) 08/04/2009  . RENAL DISEASE, CHRONIC, STAGE III 08/04/2009  . IBS 04/14/2009  . Diabetes mellitus with stage 3 chronic kidney disease ([place]) 02/17/2009  . ANXIETY 02/17/2009  . GERD 02/17/2009  . DIARRHEA, CHRONIC 02/17/2009  . Mixed hyperlipidemia 12/21/2008  . Essential hypertension, benign 12/21/2008    Roderic Palau, OT student 09/17/2017, 1:01 PM  [facility] [address] [place], [place], [address] Phone: [phone]   Fax:  [phone]  Name: NADEA KIRKLAND MRN: [id number] Date of Birth: [birth year]

## 2017-09-17 NOTE — Telephone Encounter (Signed)
I received message in chart from patient's SLP at [facility]. Patient is requesting referral to neurosurgery in [place]. Please advise patient that referral has been placed. Please give her referral information. Please cancel the referral to [facility] Dr. Rigoberto Noel. [name]. [name], MD

## 2017-09-18 ENCOUNTER — Other Ambulatory Visit: Payer: Self-pay | Admitting: "Endocrinology

## 2017-09-18 NOTE — Telephone Encounter (Signed)
I have already re-routed this referral.

## 2017-09-19 ENCOUNTER — Ambulatory Visit (HOSPITAL_COMMUNITY): Payer: Medicare Other

## 2017-09-19 DIAGNOSIS — M6281 Muscle weakness (generalized): Secondary | ICD-10-CM

## 2017-09-19 DIAGNOSIS — R41841 Cognitive communication deficit: Secondary | ICD-10-CM | POA: Diagnosis not present

## 2017-09-19 DIAGNOSIS — R29898 Other symptoms and signs involving the musculoskeletal system: Secondary | ICD-10-CM

## 2017-09-19 DIAGNOSIS — Z9889 Other specified postprocedural states: Secondary | ICD-10-CM | POA: Diagnosis not present

## 2017-09-19 DIAGNOSIS — Z8679 Personal history of other diseases of the circulatory system: Secondary | ICD-10-CM | POA: Diagnosis not present

## 2017-09-19 NOTE — Therapy (Signed)
[place], [place], [address] Phone: [phone]   Fax:  [phone]  Physical Therapy Treatment  Patient Details  Name: [name] MRN: [id number] Date of Birth: 1934/08/27 Referring Provider: Dr. Caren Macadam   Encounter Date: 09/19/2017  PT End of Session - 09/19/17 1115    Visit Number  4    Number of Visits  17    Date for PT Re-Evaluation  10/31/17 Mini-reassessment: 10/01/17    Authorization Type  Primary: Medicare; Secondary: AARP    Authorization Time Period  09/03/17 - 10/31/17    Authorization - Visit Number  4    Authorization - Number of Visits  10    PT Start Time  1115    PT Stop Time  1157    PT Time Calculation (min)  42 min    Equipment Utilized During Treatment  Gait belt    Activity Tolerance  Patient tolerated treatment well;Patient limited by fatigue    Behavior During Therapy  [facility] for tasks assessed/performed       Past Medical History:  Diagnosis Date  . Anxiety   . Asthma   . Chronic back pain   . Colonoscopy causing post-procedural bleeding    Incomplete. by Dr. [name] 06/11/99 due to fixed non-compliant colon precluded exam to 40 cm, she had subsequent colectomy for diverticulitis since that time.   . Diarrhea   . Esophageal reflux   . Essential hypertension, benign   . Fibromyalgia   . Hiatal hernia    Recent EGD/TCS showed small hiatal hernia, normal apperaring tubular esophagus. s/p passage of a 54-french Maloney dilator, s/p biopsy esophageal mucosa, multiple fundal gland type gastric polyps. one large pedunculated polp with oozing, noted s/p clipping and snare polypectomy. Biopsies of the gastric mucosa taken given her symptoms in her elevated count. normal D1-D3, s/p biospy D2-D3.   [name] Hiatal hernia    all bx unremarkable. on TCS she had left-sided diverticula, evidence of prior segmental resection with anastomosis, multiple colonic polyps s/p multiple snare polypectomies,  s/p segmental biopsy and stool sampling. she had multiple tubular adenomas. no microscopic/collagenous colitis. stool culture, c diff, O+P, lactoferrin were negative.   . Mixed hyperlipidemia   . Nephrolithiasis   . OSA (obstructive sleep apnea)   . Permanent atrial fibrillation (HCC)    Previous failed DCCV  . Type 2 diabetes mellitus ([place])   . Ureteral stent retained    Not retained. ureteral stent removal gross hematuria resolved 10/11. coumadin restarted.   . Ventral hernia     Past Surgical History:  Procedure Laterality Date  . BREAST LUMPECTOMY     benign cyst  . CATARACT EXTRACTION W/PHACO Left 08/17/2012   Procedure: CATARACT EXTRACTION PHACO AND INTRAOCULAR LENS PLACEMENT (IOC);  Surgeon: [name], MD;  Location: [facility];  Service: Ophthalmology;  Laterality: Left;  CDE:18.73  . COLECTOMY     2005. Dr. Geoffry Paradise for diverticulitis  . COLONOSCOPY  02/22/09   normal/left-sided diverticula/multiple colonic polyp, adenomatous  . COLONOSCOPY  12/16/2011   colonic polyps-treated as described above. Status postprior sigmoid resection. adenomatous. next TCS 12/2014  . COLONOSCOPY N/A 12/28/2014   Status post prior segmental resection. Few residual colonic diverticula- status post segmental biopsy negative random colon biopsies  . ESOPHAGEAL DILATION N/A 12/28/2014   Procedure: ESOPHAGEAL DILATION;  Surgeon: [name], MD;  [facility];  Service: Endoscopy;  Laterality: N/A;  . ESOPHAGOGASTRODUODENOSCOPY  02/22/09   normal  s/p dilator;small HH/one large pedunculates polyp  s/p clipping, hyperplastic  . ESOPHAGOGASTRODUODENOSCOPY  12/16/2011   [name]: small hiatal hernia. Hyperplastic apperating polyps. Status post [name] dilation as described above.  . ESOPHAGOGASTRODUODENOSCOPY N/A 12/28/2014   RMR: Normal-appearing esophagus status post passage of a maloney dilator. Hiatal hernia. abnormal gastic mucosa of uncertain significance status post gastric biopsy with mild  chronic gastritis, no H pylori.   . TONSILLECTOMY    . TOTAL ABDOMINAL HYSTERECTOMY      There were no vitals filed for this visit.  Subjective Assessment - 09/19/17 1115    Subjective  Pt states that she feels much better compared to last time. She's c/o a bad HA which she states she usually keeps.     Patient is accompained by:  Family member Son in waiting room    Pertinent History  Fall down 21 steps, SDH and craniotomy to correct this on 07/26/17. C2 fractures. History of DMII and HTN.     Limitations  Walking;Standing;House hold activities    How long can you sit comfortably?  Not limited    How long can you stand comfortably?  Used to stand for a couple hours at a time. Couple minutes with support    How long can you walk comfortably?  20 feet    Patient Stated Goals  Get back to doing regular household activities    Currently in Pain?  Yes    Pain Score  4     Pain Location  Head    Pain Descriptors / Indicators  Aching    Pain Type  Chronic pain    Pain Onset  More than a month ago    Pain Frequency  Intermittent    Aggravating Factors   stress    Pain Relieving Factors  medicine    Effect of Pain on Daily Activities  increases            [facility] Adult PT Treatment/Exercise - 09/19/17 0001      Transfers   Transfers  Stand Pivot Transfers    Stand Pivot Transfers  5: Supervision    Stand Pivot Transfer Details (indicate cue type and reason)  with RW      Ambulation/Gait   Ambulation Distance (Feet)  106 Feet    Assistive device  Rolling walker    Gait Pattern  Step-through pattern;Decreased step length - left;Decreased step length - right;Decreased stride length;Decreased hip/knee flexion - right;Decreased hip/knee flexion - left;Decreased dorsiflexion - right;Decreased dorsiflexion - left    Ambulation Surface  Level    Gait Comments  improved but gait still slow and labored      Knee/Hip Exercises: Stretches   Passive Hamstring Stretch  Right;Left;3 reps;30  seconds;Other (comment)    Passive Hamstring Stretch Limitations  seated, LE on 8" step    Gastroc Stretch  Both;2 reps;30 seconds;Limitations    Gastroc Stretch Limitations  seated with rope      Knee/Hip Exercises: Standing   Other Standing Knee Exercises  sidestepping in front of mat table x2RT (BUE support on therapist)      Knee/Hip Exercises: Seated   Long Arc Quad  Strengthening;Right;Left;2 sets;10 reps    Long Arc Quad Weight  2 lbs.    Knee/Hip Flexion  seated on dyna disc: 3x10" EC, no UE, marching and LAQ x10 reps each    Marching  Strengthening;Both;2 sets;10 reps    Marching Limitations  2#    Abduction/Adduction   Strengthening;Right;Left;10 reps;3 sets  Abd/Adduction Limitations  RTB    Sit to Sand  2 sets;5 reps;without UE support           PT Education - 09/19/17 1115    Education Details  exercise technique, continue HEP    Person(s) Educated  Patient    Methods  Explanation;Demonstration    Comprehension  Verbalized understanding;Returned demonstration       PT Short Term Goals - 09/17/17 1139      PT SHORT TERM GOAL #1   Title  Patient will demonstrate understanding and report regular compliance with HEP in order to improve functional mobility and return to prior level of function.     Time  4    Period  Weeks    Status  On-going      PT SHORT TERM GOAL #2   Title  Patient will demonstrate ability to perform stand pivot transfer with no more than supervision assistance.     Time  4    Period  Weeks    Status  On-going      PT SHORT TERM GOAL #3   Title  Patient will demonstrate ability to ambulate 80 feet with least retrictive assistive device and no more than supervision assistance in order to perform household ambulation with improved safety.     Time  4    Period  Weeks    Status  On-going        PT Long Term Goals - 09/17/17 1140      PT LONG TERM GOAL #1   Title  Patient will demonstrate ability to ambulate 180 feet with least  retrictive assistive device and no more than supervision assistance in order to perform household ambulation with improved safety at a velocity of 0.8 m/s.     Time  8    Period  Weeks    Status  On-going      PT LONG TERM GOAL #2   Title  Patient will report ability to stand for 20 minutes in order to prepare a meal.     Time  8    Period  Weeks    Status  On-going      PT LONG TERM GOAL #3   Title  If cleared with spinal precautions, patient will demonstrate ability to perform bed mobility including rolling and supine to sit with no more than supervision assistance.     Time  8    Period  Weeks    Status  On-going            Plan - 09/19/17 1203    Clinical Impression Statement  Pt presents to therapy reporting feeling much improved from her last session. BP at beginning of session was 143/68. Continued with seated strengthening at EOB and progressed slightly by adding 2# ankle weights. Added sidestepping in front of mat table for hip strength and balance training. Able to perform STS without UE support this date. Ended session with gait training with RW. She had significant improvement in ambulation tolerance as she was able to ambulate 15ft this date with w/c follow. Pt reporting feeling better with decreased pain at EOS.    Rehab Potential  Fair    Clinical Impairments Affecting Rehab Potential  Positive: family support, motivated. Negative: Complexity of medical conditions    PT Frequency  2x / week    PT Duration  8 weeks    PT Treatment/Interventions  ADLs/Self Care Home Management;DME Instruction;Gait training;Stair training;Functional mobility training;Therapeutic activities;Therapeutic exercise;Balance training;Neuromuscular  re-education;Patient/family education;Orthotic Fit/Training;Wheelchair mobility training;Manual techniques;Passive range of motion;Energy conservation    PT Next Visit Plan  Follow-up about seeing Neurosurgeon, and continue to see if patient has started  HHPT. Continue with seated strengthening and sit to stands, gait training with RW, transfer training, and sidestepping in front of mat table    PT Home Exercise Plan  09/10/17 - Seated marching, toes raises, and heel raises; 09/17/17: long arc quads 2 x 10 repetitions each lower extremity 1x/day    Consulted and Agree with Plan of Care  Patient    Family Member Consulted  Patient's son       Patient will benefit from skilled therapeutic intervention in order to improve the following deficits and impairments:  Abnormal gait, Decreased balance, Decreased endurance, Decreased mobility, Difficulty walking, Dizziness, Improper body mechanics, Obesity, Decreased activity tolerance, Decreased knowledge of use of DME, Decreased strength, Postural dysfunction, Pain  Visit Diagnosis: S/P evacuation of subdural hematoma  Muscle weakness (generalized)  Other symptoms and signs involving the musculoskeletal system     Problem List Patient Active Problem List   Diagnosis Date Noted  . RLQ abdominal pain 08/15/2016  . Diabetic complication ([place]) 55/37/4827  . Mucosal abnormality of stomach   . Diverticulosis of colon without hemorrhage   . Dysphagia, pharyngoesophageal phase   . Esophageal dysphagia 12/02/2014  . LLQ pain 02/22/2014  . Abdominal pain, epigastric 02/22/2014  . Encounter for therapeutic drug monitoring 05/18/2013  . Adenomatous polyps 11/21/2011  . Long term (current) use of anticoagulants 07/03/2010  . Permanent atrial fibrillation ([place]) 08/04/2009  . RENAL DISEASE, CHRONIC, STAGE III 08/04/2009  . IBS 04/14/2009  . Diabetes mellitus with stage 3 chronic kidney disease ([place]) 02/17/2009  . ANXIETY 02/17/2009  . GERD 02/17/2009  . DIARRHEA, CHRONIC 02/17/2009  . Mixed hyperlipidemia 12/21/2008  . Essential hypertension, benign 12/21/2008       Geraldine Solar PT, DPT  [facility] [address] [place], [place], [address] Phone:  [phone]   Fax:  [phone]  Name: LATERICA MATARAZZO MRN: [id number] Date of Birth: December 30, 1934

## 2017-09-22 DIAGNOSIS — S12191A Other nondisplaced fracture of second cervical vertebra, initial encounter for closed fracture: Secondary | ICD-10-CM | POA: Diagnosis not present

## 2017-09-22 DIAGNOSIS — I10 Essential (primary) hypertension: Secondary | ICD-10-CM | POA: Diagnosis not present

## 2017-09-23 ENCOUNTER — Encounter (HOSPITAL_COMMUNITY): Payer: Self-pay | Admitting: Occupational Therapy

## 2017-09-23 ENCOUNTER — Ambulatory Visit (HOSPITAL_COMMUNITY): Payer: Medicare Other | Admitting: Occupational Therapy

## 2017-09-23 ENCOUNTER — Ambulatory Visit (HOSPITAL_COMMUNITY): Payer: Medicare Other

## 2017-09-23 DIAGNOSIS — R29898 Other symptoms and signs involving the musculoskeletal system: Secondary | ICD-10-CM

## 2017-09-23 DIAGNOSIS — Z9889 Other specified postprocedural states: Principal | ICD-10-CM

## 2017-09-23 DIAGNOSIS — R41841 Cognitive communication deficit: Secondary | ICD-10-CM | POA: Diagnosis not present

## 2017-09-23 DIAGNOSIS — M6281 Muscle weakness (generalized): Secondary | ICD-10-CM | POA: Diagnosis not present

## 2017-09-23 DIAGNOSIS — Z8679 Personal history of other diseases of the circulatory system: Secondary | ICD-10-CM | POA: Diagnosis not present

## 2017-09-23 NOTE — Therapy (Signed)
[place], [place], [address] Phone: [phone]   Fax:  [phone]  Occupational Therapy Treatment  Patient Details  Name: [name] MRN: [id number] Date of Birth: 11-04-1934 Referring Provider: Dr. Caren Macadam   Encounter Date: 09/23/2017  OT End of Session - 09/23/17 1815    Visit Number  4    Number of Visits  5    Date for OT Re-Evaluation  10/03/17    Authorization Type  1) Medicare A & B 2) AARP    OT Start Time  1350    OT Stop Time  1430    OT Time Calculation (min)  40 min    Activity Tolerance  Patient tolerated treatment well    Behavior During Therapy  [facility] for tasks assessed/performed       Past Medical History:  Diagnosis Date  . Anxiety   . Asthma   . Chronic back pain   . Colonoscopy causing post-procedural bleeding    Incomplete. by Dr. [name] 06/11/99 due to fixed non-compliant colon precluded exam to 40 cm, she had subsequent colectomy for diverticulitis since that time.   . Diarrhea   . Esophageal reflux   . Essential hypertension, benign   . Fibromyalgia   . Hiatal hernia    Recent EGD/TCS showed small hiatal hernia, normal apperaring tubular esophagus. s/p passage of a 54-french Maloney dilator, s/p biopsy esophageal mucosa, multiple fundal gland type gastric polyps. one large pedunculated polp with oozing, noted s/p clipping and snare polypectomy. Biopsies of the gastric mucosa taken given her symptoms in her elevated count. normal D1-D3, s/p biospy D2-D3.   [name] Hiatal hernia    all bx unremarkable. on TCS she had left-sided diverticula, evidence of prior segmental resection with anastomosis, multiple colonic polyps s/p multiple snare polypectomies, s/p segmental biopsy and stool sampling. she had multiple tubular adenomas. no microscopic/collagenous colitis. stool culture, c diff, O+P, lactoferrin were negative.   . Mixed hyperlipidemia   . Nephrolithiasis   . OSA (obstructive  sleep apnea)   . Permanent atrial fibrillation (HCC)    Previous failed DCCV  . Type 2 diabetes mellitus ([place])   . Ureteral stent retained    Not retained. ureteral stent removal gross hematuria resolved 10/11. coumadin restarted.   . Ventral hernia     Past Surgical History:  Procedure Laterality Date  . BREAST LUMPECTOMY     benign cyst  . CATARACT EXTRACTION W/PHACO Left 08/17/2012   Procedure: CATARACT EXTRACTION PHACO AND INTRAOCULAR LENS PLACEMENT (IOC);  Surgeon: [name], MD;  Location: [facility];  Service: Ophthalmology;  Laterality: Left;  CDE:18.73  . COLECTOMY     2005. Dr. Geoffry Paradise for diverticulitis  . COLONOSCOPY  02/22/09   normal/left-sided diverticula/multiple colonic polyp, adenomatous  . COLONOSCOPY  12/16/2011   colonic polyps-treated as described above. Status postprior sigmoid resection. adenomatous. next TCS 12/2014  . COLONOSCOPY N/A 12/28/2014   Status post prior segmental resection. Few residual colonic diverticula- status post segmental biopsy negative random colon biopsies  . ESOPHAGEAL DILATION N/A 12/28/2014   Procedure: ESOPHAGEAL DILATION;  Surgeon: [name], MD;  Location: [facility];  Service: Endoscopy;  Laterality: N/A;  . ESOPHAGOGASTRODUODENOSCOPY  02/22/09   normal s/p dilator;small HH/one large pedunculates polyp  s/p clipping, hyperplastic  . ESOPHAGOGASTRODUODENOSCOPY  12/16/2011   [name]: small hiatal hernia. Hyperplastic apperating polyps. Status post [name] dilation as described above.  . ESOPHAGOGASTRODUODENOSCOPY N/A 12/28/2014   RMR: Normal-appearing esophagus  status post passage of a maloney dilator. Hiatal hernia. abnormal gastic mucosa of uncertain significance status post gastric biopsy with mild chronic gastritis, no H pylori.   . TONSILLECTOMY    . TOTAL ABDOMINAL HYSTERECTOMY      There were no vitals filed for this visit.  Subjective Assessment - 09/23/17 1354    Subjective   S: I went to the neurosurgeon yesterday & saw  the PA.     Currently in Pain?  Yes    Pain Score  6     Pain Location  Head    Pain Orientation  Mid;Anterior    Pain Descriptors / Indicators  Headache    Pain Type  Chronic pain    Pain Radiating Towards  n/a    Pain Onset  More than a month ago    Pain Frequency  Intermittent    Aggravating Factors   stress    Pain Relieving Factors  medicine    Effect of Pain on Daily Activities  increases with ADLs    Multiple Pain Sites  No         [facility] OT Assessment - 09/23/17 1354      Assessment   Medical Diagnosis  s/p SDH      Precautions   Precautions  Fall;Cervical    Precaution Comments  C-Collar to wear 24/7 (has another collar for bathing)    Required Braces or Orthoses  Cervical Brace    Cervical Brace  Hard collar;At all times      Restrictions   Weight Bearing Restrictions  No               OT Treatments/Exercises (OP) - 09/23/17 1358      Exercises   Exercises  Hand;Theraputty;Shoulder      Shoulder Exercises: Seated   Protraction  Theraband;10 reps    Theraband Level (Shoulder Protraction)  Level 2 (Red)    Horizontal ABduction  Theraband;10 reps    Theraband Level (Shoulder Horizontal ABduction)  Level 2 (Red)    External Rotation  Theraband;10 reps    Theraband Level (Shoulder External Rotation)  Level 2 (Red)      Shoulder Exercises: Therapy Ball   Other Therapy Ball Exercises  green ball: chest press, 10X      Hand Exercises   Hand Gripper with Large Beads  left: all beads gripper at 29#; right: all beads gripper at 29#    Hand Gripper with Medium Beads  left: all beads gripper at 25#; right: all beads gripper at 25#               OT Short Term Goals - 09/17/17 1532      OT SHORT TERM GOAL #1   Title  Pt will be provided with and educated on HEP to improve ability to participate in ADL tasks.     Time  4    Period  Weeks    Status  On-going      OT SHORT TERM GOAL #2   Title  Pt will perform toilet transfers and hygiene tasks  at supervision to modified independent level with use of AE/DME as needed.     Time  4    Period  Weeks    Status  On-going      OT SHORT TERM GOAL #3   Title  Pt will improve BUE strength to 5/5 while maintaining cervical precautions to improve ability to use BUE to perform transfers and wheelchair mobility tasks.  Time  4    Period  Weeks    Status  On-going      OT SHORT TERM GOAL #4   Title  Pt will improve bilateral grip strength by 10# and left pinch strength by 3# to improve ability to wash dishes.     Time  4    Period  Weeks    Status  On-going      OT SHORT TERM GOAL #5   Title  Pt will perform LB dressing tasks with supervision to min guard assist using AE as needed to compensate for mobility limitations.     Time  4    Period  Weeks    Status  On-going               Plan - 09/23/17 1816    Clinical Impression Statement  A: Pt reporting she went to a neurosurgeon yesterday and saw a PA, they did an x-ray and told her she has a long way to go. Pt does not remember name of MD or PA and goes back on 10/17/17. Pt reporting she is able to dress herself and complete toileting tasks, however her sons won't let her try by herself, minimal motivation to complete. Pt preferring to focus on BUE strengthening today, pt unable to increase gripper strength and requires rest breaks frequently. Added BUE strengthening with theraband at shoulder height and updated HEP. Verbal cuing for form and technique during session.     Plan  P: ask if pt brought name of neurosurgeon. Reassess and determine if need to hold therapy or continue       Patient will benefit from skilled therapeutic intervention in order to improve the following deficits and impairments:  Impaired vision/preception, Decreased knowledge of precautions, Impaired perceived functional ability, Decreased activity tolerance, Decreased knowledge of use of DME, Decreased strength, Decreased coordination, Decreased safety  awareness, Impaired UE functional use  Visit Diagnosis: Muscle weakness (generalized)  Other symptoms and signs involving the musculoskeletal system    Problem List Patient Active Problem List   Diagnosis Date Noted  . RLQ abdominal pain 08/15/2016  . Diabetic complication ([place]) 94/85/4627  . Mucosal abnormality of stomach   . Diverticulosis of colon without hemorrhage   . Dysphagia, pharyngoesophageal phase   . Esophageal dysphagia 12/02/2014  . LLQ pain 02/22/2014  . Abdominal pain, epigastric 02/22/2014  . Encounter for therapeutic drug monitoring 05/18/2013  . Adenomatous polyps 11/21/2011  . Long term (current) use of anticoagulants 07/03/2010  . Permanent atrial fibrillation ([place]) 08/04/2009  . RENAL DISEASE, CHRONIC, STAGE III 08/04/2009  . IBS 04/14/2009  . Diabetes mellitus with stage 3 chronic kidney disease ([place]) 02/17/2009  . ANXIETY 02/17/2009  . GERD 02/17/2009  . DIARRHEA, CHRONIC 02/17/2009  . Mixed hyperlipidemia 12/21/2008  . Essential hypertension, benign 12/21/2008   Guadelupe Sabin, OTR/L  [phone] 09/23/2017, 6:19 PM  [place] [address] [place], [place], [address] Phone: [phone]   Fax:  [phone]  Name: EVEE LISKA MRN: [id number] Date of Birth: [birth year]

## 2017-09-23 NOTE — Therapy (Signed)
[place], [place], [address] Phone: [phone]   Fax:  [phone]  Physical Therapy Treatment  Patient Details  Name: [name] MRN: [id number] Date of Birth: 04-21-1934 Referring Provider: Dr. Caren Macadam   Encounter Date: 09/23/2017  PT End of Session - 09/23/17 1436    Visit Number  5    Number of Visits  17    Date for PT Re-Evaluation  10/31/17 Mini-reassess 10/01/17    Authorization Type  Primary: Medicare; Secondary: AARP    Authorization Time Period  09/03/17 - 10/31/17    Authorization - Visit Number  5    Authorization - Number of Visits  10    PT Start Time  1601    PT Stop Time  1517    PT Time Calculation (min)  43 min    Equipment Utilized During Treatment  Gait belt    Activity Tolerance  Patient tolerated treatment well;Patient limited by fatigue    Behavior During Therapy  [facility] for tasks assessed/performed       Past Medical History:  Diagnosis Date  . Anxiety   . Asthma   . Chronic back pain   . Colonoscopy causing post-procedural bleeding    Incomplete. by Dr. [name] 06/11/99 due to fixed non-compliant colon precluded exam to 40 cm, she had subsequent colectomy for diverticulitis since that time.   . Diarrhea   . Esophageal reflux   . Essential hypertension, benign   . Fibromyalgia   . Hiatal hernia    Recent EGD/TCS showed small hiatal hernia, normal apperaring tubular esophagus. s/p passage of a 54-french Maloney dilator, s/p biopsy esophageal mucosa, multiple fundal gland type gastric polyps. one large pedunculated polp with oozing, noted s/p clipping and snare polypectomy. Biopsies of the gastric mucosa taken given her symptoms in her elevated count. normal D1-D3, s/p biospy D2-D3.   [name] Hiatal hernia    all bx unremarkable. on TCS she had left-sided diverticula, evidence of prior segmental resection with anastomosis, multiple colonic polyps s/p multiple snare polypectomies, s/p  segmental biopsy and stool sampling. she had multiple tubular adenomas. no microscopic/collagenous colitis. stool culture, c diff, O+P, lactoferrin were negative.   . Mixed hyperlipidemia   . Nephrolithiasis   . OSA (obstructive sleep apnea)   . Permanent atrial fibrillation (HCC)    Previous failed DCCV  . Type 2 diabetes mellitus ([place])   . Ureteral stent retained    Not retained. ureteral stent removal gross hematuria resolved 10/11. coumadin restarted.   . Ventral hernia     Past Surgical History:  Procedure Laterality Date  . BREAST LUMPECTOMY     benign cyst  . CATARACT EXTRACTION W/PHACO Left 08/17/2012   Procedure: CATARACT EXTRACTION PHACO AND INTRAOCULAR LENS PLACEMENT (IOC);  Surgeon: [name], MD;  Location: [facility];  Service: Ophthalmology;  Laterality: Left;  CDE:18.73  . COLECTOMY     2005. Dr. Geoffry Paradise for diverticulitis  . COLONOSCOPY  02/22/09   normal/left-sided diverticula/multiple colonic polyp, adenomatous  . COLONOSCOPY  12/16/2011   colonic polyps-treated as described above. Status postprior sigmoid resection. adenomatous. next TCS 12/2014  . COLONOSCOPY N/A 12/28/2014   Status post prior segmental resection. Few residual colonic diverticula- status post segmental biopsy negative random colon biopsies  . ESOPHAGEAL DILATION N/A 12/28/2014   Procedure: ESOPHAGEAL DILATION;  Surgeon: [name], MD;  Location: [facility];  Service: Endoscopy;  Laterality: N/A;  . ESOPHAGOGASTRODUODENOSCOPY  02/22/09   normal  s/p dilator;small HH/one large pedunculates polyp  s/p clipping, hyperplastic  . ESOPHAGOGASTRODUODENOSCOPY  12/16/2011   [name]: small hiatal hernia. Hyperplastic apperating polyps. Status post [name] dilation as described above.  . ESOPHAGOGASTRODUODENOSCOPY N/A 12/28/2014   RMR: Normal-appearing esophagus status post passage of a maloney dilator. Hiatal hernia. abnormal gastic mucosa of uncertain significance status post gastric biopsy with mild chronic  gastritis, no H pylori.   . TONSILLECTOMY    . TOTAL ABDOMINAL HYSTERECTOMY      There were no vitals filed for this visit.  Subjective Assessment - 09/23/17 1429    Subjective  Pt went to neurosurgeon yesterdaty, only saw PA and took xrays.  Stated she is healing but has a long ways to go, has another apt set up 10/17/17.  Pt stated she has headache today frontal between eye, has taken 2 tylonel prior session today.  Stated her vision impaired with headaches, pressure    Pertinent History  Fall down 21 steps, SDH and craniotomy to correct this on 07/26/17. C2 fractures. History of DMII and HTN.     Patient Stated Goals  Get back to doing regular household activities    Currently in Pain?  Yes    Pain Score  6     Pain Location  Head    Pain Orientation  -- frontal    Pain Descriptors / Indicators  Headache;Pressure    Pain Type  Chronic pain    Pain Onset  More than a month ago    Pain Frequency  Intermittent    Aggravating Factors   stress    Pain Relieving Factors  medicine    Effect of Pain on Daily Activities  increases with ADLs                       [facility] Adult PT Treatment/Exercise - 09/23/17 1455      Ambulation/Gait   Ambulation Distance (Feet)  226 Feet 1 seated rest break at halfway point 113 ft x 2    Assistive device  Rolling walker    Gait Pattern  Step-through pattern;Decreased step length - left;Decreased step length - right;Decreased stride length;Decreased hip/knee flexion - right;Decreased hip/knee flexion - left;Decreased dorsiflexion - right;Decreased dorsiflexion - left    Ambulation Surface  Level;Indoor    Gait Comments  cueing to pick up Database administrator  Yes    Comments  Pt educated on how to lock/unlock braces, moving WC with foot and UE A.      Knee/Hip Exercises: Standing   Heel Raises  20 reps;Limitations    Heel Raises Limitations  toe raises 10x incline slope    Other Standing Knee Exercises   sidestepping in front of mat table x2RT (BUE support on therapist)      Knee/Hip Exercises: Seated   Long Arc Quad  Strengthening;Right;Left;2 sets;10 reps    Long Arc Quad Weight  2 lbs.    Knee/Hip Flexion  seated on dyna disc: 3x10" EC, no UE, marching and LAQ x10 reps each    Other Seated Knee/Hip Exercises  heel raises x 20 reps, bil LE, 3 second holds    Other Seated Knee/Hip Exercises  encouraged to use UE and LE to get around in [facility] for strengthening    Marching  Strengthening;Both;2 sets;10 reps    Marching Limitations  2#    Abduction/Adduction   Strengthening;Both;10 reps;3 sets;Limitations    Abd/Adduction Limitations  RTB  Sit to Sand  10 reps;without UE support                        PT Short Term Goals - 09/17/17 1139      PT SHORT TERM GOAL #1   Title  Patient will demonstrate understanding and report regular compliance with HEP in order to improve functional mobility and return to prior level of function.     Time  4    Period  Weeks    Status  On-going      PT SHORT TERM GOAL #2   Title  Patient will demonstrate ability to perform stand pivot transfer with no more than supervision assistance.     Time  4    Period  Weeks    Status  On-going      PT SHORT TERM GOAL #3   Title  Patient will demonstrate ability to ambulate 80 feet with least retrictive assistive device and no more than supervision assistance in order to perform household ambulation with improved safety.     Time  4    Period  Weeks    Status  On-going        PT Long Term Goals - 09/17/17 1140      PT LONG TERM GOAL #1   Title  Patient will demonstrate ability to ambulate 180 feet with least retrictive assistive device and no more than supervision assistance in order to perform household ambulation with improved safety at a velocity of 0.8 m/s.     Time  8    Period  Weeks    Status  On-going      PT LONG TERM GOAL #2   Title  Patient will report ability to stand for 20  minutes in order to prepare a meal.     Time  8    Period  Weeks    Status  On-going      PT LONG TERM GOAL #3   Title  If cleared with spinal precautions, patient will demonstrate ability to perform bed mobility including rolling and supine to sit with no more than supervision assistance.     Time  8    Period  Weeks    Status  On-going            Plan - 09/23/17 1802    Clinical Impression Statement  Began session educating pt to improve mobility with WC, pt unaware of brakes and manevering to increase I ADLs.  Session focus on LE strengthening, balalnce and gait mechanics.  Pt with improved activity tolerance with ability to ambulate 113 ft prior need for rest break, use of RW and min guard wiht no LOB episodes and good mechanics, just cueing to pick feet up to reduce risk of fall.  Progressed functional strengthening with increased reps prior rest breaks as well as standing heel/toe raises for functional gait strengthening.  Continued with seated balance training on dynadisc and no UE/LE assistance as well as LE strengthening wiht 2#.  No reports of increased pain, was limited by fatigue wiht activities.      Rehab Potential  Fair    Clinical Impairments Affecting Rehab Potential  Positive: family support, motivated. Negative: Complexity of medical conditions    PT Frequency  2x / week    PT Duration  8 weeks    PT Treatment/Interventions  ADLs/Self Care Home Management;DME Instruction;Gait training;Stair training;Functional mobility training;Therapeutic activities;Therapeutic exercise;Balance training;Neuromuscular re-education;Patient/family education;Orthotic Fit/Training;Wheelchair mobility  training;Manual techniques;Passive range of motion;Energy conservation    PT Next Visit Plan  Follow-up about seeing Neurosurgeon, and continue to see if patient has started HHPT. Continue with seated strengthening and sit to stands, gait training with RW, transfer training, and sidestepping in  front of mat table  Increase standing LE strengthening activities as able.      PT Home Exercise Plan  09/10/17 - Seated marching, toes raises, and heel raises; 09/17/17: long arc quads 2 x 10 repetitions each lower extremity 1x/day    Consulted and Agree with Plan of Care  Patient    Family Member Consulted  Patient's son       Patient will benefit from skilled therapeutic intervention in order to improve the following deficits and impairments:  Abnormal gait, Decreased balance, Decreased endurance, Decreased mobility, Difficulty walking, Dizziness, Improper body mechanics, Obesity, Decreased activity tolerance, Decreased knowledge of use of DME, Decreased strength, Postural dysfunction, Pain  Visit Diagnosis: S/P evacuation of subdural hematoma  Muscle weakness (generalized)  Other symptoms and signs involving the musculoskeletal system     Problem List Patient Active Problem List   Diagnosis Date Noted  . RLQ abdominal pain 08/15/2016  . Diabetic complication ([place]) 76/19/5093  . Mucosal abnormality of stomach   . Diverticulosis of colon without hemorrhage   . Dysphagia, pharyngoesophageal phase   . Esophageal dysphagia 12/02/2014  . LLQ pain 02/22/2014  . Abdominal pain, epigastric 02/22/2014  . Encounter for therapeutic drug monitoring 05/18/2013  . Adenomatous polyps 11/21/2011  . Long term (current) use of anticoagulants 07/03/2010  . Permanent atrial fibrillation ([place]) 08/04/2009  . RENAL DISEASE, CHRONIC, STAGE III 08/04/2009  . IBS 04/14/2009  . Diabetes mellitus with stage 3 chronic kidney disease ([place]) 02/17/2009  . ANXIETY 02/17/2009  . GERD 02/17/2009  . DIARRHEA, CHRONIC 02/17/2009  . Mixed hyperlipidemia 12/21/2008  . Essential hypertension, benign 12/21/2008   Ihor Austin, [place]; [place]  Aldona Lento 09/23/2017, 6:10 PM  [place], [place], [address] Phone:  [phone]   Fax:  [phone]  Name: TABBITHA JANVRIN MRN: [id number] Date of Birth: Sep 28, 1934

## 2017-09-23 NOTE — Patient Instructions (Signed)
1) While standing hold the ends of the elastic band in each hand with the band wrapped around your shoulders. Keep your shoulders back and squared to start. Slowly push your arms forward while rounding your shoulders, hold and go back to the start position.      2) While holding an elastic band with your elbows straight and in front of your body, pull your arms apart and towards the side.     3) While holding an elastic band with your elbows bent, pull your hands away from your stomach area. Keep  your elbows near the side of your body.

## 2017-09-24 ENCOUNTER — Ambulatory Visit (HOSPITAL_COMMUNITY): Payer: Self-pay | Admitting: Physical Therapy

## 2017-09-24 ENCOUNTER — Encounter (HOSPITAL_COMMUNITY): Payer: Self-pay | Admitting: Speech Pathology

## 2017-09-24 ENCOUNTER — Encounter (HOSPITAL_COMMUNITY): Payer: Self-pay | Admitting: Specialist

## 2017-09-25 ENCOUNTER — Telehealth: Payer: Self-pay | Admitting: Family Medicine

## 2017-09-25 DIAGNOSIS — M5416 Radiculopathy, lumbar region: Secondary | ICD-10-CM | POA: Diagnosis not present

## 2017-09-25 DIAGNOSIS — Z79891 Long term (current) use of opiate analgesic: Secondary | ICD-10-CM | POA: Diagnosis not present

## 2017-09-25 DIAGNOSIS — M25511 Pain in right shoulder: Secondary | ICD-10-CM | POA: Diagnosis not present

## 2017-09-25 DIAGNOSIS — M545 Low back pain: Secondary | ICD-10-CM | POA: Diagnosis not present

## 2017-09-25 NOTE — Telephone Encounter (Signed)
Merrilee Seashore w/ [facility] left voicemail that he found a homehealth agency for patient near her home. Cb#: [phone]/ [id number]

## 2017-09-26 ENCOUNTER — Encounter (HOSPITAL_COMMUNITY): Payer: Self-pay | Admitting: Physical Therapy

## 2017-09-26 ENCOUNTER — Ambulatory Visit (HOSPITAL_COMMUNITY): Payer: Medicare Other | Admitting: Physical Therapy

## 2017-09-26 DIAGNOSIS — R41841 Cognitive communication deficit: Secondary | ICD-10-CM | POA: Diagnosis not present

## 2017-09-26 DIAGNOSIS — R29898 Other symptoms and signs involving the musculoskeletal system: Secondary | ICD-10-CM | POA: Diagnosis not present

## 2017-09-26 DIAGNOSIS — Z9889 Other specified postprocedural states: Secondary | ICD-10-CM | POA: Diagnosis not present

## 2017-09-26 DIAGNOSIS — Z8679 Personal history of other diseases of the circulatory system: Secondary | ICD-10-CM | POA: Diagnosis not present

## 2017-09-26 DIAGNOSIS — M6281 Muscle weakness (generalized): Secondary | ICD-10-CM

## 2017-09-26 NOTE — Therapy (Signed)
[place], [place], [address] Phone: [phone]   Fax:  [phone]  Physical Therapy Treatment  Patient Details  Name: [name] MRN: [id number] Date of Birth: [birth year] Referring Provider: Dr. Caren Macadam   Encounter Date: 09/26/2017  PT End of Session - 09/26/17 1128    Visit Number  6    Number of Visits  17    Date for PT Re-Evaluation  10/31/17 Mini-reassess 10/01/17    Authorization Type  Primary: Medicare; Secondary: [employer]    Authorization Time Period  09/03/17 - 10/31/17    Authorization - Visit Number  6    Authorization - Number of Visits  10    PT Start Time  1113    PT Stop Time  1155    PT Time Calculation (min)  42 min    Equipment Utilized During Treatment  Gait belt    Activity Tolerance  Patient tolerated treatment well;Patient limited by fatigue    Behavior During Therapy  [facility] for tasks assessed/performed       Past Medical History:  Diagnosis Date  . Anxiety   . Asthma   . Chronic back pain   . Colonoscopy causing post-procedural bleeding    Incomplete. by Dr. [name] 06/11/99 due to fixed non-compliant colon precluded exam to 40 cm, she had subsequent colectomy for diverticulitis since that time.   . Diarrhea   . Esophageal reflux   . Essential hypertension, benign   . Fibromyalgia   . Hiatal hernia    Recent EGD/TCS showed small hiatal hernia, normal apperaring tubular esophagus. s/p passage of a 54-french Maloney dilator, s/p biopsy esophageal mucosa, multiple fundal gland type gastric polyps. one large pedunculated polp with oozing, noted s/p clipping and snare polypectomy. Biopsies of the gastric mucosa taken given her symptoms in her elevated count. normal D1-D3, s/p biospy D2-D3.   [name] Hiatal hernia    all bx unremarkable. on TCS she had left-sided diverticula, evidence of prior segmental resection with anastomosis, multiple colonic polyps s/p multiple snare polypectomies, s/p  segmental biopsy and stool sampling. she had multiple tubular adenomas. no microscopic/collagenous colitis. stool culture, c diff, O+P, lactoferrin were negative.   . Mixed hyperlipidemia   . Nephrolithiasis   . OSA (obstructive sleep apnea)   . Permanent atrial fibrillation (HCC)    Previous failed DCCV  . Type 2 diabetes mellitus ([place])   . Ureteral stent retained    Not retained. ureteral stent removal gross hematuria resolved 10/11. coumadin restarted.   . Ventral hernia     Past Surgical History:  Procedure Laterality Date  . BREAST LUMPECTOMY     benign cyst  . CATARACT EXTRACTION W/PHACO Left 08/17/2012   Procedure: CATARACT EXTRACTION PHACO AND INTRAOCULAR LENS PLACEMENT (IOC);  Surgeon: [name], MD;  Location: [facility];  Service: Ophthalmology;  Laterality: Left;  CDE:18.73  . COLECTOMY     2005. Dr. Geoffry Paradise for diverticulitis  . COLONOSCOPY  02/22/09   normal/left-sided diverticula/multiple colonic polyp, adenomatous  . COLONOSCOPY  12/16/2011   colonic polyps-treated as described above. Status postprior sigmoid resection. adenomatous. next TCS 12/2014  . COLONOSCOPY N/A 12/28/2014   Status post prior segmental resection. Few residual colonic diverticula- status post segmental biopsy negative random colon biopsies  . ESOPHAGEAL DILATION N/A 12/28/2014   Procedure: ESOPHAGEAL DILATION;  Surgeon: [name], MD;  Location: [facility];  Service: Endoscopy;  Laterality: N/A;  . ESOPHAGOGASTRODUODENOSCOPY  02/22/09   normal  s/p dilator;small HH/one large pedunculates polyp  s/p clipping, hyperplastic  . ESOPHAGOGASTRODUODENOSCOPY  12/16/2011   [name]: small hiatal hernia. Hyperplastic apperating polyps. Status post [name] dilation as described above.  . ESOPHAGOGASTRODUODENOSCOPY N/A 12/28/2014   RMR: Normal-appearing esophagus status post passage of a maloney dilator. Hiatal hernia. abnormal gastic mucosa of uncertain significance status post gastric biopsy with mild chronic  gastritis, no H pylori.   . TONSILLECTOMY    . TOTAL ABDOMINAL HYSTERECTOMY      There were no vitals filed for this visit.  Subjective Assessment - 09/26/17 1126    Subjective  Patient stated that she has been trying to do leg exercises at home. She stated that she is having a headache today, but that is usual for her. Patient denied any dizziness.     Pertinent History  Fall down 21 steps, SDH and craniotomy to correct this on 07/26/17. C2 fractures. History of DMII and HTN.     Patient Stated Goals  Get back to doing regular household activities    Currently in Pain?  Yes    Pain Score  7     Pain Location  Head    Pain Orientation  Other (Comment) Frontal    Pain Descriptors / Indicators  Headache    Pain Type  Chronic pain    Pain Onset  More than a month ago         [facility] PT Assessment - 09/26/17 0001      Ambulation/Gait   Ambulation Distance (Feet)  136 Feet    Assistive device  Rolling walker    Gait Pattern  Step-through pattern;Decreased step length - left;Decreased step length - right;Decreased stride length;Decreased hip/knee flexion - right;Decreased hip/knee flexion - left;Decreased dorsiflexion - right;Decreased dorsiflexion - left    Ambulation Surface  Level;Indoor    Gait Comments  Verbal cues for heel to toe gait. No rest break.                    [facility] PT Treatment/Exercise - 09/26/17 0001      Transfers   Transfers  Sit to Stand    Sit to Stand  5: Supervision    Sit to Stand Details  Verbal cues for sequencing    Sit to Stand Details (indicate cue type and reason)  Verbal cues for technique to push up from wheelchair before reaching for walker when ambulating      Knee/Hip Exercises: Standing   Heel Raises  20 reps;Limitations    Hip Extension  Stengthening;Right;Left;1 set;10 reps;Knee straight;Other (comment) Bilateral upper extremity support    Other Standing Knee Exercises  Sidestepping inside parallel bars 8 feet x 2 round trips  bilateral upper extremity support      Knee/Hip Exercises: Seated   Long Arc Quad  Strengthening;Right;Left;2 sets;10 reps    Long Arc Quad Weight  2 lbs.    Knee/Hip Flexion  seated on dyna disc marching: 3x 10 repetitions with 3'' holds alternating lower extremities, no UE    Marching  Strengthening;Both;2 sets;10 reps    Marching Limitations  2#    Abduction/Adduction   Strengthening;Both;10 reps;3 sets;Limitations    Abd/Adduction Limitations  Red theraband    Sit to Sand  10 reps;without UE support from standard height chair             PT Education - 09/26/17 1128    Education Details  Educated patient on exercise technique and purpose as well as progression with gait  training distance this session.     Person(s) Educated  Patient    Methods  Explanation;Verbal cues    Comprehension  Verbalized understanding;Returned demonstration       PT Short Term Goals - 09/17/17 1139      PT SHORT TERM GOAL #1   Title  Patient will demonstrate understanding and report regular compliance with HEP in order to improve functional mobility and return to prior level of function.     Time  4    Period  Weeks    Status  On-going      PT SHORT TERM GOAL #2   Title  Patient will demonstrate ability to perform stand pivot transfer with no more than supervision assistance.     Time  4    Period  Weeks    Status  On-going      PT SHORT TERM GOAL #3   Title  Patient will demonstrate ability to ambulate 80 feet with least retrictive assistive device and no more than supervision assistance in order to perform household ambulation with improved safety.     Time  4    Period  Weeks    Status  On-going        PT Long Term Goals - 09/17/17 1140      PT LONG TERM GOAL #1   Title  Patient will demonstrate ability to ambulate 180 feet with least retrictive assistive device and no more than supervision assistance in order to perform household ambulation with improved safety at a velocity of  0.8 m/s.     Time  8    Period  Weeks    Status  On-going      PT LONG TERM GOAL #2   Title  Patient will report ability to stand for 20 minutes in order to prepare a meal.     Time  8    Period  Weeks    Status  On-going      PT LONG TERM GOAL #3   Title  If cleared with spinal precautions, patient will demonstrate ability to perform bed mobility including rolling and supine to sit with no more than supervision assistance.     Time  8    Period  Weeks    Status  On-going            Plan - 09/26/17 1206    Clinical Impression Statement  This session continued to progress patient with gait training, balance activities, and lower extremity strengthening. This session added standing hip extension strengthening exercise with patient using bilateral upper extremities for support. This session also transitioned to patient performing sidestepping inside of the parallel bars with verbal cues to keep toes pointing forward. This session patient demonstrated ability to ambulate 136 feet without a rest break which is the furthest she ambulated continuously in therapy. Patient denied dizziness throughout session and reported that she felt okay after each exercise or activity. Patient would benefit from continued skilled physical therapy in order to continue progressing patient toward functional goals.     Rehab Potential  Fair    Clinical Impairments Affecting Rehab Potential  Positive: family support, motivated. Negative: Complexity of medical conditions    PT Frequency  2x / week    PT Duration  8 weeks    PT Treatment/Interventions  ADLs/Self Care Home Management;DME Instruction;Gait training;Stair training;Functional mobility training;Therapeutic activities;Therapeutic exercise;Balance training;Neuromuscular re-education;Patient/family education;Orthotic Fit/Training;Wheelchair mobility training;Manual techniques;Passive range of motion;Energy conservation    PT Next Visit Plan  Follow-up about  seeing Neurosurgeon, and continue to see if patient has started HHPT. Continue with seated strengthening and sit to stands, gait training with RW, transfer training, and sidestepping in front of mat table  Increase standing LE strengthening activities as able.     PT Home Exercise Plan  09/10/17 - Seated marching, toes raises, and heel raises; 09/17/17: long arc quads 2 x 10 repetitions each lower extremity 1x/day; 09/26/17: seated hip abduction with red theraband 2 x 10 3x/week    Consulted and Agree with Plan of Care  Patient    Family Member Consulted  --       Patient will benefit from skilled therapeutic intervention in order to improve the following deficits and impairments:  Abnormal gait, Decreased balance, Decreased endurance, Decreased mobility, Difficulty walking, Dizziness, Improper body mechanics, Obesity, Decreased activity tolerance, Decreased knowledge of use of DME, Decreased strength, Postural dysfunction, Pain  Visit Diagnosis: S/P evacuation of subdural hematoma  Muscle weakness (generalized)  Other symptoms and signs involving the musculoskeletal system     Problem List Patient Active Problem List   Diagnosis Date Noted  . RLQ abdominal pain 08/15/2016  . Diabetic complication ([place]) 98/26/4158  . Mucosal abnormality of stomach   . Diverticulosis of colon without hemorrhage   . Dysphagia, pharyngoesophageal phase   . Esophageal dysphagia 12/02/2014  . LLQ pain 02/22/2014  . Abdominal pain, epigastric 02/22/2014  . Encounter for therapeutic drug monitoring 05/18/2013  . Adenomatous polyps 11/21/2011  . Long term (current) use of anticoagulants 07/03/2010  . Permanent atrial fibrillation ([place]) 08/04/2009  . RENAL DISEASE, CHRONIC, STAGE III 08/04/2009  . IBS 04/14/2009  . Diabetes mellitus with stage 3 chronic kidney disease ([place]) 02/17/2009  . ANXIETY 02/17/2009  . GERD 02/17/2009  . DIARRHEA, CHRONIC 02/17/2009  . Mixed hyperlipidemia 12/21/2008  . Essential  hypertension, benign 12/21/2008   Clarene Critchley PT, DPT 12:12 PM, 09/26/17 [place], [place], [address] Phone: [phone]   Fax:  [phone]  Name: POLETTE NOFSINGER MRN: [id number] Date of Birth: [birth year]

## 2017-09-26 NOTE — Telephone Encounter (Signed)
Left a vm thanking Merrilee Seashore for helping find an agency to help patient with her home health needs.

## 2017-09-30 ENCOUNTER — Encounter: Payer: Self-pay | Admitting: *Deleted

## 2017-09-30 DIAGNOSIS — D509 Iron deficiency anemia, unspecified: Secondary | ICD-10-CM | POA: Diagnosis not present

## 2017-09-30 DIAGNOSIS — R809 Proteinuria, unspecified: Secondary | ICD-10-CM | POA: Diagnosis not present

## 2017-09-30 DIAGNOSIS — I129 Hypertensive chronic kidney disease with stage 1 through stage 4 chronic kidney disease, or unspecified chronic kidney disease: Secondary | ICD-10-CM | POA: Diagnosis not present

## 2017-09-30 DIAGNOSIS — E559 Vitamin D deficiency, unspecified: Secondary | ICD-10-CM | POA: Diagnosis not present

## 2017-09-30 DIAGNOSIS — Z79899 Other long term (current) drug therapy: Secondary | ICD-10-CM | POA: Diagnosis not present

## 2017-09-30 DIAGNOSIS — N183 Chronic kidney disease, stage 3 (moderate): Secondary | ICD-10-CM | POA: Diagnosis not present

## 2017-10-01 ENCOUNTER — Ambulatory Visit (HOSPITAL_COMMUNITY): Payer: Medicare Other

## 2017-10-01 ENCOUNTER — Encounter (HOSPITAL_COMMUNITY): Payer: Self-pay | Admitting: Speech Pathology

## 2017-10-01 ENCOUNTER — Encounter (HOSPITAL_COMMUNITY): Payer: Self-pay | Admitting: Occupational Therapy

## 2017-10-01 ENCOUNTER — Ambulatory Visit (HOSPITAL_COMMUNITY): Payer: Medicare Other | Admitting: Occupational Therapy

## 2017-10-01 ENCOUNTER — Telehealth: Payer: Self-pay | Admitting: Family Medicine

## 2017-10-01 ENCOUNTER — Encounter: Payer: Self-pay | Admitting: Cardiology

## 2017-10-01 ENCOUNTER — Ambulatory Visit (HOSPITAL_COMMUNITY): Payer: Medicare Other | Admitting: Speech Pathology

## 2017-10-01 ENCOUNTER — Ambulatory Visit (INDEPENDENT_AMBULATORY_CARE_PROVIDER_SITE_OTHER): Payer: Medicare Other | Admitting: Cardiology

## 2017-10-01 VITALS — BP 140/62 | HR 87 | Ht 63.0 in | Wt 210.4 lb

## 2017-10-01 DIAGNOSIS — R29898 Other symptoms and signs involving the musculoskeletal system: Secondary | ICD-10-CM | POA: Diagnosis not present

## 2017-10-01 DIAGNOSIS — M6281 Muscle weakness (generalized): Secondary | ICD-10-CM | POA: Diagnosis not present

## 2017-10-01 DIAGNOSIS — Z8679 Personal history of other diseases of the circulatory system: Secondary | ICD-10-CM | POA: Diagnosis not present

## 2017-10-01 DIAGNOSIS — Z9889 Other specified postprocedural states: Secondary | ICD-10-CM | POA: Diagnosis not present

## 2017-10-01 DIAGNOSIS — I639 Cerebral infarction, unspecified: Secondary | ICD-10-CM | POA: Diagnosis not present

## 2017-10-01 DIAGNOSIS — R41841 Cognitive communication deficit: Secondary | ICD-10-CM

## 2017-10-01 DIAGNOSIS — E782 Mixed hyperlipidemia: Secondary | ICD-10-CM

## 2017-10-01 DIAGNOSIS — I10 Essential (primary) hypertension: Secondary | ICD-10-CM | POA: Diagnosis not present

## 2017-10-01 DIAGNOSIS — I4821 Permanent atrial fibrillation: Secondary | ICD-10-CM

## 2017-10-01 DIAGNOSIS — I482 Chronic atrial fibrillation: Secondary | ICD-10-CM

## 2017-10-01 NOTE — Telephone Encounter (Signed)
Patient is requesting a call from you regarding a disk you discussed Cb#: [phone]

## 2017-10-01 NOTE — Patient Instructions (Signed)
Medication Instructions:  Your physician recommends that you continue on your current medications as directed. Please refer to the Current Medication list given to you today.  Labwork: NONE  Testing/Procedures: Your physician has requested that you have an echocardiogram. Echocardiography is a painless test that uses sound waves to create images of your heart. It provides your doctor with information about the size and shape of your heart and how well your heart's chambers and valves are working. This procedure takes approximately one hour. There are no restrictions for this procedure.  Follow-Up: Your physician recommends that you schedule a follow-up appointment in: 4-6 WEEKS WITH DR. MCDOWELL  Any Other Special Instructions Will Be Listed Below (If Applicable).  If you need a refill on your cardiac medications before your next appointment, please call your pharmacy. 

## 2017-10-01 NOTE — Progress Notes (Addendum)
Cardiology Office Note  Date: 10/01/2017   ID: Natalija, Mavis [birth year], MRN [id number]  PCP: Caren Macadam, MD  Primary Cardiologist: [name], MD   Chief Complaint  Patient presents with  . Atrial Fibrillation    History of Present Illness: [name] is an 83 y.o. female that I last saw in August 2018.  Patient had a fall while visiting family in [place] in May of this year resulting in a subdural hematoma requiring surgical evacuation and also fracture of C2 and C7.  She had been on Coumadin at that time for stroke prophylaxis with atrial fibrillation, this has subsequently been discontinued.  She is wearing a cervical collar and undergoing neurosurgical assessment with Dr. Ronnald Ramp in [place] at this point.  She is to follow-up in July to review additional imaging studies.  She tells me that she was visiting family in [place], was in her bedroom which had 2 doors adjacent to one another, one being the bathroom, and the other being a stairwell to the cellar.  Unfortunately, it sounds like she fell down 21 steps in the stairwell.  CHADSVASC score is 5.  I did talk with her about the possibility of eventually resuming anticoagulation presuming her neurological injuries heal and she requires no further invasive evaluation.  Since this subdural hematoma was caused by trauma on anticoagulation rather than a spontaneous bleeding event, it would not necessarily be a long-term contraindication to anticoagulation.  I personally reviewed her ECG today which showed rate controlled atrial fibrillation with poor R wave progression, low voltage, and nonspecific ST changes.  He has had some lower leg and ankle edema, very sedentary at this time which could be related.  She does not have any history of cardiomyopathy.  Past Medical History:  Diagnosis Date  . Anxiety   . Asthma   . Chronic back pain   . Colonoscopy causing post-procedural bleeding    Incomplete. by Dr.  [name] 06/11/99 due to fixed non-compliant colon precluded exam to 40 cm, she had subsequent colectomy for diverticulitis since that time.   . Diarrhea   . Esophageal reflux   . Essential hypertension   . Fibromyalgia   . Hiatal hernia    Recent EGD/TCS showed small hiatal hernia, normal apperaring tubular esophagus. s/p passage of a 54-french Maloney dilator, s/p biopsy esophageal mucosa, multiple fundal gland type gastric polyps. one large pedunculated polp with oozing, noted s/p clipping and snare polypectomy. Biopsies of the gastric mucosa taken given her symptoms in her elevated count. normal D1-D3, s/p biospy D2-D3.   [name] Hiatal hernia    all bx unremarkable. on TCS she had left-sided diverticula, evidence of prior segmental resection with anastomosis, multiple colonic polyps s/p multiple snare polypectomies, s/p segmental biopsy and stool sampling. she had multiple tubular adenomas. no microscopic/collagenous colitis. stool culture, c diff, O+P, lactoferrin were negative.   . Mixed hyperlipidemia   . Nephrolithiasis   . OSA (obstructive sleep apnea)   . Permanent atrial fibrillation (HCC)    Previous failed DCCV  . Type 2 diabetes mellitus ([place])   . Ureteral stent retained    Not retained. ureteral stent removal gross hematuria resolved 10/11. coumadin restarted.   . Ventral hernia     Past Surgical History:  Procedure Laterality Date  . BREAST LUMPECTOMY     benign cyst  . CATARACT EXTRACTION W/PHACO Left 08/17/2012   Procedure: CATARACT EXTRACTION PHACO AND INTRAOCULAR LENS PLACEMENT (IOC);  Surgeon: [name], MD;  Location:  [facility];  Service: Ophthalmology;  Laterality: Left;  CDE:18.73  . COLECTOMY     2005. Dr. Geoffry Paradise for diverticulitis  . COLONOSCOPY  02/22/09   normal/left-sided diverticula/multiple colonic polyp, adenomatous  . COLONOSCOPY  12/16/2011   colonic polyps-treated as described above. Status postprior sigmoid resection. adenomatous. next TCS 12/2014  .  COLONOSCOPY N/A 12/28/2014   Status post prior segmental resection. Few residual colonic diverticula- status post segmental biopsy negative random colon biopsies  . ESOPHAGEAL DILATION N/A 12/28/2014   Procedure: ESOPHAGEAL DILATION;  Surgeon: [name], MD;  Location: [facility];  Service: Endoscopy;  Laterality: N/A;  . ESOPHAGOGASTRODUODENOSCOPY  02/22/09   normal s/p dilator;small HH/one large pedunculates polyp  s/p clipping, hyperplastic  . ESOPHAGOGASTRODUODENOSCOPY  12/16/2011   [name]: small hiatal hernia. Hyperplastic apperating polyps. Status post [name] dilation as described above.  . ESOPHAGOGASTRODUODENOSCOPY N/A 12/28/2014   RMR: Normal-appearing esophagus status post passage of a maloney dilator. Hiatal hernia. abnormal gastic mucosa of uncertain significance status post gastric biopsy with mild chronic gastritis, no H pylori.   . TONSILLECTOMY    . TOTAL ABDOMINAL HYSTERECTOMY      Current Outpatient Medications  Medication Sig Dispense Refill  . acetaminophen (TYLENOL) 325 MG tablet Take 650 mg by mouth every 6 (six) hours as needed for pain.     [name] ALPRAZolam (XANAX) 0.5 MG tablet Take 0.5 mg by mouth 2 (two) times daily. *May take one and one-half tablet at bedtime as needed    . amLODipine (NORVASC) 2.5 MG tablet Take 1 tablet (2.5 mg total) by mouth daily. 30 tablet 1  . Continuous Blood Gluc Sensor (FREESTYLE LIBRE SENSOR SYSTEM) MISC Use one sensor every 10 days. 3 each 2  . Insulin Glargine (LANTUS SOLOSTAR) 100 UNIT/ML Solostar Pen INJECT 20 UNITS INTO THE SKIN DAILY AT BREAKFAST (Patient taking differently: Inject 15 Units into the skin every morning. ) 15 mL 2  . insulin lispro (HUMALOG) 100 UNIT/ML injection Inject 3 Units into the skin. Sliding scale    . metoprolol tartrate (LOPRESSOR) 25 MG tablet TAKE 1 TABLET BY MOUTH TWICE DAILY - Has MD appt scheduled (Patient taking differently: Take 25 mg by mouth 2 (two) times daily. ) 60 tablet 1  . nitroGLYCERIN  (NITROSTAT) 0.4 MG SL tablet PLACE 1 TABLET UNDER THE TONGUE EVERY 5 MINUTES FOR 3 DOSES AS NEEDED FOR CHEST PAIN. IF YOU HAVE NO RELIEF AFTER THE 3RD DOSE PROCEED TO TH 90 tablet 3  . ondansetron (ZOFRAN-ODT) 4 MG disintegrating tablet Take 4 mg by mouth every 8 (eight) hours as needed. Nausea and Vomiting    . Oxycodone HCl 10 MG TABS Take 10 mg by mouth every 4 (four) hours.   0  . pantoprazole (PROTONIX) 40 MG tablet TAKE ONE TABLET BY MOUTH TWICE DAILY 60 tablet 5  . polyethylene glycol (MIRALAX / GLYCOLAX) packet Take 17 g by mouth as needed.     . simvastatin (ZOCOR) 20 MG tablet Take 1 tablet by mouth daily.    . tizanidine (ZANAFLEX) 2 MG capsule Take 2 mg by mouth 3 (three) times daily as needed for muscle spasms.     . Vitamin D, Ergocalciferol, (DRISDOL) 50000 units CAPS capsule Take 50,000 Units by mouth once a week.  6   No current facility-administered medications for this visit.    Allergies:  Adhesive [tape]; Demerol [meperidine]; Iohexol; Meperidine hcl; Shellfish allergy; and Penicillins   Social History: The patient  reports that she has never smoked.  She has never used smokeless tobacco. She reports that she does not drink alcohol or use drugs.   ROS:  Please see the history of present illness. Otherwise, complete review of systems is positive for leg swelling.  All other systems are reviewed and negative.   Physical Exam: VS:  BP 140/62   Pulse 87   Ht 5\' 3"  (1.6 m)   Wt 210 lb 6.4 oz (95.4 kg)   SpO2 97%   BMI 37.27 kg/m , BMI Body mass index is 37.27 kg/m.  Wt Readings from Last 3 Encounters:  10/01/17 210 lb 6.4 oz (95.4 kg)  09/11/17 212 lb (96.2 kg)  09/10/17 212 lb (96.2 kg)    General: Elderly woman with cervical collar seated in wheelchair. HEENT: Conjunctiva and lids normal, oropharynx clear. Neck: Cervical collar in place. Lungs: Clear to auscultation, nonlabored breathing at rest. Cardiac: Irregularly irregular, no S3 or significant systolic  murmur. Abdomen: Soft, nontender, bowel sounds present. Extremities: 1-2+ lower leg and ankle edema, distal pulses 2+. Skin: Warm and dry. Musculoskeletal: No kyphosis. Neuropsychiatric: Alert and oriented x3, affect grossly appropriate.  ECG: I personally reviewed the tracing from  05/31/2016 which showed rate-controlled atrial fibrillation with low voltage.  Recent Labwork: 09/04/2017: ALT 8; AST 11; BUN 17; Creat 1.07; Potassium 4.3; Sodium 139; TSH 2.47     Component Value Date/Time   CHOL 123 09/04/2017 0851   TRIG 116 09/04/2017 0851   HDL 33 (L) 09/04/2017 0851   CHOLHDL 3.7 09/04/2017 0851   VLDL 35 07/11/2009 2342   LDLCALC 70 09/04/2017 0851    Other Studies Reviewed Today:  Head and neck CT 09/11/2017: IMPRESSION: 1. Postsurgical changes of recent craniotomy, likely for hematoma evacuation. No acute intracranial abnormality. 2. Type 3 dens fracture traversing both transverse foramina and minimally displaced fracture of the C7 transverse process. These results were discussed by telephone at the time of interpretation on 09/12/2017 at 12:48 am with Dr. Rolland Porter. By history, these fractures occurred approximately 3 weeks ago and were evaluated at an outside institution in [place].  Assessment and Plan:  1.  Permanent atrial fibrillation with CHADSVASC score of 5.  At this point she is off Coumadin following a fall in May with subdural hematoma and cervical fractures discussed above.  Holding off on resuming anticoagulation at this time pending further neurosurgical assessment by Dr. Ronnald Ramp.  Presuming she heals and does not require further surgical evaluation, I suspect she will be able to resume anticoagulation ultimately if cleared by neurosurgery.  We may consider a DOAC instead however.  2.  Bilateral lower leg edema.  Could be contributed to by a relatively sedentary state recently since her fall, also on low-dose Norvasc now.  We will obtain an echocardiogram however to  ensure stability in LVEF.  3.  Mixed hyperlipidemia on Zocor.  4.  Essential hypertension, tolerating low-dose Norvasc.  Current medicines were reviewed with the patient today.   Orders Placed This Encounter  Procedures  . EKG 12-Lead    Disposition: Follow-up in 4 to 6 weeks.  Signed, [name], MD, [facility] 10/01/2017 2:20 PM    [facility], [place], [place] [address] Phone: [phone]; Fax: [phone]

## 2017-10-01 NOTE — Therapy (Signed)
[place], [place], [address] Phone: [phone]   Fax:  [phone]  Speech Language Pathology Treatment  Patient Details  Name: [name] MRN: [id number] Date of Birth: 08/30/1934 Referring Provider: Caren Macadam, MD   Encounter Date: 10/01/2017  End of Session - 10/01/17 1100    Visit Number  4    Number of Visits  4    Date for SLP Re-Evaluation  10/09/17    Authorization Type  Medicare and AARP secondary    SLP Start Time  [phone]    SLP Stop Time   1031    SLP Time Calculation (min)  43 min    Activity Tolerance  Patient tolerated treatment well       Past Medical History:  Diagnosis Date  . Anxiety   . Asthma   . Chronic back pain   . Colonoscopy causing post-procedural bleeding    Incomplete. by Dr. [name] 06/11/99 due to fixed non-compliant colon precluded exam to 40 cm, she had subsequent colectomy for diverticulitis since that time.   . Diarrhea   . Esophageal reflux   . Essential hypertension   . Fibromyalgia   . Hiatal hernia    Recent EGD/TCS showed small hiatal hernia, normal apperaring tubular esophagus. s/p passage of a 54-french Maloney dilator, s/p biopsy esophageal mucosa, multiple fundal gland type gastric polyps. one large pedunculated polp with oozing, noted s/p clipping and snare polypectomy. Biopsies of the gastric mucosa taken given her symptoms in her elevated count. normal D1-D3, s/p biospy D2-D3.   [name] Hiatal hernia    all bx unremarkable. on TCS she had left-sided diverticula, evidence of prior segmental resection with anastomosis, multiple colonic polyps s/p multiple snare polypectomies, s/p segmental biopsy and stool sampling. she had multiple tubular adenomas. no microscopic/collagenous colitis. stool culture, c diff, O+P, lactoferrin were negative.   . Mixed hyperlipidemia   . Nephrolithiasis   . OSA (obstructive sleep apnea)   . Permanent atrial fibrillation (HCC)    Previous failed DCCV  . Type 2 diabetes mellitus ([place])   . Ureteral stent retained    Not retained. ureteral stent removal gross hematuria resolved 10/11. coumadin restarted.   . Ventral hernia     Past Surgical History:  Procedure Laterality Date  . BREAST LUMPECTOMY     benign cyst  . CATARACT EXTRACTION W/PHACO Left 08/17/2012   Procedure: CATARACT EXTRACTION PHACO AND INTRAOCULAR LENS PLACEMENT (IOC);  Surgeon: [name], MD;  Location: [facility];  Service: Ophthalmology;  Laterality: Left;  CDE:18.73  . COLECTOMY     2005. Dr. Geoffry Paradise for diverticulitis  . COLONOSCOPY  02/22/09   normal/left-sided diverticula/multiple colonic polyp, adenomatous  . COLONOSCOPY  12/16/2011   colonic polyps-treated as described above. Status postprior sigmoid resection. adenomatous. next TCS 12/2014  . COLONOSCOPY N/A 12/28/2014   Status post prior segmental resection. Few residual colonic diverticula- status post segmental biopsy negative random colon biopsies  . ESOPHAGEAL DILATION N/A 12/28/2014   Procedure: ESOPHAGEAL DILATION;  Surgeon: [name], MD;  Location: [facility];  Service: Endoscopy;  Laterality: N/A;  . ESOPHAGOGASTRODUODENOSCOPY  02/22/09   normal s/p dilator;small HH/one large pedunculates polyp  s/p clipping, hyperplastic  . ESOPHAGOGASTRODUODENOSCOPY  12/16/2011   [name]: small hiatal hernia. Hyperplastic apperating polyps. Status post [name] dilation as described above.  . ESOPHAGOGASTRODUODENOSCOPY N/A 12/28/2014   RMR: Normal-appearing esophagus status post passage of a maloney dilator. Hiatal hernia. abnormal gastic mucosa of uncertain significance  status post gastric biopsy with mild chronic gastritis, no H pylori.   . TONSILLECTOMY    . TOTAL ABDOMINAL HYSTERECTOMY      There were no vitals filed for this visit.  Subjective Assessment - 10/01/17 1046    Subjective  "I almost fell again this morning."    Patient is accompained by:  Family member    Currently in Pain?   No/denies       ADULT SLP TREATMENT - 10/01/17 0001      General Information   Behavior/Cognition  Alert;Cooperative;Pleasant mood    Patient Positioning  Upright in chair    Oral care provided  N/A    HPI  Pt is an 83 y/o female s/p SDH sustained after a fall down 21 stairs while visiting sister in [place]. Pt underwent a right fronto-temporoparietal craniotomy to decompress on 07/26/17. Pt also sustained a C2 cervical fracture with ligament injuries, [place] brace applied and pt reports she is to wear 24/7 for 8-12 weeks, of which she is 6 weeks out. Of note pt has not been established with neurosurgeon to follow up with in [place]. Pt received IP rehab at [facility] from 5/1-5/17. Pt was referred to speech pathology for evaluation and treatment by Dr. Agapito Games.      Treatment Provided   Treatment provided  Cognitive-Linquistic      Pain Assessment   Pain Assessment  No/denies pain      Cognitive-Linquistic Treatment   Treatment focused on  Cognition;Patient/family/caregiver education;Aphasia    Skilled Treatment  problem solving and organization related to functional tasks      Assessment / Recommendations / Plan   Plan  Continue with current plan of care      Progression Toward Goals   Progression toward goals  Goals met, education completed, patient discharged from [facility] - 10/01/17 1053    Education Details  discussed D/C from SLP services with Pt and spouse    Person(s) Educated  Patient    Methods  Explanation    Comprehension  Verbalized understanding       SLP Short Term Goals - 10/01/17 1101      SLP SHORT TERM GOAL #1   Title  Pt will implement memory strategies in functional therapy activities with 90% acc with min cues.    Baseline  60%    Time  4    Period  Weeks    Status  Achieved      SLP SHORT TERM GOAL #2   Title  Pt will complete selective and alternating attention tasks (moderately complex) with 90% acc and min cues.    Baseline   min/mod cues and extra time to complete    Time  4    Period  Weeks    Status  Achieved      SLP SHORT TERM GOAL #3   Title  Pt will implement word-finding strategies with 90% accuracy when unable to verbalize desired word in conversation/functional tasks with min assist.    Baseline  Pt with circumlocutions at times in conversation    Time  4    Period  Weeks    Status  Achieved       SLP Long Term Goals - 10/01/17 [place] #1   Title  Same as short term goals above       Plan - 10/01/17 1101    Clinical Impression  Statement Pt's son accompanied her to therapy this date. She reports that she saw someone from [facility] and will go back again in July. Pt also indicates that she almost fell this morning when trying to get up to her walker at home. She does not seem to impulsive, but is wanting to do things independently. Pt, SLP, and son discussed importance of only transferring when her son is nearby and able to assist. She spoke to her other son about taking on more household responsibilities (washing dishes, taking out trash), however he only inconsistently helps with this. Pt met all short term goals above, but continues to be frustrated by her physical limitations from her neck injury and cervical collar. Pt travels quite a distance to come to therapy and continues to explore whether she will try to pursue home health therapy. SLP encouraged Pt to discuss with PT this date and also correct transfer sequence. Pt will be discharged from SLP services this date.    Speech Therapy Frequency  1x /week    Duration  4 weeks    Treatment/Interventions  Cognitive reorganization;SLP instruction and feedback;Internal/external aids;Compensatory strategies;Compensatory techniques;Patient/family education;Functional tasks;Cueing hierarchy    Potential to Achieve Goals  Good    Potential Considerations  Financial resources ? vision changes vs. damaged glasses  for reading    SLP Home Exercise Plan  Pt will complete HEP as assigned to facilitate carryover of treatment strategies and techniques in home environment with assist from family.    Consulted and Agree with Plan of Care  Patient;Family member/caregiver    Family Member Consulted  Son, Jeneen Rinks       Patient will benefit from skilled therapeutic intervention in order to improve the following deficits and impairments:   Cognitive communication deficit    Problem List Patient Active Problem List   Diagnosis Date Noted  . RLQ abdominal pain 08/15/2016  . Diabetic complication ([place]) 28/36/6294  . Mucosal abnormality of stomach   . Diverticulosis of colon without hemorrhage   . Dysphagia, pharyngoesophageal phase   . Esophageal dysphagia 12/02/2014  . LLQ pain 02/22/2014  . Abdominal pain, epigastric 02/22/2014  . Encounter for therapeutic drug monitoring 05/18/2013  . Adenomatous polyps 11/21/2011  . Long term (current) use of anticoagulants 07/03/2010  . Permanent atrial fibrillation ([place]) 08/04/2009  . RENAL DISEASE, CHRONIC, STAGE III 08/04/2009  . IBS 04/14/2009  . Diabetes mellitus with stage 3 chronic kidney disease ([place]) 02/17/2009  . ANXIETY 02/17/2009  . GERD 02/17/2009  . DIARRHEA, CHRONIC 02/17/2009  . Mixed hyperlipidemia 12/21/2008  . Essential hypertension, benign 12/21/2008   SPEECH THERAPY DISCHARGE SUMMARY  Visits from Start of Care: 4  Current functional level related to goals / functional outcomes: Goals met   Remaining deficits: Mild attention deficits negatively impacting working Management consultant / Equipment: Completed; Pt with HEP folder to assist with organization at home  Plan: Patient agrees to discharge.  Patient goals were met. Patient is being discharged due to meeting the stated rehab goals.  ?????         Thank you,  Genene Churn, [place]  [facility] 10/01/2017, 11:02 AM  [place], [place], [address] Phone: [phone]   Fax:  [phone]   Name: TALON WITTING MRN: [id number] Date of Birth: Oct 05, 1934

## 2017-10-01 NOTE — Therapy (Signed)
[place], [place], [address] Phone: [phone]   Fax:  [phone]  Occupational Therapy Reassessment, Treatment, Discharge Summary  Patient Details  Name: [name] MRN: [id number] Date of Birth: 12-04-1934 Referring Provider: Dr. Caren Macadam   Progress Note Reporting Period 09/03/2017 to 10/01/2017  See note below for Objective Data and Assessment of Progress/Goals.       Encounter Date: 10/01/2017  OT End of Session - 10/01/17 1139    Visit Number  5    Number of Visits  5    Date for OT Re-Evaluation  10/03/17    Authorization Type  1) Medicare A & B 2) AARP    OT Start Time  1032    OT Stop Time  1114    OT Time Calculation (min)  42 min    Activity Tolerance  Patient tolerated treatment well    Behavior During Therapy  WFL for tasks assessed/performed       Past Medical History:  Diagnosis Date  . Anxiety   . Asthma   . Chronic back pain   . Colonoscopy causing post-procedural bleeding    Incomplete. by Dr. [name] 06/11/99 due to fixed non-compliant colon precluded exam to 40 cm, she had subsequent colectomy for diverticulitis since that time.   . Diarrhea   . Esophageal reflux   . Essential hypertension   . Fibromyalgia   . Hiatal hernia    Recent EGD/TCS showed small hiatal hernia, normal apperaring tubular esophagus. s/p passage of a 54-french Maloney dilator, s/p biopsy esophageal mucosa, multiple fundal gland type gastric polyps. one large pedunculated polp with oozing, noted s/p clipping and snare polypectomy. Biopsies of the gastric mucosa taken given her symptoms in her elevated count. normal D1-D3, s/p biospy D2-D3.   [name] Hiatal hernia    all bx unremarkable. on TCS she had left-sided diverticula, evidence of prior segmental resection with anastomosis, multiple colonic polyps s/p multiple snare polypectomies, s/p segmental biopsy and stool sampling. she had multiple tubular  adenomas. no microscopic/collagenous colitis. stool culture, c diff, O+P, lactoferrin were negative.   . Mixed hyperlipidemia   . Nephrolithiasis   . OSA (obstructive sleep apnea)   . Permanent atrial fibrillation (HCC)    Previous failed DCCV  . Type 2 diabetes mellitus ([place])   . Ureteral stent retained    Not retained. ureteral stent removal gross hematuria resolved 10/11. coumadin restarted.   . Ventral hernia     Past Surgical History:  Procedure Laterality Date  . BREAST LUMPECTOMY     benign cyst  . CATARACT EXTRACTION W/PHACO Left 08/17/2012   Procedure: CATARACT EXTRACTION PHACO AND INTRAOCULAR LENS PLACEMENT (IOC);  Surgeon: [name], MD;  Location: [facility];  Service: Ophthalmology;  Laterality: Left;  CDE:18.73  . COLECTOMY     2005. Dr. Geoffry Paradise for diverticulitis  . COLONOSCOPY  02/22/09   normal/left-sided diverticula/multiple colonic polyp, adenomatous  . COLONOSCOPY  12/16/2011   colonic polyps-treated as described above. Status postprior sigmoid resection. adenomatous. next TCS 12/2014  . COLONOSCOPY N/A 12/28/2014   Status post prior segmental resection. Few residual colonic diverticula- status post segmental biopsy negative random colon biopsies  . ESOPHAGEAL DILATION N/A 12/28/2014   Procedure: ESOPHAGEAL DILATION;  Surgeon: [name], MD;  Location: [facility];  Service: Endoscopy;  Laterality: N/A;  . ESOPHAGOGASTRODUODENOSCOPY  02/22/09   normal s/p dilator;small HH/one large pedunculates polyp  s/p clipping, hyperplastic  . ESOPHAGOGASTRODUODENOSCOPY  12/16/2011  [name]: small hiatal hernia. Hyperplastic apperating polyps. Status post [name] dilation as described above.  . ESOPHAGOGASTRODUODENOSCOPY N/A 12/28/2014   RMR: Normal-appearing esophagus status post passage of a maloney dilator. Hiatal hernia. abnormal gastic mucosa of uncertain significance status post gastric biopsy with mild chronic gastritis, no H pylori.   . TONSILLECTOMY    . TOTAL  ABDOMINAL HYSTERECTOMY      There were no vitals filed for this visit.  Subjective Assessment - 10/01/17 1054    Subjective   S: They found an agency for home health who will come to my house.     Currently in Pain?  No/denies         [facility] OT Assessment - 10/01/17 1039      Assessment   Medical Diagnosis  s/p SDH      Precautions   Precautions  Fall;Cervical    Precaution Comments  C-Collar to wear 24/7 (has another collar for bathing)    Required Braces or Orthoses  Cervical Brace    Cervical Brace  Hard collar;At all times      Restrictions   Weight Bearing Restrictions  No      Strength   Left Elbow Flexion  4+/5 same as previous    Left Elbow Extension  4/5 4+/5 previous    Right Hand Gross Grasp  Functional    Right Hand Grip (lbs)  35 32 previous    Right Hand Lateral Pinch  11 lbs 20 previous    Right Hand 3 Point Pinch  9 lbs 8 previous    Left Hand Gross Grasp  Functional    Left Hand Grip (lbs)  29 25 previous    Left Hand Lateral Pinch  11 lbs 5 previous    Left Hand 3 Point Pinch  7 lbs 5 previous               OT Treatments/Exercises (OP) - 10/01/17 1050      ADLs   LB Dressing  Pt described LB dressing technique currently using. Pt does not want sons assisting with dressing tasks, they stand outside the door while she performs dressing tasks. Pt uses reacher to thread feet through pants/underwear, pulls up to thighs while sitting, then stands in walker and pulls over hips.     ADL Comments  Discussed current ADL performance with pt, educated on techniques and AE to use. Pt is currently trying to obtain [facility] services, per chart review MD has located a [facility] agency for pt however she has not been set up yet. Pt has had multiple near falls at home due to weakness, would like [facility] staff to help train sons on correct way to assist her.       Exercises   Exercises  Hand;Theraputty;Shoulder      Hand Exercises   Hand Gripper with Large Beads  left: all beads  gripper at 29#; right: all beads gripper at 29#    Hand Gripper with Medium Beads  left: all beads gripper at 25#; right: all beads gripper at 29#    Other Hand Exercises  Pt used 3 point pinch with left hand to grasp and place green/blue/black clothespins along horizontal bars. Pt then removed with right hand 3 point pinch.                OT Short Term Goals - 10/01/17 1046      OT SHORT TERM GOAL #1   Title  Pt will be provided with and educated  on HEP to improve ability to participate in ADL tasks.     Time  4    Period  Weeks    Status  Achieved      OT SHORT TERM GOAL #2   Title  Pt will perform toilet transfers and hygiene tasks at supervision to modified independent level with use of AE/DME as needed.     Time  4    Period  Weeks    Status  Partially Met      OT SHORT TERM GOAL #3   Title  Pt will improve BUE strength to 5/5 while maintaining cervical precautions to improve ability to use BUE to perform transfers and wheelchair mobility tasks.     Time  4    Period  Weeks    Status  Not Met      OT SHORT TERM GOAL #4   Title  Pt will improve bilateral grip strength by 10# and left pinch strength by 3# to improve ability to wash dishes.     Time  4    Period  Weeks    Status  Partially Met      OT SHORT TERM GOAL #5   Title  Pt will perform LB dressing tasks with supervision to min guard assist using AE as needed to compensate for mobility limitations.     Time  4    Period  Weeks    Status  Achieved               Plan - 10/01/17 1139    Clinical Impression Statement  A: Pt reports she saw Dr. Adah Salvage PA from neurosurgery and is scheduled to return on 7/15. Pt also reports her MD has potentially found a [facility] agency who will come to her house for rehab services. Reassessment completed today, pt has met 2/4 goals, has also partially met additional 2/4 goals. Pt has improved her bilateral grip and pinch strength slightly, still unable to test bilateral  shoulder strength due to not knowing current cervical precautions. Discussed current limitations and functioning during ADL completion with pt and reviewed HEP. Pt is agreeable to discharge today until she follows up with neurosurgeon and determines precautions, or transitions to [facility] services.     Plan  P: Discharge pt. Pt is welcome to return with new referral once she sees neurosurgeon and is cleared for overhead activities       Patient will benefit from skilled therapeutic intervention in order to improve the following deficits and impairments:  Impaired vision/preception, Decreased knowledge of precautions, Impaired perceived functional ability, Decreased activity tolerance, Decreased knowledge of use of DME, Decreased strength, Decreased coordination, Decreased safety awareness, Impaired UE functional use  Visit Diagnosis: Muscle weakness (generalized)  Other symptoms and signs involving the musculoskeletal system    Problem List Patient Active Problem List   Diagnosis Date Noted  . RLQ abdominal pain 08/15/2016  . Diabetic complication ([place]) 70/26/3785  . Mucosal abnormality of stomach   . Diverticulosis of colon without hemorrhage   . Dysphagia, pharyngoesophageal phase   . Esophageal dysphagia 12/02/2014  . LLQ pain 02/22/2014  . Abdominal pain, epigastric 02/22/2014  . Encounter for therapeutic drug monitoring 05/18/2013  . Adenomatous polyps 11/21/2011  . Long term (current) use of anticoagulants 07/03/2010  . Permanent atrial fibrillation ([place]) 08/04/2009  . RENAL DISEASE, CHRONIC, STAGE III 08/04/2009  . IBS 04/14/2009  . Diabetes mellitus with stage 3 chronic kidney disease ([place]) 02/17/2009  . ANXIETY 02/17/2009  .  GERD 02/17/2009  . DIARRHEA, CHRONIC 02/17/2009  . Mixed hyperlipidemia 12/21/2008  . Essential hypertension, benign 12/21/2008   Guadelupe Sabin, OTR/L  [phone] 10/01/2017, 12:12 PM  [place] [address] [place], [place], [address] Phone: [phone]   Fax:  [phone]  Name: [name] MRN: [id number] Date of Birth: 09-11-1934    OCCUPATIONAL THERAPY DISCHARGE SUMMARY  Visits from Start of Care: 5  Current functional level related to goals / functional outcomes: See above. Pt reports she is working on obtaining [facility] services through her MD. Pt also has yet to see the neurosurgeon, she did see the PA. At this time pt is at a standstill due to unknown cervical precautions and ability to perform overhead activities. Pt is currently unable to progress and is agreeable to discharge until she sees MD and is aware of her specific precautions/contraindications. Pt is performing ADLs at supervision level per report and has marginally improved grip/pinch strength.    Remaining deficits: BUE weakness, activity tolerance   Education / Equipment: HEP for ADL completion and BUE strengthening (below shoulder level)  Plan: Patient agrees to discharge.  Patient goals were partially met. Patient is being discharged due to lack of progress.  ?????

## 2017-10-01 NOTE — Therapy (Signed)
[facility] [address] [place], [place], [address] Phone: [phone]   Fax:  [phone]  Physical Therapy Treatment/Progress Note  Patient Details  Name: [name] MRN: [id number] Date of Birth: 1934-12-23 Referring Provider: Dr. Agapito Games   Encounter Date: 10/01/2017   Progress Note Reporting Period 09/03/17 to 10/01/17  See note below for Objective Data and Assessment of Progress/Goals.     PT End of Session - 10/01/17 1224    Visit Number  7    Number of Visits  17    Date for PT Re-Evaluation  10/31/17 Mini-reassess 10/01/17    Authorization Type  Primary: Medicare; Secondary: AARP    Authorization Time Period  09/03/17 - 10/31/17    Authorization - Visit Number  1    Authorization - Number of Visits  10    PT Start Time  1119    PT Stop Time  1200    PT Time Calculation (min)  41 min    Equipment Utilized During Treatment  Gait belt    Activity Tolerance  Patient tolerated treatment well;Patient limited by fatigue    Behavior During Therapy  WFL for tasks assessed/performed       Past Medical History:  Diagnosis Date  . Anxiety   . Asthma   . Chronic back pain   . Colonoscopy causing post-procedural bleeding    Incomplete. by Dr. [name] 06/11/99 due to fixed non-compliant colon precluded exam to 40 cm, she had subsequent colectomy for diverticulitis since that time.   . Diarrhea   . Esophageal reflux   . Essential hypertension   . Fibromyalgia   . Hiatal hernia    Recent EGD/TCS showed small hiatal hernia, normal apperaring tubular esophagus. s/p passage of a 54-french Maloney dilator, s/p biopsy esophageal mucosa, multiple fundal gland type gastric polyps. one large pedunculated polp with oozing, noted s/p clipping and snare polypectomy. Biopsies of the gastric mucosa taken given her symptoms in her elevated count. normal D1-D3, s/p biospy D2-D3.   [name] Hiatal hernia    all bx unremarkable. on TCS she had  left-sided diverticula, evidence of prior segmental resection with anastomosis, multiple colonic polyps s/p multiple snare polypectomies, s/p segmental biopsy and stool sampling. she had multiple tubular adenomas. no microscopic/collagenous colitis. stool culture, c diff, O+P, lactoferrin were negative.   . Mixed hyperlipidemia   . Nephrolithiasis   . OSA (obstructive sleep apnea)   . Permanent atrial fibrillation (HCC)    Previous failed DCCV  . Type 2 diabetes mellitus ([place])   . Ureteral stent retained    Not retained. ureteral stent removal gross hematuria resolved 10/11. coumadin restarted.   . Ventral hernia     Past Surgical History:  Procedure Laterality Date  . BREAST LUMPECTOMY     benign cyst  . CATARACT EXTRACTION W/PHACO Left 08/17/2012   Procedure: CATARACT EXTRACTION PHACO AND INTRAOCULAR LENS PLACEMENT (IOC);  Surgeon: [name], MD;  Location: [facility];  Service: Ophthalmology;  Laterality: Left;  CDE:18.73  . COLECTOMY     2005. Dr. Geoffry Paradise for diverticulitis  . COLONOSCOPY  02/22/09   normal/left-sided diverticula/multiple colonic polyp, adenomatous  . COLONOSCOPY  12/16/2011   colonic polyps-treated as described above. Status postprior sigmoid resection. adenomatous. next TCS 12/2014  . COLONOSCOPY N/A 12/28/2014   Status post prior segmental resection. Few residual colonic diverticula- status post segmental biopsy negative random colon biopsies  . ESOPHAGEAL DILATION N/A 12/28/2014   Procedure: ESOPHAGEAL DILATION;  Surgeon:  [name], MD;  Location: [facility];  Service: Endoscopy;  Laterality: N/A;  . ESOPHAGOGASTRODUODENOSCOPY  02/22/09   normal s/p dilator;small HH/one large pedunculates polyp  s/p clipping, hyperplastic  . ESOPHAGOGASTRODUODENOSCOPY  12/16/2011   [name]: small hiatal hernia. Hyperplastic apperating polyps. Status post [name] dilation as described above.  . ESOPHAGOGASTRODUODENOSCOPY N/A 12/28/2014   RMR: Normal-appearing esophagus status  post passage of a maloney dilator. Hiatal hernia. abnormal gastic mucosa of uncertain significance status post gastric biopsy with mild chronic gastritis, no H pylori.   . TONSILLECTOMY    . TOTAL ABDOMINAL HYSTERECTOMY      There were no vitals filed for this visit.  Subjective Assessment - 10/01/17 1230    Subjective  Patient reports she will go back to neurosurgeon on July 15th to see the doctor as she saw his PA last visit. She reports she is having trouble with transfering at home because she is getting too much assistance from her sons at times. She states she is also developing a small sore at her chin where the neck brace rests.     Pertinent History  Fall down 21 steps, SDH and craniotomy to correct this on 07/26/17. C2 fractures. History of DMII and HTN.     Limitations  Walking;Standing;House hold activities    Patient Stated Goals  Get back to doing regular household activities    Currently in Pain?  No/denies         [facility] PT Assessment - 10/01/17 0001      Assessment   Medical Diagnosis  s/p SDH    Referring Provider  Dr. Agapito Games    Onset Date/Surgical Date  07/26/17    Hand Dominance  Left    Next MD Visit  10/20/17 with neurosurgery      Precautions   Precautions  Cervical;Fall    Precaution Comments  C-Collar to wear 24/7 (has another collar for bathing)    Required Braces or Orthoses  Cervical Brace    Cervical Brace  Hard collar;At all times [place] collar      Restrictions   Weight Bearing Restrictions  No      Balance Screen   Has the patient fallen in the past 6 months  Yes    Has the patient had a decrease in activity level because of a fear of falling?   Yes    Is the patient reluctant to leave their home because of a fear of falling?   Yes      Observation/Other Assessments   Focus on Therapeutic Outcomes (FOTO)   -- 77% limited on 09/03/17       [facility] Adult PT Treatment/Exercise - 10/01/17 0001      Transfers   Transfers  Sit to Stand;Stand  Pivot Transfers    Sit to Stand  5: Supervision    Sit to Stand Details  Verbal cues for sequencing    Stand Pivot Transfers  5: Supervision    Stand Pivot Transfer Details (indicate cue type and reason)  verbal cues for safe hand placement to push up from arm rest    Transfer Cueing  Patient/family training provided for safe guarding/supervision during sit to stand and stand pivot transfers. Patient performed transfers without physical assistance and demonstrated safe sequencing of stepping with RW. Patient's son demosntrated safe guarding technique and was educated to let the patient perform the sit to stand without physical assistance.      Ambulation/Gait   Ambulation/Gait  Yes  Ambulation Distance (Feet)  200 Feet 2MWT    Assistive device  Rolling walker    Gait Pattern  Step-through pattern;Decreased step length - right;Decreased step length - left;Decreased stride length;Decreased hip/knee flexion - right;Decreased hip/knee flexion - left;Trunk flexed    Ambulation Surface  Level;Indoor    Gait velocity  0.5 m/s    Gait Comments  Patient/family training performed to educate on safe guarding techniques for 2 bouts of gait with RW 80', 45'. Patient's son demonstrated good guarding and safe close proximety to patient without providing too much physical assistance that the patient does not require. Wheelchair follow was provided for safety.      Posture/Postural Control   Posture/Postural Control  Postural limitations    Postural Limitations  Rounded Shoulders      Knee/Hip Exercises: Seated   Abduction/Adduction   Both;Strengthening;2 sets;15 reps;Limitations    Abd/Adduction Limitations  red TB         PT Education - 10/01/17 1227    Education Details  Majority of sesion spent on patient/family training and education on safe transfer techniques and safe guarding during ambulation to improve mobility at home. Patient't son Laverna Peace was an active participant in training and  demonstrated competance with safe guarding during sit to stand and stand pivot transfers, and gait with RW for short distances. Educated on updated HEP to assist with seated hip abduction exercise and to begin waling program with supervision at home. Educted to place chair at the end of the hallway if it is long to provide seated rest breaks.    Person(s) Educated  Patient;Child(ren) son - Laverna Peace    Methods  Explanation;Handout;Demonstration    Comprehension  Verbalized understanding;Returned demonstration        PT Short Term Goals - 10/01/17 1226      PT SHORT TERM GOAL #1   Title  Patient will demonstrate understanding and report regular compliance with HEP in order to improve functional mobility and return to prior level of function.     Time  4    Period  Weeks    Status  Achieved      PT SHORT TERM GOAL #2   Title  Patient will demonstrate ability to perform stand pivot transfer with no more than supervision assistance.     Time  4    Period  Weeks    Status  Achieved      PT SHORT TERM GOAL #3   Title  Patient will demonstrate ability to ambulate 80 feet with least retrictive assistive device and no more than supervision assistance in order to perform household ambulation with improved safety.     Time  4    Period  Weeks    Status  Achieved        PT Long Term Goals - 10/01/17 1226      PT LONG TERM GOAL #1   Title  Patient will demonstrate ability to ambulate 180 feet with least retrictive assistive device and no more than supervision assistance in order to perform household ambulation with improved safety at a velocity of 0.8 m/s.     Baseline  10/01/17 - 0.5 m/s for 200' with RW    Time  8    Period  Weeks    Status  On-going      PT LONG TERM GOAL #2   Title  Patient will report ability to stand for 20 minutes in order to prepare a meal.     Time  8    Period  Weeks    Status  On-going      PT LONG TERM GOAL #3   Title  If cleared with spinal precautions,  patient will demonstrate ability to perform bed mobility including rolling and supine to sit with no more than supervision assistance.     Time  8    Period  Weeks    Status  On-going        Plan - 10/01/17 1225    Clinical Impression Statement  Re-assessment performed this session and patient has met all short term goals and is making good progress towards long term goals. She was abnle to ambulate 200' today with RW during 2MWT without a rest break. This places her a 0.5 m/s gait velocity indicating she falls within the limited community ambulator category. Patient expressed concerns about her two sons being over protective and trying to assist her with transfers and walking mroe than she needs which is making it more difficult to safely complete transfers. Therefore today's session focused on patient/family education and training in safe guarding during functional transfers and short distances for gait. Her son Jimmyw as an active participant and demonstrated competance with guarding his mom. She will continue to benefit from skilled PT interventions to further progress strength and independence with functional mobility.    Rehab Potential  Fair    Clinical Impairments Affecting Rehab Potential  Positive: family support, motivated. Negative: Complexity of medical conditions    PT Frequency  2x / week    PT Duration  8 weeks    PT Treatment/Interventions  ADLs/Self Care Home Management;DME Instruction;Gait training;Stair training;Functional mobility training;Therapeutic activities;Therapeutic exercise;Balance training;Neuromuscular re-education;Patient/family education;Orthotic Fit/Training;Wheelchair mobility training;Manual techniques;Passive range of motion;Energy conservation    PT Next Visit Plan  Continue to follow up on HHPT. Continue with seated strengthening, sit to stands, gait training with RW, transfer training, and sidestepping in front of mat table  Increase standing LE strengthening  activities as able. Patient see's neurosurgeon on July15th.    PT Home Exercise Plan  09/10/17 - Seated marching, toes raises, and heel raises; 09/17/17: long arc quads 2 x 10 repetitions each lower extremity 1x/day; 09/26/17: seated hip abduction with red theraband 2 x 10 3x/week; 10/01/17 - handout on sit to stand transfers    Consulted and Agree with Plan of Care  Patient    Family Member Consulted  Patient's son - Laverna Peace       Patient will benefit from skilled therapeutic intervention in order to improve the following deficits and impairments:  Abnormal gait, Decreased balance, Decreased endurance, Decreased mobility, Difficulty walking, Dizziness, Improper body mechanics, Obesity, Decreased activity tolerance, Decreased knowledge of use of DME, Decreased strength, Postural dysfunction, Pain  Visit Diagnosis: S/P evacuation of subdural hematoma  Muscle weakness (generalized)  Other symptoms and signs involving the musculoskeletal system     Problem List Patient Active Problem List   Diagnosis Date Noted  . RLQ abdominal pain 08/15/2016  . Diabetic complication ([place]) 78/93/8101  . Mucosal abnormality of stomach   . Diverticulosis of colon without hemorrhage   . Dysphagia, pharyngoesophageal phase   . Esophageal dysphagia 12/02/2014  . LLQ pain 02/22/2014  . Abdominal pain, epigastric 02/22/2014  . Encounter for therapeutic drug monitoring 05/18/2013  . Adenomatous polyps 11/21/2011  . Long term (current) use of anticoagulants 07/03/2010  . Permanent atrial fibrillation ([place]) 08/04/2009  . RENAL DISEASE, CHRONIC, STAGE III 08/04/2009  . IBS 04/14/2009  . Diabetes mellitus with  stage 3 chronic kidney disease ([place]) 02/17/2009  . ANXIETY 02/17/2009  . GERD 02/17/2009  . DIARRHEA, CHRONIC 02/17/2009  . Mixed hyperlipidemia 12/21/2008  . Essential hypertension, benign 12/21/2008    Kipp Brood, PT, DPT Physical Therapist with [facility]  10/01/2017  12:45 PM    [facility] [address] [place], [place], [address] Phone: [phone]   Fax:  [phone]  Name: [name] MRN: [id number] Date of Birth: 04/01/1935

## 2017-10-01 NOTE — Telephone Encounter (Signed)
Spoke with patient regarding disc. She stated it was the disc from her hospitalization in [place]. She said she requested the disc and they told her she would have to pay for it. They are going to do that and they will ship it to her. She also said Dr.Jones said he couldn't do anything for her until he was able to review the disc. I told her to let him see this disc first and ask that he fax [place] a copy of his office visit notes with verbal understanding. She stated that Dr.Hagler had said she would like to see the disc as well. I told her to let Dr.Jones review it first, then let Dr.Hagler review it. She verbalized understanding.

## 2017-10-03 ENCOUNTER — Ambulatory Visit (HOSPITAL_COMMUNITY): Payer: Medicare Other | Admitting: Physical Therapy

## 2017-10-03 ENCOUNTER — Encounter (HOSPITAL_COMMUNITY): Payer: Self-pay | Admitting: Physical Therapy

## 2017-10-03 DIAGNOSIS — Z9889 Other specified postprocedural states: Secondary | ICD-10-CM | POA: Diagnosis not present

## 2017-10-03 DIAGNOSIS — R41841 Cognitive communication deficit: Secondary | ICD-10-CM | POA: Diagnosis not present

## 2017-10-03 DIAGNOSIS — M6281 Muscle weakness (generalized): Secondary | ICD-10-CM

## 2017-10-03 DIAGNOSIS — Z8679 Personal history of other diseases of the circulatory system: Secondary | ICD-10-CM | POA: Diagnosis not present

## 2017-10-03 DIAGNOSIS — R29898 Other symptoms and signs involving the musculoskeletal system: Secondary | ICD-10-CM

## 2017-10-03 NOTE — Therapy (Signed)
[address] [place], [place], [address] Phone: [phone]   Fax:  [phone]  Physical Therapy Treatment  Patient Details  Name: [name] MRN: [id number] Date of Birth: Sep 27, 1934 Referring Provider: Dr. Agapito Games   Encounter Date: 10/03/2017  PT End of Session - 10/03/17 1207    Visit Number  8    Number of Visits  17    Date for PT Re-Evaluation  10/31/17 Mini-reassess 10/01/17    Authorization Type  Primary: Medicare; Secondary: AARP    Authorization Time Period  09/03/17 - 10/31/17    Authorization - Visit Number  2    Authorization - Number of Visits  10    PT Start Time  1120 Patient arrived late    PT Stop Time  1200    PT Time Calculation (min)  40 min    Equipment Utilized During Treatment  Gait belt    Activity Tolerance  Patient tolerated treatment well;Patient limited by fatigue    Behavior During Therapy  WFL for tasks assessed/performed       Past Medical History:  Diagnosis Date  . Anxiety   . Asthma   . Chronic back pain   . Colonoscopy causing post-procedural bleeding    Incomplete. by Dr. [name] 06/11/99 due to fixed non-compliant colon precluded exam to 40 cm, she had subsequent colectomy for diverticulitis since that time.   . Diarrhea   . Esophageal reflux   . Essential hypertension   . Fibromyalgia   . Hiatal hernia    Recent EGD/TCS showed small hiatal hernia, normal apperaring tubular esophagus. s/p passage of a 54-french Maloney dilator, s/p biopsy esophageal mucosa, multiple fundal gland type gastric polyps. one large pedunculated polp with oozing, noted s/p clipping and snare polypectomy. Biopsies of the gastric mucosa taken given her symptoms in her elevated count. normal D1-D3, s/p biospy D2-D3.   [name] Hiatal hernia    all bx unremarkable. on TCS she had left-sided diverticula, evidence of prior segmental resection with anastomosis, multiple colonic polyps s/p multiple snare  polypectomies, s/p segmental biopsy and stool sampling. she had multiple tubular adenomas. no microscopic/collagenous colitis. stool culture, c diff, O+P, lactoferrin were negative.   . Mixed hyperlipidemia   . Nephrolithiasis   . OSA (obstructive sleep apnea)   . Permanent atrial fibrillation (HCC)    Previous failed DCCV  . Type 2 diabetes mellitus ([place])   . Ureteral stent retained    Not retained. ureteral stent removal gross hematuria resolved 10/11. coumadin restarted.   . Ventral hernia     Past Surgical History:  Procedure Laterality Date  . BREAST LUMPECTOMY     benign cyst  . CATARACT EXTRACTION W/PHACO Left 08/17/2012   Procedure: CATARACT EXTRACTION PHACO AND INTRAOCULAR LENS PLACEMENT (IOC);  Surgeon: [name], MD;  Location: [facility];  Service: Ophthalmology;  Laterality: Left;  CDE:18.73  . COLECTOMY     2005. Dr. Geoffry Paradise for diverticulitis  . COLONOSCOPY  02/22/09   normal/left-sided diverticula/multiple colonic polyp, adenomatous  . COLONOSCOPY  12/16/2011   colonic polyps-treated as described above. Status postprior sigmoid resection. adenomatous. next TCS 12/2014  . COLONOSCOPY N/A 12/28/2014   Status post prior segmental resection. Few residual colonic diverticula- status post segmental biopsy negative random colon biopsies  . ESOPHAGEAL DILATION N/A 12/28/2014   Procedure: ESOPHAGEAL DILATION;  Surgeon: [name], MD;  Location: [facility];  Service: Endoscopy;  Laterality: N/A;  . ESOPHAGOGASTRODUODENOSCOPY  02/22/09  normal s/p dilator;small HH/one large pedunculates polyp  s/p clipping, hyperplastic  . ESOPHAGOGASTRODUODENOSCOPY  12/16/2011   [name]: small hiatal hernia. Hyperplastic apperating polyps. Status post [name] dilation as described above.  . ESOPHAGOGASTRODUODENOSCOPY N/A 12/28/2014   RMR: Normal-appearing esophagus status post passage of a maloney dilator. Hiatal hernia. abnormal gastic mucosa of uncertain significance status post gastric biopsy  with mild chronic gastritis, no H pylori.   . TONSILLECTOMY    . TOTAL ABDOMINAL HYSTERECTOMY      There were no vitals filed for this visit.  Subjective Assessment - 10/03/17 1134    Subjective  Patient reported that she has had some difficulty with bowel movements since her accident. Patient reported that she is feeling good today and that her blood glucose was 218 at the start of the session.     Pertinent History  Fall down 21 steps, SDH and craniotomy to correct this on 07/26/17. C2 fractures. History of DMII and HTN.     Limitations  Walking;Standing;House hold activities    Patient Stated Goals  Get back to doing regular household activities    Currently in Pain?  No/denies                       [facility] Adult PT Treatment/Exercise - 10/03/17 0001      Transfers   Transfers  Sit to Stand;Stand Pivot Transfers    Sit to Stand  5: Supervision    Sit to Stand Details  Verbal cues for sequencing    Stand Pivot Transfers  5: Supervision    Stand Pivot Transfer Details (indicate cue type and reason)  Verbal cues to push up from arm rest      Ambulation/Gait   Ambulation/Gait  Yes    Ambulation Distance (Feet)  180 Feet with metronome set at 76 BPM and verbal cues for form    Assistive device  Rolling walker    Gait Pattern  Step-through pattern;Decreased step length - right;Decreased step length - left;Decreased stride length;Decreased hip/knee flexion - right;Decreased hip/knee flexion - left;Trunk flexed    Ambulation Surface  Level;Indoor    Gait Comments  Metronome set for 76 BPM      Knee/Hip Exercises: Standing   Hip Extension  Stengthening;Right;Left;1 set;10 reps;Knee straight;Other (comment) Bilateral upper extremity support    Forward Step Up  Right;Left;1 set;10 reps;Hand Hold: 2;Step Height: 4"    Other Standing Knee Exercises  Sidestepping inside parallel bars 8 feet x 2 round trips bilateral upper extremity support VCs to keep toes pointing forward       Knee/Hip Exercises: Seated   Long Arc Quad  Strengthening;Right;Left;2 sets;10 reps    Long Arc Quad Weight  2 lbs.    Sit to Sand  10 reps;without UE support From standard height chair          Balance Exercises - 10/03/17 1205      Balance Exercises: Standing   Marching Limitations  x 20 alternating legs 3'' holds intermittent upper extremity assistance        PT Education - 10/03/17 1206    Education Details  Discussed with patient her bowel issues and instructed patient on the importance of following up with her physician regarding her concerns and to let us know what the physician says.     Person(s) Educated  Patient    Methods  Explanation    Comprehension  Verbalized understanding       PT Short Term Goals - 10/01/17  1226      PT SHORT TERM GOAL #1   Title  Patient will demonstrate understanding and report regular compliance with HEP in order to improve functional mobility and return to prior level of function.     Time  4    Period  Weeks    Status  Achieved      PT SHORT TERM GOAL #2   Title  Patient will demonstrate ability to perform stand pivot transfer with no more than supervision assistance.     Time  4    Period  Weeks    Status  Achieved      PT SHORT TERM GOAL #3   Title  Patient will demonstrate ability to ambulate 80 feet with least retrictive assistive device and no more than supervision assistance in order to perform household ambulation with improved safety.     Time  4    Period  Weeks    Status  Achieved        PT Long Term Goals - 10/01/17 1226      PT LONG TERM GOAL #1   Title  Patient will demonstrate ability to ambulate 180 feet with least retrictive assistive device and no more than supervision assistance in order to perform household ambulation with improved safety at a velocity of 0.8 m/s.     Baseline  10/01/17 - 0.5 m/s for 200' with RW    Time  8    Period  Weeks    Status  On-going      PT LONG TERM GOAL #2   Title   Patient will report ability to stand for 20 minutes in order to prepare a meal.     Time  8    Period  Weeks    Status  On-going      PT LONG TERM GOAL #3   Title  If cleared with spinal precautions, patient will demonstrate ability to perform bed mobility including rolling and supine to sit with no more than supervision assistance.     Time  8    Period  Weeks    Status  On-going            Plan - 10/03/17 1210    Clinical Impression Statement  This session continued to progress patient with gait training, balance activities, and exercises for lower extremity strengthening. At the beginning of the session, patient reported that her blood glucose was 218, and that she felt good. Patient's SPO2 was monitored throughout the session as patient reported some shortness of breath and it remained between 97-98% throughout. Therapist initially discussed with patient about concerns of bowel and bladder and following up with her physician regarding this and letting us know what her physician says. This session progressed patient with gait training by having patient perform ambulation for 180 feet with metronome set at 76 BPM as well as had patient perform sidestepping inside the parallel bars with cueing. This session also added forward step ups onto 4 inch step and standing marching to begin progressing patient with standing balance activities. Patient would benefit from continued skilled physical therapy in order to continue addressing deficits and help patient return to prior level of function.     Rehab Potential  Fair    Clinical Impairments Affecting Rehab Potential  Positive: family support, motivated. Negative: Complexity of medical conditions    PT Frequency  2x / week    PT Duration  8 weeks    PT Treatment/Interventions  ADLs/Self Care Home Management;DME Instruction;Gait training;Stair training;Functional mobility training;Therapeutic activities;Therapeutic exercise;Balance  training;Neuromuscular re-education;Patient/family education;Orthotic Fit/Training;Wheelchair mobility training;Manual techniques;Passive range of motion;Energy conservation    PT Next Visit Plan  Continue to follow up on HHPT. Progress with standing strengthening as able, sit to stands, gait training with RW, transfer training, and sidestepping. Patient see's neurosurgeon on July15th. Follow-up about patient contacting physician    PT Home Exercise Plan  09/10/17 - Seated marching, toes raises, and heel raises; 09/17/17: long arc quads 2 x 10 repetitions each lower extremity 1x/day; 09/26/17: seated hip abduction with red theraband 2 x 10 3x/week; 10/01/17 - handout on sit to stand transfers    Consulted and Agree with Plan of Care  Patient    Family Member Consulted  --       Patient will benefit from skilled therapeutic intervention in order to improve the following deficits and impairments:  Abnormal gait, Decreased balance, Decreased endurance, Decreased mobility, Difficulty walking, Dizziness, Improper body mechanics, Obesity, Decreased activity tolerance, Decreased knowledge of use of DME, Decreased strength, Postural dysfunction, Pain  Visit Diagnosis: S/P evacuation of subdural hematoma  Muscle weakness (generalized)  Other symptoms and signs involving the musculoskeletal system     Problem List Patient Active Problem List   Diagnosis Date Noted  . RLQ abdominal pain 08/15/2016  . Diabetic complication ([place]) 95/18/8416  . Mucosal abnormality of stomach   . Diverticulosis of colon without hemorrhage   . Dysphagia, pharyngoesophageal phase   . Esophageal dysphagia 12/02/2014  . LLQ pain 02/22/2014  . Abdominal pain, epigastric 02/22/2014  . Encounter for therapeutic drug monitoring 05/18/2013  . Adenomatous polyps 11/21/2011  . Long term (current) use of anticoagulants 07/03/2010  . Permanent atrial fibrillation ([place]) 08/04/2009  . RENAL DISEASE, CHRONIC, STAGE III 08/04/2009  .  IBS 04/14/2009  . Diabetes mellitus with stage 3 chronic kidney disease ([place]) 02/17/2009  . ANXIETY 02/17/2009  . GERD 02/17/2009  . DIARRHEA, CHRONIC 02/17/2009  . Mixed hyperlipidemia 12/21/2008  . Essential hypertension, benign 12/21/2008   Clarene Critchley PT, DPT 12:15 PM, 10/03/17 [place], [place], [address] Phone: [phone]   Fax:  [phone]  Name: LADAJA YUSUPOV MRN: [id number] Date of Birth: 12/17/1934

## 2017-10-07 DIAGNOSIS — D509 Iron deficiency anemia, unspecified: Secondary | ICD-10-CM | POA: Diagnosis not present

## 2017-10-07 DIAGNOSIS — N1330 Unspecified hydronephrosis: Secondary | ICD-10-CM | POA: Diagnosis not present

## 2017-10-07 DIAGNOSIS — I10 Essential (primary) hypertension: Secondary | ICD-10-CM | POA: Diagnosis not present

## 2017-10-07 DIAGNOSIS — E1129 Type 2 diabetes mellitus with other diabetic kidney complication: Secondary | ICD-10-CM | POA: Diagnosis not present

## 2017-10-07 DIAGNOSIS — R809 Proteinuria, unspecified: Secondary | ICD-10-CM | POA: Diagnosis not present

## 2017-10-07 DIAGNOSIS — N183 Chronic kidney disease, stage 3 (moderate): Secondary | ICD-10-CM | POA: Diagnosis not present

## 2017-10-07 DIAGNOSIS — N200 Calculus of kidney: Secondary | ICD-10-CM | POA: Diagnosis not present

## 2017-10-07 DIAGNOSIS — D649 Anemia, unspecified: Secondary | ICD-10-CM | POA: Diagnosis not present

## 2017-10-08 ENCOUNTER — Encounter (HOSPITAL_COMMUNITY): Payer: Self-pay | Admitting: Physical Therapy

## 2017-10-08 ENCOUNTER — Ambulatory Visit (HOSPITAL_COMMUNITY): Payer: Medicare Other | Attending: Family Medicine | Admitting: Physical Therapy

## 2017-10-08 DIAGNOSIS — R29898 Other symptoms and signs involving the musculoskeletal system: Secondary | ICD-10-CM | POA: Insufficient documentation

## 2017-10-08 DIAGNOSIS — Z8679 Personal history of other diseases of the circulatory system: Secondary | ICD-10-CM

## 2017-10-08 DIAGNOSIS — R41841 Cognitive communication deficit: Secondary | ICD-10-CM | POA: Insufficient documentation

## 2017-10-08 DIAGNOSIS — M6281 Muscle weakness (generalized): Secondary | ICD-10-CM | POA: Diagnosis not present

## 2017-10-08 DIAGNOSIS — Z9889 Other specified postprocedural states: Secondary | ICD-10-CM | POA: Insufficient documentation

## 2017-10-08 NOTE — Therapy (Addendum)
[place], [place], [address] Phone: [phone]   Fax:  [phone]  Physical Therapy Treatment  Patient Details  Name: [name] MRN: [id number] Date of Birth: [birth year] Referring Provider: Dr. Agapito Games   Encounter Date: 10/08/2017  PT End of Session - 10/08/17 1211    Visit Number  9    Number of Visits  17    Date for PT Re-Evaluation  10/31/17 Mini-reassess 10/01/17    Authorization Type  Primary: Medicare; Secondary: AARP    Authorization Time Period  09/03/17 - 10/31/17    Authorization - Visit Number  3    Authorization - Number of Visits  10    PT Start Time  1122    PT Stop Time  1204    PT Time Calculation (min)  42 min    Equipment Utilized During Treatment  Gait belt    Activity Tolerance  Patient tolerated treatment well;Patient limited by fatigue    Behavior During Therapy  WFL for tasks assessed/performed       Past Medical History:  Diagnosis Date  . Anxiety   . Asthma   . Chronic back pain   . Colonoscopy causing post-procedural bleeding    Incomplete. by Dr. [name] 06/11/99 due to fixed non-compliant colon precluded exam to 40 cm, she had subsequent colectomy for diverticulitis since that time.   . Diarrhea   . Esophageal reflux   . Essential hypertension   . Fibromyalgia   . Hiatal hernia    Recent EGD/TCS showed small hiatal hernia, normal apperaring tubular esophagus. s/p passage of a 54-french Maloney dilator, s/p biopsy esophageal mucosa, multiple fundal gland type gastric polyps. one large pedunculated polp with oozing, noted s/p clipping and snare polypectomy. Biopsies of the gastric mucosa taken given her symptoms in her elevated count. normal D1-D3, s/p biospy D2-D3.   [name] Hiatal hernia    all bx unremarkable. on TCS she had left-sided diverticula, evidence of prior segmental resection with anastomosis, multiple colonic polyps s/p multiple snare polypectomies, s/p segmental  biopsy and stool sampling. she had multiple tubular adenomas. no microscopic/collagenous colitis. stool culture, c diff, O+P, lactoferrin were negative.   . Mixed hyperlipidemia   . Nephrolithiasis   . OSA (obstructive sleep apnea)   . Permanent atrial fibrillation (HCC)    Previous failed DCCV  . Type 2 diabetes mellitus ([place])   . Ureteral stent retained    Not retained. ureteral stent removal gross hematuria resolved 10/11. coumadin restarted.   . Ventral hernia     Past Surgical History:  Procedure Laterality Date  . BREAST LUMPECTOMY     benign cyst  . CATARACT EXTRACTION W/PHACO Left 08/17/2012   Procedure: CATARACT EXTRACTION PHACO AND INTRAOCULAR LENS PLACEMENT (IOC);  Surgeon: [name], MD;  Location: [facility];  Service: Ophthalmology;  Laterality: Left;  CDE:18.73  . COLECTOMY     2005. Dr. Geoffry Paradise for diverticulitis  . COLONOSCOPY  02/22/09   normal/left-sided diverticula/multiple colonic polyp, adenomatous  . COLONOSCOPY  12/16/2011   colonic polyps-treated as described above. Status postprior sigmoid resection. adenomatous. next TCS 12/2014  . COLONOSCOPY N/A 12/28/2014   Status post prior segmental resection. Few residual colonic diverticula- status post segmental biopsy negative random colon biopsies  . ESOPHAGEAL DILATION N/A 12/28/2014   Procedure: ESOPHAGEAL DILATION;  Surgeon: [name], MD;  [facility];  Service: Endoscopy;  Laterality: N/A;  . ESOPHAGOGASTRODUODENOSCOPY  02/22/09   normal s/p  dilator;small HH/one large pedunculates polyp  s/p clipping, hyperplastic  . ESOPHAGOGASTRODUODENOSCOPY  12/16/2011   [name]: small hiatal hernia. Hyperplastic apperating polyps. Status post [name] dilation as described above.  . ESOPHAGOGASTRODUODENOSCOPY N/A 12/28/2014   RMR: Normal-appearing esophagus status post passage of a maloney dilator. Hiatal hernia. abnormal gastic mucosa of uncertain significance status post gastric biopsy with mild chronic gastritis,  no H pylori.   . TONSILLECTOMY    . TOTAL ABDOMINAL HYSTERECTOMY      There were no vitals filed for this visit.  Subjective Assessment - 10/08/17 1130    Subjective  Patient reported that she went to the doctor yesterday and needs to start taking fluid pills, but has not picked them up yet. Patient reported that she is having a headache this session.     Pertinent History  Fall down 21 steps, SDH and craniotomy to correct this on 07/26/17. C2 fractures. History of DMII and HTN.     Limitations  Walking;Standing;House hold activities    Patient Stated Goals  Get back to doing regular household activities    Currently in Pain?  Yes    Pain Score  5     Pain Location  Head    Pain Orientation  Other (Comment) Frontal    Pain Descriptors / Indicators  Headache    Pain Type  Chronic pain    Pain Onset  More than a month ago                       [facility] Adult PT Treatment/Exercise - 10/08/17 0001      Ambulation/Gait   Ambulation/Gait  Yes    Ambulation Distance (Feet)  184 Feet    Assistive device  Rolling walker    Gait Pattern  Step-through pattern;Decreased step length - right;Decreased step length - left;Decreased stride length;Decreased hip/knee flexion - right;Decreased hip/knee flexion - left;Trunk flexed    Ambulation Surface  Level;Indoor      Knee/Hip Exercises: Standing   Hip Extension  Stengthening;Right;Left;1 set;10 reps;Knee straight;Other (comment) Bilateral upper extremity support    Other Standing Knee Exercises  Sidestepping inside parallel bars 8 feet x 3 round trips bilateral upper extremity support Verbal cues to keep toes pointing forward      Knee/Hip Exercises: Seated   Long Arc Quad  Strengthening;Right;Left;2 sets;10 reps    Long Arc Quad Weight  2 lbs.    Sit to Sand  10 reps;without UE support From standard height chair          Balance Exercises - 10/08/17 1207      Balance Exercises: Standing   Tandem Stance  Eyes  open;Foam/compliant surface;2 reps;30 secs;Intermittent upper extremity support    Marching Limitations  x 20 alternating legs 3'' holds intermittent upper extremity assistance        PT Education - 10/08/17 1210    Education Details  Discussed with patient her progress throughout session and purpose and technique of exercises throughout.     Person(s) Educated  Patient    Comprehension  Verbalized understanding;Returned demonstration;Verbal cues required;Tactile cues required       PT Short Term Goals - 10/01/17 1226      PT SHORT TERM GOAL #1   Title  Patient will demonstrate understanding and report regular compliance with HEP in order to improve functional mobility and return to prior level of function.     Time  4    Period  Weeks    Status  Achieved      PT SHORT TERM GOAL #2   Title  Patient will demonstrate ability to perform stand pivot transfer with no more than supervision assistance.     Time  4    Period  Weeks    Status  Achieved      PT SHORT TERM GOAL #3   Title  Patient will demonstrate ability to ambulate 80 feet with least retrictive assistive device and no more than supervision assistance in order to perform household ambulation with improved safety.     Time  4    Period  Weeks    Status  Achieved        PT Long Term Goals - 10/01/17 1226      PT LONG TERM GOAL #1   Title  Patient will demonstrate ability to ambulate 180 feet with least retrictive assistive device and no more than supervision assistance in order to perform household ambulation with improved safety at a velocity of 0.8 m/s.     Baseline  10/01/17 - 0.5 m/s for 200' with RW    Time  8    Period  Weeks    Status  On-going      PT LONG TERM GOAL #2   Title  Patient will report ability to stand for 20 minutes in order to prepare a meal.     Time  8    Period  Weeks    Status  On-going      PT LONG TERM GOAL #3   Title  If cleared with spinal precautions, patient will demonstrate  ability to perform bed mobility including rolling and supine to sit with no more than supervision assistance.     Time  8    Period  Weeks    Status  On-going            Plan - 10/08/17 1212    Clinical Impression Statement  This session continued to progress patient with gait training, lower extremity strengthening and balance exercises. This session added tandem stance on foam to challenge patient's proprioception and balance. Patient required intermittent single and double upper extremity assistance with this. This session, the patient also ambulated 184 feet. Patient reported that she discussed her bowel and bladder issues with her physician and their major concern was for her iron level. Patient would benefit from continued skilled physical therapy in order to continue progressing toward functional goals.     Rehab Potential  Fair    Clinical Impairments Affecting Rehab Potential  Positive: family support, motivated. Negative: Complexity of medical conditions    PT Frequency  2x / week    PT Duration  8 weeks    PT Treatment/Interventions  ADLs/Self Care Home Management;DME Instruction;Gait training;Stair training;Functional mobility training;Therapeutic activities;Therapeutic exercise;Balance training;Neuromuscular re-education;Patient/family education;Orthotic Fit/Training;Wheelchair mobility training;Manual techniques;Passive range of motion;Energy conservation    PT Next Visit Plan  Continue to follow up on HHPT. Progress with standing strengthening as able, sit to stands, gait training with RW, transfer training, and sidestepping. Patient see's neurosurgeon on July15th. Follow-up about patient contacting physician    PT Home Exercise Plan  09/10/17 - Seated marching, toes raises, and heel raises; 09/17/17: long arc quads 2 x 10 repetitions each lower extremity 1x/day; 09/26/17: seated hip abduction with red theraband 2 x 10 3x/week; 10/01/17 - handout on sit to stand transfers    Consulted  and Agree with Plan of Care  Patient       Patient will benefit  from skilled therapeutic intervention in order to improve the following deficits and impairments:  Abnormal gait, Decreased balance, Decreased endurance, Decreased mobility, Difficulty walking, Dizziness, Improper body mechanics, Obesity, Decreased activity tolerance, Decreased knowledge of use of DME, Decreased strength, Postural dysfunction, Pain  Visit Diagnosis: S/P evacuation of subdural hematoma  Muscle weakness (generalized)  Other symptoms and signs involving the musculoskeletal system       Problem List Patient Active Problem List   Diagnosis Date Noted  . RLQ abdominal pain 08/15/2016  . Diabetic complication ([place]) 17/49/4496  . Mucosal abnormality of stomach   . Diverticulosis of colon without hemorrhage   . Dysphagia, pharyngoesophageal phase   . Esophageal dysphagia 12/02/2014  . LLQ pain 02/22/2014  . Abdominal pain, epigastric 02/22/2014  . Encounter for therapeutic drug monitoring 05/18/2013  . Adenomatous polyps 11/21/2011  . Long term (current) use of anticoagulants 07/03/2010  . Permanent atrial fibrillation ([employer]) 08/04/2009  . RENAL DISEASE, CHRONIC, STAGE III 08/04/2009  . IBS 04/14/2009  . Diabetes mellitus with stage 3 chronic kidney disease ([place]) 02/17/2009  . ANXIETY 02/17/2009  . GERD 02/17/2009  . DIARRHEA, CHRONIC 02/17/2009  . Mixed hyperlipidemia 12/21/2008  . Essential hypertension, benign 12/21/2008   Clarene Critchley PT, DPT 12:14 PM, 10/08/17 [place], [place], [address] Phone: [phone]   Fax:  [phone]  Name: [name] MRN: [id number] Date of Birth: 06/18/1934

## 2017-10-10 ENCOUNTER — Ambulatory Visit (HOSPITAL_COMMUNITY): Payer: Medicare Other | Admitting: Physical Therapy

## 2017-10-10 ENCOUNTER — Telehealth (HOSPITAL_COMMUNITY): Payer: Self-pay | Admitting: Physical Therapy

## 2017-10-10 NOTE — Telephone Encounter (Signed)
She is sick today and can not come in °

## 2017-10-15 ENCOUNTER — Ambulatory Visit (HOSPITAL_COMMUNITY): Payer: Medicare Other | Admitting: Physical Therapy

## 2017-10-15 DIAGNOSIS — M6281 Muscle weakness (generalized): Secondary | ICD-10-CM

## 2017-10-15 DIAGNOSIS — Z8679 Personal history of other diseases of the circulatory system: Secondary | ICD-10-CM

## 2017-10-15 DIAGNOSIS — Z9889 Other specified postprocedural states: Principal | ICD-10-CM

## 2017-10-15 DIAGNOSIS — R29898 Other symptoms and signs involving the musculoskeletal system: Secondary | ICD-10-CM

## 2017-10-15 NOTE — Therapy (Signed)
[place], [place], [address] Phone: [phone]   Fax:  [phone]  Patient Details  Name: [name] MRN: [id number] Date of Birth: May 10, 1934 Referring Provider:  Caren Macadam, MD  Encounter Date: 10/15/2017  Patient arrived for therapy session. However, patient reported that she was still not feeling better after having been sick last week. She also stated that she has been having dizzy spells since starting a new medication and also reported that her ankles are still swollen. Therapist took patient's blood pressure and found that it was 135/74 which is within a normal range for this patient. Therapist told patient that therapy would be held for today as patient is still not feeling better and due to the side effects she is feeling from the medication which she had not followed up with her physician about. Therapist told patient to call her physician today and to let clinic know any updates.   Clarene Critchley PT, DPT 11:40 AM, 10/15/17 [place], [place], [address] Phone: [phone]   Fax:  [phone]

## 2017-10-16 ENCOUNTER — Telehealth (HOSPITAL_COMMUNITY): Payer: Self-pay | Admitting: Family Medicine

## 2017-10-16 ENCOUNTER — Other Ambulatory Visit: Payer: Self-pay | Admitting: "Endocrinology

## 2017-10-16 ENCOUNTER — Telehealth: Payer: Self-pay | Admitting: Family Medicine

## 2017-10-16 ENCOUNTER — Other Ambulatory Visit: Payer: Self-pay | Admitting: Cardiology

## 2017-10-16 NOTE — Telephone Encounter (Signed)
Patient called in to let you know she has skipped 2 PT appointments because she has felt sick on her stomach. She also wants you to know that she has had some pain in her neck & tingling in her hands for about a week now Cb#> [phone]

## 2017-10-16 NOTE — Telephone Encounter (Signed)
10/16/17  pt left a message that she still is not over what she had the other day when she was here and had to leave

## 2017-10-16 NOTE — Telephone Encounter (Signed)
Please advise patient that she needs to make sure that she keeps her scheduled follow-ups with neurosurgery.  Her symptoms could be related to her neck issues and they would need to know about this information.  Has she seen the neurosurgeon since being back in [place]?

## 2017-10-16 NOTE — Telephone Encounter (Signed)
Called patient and discussed Dr.Hagler's recommendations with verbal understanding. She has a follow up appt with neurosurgery next week. [place]

## 2017-10-17 ENCOUNTER — Ambulatory Visit (HOSPITAL_COMMUNITY): Payer: Medicare Other | Admitting: Physical Therapy

## 2017-10-20 DIAGNOSIS — S12191A Other nondisplaced fracture of second cervical vertebra, initial encounter for closed fracture: Secondary | ICD-10-CM | POA: Diagnosis not present

## 2017-10-21 ENCOUNTER — Other Ambulatory Visit: Payer: Self-pay | Admitting: Student

## 2017-10-21 ENCOUNTER — Telehealth (HOSPITAL_COMMUNITY): Payer: Self-pay | Admitting: Physical Therapy

## 2017-10-21 ENCOUNTER — Ambulatory Visit (HOSPITAL_COMMUNITY): Payer: Medicare Other

## 2017-10-21 DIAGNOSIS — S12191A Other nondisplaced fracture of second cervical vertebra, initial encounter for closed fracture: Secondary | ICD-10-CM

## 2017-10-21 NOTE — Telephone Encounter (Signed)
Patient cancel because she don't have transportation

## 2017-10-22 ENCOUNTER — Ambulatory Visit (HOSPITAL_COMMUNITY): Payer: Self-pay

## 2017-10-24 ENCOUNTER — Ambulatory Visit (HOSPITAL_COMMUNITY): Payer: Medicare Other | Admitting: Physical Therapy

## 2017-10-24 ENCOUNTER — Encounter (HOSPITAL_COMMUNITY): Payer: Self-pay | Admitting: Physical Therapy

## 2017-10-24 DIAGNOSIS — Z8679 Personal history of other diseases of the circulatory system: Secondary | ICD-10-CM | POA: Diagnosis not present

## 2017-10-24 DIAGNOSIS — M6281 Muscle weakness (generalized): Secondary | ICD-10-CM

## 2017-10-24 DIAGNOSIS — R29898 Other symptoms and signs involving the musculoskeletal system: Secondary | ICD-10-CM | POA: Diagnosis not present

## 2017-10-24 DIAGNOSIS — Z9889 Other specified postprocedural states: Principal | ICD-10-CM

## 2017-10-24 DIAGNOSIS — R41841 Cognitive communication deficit: Secondary | ICD-10-CM | POA: Diagnosis not present

## 2017-10-24 NOTE — Therapy (Signed)
[place], [place], [address] Phone: [phone]   Fax:  [phone]  Physical Therapy Treatment  Patient Details  Name: [name] MRN: [id number] Date of Birth: [birth year] Referring Provider: Dr. Agapito Games   Encounter Date: 10/24/2017  PT End of Session - 10/24/17 1125    Visit Number  10    Number of Visits  17    Date for PT Re-Evaluation  10/31/17 Mini-reassess 10/01/17    Authorization Type  Primary: Medicare; Secondary: [employer]    Authorization Time Period  09/03/17 - 10/31/17    Authorization - Visit Number  4    Authorization - Number of Visits  10    PT Start Time  5361    PT Stop Time  1150    PT Time Calculation (min)  45 min    Equipment Utilized During Treatment  Gait belt    Activity Tolerance  Patient tolerated treatment well;Patient limited by fatigue    Behavior During Therapy  [facility] for tasks assessed/performed       Past Medical History:  Diagnosis Date  . Anxiety   . Asthma   . Chronic back pain   . Colonoscopy causing post-procedural bleeding    Incomplete. by Dr. [name] 06/11/99 due to fixed non-compliant colon precluded exam to 40 cm, she had subsequent colectomy for diverticulitis since that time.   . Diarrhea   . Esophageal reflux   . Essential hypertension   . Fibromyalgia   . Hiatal hernia    Recent EGD/TCS showed small hiatal hernia, normal apperaring tubular esophagus. s/p passage of a 54-french Maloney dilator, s/p biopsy esophageal mucosa, multiple fundal gland type gastric polyps. one large pedunculated polp with oozing, noted s/p clipping and snare polypectomy. Biopsies of the gastric mucosa taken given her symptoms in her elevated count. normal D1-D3, s/p biospy D2-D3.   [name] Hiatal hernia    all bx unremarkable. on TCS she had left-sided diverticula, evidence of prior segmental resection with anastomosis, multiple colonic polyps s/p multiple snare polypectomies, s/p segmental  biopsy and stool sampling. she had multiple tubular adenomas. no microscopic/collagenous colitis. stool culture, c diff, O+P, lactoferrin were negative.   . Mixed hyperlipidemia   . Nephrolithiasis   . OSA (obstructive sleep apnea)   . Permanent atrial fibrillation (HCC)    Previous failed DCCV  . Type 2 diabetes mellitus ([place])   . Ureteral stent retained    Not retained. ureteral stent removal gross hematuria resolved 10/11. coumadin restarted.   . Ventral hernia     Past Surgical History:  Procedure Laterality Date  . BREAST LUMPECTOMY     benign cyst  . CATARACT EXTRACTION W/PHACO Left 08/17/2012   Procedure: CATARACT EXTRACTION PHACO AND INTRAOCULAR LENS PLACEMENT (IOC);  Surgeon: [name], MD;  Location: [facility];  Service: Ophthalmology;  Laterality: Left;  CDE:18.73  . COLECTOMY     2005. Dr. Geoffry Paradise for diverticulitis  . COLONOSCOPY  02/22/09   normal/left-sided diverticula/multiple colonic polyp, adenomatous  . COLONOSCOPY  12/16/2011   colonic polyps-treated as described above. Status postprior sigmoid resection. adenomatous. next TCS 12/2014  . COLONOSCOPY N/A 12/28/2014   Status post prior segmental resection. Few residual colonic diverticula- status post segmental biopsy negative random colon biopsies  . ESOPHAGEAL DILATION N/A 12/28/2014   Procedure: ESOPHAGEAL DILATION;  Surgeon: [name], MD;  Location: [facility];  Service: Endoscopy;  Laterality: N/A;  . ESOPHAGOGASTRODUODENOSCOPY  02/22/09   normal s/p  dilator;small HH/one large pedunculates polyp  s/p clipping, hyperplastic  . ESOPHAGOGASTRODUODENOSCOPY  12/16/2011   [name]: small hiatal hernia. Hyperplastic apperating polyps. Status post [name] dilation as described above.  . ESOPHAGOGASTRODUODENOSCOPY N/A 12/28/2014   RMR: Normal-appearing esophagus status post passage of a maloney dilator. Hiatal hernia. abnormal gastic mucosa of uncertain significance status post gastric biopsy with mild chronic gastritis,  no H pylori.   . TONSILLECTOMY    . TOTAL ABDOMINAL HYSTERECTOMY      There were no vitals filed for this visit.  Subjective Assessment - 10/24/17 1123    Subjective  Patient reported that she is feeling better this session than the last time she was here. She said that she went to the neurologist last week and that they are going to perform a CT scan in 2 weeks to see if they can transition to a soft collar.     Pertinent History  Fall down 21 steps, SDH and craniotomy to correct this on 07/26/17. C2 fractures. History of DMII and HTN.     Limitations  Walking;Standing;House hold activities    Patient Stated Goals  Get back to doing regular household activities    Currently in Pain?  No/denies                       [facility] Adult PT Treatment/Exercise - 10/24/17 0001      Ambulation/Gait   Ambulation/Gait  Yes    Ambulation Distance (Feet)  210 Feet Metronome set for 78 bpm    Assistive device  Rolling walker    Gait Pattern  Step-through pattern;Decreased step length - right;Decreased step length - left;Decreased stride length;Decreased hip/knee flexion - right;Decreased hip/knee flexion - left;Trunk flexed    Ambulation Surface  Level;Indoor      Knee/Hip Exercises: Seated   Long Arc Quad  Strengthening;Right;Left;2 sets;10 reps    Long Arc Quad Weight  2 lbs.          Balance Exercises - 10/24/17 1131      Balance Exercises: Standing   Standing Eyes Closed  Narrow base of support (BOS);3 reps;30 secs    Tandem Stance  Eyes open;Foam/compliant surface;2 reps;30 secs;Intermittent upper extremity support    Marching Limitations  x 20 alternating legs 3'' holds intermittent upper extremity assistance    Other Standing Exercises  Palloff press with red theraband patient inside parallel bars with semitandem stance x 20 each lower extremity forward        PT Education - 10/24/17 1124    Education Details  Educated patient and her son on fall risk prevention and  provided a handout.     Person(s) Educated  Patient;Child(ren)    Methods  Explanation;Handout    Comprehension  Verbalized understanding       PT Short Term Goals - 10/01/17 1226      PT SHORT TERM GOAL #1   Title  Patient will demonstrate understanding and report regular compliance with HEP in order to improve functional mobility and return to prior level of function.     Time  4    Period  Weeks    Status  Achieved      PT SHORT TERM GOAL #2   Title  Patient will demonstrate ability to perform stand pivot transfer with no more than supervision assistance.     Time  4    Period  Weeks    Status  Achieved      PT SHORT TERM GOAL #  3   Title  Patient will demonstrate ability to ambulate 80 feet with least retrictive assistive device and no more than supervision assistance in order to perform household ambulation with improved safety.     Time  4    Period  Weeks    Status  Achieved        PT Long Term Goals - 10/01/17 1226      PT LONG TERM GOAL #1   Title  Patient will demonstrate ability to ambulate 180 feet with least retrictive assistive device and no more than supervision assistance in order to perform household ambulation with improved safety at a velocity of 0.8 m/s.     Baseline  10/01/17 - 0.5 m/s for 200' with RW    Time  8    Period  Weeks    Status  On-going      PT LONG TERM GOAL #2   Title  Patient will report ability to stand for 20 minutes in order to prepare a meal.     Time  8    Period  Weeks    Status  On-going      PT LONG TERM GOAL #3   Title  If cleared with spinal precautions, patient will demonstrate ability to perform bed mobility including rolling and supine to sit with no more than supervision assistance.     Time  8    Period  Weeks    Status  On-going            Plan - 10/24/17 1200    Clinical Impression Statement  This session patient reported feeling much better than previous time coming to therapy. Began session by providing  patient and son with education about fall preventions as well as information on patient's progress and plans to re-assess at next visit. Then session progressed to patient performing ambulation with rolling walker and patient demonstrated ability to ambulate 210 feet with metronome set to 78 BPM at this session before requiring a rest break. Remainder of session focused on strengthening and balance exercises. This session added NBOS balance with eyes closed and palloff press which patient tolerated well. Patient required several therapeutic rest breaks throughout session, but otherwise tolerated session well without complaints. Plan to re-assess patient next session.     Rehab Potential  Fair    Clinical Impairments Affecting Rehab Potential  Positive: family support, motivated. Negative: Complexity of medical conditions    PT Frequency  2x / week    PT Duration  8 weeks    PT Treatment/Interventions  ADLs/Self Care Home Management;DME Instruction;Gait training;Stair training;Functional mobility training;Therapeutic activities;Therapeutic exercise;Balance training;Neuromuscular re-education;Patient/family education;Orthotic Fit/Training;Wheelchair mobility training;Manual techniques;Passive range of motion;Energy conservation    PT Next Visit Plan  Re-assess next session, follow up about fall prevention    PT Home Exercise Plan  09/10/17 - Seated marching, toes raises, and heel raises; 09/17/17: long arc quads 2 x 10 repetitions each lower extremity 1x/day; 09/26/17: seated hip abduction with red theraband 2 x 10 3x/week; 10/01/17 - handout on sit to stand transfers    Consulted and Agree with Plan of Care  Patient;Family member/caregiver       Patient will benefit from skilled therapeutic intervention in order to improve the following deficits and impairments:  Abnormal gait, Decreased balance, Decreased endurance, Decreased mobility, Difficulty walking, Dizziness, Improper body mechanics, Obesity, Decreased  activity tolerance, Decreased knowledge of use of DME, Decreased strength, Postural dysfunction, Pain  Visit Diagnosis: S/P evacuation of subdural hematoma  Muscle weakness (generalized)  Other symptoms and signs involving the musculoskeletal system     Problem List Patient Active Problem List   Diagnosis Date Noted  . RLQ abdominal pain 08/15/2016  . Diabetic complication ([place]) 37/48/2707  . Mucosal abnormality of stomach   . Diverticulosis of colon without hemorrhage   . Dysphagia, pharyngoesophageal phase   . Esophageal dysphagia 12/02/2014  . LLQ pain 02/22/2014  . Abdominal pain, epigastric 02/22/2014  . Encounter for therapeutic drug monitoring 05/18/2013  . Adenomatous polyps 11/21/2011  . Long term (current) use of anticoagulants 07/03/2010  . Permanent atrial fibrillation ([place]) 08/04/2009  . RENAL DISEASE, CHRONIC, STAGE III 08/04/2009  . IBS 04/14/2009  . Diabetes mellitus with stage 3 chronic kidney disease ([place]) 02/17/2009  . ANXIETY 02/17/2009  . GERD 02/17/2009  . DIARRHEA, CHRONIC 02/17/2009  . Mixed hyperlipidemia 12/21/2008  . Essential hypertension, benign 12/21/2008   Clarene Critchley PT, DPT 12:03 PM, 10/24/17 [place], [place], [address] Phone: [phone]   Fax:  [phone]  Name: [name] MRN: [id number] Date of Birth: 1935-01-12

## 2017-10-29 ENCOUNTER — Ambulatory Visit (HOSPITAL_COMMUNITY): Payer: Medicare Other | Admitting: Physical Therapy

## 2017-10-29 ENCOUNTER — Encounter (HOSPITAL_COMMUNITY): Payer: Self-pay | Admitting: Physical Therapy

## 2017-10-29 DIAGNOSIS — Z8679 Personal history of other diseases of the circulatory system: Secondary | ICD-10-CM

## 2017-10-29 DIAGNOSIS — M6281 Muscle weakness (generalized): Secondary | ICD-10-CM

## 2017-10-29 DIAGNOSIS — Z9889 Other specified postprocedural states: Principal | ICD-10-CM

## 2017-10-29 DIAGNOSIS — R29898 Other symptoms and signs involving the musculoskeletal system: Secondary | ICD-10-CM | POA: Diagnosis not present

## 2017-10-29 DIAGNOSIS — R41841 Cognitive communication deficit: Secondary | ICD-10-CM | POA: Diagnosis not present

## 2017-10-29 NOTE — Patient Instructions (Signed)
  [place] STANCE WITH SUPPORT Stand in front of a counter top for support. Then place the heel of one foot so that it is touching the toes of the other foot. Maintain your balance in this position. Repeat 3 Times Hold 30 Seconds Complete 1 Set Perform 1 Time(s) a Day   [place] Stand with feet together in a corner in case you lose your balance, you have the walls for support. Cross arms across your chest and hold this position eyes open for 30 seconds. Repeat with eyes closed. Repeat 3 Times Hold 30 Seconds Complete 1 Set Perform 1 Time(s) a Day   STANDING MARCHING - SINGLE LEG While standing, lift your foot and knee up, set it down and then repeat on the same side. Use your arms for support if needed for balance and safety. (use countertop as needed) 20x alternating legs 3 second holds Repeat 20 Times Hold 3 Seconds Complete 1 Set Perform 1 Time(s) a Day

## 2017-10-29 NOTE — Therapy (Signed)
[place] [facility] [place] [address] [place], [place], [address] Phone: [phone]   Fax:  [phone]  Physical Therapy Treatment / Re-assessment / Discharge Summary  Patient Details  Name: [name] MRN: [id number] Date of Birth: February 05, 1935 Referring Provider: Dr. Caren Macadam   Encounter Date: 10/29/2017   Progress Note Reporting Period 10/01/17 to 10/29/17  See note below for Objective Data and Assessment of Progress/Goals.       PT End of Session - 10/29/17 1200    Visit Number  11    Number of Visits  17    Date for PT Re-Evaluation  10/31/17 Mini-reassess 10/01/17    Authorization Type  Primary: Medicare; Secondary: [employer]    Authorization Time Period  09/03/17 - 10/31/17    Authorization - Visit Number  5    Authorization - Number of Visits  10    PT Start Time  1125    PT Stop Time  1155    PT Time Calculation (min)  30 min    Equipment Utilized During Treatment  Gait belt    Activity Tolerance  Patient tolerated treatment well;Patient limited by fatigue    Behavior During Therapy  WFL for tasks assessed/performed       Past Medical History:  Diagnosis Date  . Anxiety   . Asthma   . Chronic back pain   . Colonoscopy causing post-procedural bleeding    Incomplete. by Dr. [name] 06/11/99 due to fixed non-compliant colon precluded exam to 40 cm, she had subsequent colectomy for diverticulitis since that time.   . Diarrhea   . Esophageal reflux   . Essential hypertension   . Fibromyalgia   . Hiatal hernia    Recent EGD/TCS showed small hiatal hernia, normal apperaring tubular esophagus. s/p passage of a 54-french Maloney dilator, s/p biopsy esophageal mucosa, multiple fundal gland type gastric polyps. one large pedunculated polp with oozing, noted s/p clipping and snare polypectomy. Biopsies of the gastric mucosa taken given her symptoms in her elevated count. normal D1-D3, s/p biospy D2-D3.   [name] Hiatal hernia    all bx  unremarkable. on TCS she had left-sided diverticula, evidence of prior segmental resection with anastomosis, multiple colonic polyps s/p multiple snare polypectomies, s/p segmental biopsy and stool sampling. she had multiple tubular adenomas. no microscopic/collagenous colitis. stool culture, c diff, O+P, lactoferrin were negative.   . Mixed hyperlipidemia   . Nephrolithiasis   . OSA (obstructive sleep apnea)   . Permanent atrial fibrillation (HCC)    Previous failed DCCV  . Type 2 diabetes mellitus ([place])   . Ureteral stent retained    Not retained. ureteral stent removal gross hematuria resolved 10/11. coumadin restarted.   . Ventral hernia     Past Surgical History:  Procedure Laterality Date  . BREAST LUMPECTOMY     benign cyst  . CATARACT EXTRACTION W/PHACO Left 08/17/2012   Procedure: CATARACT EXTRACTION PHACO AND INTRAOCULAR LENS PLACEMENT (IOC);  Surgeon: [name], MD;  Location: [facility];  Service: Ophthalmology;  Laterality: Left;  CDE:18.73  . COLECTOMY     2005. Dr. Geoffry Paradise for diverticulitis  . COLONOSCOPY  02/22/09   normal/left-sided diverticula/multiple colonic polyp, adenomatous  . COLONOSCOPY  12/16/2011   colonic polyps-treated as described above. Status postprior sigmoid resection. adenomatous. next TCS 12/2014  . COLONOSCOPY N/A 12/28/2014   Status post prior segmental resection. Few residual colonic diverticula- status post segmental biopsy negative random colon biopsies  . ESOPHAGEAL DILATION N/A 12/28/2014  Procedure: ESOPHAGEAL DILATION;  Surgeon: [name], MD;  Location: [facility];  Service: Endoscopy;  Laterality: N/A;  . ESOPHAGOGASTRODUODENOSCOPY  02/22/09   normal s/p dilator;small HH/one large pedunculates polyp  s/p clipping, hyperplastic  . ESOPHAGOGASTRODUODENOSCOPY  12/16/2011   [name]: small hiatal hernia. Hyperplastic apperating polyps. Status post [name] dilation as described above.  . ESOPHAGOGASTRODUODENOSCOPY N/A 12/28/2014   RMR:  Normal-appearing esophagus status post passage of a maloney dilator. Hiatal hernia. abnormal gastic mucosa of uncertain significance status post gastric biopsy with mild chronic gastritis, no H pylori.   . TONSILLECTOMY    . TOTAL ABDOMINAL HYSTERECTOMY      There were no vitals filed for this visit.  Subjective Assessment - 10/29/17 1137    Subjective  Patient reported that she is having a headache today which she would rate as an 8/10. She said she feels ready for discharge at this time, because she feels like she's improved as much as she's going to right now.     Pertinent History  Fall down 21 steps, SDH and craniotomy to correct this on 07/26/17. C2 fractures. History of DMII and HTN.     Limitations  Walking;Standing;House hold activities    How long can you sit comfortably?  Not limited    How long can you stand comfortably?  5 minutes    How long can you walk comfortably?  200 feet    Patient Stated Goals  Get back to doing regular household activities    Currently in Pain?  Yes    Pain Score  8     Pain Location  Head    Pain Descriptors / Indicators  Headache    Pain Type  Chronic pain    Pain Onset  More than a month ago    Pain Frequency  Intermittent    Aggravating Factors   stress    Pain Relieving Factors  medicine    Multiple Pain Sites  No         [facility] PT Assessment - 10/29/17 0001      Assessment   Medical Diagnosis  s/p SDH    Referring Provider  Dr. Caren Macadam    Onset Date/Surgical Date  07/26/17      Observation/Other Assessments   Focus on Therapeutic Outcomes (FOTO)   31% (69% limited) (was 23% 77% limited)      Strength   Right Hip Flexion  4+/5 was 4/5    Right Hip Extension  -- Max resistance in sitting    Right Hip ABduction  -- Max resistance in sitting    Right Hip ADduction  -- Max resistance in sitting    Left Hip Flexion  4+/5 was 4/5    Left Hip Extension  -- Max resistance in sitting    Left Hip ABduction  -- Max resistance in  sitting    Left Hip ADduction  -- Max resistance in sitting    Right Knee Flexion  5/5    Right Knee Extension  5/5    Left Knee Flexion  5/5    Left Knee Extension  5/5    Right Ankle Dorsiflexion  5/5    Left Ankle Dorsiflexion  5/5      Transfers   Sit to Stand  6: Modified independent (Device/Increase time)    Stand Pivot Transfers  6: Modified independent (Device/Increase time)      Ambulation/Gait   Ambulation Distance (Feet)  202 Feet 2 MWT    Assistive  device  Rolling walker                   [facility] Adult PT Treatment/Exercise - 10/29/17 0001      Ambulation/Gait   Ambulation/Gait  Yes    Gait Pattern  Step-through pattern;Decreased step length - right;Decreased step length - left;Decreased stride length;Decreased hip/knee flexion - right;Decreased hip/knee flexion - left;Trunk flexed    Ambulation Surface  Level;Indoor    Gait velocity  0.51 m/s             PT Education - 10/29/17 1159    Education Details  Educated patient and her son on examination findings, exercises to perform at home, about senior center, and plans for discharge.     Person(s) Educated  Patient;Child(ren) Son    Methods  Explanation;Handout    Comprehension  Verbalized understanding       PT Short Term Goals - 10/29/17 1205      PT SHORT TERM GOAL #1   Title  Patient will demonstrate understanding and report regular compliance with HEP in order to improve functional mobility and return to prior level of function.     Time  4    Period  Weeks    Status  Achieved      PT SHORT TERM GOAL #2   Title  Patient will demonstrate ability to perform stand pivot transfer with no more than supervision assistance.     Time  4    Period  Weeks    Status  Achieved      PT SHORT TERM GOAL #3   Title  Patient will demonstrate ability to ambulate 80 feet with least retrictive assistive device and no more than supervision assistance in order to perform household ambulation with improved  safety.     Time  4    Period  Weeks    Status  Achieved        PT Long Term Goals - 10/29/17 1205      PT LONG TERM GOAL #1   Title  Patient will demonstrate ability to ambulate 180 feet with least retrictive assistive device and no more than supervision assistance in order to perform household ambulation with improved safety at a velocity of 0.8 m/s.     Baseline  10/29/17 - 0.51 m/s for 202' with RW    Time  8    Period  Weeks    Status  Partially Met      PT LONG TERM GOAL #2   Title  Patient will report ability to stand for 20 minutes in order to prepare a meal.     Baseline  10/29/17: Patient reported ability to stand for 5 minutes.     Time  8    Period  Weeks    Status  On-going      PT LONG TERM GOAL #3   Title  If cleared with spinal precautions, patient will demonstrate ability to perform bed mobility including rolling and supine to sit with no more than supervision assistance.     Baseline  10/29/17: Unable to test at this time due to patient's precautions.     Time  8    Period  Weeks    Status  Deferred            Plan - 10/29/17 1225    Clinical Impression Statement  This session performed a re-assessment of patient's progress toward goals. Patient had achieved 3 out of 3 short term  goals. Patient had partially met 1 out of 3 of her long term goals, 1 goal was ongoing and 1 was deferred due to patient's precautions. Patient continued to demonstrate deficits in gait speed at this evaluation, and with balance, however patient had not demonstrated significant improvements since the last re-assessment in these areas. After discussing examination findings with the patient and her son discussed that patient would like to be discharged at this time as she feels she has plateaued with her progress. Patient was educated on additional exercises to perform and about the local senior center. Patient is being discharged at this time.     Rehab Potential  Fair    Clinical  Impairments Affecting Rehab Potential  Positive: family support, motivated. Negative: Complexity of medical conditions    PT Frequency  2x / week    PT Duration  8 weeks    PT Treatment/Interventions  ADLs/Self Care Home Management;DME Instruction;Gait training;Stair training;Functional mobility training;Therapeutic activities;Therapeutic exercise;Balance training;Neuromuscular re-education;Patient/family education;Orthotic Fit/Training;Wheelchair mobility training;Manual techniques;Passive range of motion;Energy conservation    PT Next Visit Plan  Discharged    PT Home Exercise Plan  09/10/17 - Seated marching, toes raises, and heel raises; 09/17/17: long arc quads 2 x 10 repetitions each lower extremity 1x/day; 09/26/17: seated hip abduction with red theraband 2 x 10 3x/week; 10/01/17 - handout on sit to stand transfers; 10/29/17: Tandem stance at counter 3 x 30 second holds each leg, Marching 3'' holds x 20 alternating, NBOS eyes closed 3 x 30 seconds all 1x/day    Consulted and Agree with Plan of Care  Patient;Family member/caregiver    Family Member Consulted  Patient's son       Patient will benefit from skilled therapeutic intervention in order to improve the following deficits and impairments:  Abnormal gait, Decreased balance, Decreased endurance, Decreased mobility, Difficulty walking, Dizziness, Improper body mechanics, Obesity, Decreased activity tolerance, Decreased knowledge of use of DME, Decreased strength, Postural dysfunction, Pain  Visit Diagnosis: S/P evacuation of subdural hematoma  Muscle weakness (generalized)  Other symptoms and signs involving the musculoskeletal system     Problem List Patient Active Problem List   Diagnosis Date Noted  . RLQ abdominal pain 08/15/2016  . Diabetic complication ([place]) 40/01/2724  . Mucosal abnormality of stomach   . Diverticulosis of colon without hemorrhage   . Dysphagia, pharyngoesophageal phase   . Esophageal dysphagia 12/02/2014   . LLQ pain 02/22/2014  . Abdominal pain, epigastric 02/22/2014  . Encounter for therapeutic drug monitoring 05/18/2013  . Adenomatous polyps 11/21/2011  . Long term (current) use of anticoagulants 07/03/2010  . Permanent atrial fibrillation ([place]) 08/04/2009  . RENAL DISEASE, CHRONIC, STAGE III 08/04/2009  . IBS 04/14/2009  . Diabetes mellitus with stage 3 chronic kidney disease ([place]) 02/17/2009  . ANXIETY 02/17/2009  . GERD 02/17/2009  . DIARRHEA, CHRONIC 02/17/2009  . Mixed hyperlipidemia 12/21/2008  . Essential hypertension, benign 12/21/2008    PHYSICAL THERAPY DISCHARGE SUMMARY  Visits from Start of Care: 11  Current functional level related to goals / functional outcomes: See above   Remaining deficits: See above   Education / Equipment: See above Plan: Patient agrees to discharge.  Patient goals were partially met. Patient is being discharged due to the patient's request.  ?????          Clarene Critchley PT, DPT 12:29 PM, 10/29/17 [facility] [address] [place], [place], [address] Phone: [phone]   Fax:  [phone]  Name:  KATERA RYBKA MRN: [id number] Date of Birth: [birth year]

## 2017-10-30 ENCOUNTER — Other Ambulatory Visit: Payer: Self-pay

## 2017-11-01 ENCOUNTER — Encounter (HOSPITAL_COMMUNITY): Payer: Self-pay | Admitting: Emergency Medicine

## 2017-11-01 ENCOUNTER — Emergency Department (HOSPITAL_COMMUNITY)
Admission: EM | Admit: 2017-11-01 | Discharge: 2017-11-01 | Disposition: A | Discharge status: Home / Self Care | Payer: Medicare Other | Attending: Emergency Medicine | Admitting: Emergency Medicine

## 2017-11-01 ENCOUNTER — Other Ambulatory Visit: Payer: Self-pay

## 2017-11-01 ENCOUNTER — Emergency Department (HOSPITAL_COMMUNITY): Payer: Medicare Other

## 2017-11-01 DIAGNOSIS — K625 Hemorrhage of anus and rectum: Secondary | ICD-10-CM | POA: Diagnosis not present

## 2017-11-01 DIAGNOSIS — J45909 Unspecified asthma, uncomplicated: Secondary | ICD-10-CM | POA: Diagnosis not present

## 2017-11-01 DIAGNOSIS — Z79899 Other long term (current) drug therapy: Secondary | ICD-10-CM | POA: Insufficient documentation

## 2017-11-01 DIAGNOSIS — K5900 Constipation, unspecified: Secondary | ICD-10-CM | POA: Diagnosis not present

## 2017-11-01 DIAGNOSIS — I129 Hypertensive chronic kidney disease with stage 1 through stage 4 chronic kidney disease, or unspecified chronic kidney disease: Secondary | ICD-10-CM | POA: Diagnosis not present

## 2017-11-01 DIAGNOSIS — R1031 Right lower quadrant pain: Secondary | ICD-10-CM | POA: Diagnosis not present

## 2017-11-01 DIAGNOSIS — Z794 Long term (current) use of insulin: Secondary | ICD-10-CM | POA: Insufficient documentation

## 2017-11-01 DIAGNOSIS — E119 Type 2 diabetes mellitus without complications: Secondary | ICD-10-CM | POA: Insufficient documentation

## 2017-11-01 DIAGNOSIS — R109 Unspecified abdominal pain: Secondary | ICD-10-CM | POA: Diagnosis not present

## 2017-11-01 DIAGNOSIS — N183 Chronic kidney disease, stage 3 (moderate): Secondary | ICD-10-CM | POA: Diagnosis not present

## 2017-11-01 LAB — URINALYSIS, ROUTINE W REFLEX MICROSCOPIC
Bilirubin Urine: NEGATIVE
GLUCOSE, UA: NEGATIVE mg/dL
Hgb urine dipstick: NEGATIVE
Ketones, ur: 5 mg/dL — AB
Leukocytes, UA: NEGATIVE
NITRITE: NEGATIVE
PH: 7 (ref 5.0–8.0)
PROTEIN: 100 mg/dL — AB
Specific Gravity, Urine: 1.014 (ref 1.005–1.030)

## 2017-11-01 LAB — CBC WITH DIFFERENTIAL/PLATELET
Basophils Absolute: 0 10*3/uL (ref 0.0–0.1)
Basophils Relative: 0 %
EOS ABS: 0 10*3/uL (ref 0.0–0.7)
Eosinophils Relative: 0 %
HCT: 34.9 % — ABNORMAL LOW (ref 36.0–46.0)
HEMOGLOBIN: 10.4 g/dL — AB (ref 12.0–15.0)
LYMPHS ABS: 1 10*3/uL (ref 0.7–4.0)
Lymphocytes Relative: 12 %
MCH: 22.9 pg — AB (ref 26.0–34.0)
MCHC: 29.8 g/dL — AB (ref 30.0–36.0)
MCV: 76.7 fL — ABNORMAL LOW (ref 78.0–100.0)
MONO ABS: 0.3 10*3/uL (ref 0.1–1.0)
MONOS PCT: 4 %
Neutro Abs: 6.8 10*3/uL (ref 1.7–7.7)
Neutrophils Relative %: 84 %
Platelets: 286 10*3/uL (ref 150–400)
RBC: 4.55 MIL/uL (ref 3.87–5.11)
RDW: 16.9 % — ABNORMAL HIGH (ref 11.5–15.5)
WBC: 8.1 10*3/uL (ref 4.0–10.5)

## 2017-11-01 LAB — TYPE AND SCREEN
ABO/RH(D): A POS
Antibody Screen: NEGATIVE

## 2017-11-01 LAB — COMPREHENSIVE METABOLIC PANEL
ALK PHOS: 67 U/L (ref 38–126)
ALT: 7 U/L (ref 0–44)
ANION GAP: 7 (ref 5–15)
AST: 13 U/L — ABNORMAL LOW (ref 15–41)
Albumin: 3.7 g/dL (ref 3.5–5.0)
BUN: 12 mg/dL (ref 8–23)
CALCIUM: 9.4 mg/dL (ref 8.9–10.3)
CO2: 29 mmol/L (ref 22–32)
Chloride: 102 mmol/L (ref 98–111)
Creatinine, Ser: 1.18 mg/dL — ABNORMAL HIGH (ref 0.44–1.00)
GFR calc non Af Amer: 42 mL/min — ABNORMAL LOW (ref >60)
GFR, EST AFRICAN AMERICAN: 49 mL/min — AB (ref >60)
Glucose, Bld: 167 mg/dL — ABNORMAL HIGH (ref 70–99)
Potassium: 4.3 mmol/L (ref 3.5–5.1)
SODIUM: 138 mmol/L (ref 135–145)
TOTAL PROTEIN: 7.5 g/dL (ref 6.5–8.1)
Total Bilirubin: 0.8 mg/dL (ref 0.3–1.2)

## 2017-11-01 LAB — LIPASE, BLOOD: Lipase: 35 U/L (ref 11–51)

## 2017-11-01 LAB — POC OCCULT BLOOD, ED: FECAL OCCULT BLD: NEGATIVE

## 2017-11-01 NOTE — ED Notes (Signed)
Patient transported to CT 

## 2017-11-01 NOTE — ED Notes (Signed)
Urine clean catch. Not catherized.

## 2017-11-01 NOTE — Discharge Instructions (Addendum)
As discussed, your lab tests, exam and CT scan are reassuring today.  Your hemoglobin is low as discussed but in comparison to other labs this test is stable and probably related to chronic disease as discussed and not from your recent bleeding.  The best way to avoid constipation is to take a daily medication, you may continue using your Benefiber, but take it daily instead of waiting until you get constipated.  Return here for any worsening symptoms.

## 2017-11-01 NOTE — ED Provider Notes (Signed)
[facility] EMERGENCY DEPARTMENT Provider Note   CSN: [id number] Arrival date & time: 11/01/17  1030     History   Chief Complaint Chief Complaint  Patient presents with  . Abdominal Pain    HPI [name] is a 83 y.o. female with a past medical history as outlined below, most significant for reflux disease, hiatal hernia, type 2 diabetes, atrial fibrillation who is not currently anticoagulated secondary to a fall occurring 4 months ago which resulted in a subdural hematoma and C-spine fracture x2 presenting with abdominal pain and rectal bleeding.  She has had a 1 month history of intermittent rectal bleeding associated with bowel movements with last episode occurring yesterday evening.  She describes having problems with constipation and states 2 days ago she was having frequent diarrhea, then yesterday evening had a very large hard stool followed by bright red blood which has since resolved.  She had severe pain with this bowel movement but denies further rectal pain or bleeding.  She does have right lower quadrant pain which has been present for several days which is constant, crampy.  She denies nausea or vomiting and has had no fevers or chills.  Surgical history is significant for diverticulitis followed by a sigmoid colectomy in 2005.  She denies weakness, lightheadedness.  She contacted her GI specialist Dr. Roseanne Kaufman office for an appointment which is not scheduled until September.   Abdominal Pain   Associated symptoms include diarrhea and constipation. Pertinent negatives include fever, nausea, vomiting, headaches and arthralgias.    Past Medical History:  Diagnosis Date  . Anxiety   . Asthma   . Chronic back pain   . Colonoscopy causing post-procedural bleeding    Incomplete. by Dr. [name] 06/11/99 due to fixed non-compliant colon precluded exam to 40 cm, she had subsequent colectomy for diverticulitis since that time.   . Diarrhea   . Esophageal reflux   . Essential  hypertension   . Fibromyalgia   . Hiatal hernia    Recent EGD/TCS showed small hiatal hernia, normal apperaring tubular esophagus. s/p passage of a 54-french Maloney dilator, s/p biopsy esophageal mucosa, multiple fundal gland type gastric polyps. one large pedunculated polp with oozing, noted s/p clipping and snare polypectomy. Biopsies of the gastric mucosa taken given her symptoms in her elevated count. normal D1-D3, s/p biospy D2-D3.   [name] Hiatal hernia    all bx unremarkable. on TCS she had left-sided diverticula, evidence of prior segmental resection with anastomosis, multiple colonic polyps s/p multiple snare polypectomies, s/p segmental biopsy and stool sampling. she had multiple tubular adenomas. no microscopic/collagenous colitis. stool culture, c diff, O+P, lactoferrin were negative.   . Mixed hyperlipidemia   . Nephrolithiasis   . OSA (obstructive sleep apnea)   . Permanent atrial fibrillation (HCC)    Previous failed DCCV  . Type 2 diabetes mellitus ([place])   . Ureteral stent retained    Not retained. ureteral stent removal gross hematuria resolved 10/11. coumadin restarted.   . Ventral hernia     Patient Active Problem List   Diagnosis Date Noted  . RLQ abdominal pain 08/15/2016  . Diabetic complication ([place]) 63/33/5456  . Mucosal abnormality of stomach   . Diverticulosis of colon without hemorrhage   . Dysphagia, pharyngoesophageal phase   . Esophageal dysphagia 12/02/2014  . LLQ pain 02/22/2014  . Abdominal pain, epigastric 02/22/2014  . Encounter for therapeutic drug monitoring 05/18/2013  . Adenomatous polyps 11/21/2011  . Long term (current) use of anticoagulants 07/03/2010  .  Permanent atrial fibrillation ([employer]) 08/04/2009  . RENAL DISEASE, CHRONIC, STAGE III 08/04/2009  . IBS 04/14/2009  . Diabetes mellitus with stage 3 chronic kidney disease ([place]) 02/17/2009  . ANXIETY 02/17/2009  . GERD 02/17/2009  . DIARRHEA, CHRONIC 02/17/2009  . Mixed hyperlipidemia  12/21/2008  . Essential hypertension, benign 12/21/2008    Past Surgical History:  Procedure Laterality Date  . BREAST LUMPECTOMY     benign cyst  . CATARACT EXTRACTION W/PHACO Left 08/17/2012   Procedure: CATARACT EXTRACTION PHACO AND INTRAOCULAR LENS PLACEMENT (IOC);  Surgeon: [name], MD;  Location: [facility] ORS;  Service: Ophthalmology;  Laterality: Left;  CDE:18.73  . COLECTOMY     2005. Dr. Geoffry Paradise for diverticulitis  . COLONOSCOPY  02/22/09   normal/left-sided diverticula/multiple colonic polyp, adenomatous  . COLONOSCOPY  12/16/2011   colonic polyps-treated as described above. Status postprior sigmoid resection. adenomatous. next TCS 12/2014  . COLONOSCOPY N/A 12/28/2014   Status post prior segmental resection. Few residual colonic diverticula- status post segmental biopsy negative random colon biopsies  . ESOPHAGEAL DILATION N/A 12/28/2014   Procedure: ESOPHAGEAL DILATION;  Surgeon: [name], MD;  Location: [facility] ENDO SUITE;  Service: Endoscopy;  Laterality: N/A;  . ESOPHAGOGASTRODUODENOSCOPY  02/22/09   normal s/p dilator;small HH/one large pedunculates polyp  s/p clipping, hyperplastic  . ESOPHAGOGASTRODUODENOSCOPY  12/16/2011   [name]: small hiatal hernia. Hyperplastic apperating polyps. Status post [name] dilation as described above.  . ESOPHAGOGASTRODUODENOSCOPY N/A 12/28/2014   RMR: Normal-appearing esophagus status post passage of a maloney dilator. Hiatal hernia. abnormal gastic mucosa of uncertain significance status post gastric biopsy with mild chronic gastritis, no H pylori.   . TONSILLECTOMY    . TOTAL ABDOMINAL HYSTERECTOMY       OB History   None      Home Medications    Prior to Admission medications   Medication Sig Start Date End Date Taking? Authorizing Provider  acetaminophen (TYLENOL) 325 MG tablet Take 650 mg by mouth every 6 (six) hours as needed for pain.    Yes [provider]  ALPRAZolam Duanne Moron) 0.5 MG tablet Take 0.5 mg by mouth 2 (two)  times daily. *May take one and one-half tablet at bedtime as needed   Yes [provider]  amLODipine (NORVASC) 2.5 MG tablet Take 1 tablet (2.5 mg total) by mouth daily. 09/16/17 12/15/17 Yes [name], MD  Continuous Blood Gluc Sensor (FREESTYLE LIBRE SENSOR SYSTEM) MISC Use one sensor every 10 days. 03/04/17  Yes Nida, Marella Chimes, MD  Insulin Glargine (LANTUS SOLOSTAR) 100 UNIT/ML Solostar Pen INJECT 20 UNITS INTO THE SKIN DAILY AT BREAKFAST Patient taking differently: Inject 15 Units into the skin every morning.  09/09/17  Yes Nida, Marella Chimes, MD  insulin lispro (HUMALOG) 100 UNIT/ML injection Inject 3 Units into the skin. Sliding scale   Yes [provider]  metoprolol tartrate (LOPRESSOR) 25 MG tablet TAKE 1 TABLET BY MOUTH TWICE DAILY 10/16/17  Yes [name], MD  ondansetron (ZOFRAN-ODT) 4 MG disintegrating tablet Take 4 mg by mouth every 8 (eight) hours as needed. Nausea and Vomiting 11/11/11  Yes [provider]  Oxycodone HCl 10 MG TABS Take 10 mg by mouth every 4 (four) hours.  08/08/16  Yes [provider]  pantoprazole (PROTONIX) 40 MG tablet TAKE ONE TABLET BY MOUTH TWICE DAILY 04/23/17  Yes Carlis Stable, NP  simvastatin (ZOCOR) 20 MG tablet Take 1 tablet by mouth daily. 08/03/13  Yes [provider]  tizanidine (ZANAFLEX) 2  MG capsule Take 2 mg by mouth 3 (three) times daily as needed for muscle spasms.    Yes [provider]  Vitamin D, Ergocalciferol, (DRISDOL) 50000 units CAPS capsule Take 50,000 Units by mouth once a week. 06/27/17  Yes [provider]  nitroGLYCERIN (NITROSTAT) 0.4 MG SL tablet PLACE 1 TABLET UNDER THE TONGUE EVERY 5 MINUTES FOR 3 DOSES AS NEEDED FOR CHEST PAIN. IF YOU HAVE NO RELIEF AFTER THE 3RD DOSE PROCEED TO [facility] 12/02/16   [name], MD  VICTOZA 18 MG/3ML SOPN Inject 1.8 mg into the skin daily. 09/26/17   [provider]    Family History Family History  Problem  Relation Age of Onset  . Hypertension Father   . Diabetes Father   . Heart attack Father   . Heart attack Mother   . Diabetes Mother   . Hypertension Mother   . Depression Son   . Pancreatitis Son   . Coronary artery disease Unknown        Family Hx   . Colon cancer Maternal Grandfather   . Heart disease Sister   . Mental retardation Brother   . Hernia Son     Social History Social History   Tobacco Use  . Smoking status: Never Smoker  . Smokeless tobacco: Never Used  . Tobacco comment: tobacco use - no  Substance Use Topics  . Alcohol use: No    Alcohol/week: 0.0 oz  . Drug use: No     Allergies   Adhesive [tape]; Demerol [meperidine]; Iohexol; Meperidine hcl; Shellfish allergy; and Penicillins   Review of Systems Review of Systems  Constitutional: Negative for fever.  HENT: Negative for congestion and sore throat.   Eyes: Negative.   Respiratory: Negative for chest tightness and shortness of breath.   Cardiovascular: Negative for chest pain.  Gastrointestinal: Positive for abdominal pain, blood in stool, constipation and diarrhea. Negative for nausea and vomiting.  Genitourinary: Negative.   Musculoskeletal: Negative for arthralgias, joint swelling and neck pain.  Skin: Negative.  Negative for rash and wound.  Neurological: Negative for dizziness, weakness, light-headedness, numbness and headaches.  Psychiatric/Behavioral: Negative.      Physical Exam Updated Vital Signs BP (!) 153/75 (BP Location: Right Arm)   Pulse 74   Temp 98.8 F (37.1 C) (Oral)   Resp 16   Ht 5\' 3"  (1.6 m)   Wt 95.3 kg (210 lb)   SpO2 100%   BMI 37.20 kg/m   Physical Exam  Constitutional: She appears well-developed and well-nourished.  Non-toxic appearance. She does not appear ill.  Presents in [employer] cervical collar.  HENT:  Head: Normocephalic and atraumatic.  Eyes: Conjunctivae are normal.  Neck: Normal range of motion.  Cardiovascular: Normal rate, regular rhythm,  normal heart sounds and intact distal pulses.  Pulmonary/Chest: Effort normal and breath sounds normal. She has no wheezes.  Abdominal: Soft. Bowel sounds are normal. She exhibits no distension, no abdominal bruit and no mass. There is tenderness in the right lower quadrant. There is no rigidity, no rebound and no guarding.  No increased tympany to percussion.  She is tender to palpation in her right lower quadrant without guarding or rebound.  Genitourinary: Rectum normal. Rectal exam shows no external hemorrhoid, no internal hemorrhoid, no fissure, no mass, no tenderness and guaiac negative stool.  Genitourinary Comments: Small external anal tag. No obvious anal fissure.  Musculoskeletal: Normal range of motion.  Neurological: She is alert.  Skin: Skin is warm and dry.  Psychiatric: She has a normal mood and affect.  Nursing note and vitals reviewed.    ED Treatments / Results  Labs (all labs ordered are listed, but only abnormal results are displayed) Labs Reviewed  CBC WITH DIFFERENTIAL/PLATELET - Abnormal; Notable for the following components:      Result Value   Hemoglobin 10.4 (*)    HCT 34.9 (*)    MCV 76.7 (*)    MCH 22.9 (*)    MCHC 29.8 (*)    RDW 16.9 (*)    All other components within normal limits  COMPREHENSIVE METABOLIC PANEL - Abnormal; Notable for the following components:   Glucose, Bld 167 (*)    Creatinine, Ser 1.18 (*)    AST 13 (*)    GFR calc non Af Amer 42 (*)    GFR calc Af Amer 49 (*)    All other components within normal limits  URINALYSIS, ROUTINE W REFLEX MICROSCOPIC - Abnormal; Notable for the following components:   APPearance HAZY (*)    Ketones, ur 5 (*)    Protein, ur 100 (*)    Bacteria, UA RARE (*)    All other components within normal limits  LIPASE, BLOOD  OCCULT BLOOD X 1 CARD TO LAB, STOOL  POC OCCULT BLOOD, ED  TYPE AND SCREEN    EKG None  Radiology Ct Abdomen Pelvis Wo Contrast  Result Date: 11/01/2017 CLINICAL DATA:   Right-sided abdominal pain for 1 week, bright red blood per rectum EXAM: CT ABDOMEN AND PELVIS WITHOUT CONTRAST TECHNIQUE: Multidetector CT imaging of the abdomen and pelvis was performed following the standard protocol without IV contrast. COMPARISON:  08/27/2016 FINDINGS: Lower chest: Mild scarring is noted in the bases bilaterally. Hepatobiliary: No focal liver abnormality is seen. No gallstones, gallbladder wall thickening, or biliary dilatation. Pancreas: Unremarkable. No pancreatic ductal dilatation or surrounding inflammatory changes. Spleen: Normal in size without focal abnormality. Adrenals/Urinary Tract: Adrenal glands are within normal limits bilaterally. The kidneys are well visualized. The left kidney demonstrates no renal calculi or obstructive change. The right kidney demonstrates significant hydronephrosis stable from the previous exam which appears to be related to a chronic UPJ obstruction. No calculus is seen. No other focal abnormality is noted. The bladder is partially distended. Stomach/Bowel: The appendix is not well visualized and may have been surgically removed. No inflammatory changes are seen. No obstructive or inflammatory changes of the larger small-bowel are noted. Postsurgical changes are noted in the region of the sigmoid with apparent and decide anastomosis. Vascular/Lymphatic: Aortic atherosclerosis. No enlarged abdominal or pelvic lymph nodes. Reproductive: Status post hysterectomy. No adnexal masses. Other: No abdominal wall hernia or abnormality. Laxity of the anterior abdominal wall is noted however. No abdominopelvic ascites. Musculoskeletal: Degenerative changes of lumbar spine are noted. Chronic anterolisthesis of L5 on S1 is noted. No acute bony abnormality is seen. IMPRESSION: Chronic right UPJ obstruction. No renal calculi are identified. No findings to correspond with the patient's given clinical history are seen. Electronically Signed   By: Inez Catalina M.D.   On:  11/01/2017 14:46    Procedures Procedures (including critical care time)  Medications Ordered in ED Medications - No data to display   Initial Impression / Assessment and Plan / ED Course  I have reviewed the triage vital signs and the nursing notes.  Pertinent labs & imaging results that were available during my care of the patient were reviewed by me and considered in my medical decision making (see chart for details).  Pt with rlq abdominal pain and history of rectal bleeding, hemoccult negative on todays exam.  Labs and CT scan reviewed and discussed with pt. Hemoglobin stable, compared with recent hgb from 5/[facility] at [facility] hgb ranged from 8.7 to 9.5.  Pt is stable today, ambulated in dept.  No rectal bleeding.  Constipation with overflow diarrhea, suspect bleeding either from internal hemorrhoid source or anal fissure, although not appreciated on todays exam. Discussed daily benefiber for better control of constipation (currently taking prn, also on oxycodone for her cervical fracture pain, but stating has been "weaning off").  Strict return precautions discussed. otw plan f/u with Dr. [name].  Dg at bedside will call for appt .   Final Clinical Impressions(s) / ED Diagnoses   Final diagnoses:  Right lower quadrant abdominal pain  Rectal bleeding    ED Discharge Orders    None       Landis Martins 11/01/17 [place], [place], DO 11/05/17 1238

## 2017-11-01 NOTE — ED Notes (Signed)
Pt had large semi formed yellow stool

## 2017-11-01 NOTE — ED Triage Notes (Signed)
Patient c/o right lower abd pain. Patient states constipation x1 week. Patient taking benifiber and had large BM with diarrhea x2 days ago. Patient now reports nausea, vomiting, dizziness, and bright red blood in stool. Patient does report straining for BM. Patient states toilet paper was covered in bright red blood.

## 2017-11-03 ENCOUNTER — Other Ambulatory Visit: Payer: Self-pay | Admitting: Student

## 2017-11-03 DIAGNOSIS — S12191A Other nondisplaced fracture of second cervical vertebra, initial encounter for closed fracture: Secondary | ICD-10-CM

## 2017-11-05 ENCOUNTER — Other Ambulatory Visit: Payer: Self-pay

## 2017-11-07 ENCOUNTER — Other Ambulatory Visit: Payer: Self-pay

## 2017-11-07 ENCOUNTER — Ambulatory Visit (INDEPENDENT_AMBULATORY_CARE_PROVIDER_SITE_OTHER): Payer: Medicare Other | Admitting: Gastroenterology

## 2017-11-07 ENCOUNTER — Encounter: Payer: Self-pay | Admitting: Gastroenterology

## 2017-11-07 VITALS — BP 152/73 | HR 71 | Temp 97.0°F | Ht 63.0 in | Wt 192.4 lb

## 2017-11-07 DIAGNOSIS — K625 Hemorrhage of anus and rectum: Secondary | ICD-10-CM | POA: Insufficient documentation

## 2017-11-07 DIAGNOSIS — I639 Cerebral infarction, unspecified: Secondary | ICD-10-CM

## 2017-11-07 DIAGNOSIS — R198 Other specified symptoms and signs involving the digestive system and abdomen: Secondary | ICD-10-CM | POA: Insufficient documentation

## 2017-11-07 DIAGNOSIS — R1031 Right lower quadrant pain: Secondary | ICD-10-CM | POA: Diagnosis not present

## 2017-11-07 MED ORDER — LUBIPROSTONE 8 MCG PO CAPS: 8.0000 ug | ORAL_CAPSULE | Freq: Every day | ORAL | 1 refills | Status: DC

## 2017-11-07 NOTE — Patient Instructions (Signed)
If you continue to have multiple loose stools over the next 24 hours, take 1/2 imodium tablet.  If you go more than 24 hours without a BM, take one Amitiza with food (maxium of 2 per day). We need to stay ahead on the constipation to try and prevent overflow diarrhea.   If you have recurrent rectal bleeding let me know.   Return to the office in six weeks for follow up.

## 2017-11-07 NOTE — Progress Notes (Signed)
Primary Care Physician: Caren Macadam, MD  Primary Gastroenterologist:  Garfield Cornea, MD   Chief Complaint  Patient presents with  . Abdominal Pain  . Rectal Bleeding    HPI: [name] is a 83 y.o. female here for further evaluation of abdominal pain and rectal bleeding.  We last saw her November 2018.  She has a history of chronic diarrhea going back for decades.  Patient had a fall while visiting family in [place] in May of this year resulting in a subdural hematoma requiring surgical evacuation and also fracture of C2 and C7.  She had been on Coumadin at that time for stroke prophylaxis with atrial fibrillation, this has subsequently been discontinued.    Patient has had constipation about 3 times since she has been back home in May.  She was in the hospital over a month after her fall.  One time she went 9 days without a bowel movement was significantly impacted.  Previously was taking MiraLAX 1-2 times daily but felt like it made her stools harder to pass due to the pasty texture.  Benefiber 1 teaspoon daily cause her to have diarrhea.  A half a teaspoon a day resulted in less frequent stools but still watery consistency.  Patient was seen in the ED on July 27 for 1 month history of intermittent rectal bleeding associated with constipation.  2 days prior to going to the ED she felt impacted.  Had been more than a week without a BM.  Passed a very hard stool.  Complained of right lower quadrant pain as well.  Mild to moderate rectal bleeding which is why she went to the emergency department.  On rectal exam in the ER she had a small external anal tag.  No obvious fissure.  Stool was Hemoccult negative.  She had a CT abdomen pelvis without contrast (patient refused IV contrast given history of significant vomiting and "I have died last time I got").  She was found to have chronic right UPJ obstruction, no renal stones.  No other findings to explain rectal bleeding.  Her  hemoglobin was felt to be at baseline compared to when she was at [facility] on 5/19.  Patient reports after taking oral contrast for CT she is developed diarrhea.  She has had diarrhea for a couple of days but seems to be slowing down.  Her right lower quadrant pain has resolved.  She does complain that her stomach feels like it is turning all the time.  With regards to rectal bleeding it was noted predominantly with straining and wiping lasted for a couple of days most recently.  The last episode was on Sunday.  Continues to feel nausea, no vomiting.  Appetite poor.  Since her fall she is lost basically 20 pounds.  Her hemoglobin in the ED was 10.4.  Last hemoglobin we have on file was October 2015 was 15.3.  While at [facility] after her fall her hemoglobin on 08/07/2017 was 8.7, on 08/17/2017 was 8.8.   Current Outpatient Medications  Medication Sig Dispense Refill  . acetaminophen (TYLENOL) 325 MG tablet Take 650 mg by mouth every 6 (six) hours as needed for pain.     [name] ALPRAZolam (XANAX) 0.5 MG tablet Take 0.5 mg by mouth 2 (two) times daily. *May take one and one-half tablet at bedtime as needed    . amLODipine (NORVASC) 2.5 MG tablet Take 1 tablet (2.5 mg total) by mouth daily. 30 tablet 1  .  insulin lispro (HUMALOG) 100 UNIT/ML injection Inject 3 Units into the skin. Sliding scale    . metoprolol tartrate (LOPRESSOR) 25 MG tablet TAKE 1 TABLET BY MOUTH TWICE DAILY 60 tablet 1  . nitroGLYCERIN (NITROSTAT) 0.4 MG SL tablet PLACE 1 TABLET UNDER THE TONGUE EVERY 5 MINUTES FOR 3 DOSES AS NEEDED FOR CHEST PAIN. IF YOU HAVE NO RELIEF AFTER THE 3RD DOSE PROCEED TO TH 90 tablet 3  . ondansetron (ZOFRAN-ODT) 4 MG disintegrating tablet Take 4 mg by mouth every 8 (eight) hours as needed. Nausea and Vomiting    . Oxycodone HCl 10 MG TABS Take 10 mg by mouth every 4 (four) hours.   0  . pantoprazole (PROTONIX) 40 MG tablet TAKE ONE TABLET BY MOUTH TWICE DAILY 60 tablet 5  . simvastatin (ZOCOR) 20  MG tablet Take 1 tablet by mouth daily.    . tizanidine (ZANAFLEX) 2 MG capsule Take 2 mg by mouth 3 (three) times daily as needed for muscle spasms.     [name] VICTOZA 18 MG/3ML SOPN Inject 1.8 mg into the skin daily.  2  . Continuous Blood Gluc Sensor (FREESTYLE LIBRE SENSOR SYSTEM) MISC Use one sensor every 10 days. 3 each 2  . Insulin Glargine (LANTUS SOLOSTAR) 100 UNIT/ML Solostar Pen INJECT 20 UNITS INTO THE SKIN DAILY AT BREAKFAST (Patient taking differently: Inject 20 Units into the skin every morning. ) 15 mL 2   No current facility-administered medications for this visit.     Allergies as of 11/07/2017 - Review Complete 11/07/2017  Allergen Reaction Noted  . Adhesive [tape] Itching 11/29/2011  . Demerol [meperidine]  02/22/2014  . Iohexol Nausea And Vomiting 03/08/2004  . Meperidine hcl Nausea And Vomiting   . Shellfish allergy Diarrhea and Nausea And Vomiting 08/03/2012  . Penicillins Nausea And Vomiting and Rash     ROS:  General: positive foranorexia, weight loss, fatigue, weakness. Neg for fever ENT: Negative for hoarseness, difficulty swallowing , nasal congestion. CV: Negative for chest pain, angina, palpitations, dyspnea on exertion, peripheral edema.  Respiratory: Negative for dyspnea at rest, dyspnea on exertion, cough, sputum, wheezing.  GI: See history of present illness. GU:  Negative for dysuria, hematuria, urinary incontinence, urinary frequency, nocturnal urination.  Endo: see hpi   Physical Examination:   BP (!) 152/73   Pulse 71   Temp (!) 97 F (36.1 C)   Ht 5\' 3"  (1.6 m)   Wt 192 lb 6.4 oz (87.3 kg)   BMI 34.08 kg/m   General: elderly frail appearing WF in no acute distress.  Eyes: No icterus. Mouth: Oropharyngeal mucosa moist and pink , no lesions erythema or exudate. Lungs: Clear to auscultation bilaterally.  Heart: Regular rate and rhythm, no murmurs rubs or gallops.  Abdomen: Bowel sounds are normal, nontender, nondistended, no  hepatosplenomegaly or masses, no abdominal bruits or hernia , no rebound or guarding.  Exam performed in wheelchair as patient wasn't able to get on exam table.  Extremities: 1+ lower extremity edema. No clubbing or deformities. Neuro: Alert and oriented x 4   Skin: Warm and dry, no jaundice.   Psych: Alert and cooperative, normal mood and affect.  Labs:  Lab Results  Component Value Date   CREATININE 1.18 (H) 11/01/2017   BUN 12 11/01/2017   NA 138 11/01/2017   K 4.3 11/01/2017   CL 102 11/01/2017   CO2 29 11/01/2017   Lab Results  Component Value Date   ALT 7 11/01/2017   AST 13 (L)  11/01/2017   ALKPHOS 67 11/01/2017   BILITOT 0.8 11/01/2017   Lab Results  Component Value Date   LIPASE 35 11/01/2017   Lab Results  Component Value Date   WBC 8.1 11/01/2017   HGB 10.4 (L) 11/01/2017   HCT 34.9 (L) 11/01/2017   MCV 76.7 (L) 11/01/2017   PLT 286 11/01/2017   Lab Results  Component Value Date   HGBA1C 7.5 (H) 09/04/2017   Lab Results  Component Value Date   TSH 2.47 09/04/2017     Imaging Studies: Ct Abdomen Pelvis Wo Contrast  Result Date: 11/01/2017 CLINICAL DATA:  Right-sided abdominal pain for 1 week, bright red blood per rectum EXAM: CT ABDOMEN AND PELVIS WITHOUT CONTRAST TECHNIQUE: Multidetector CT imaging of the abdomen and pelvis was performed following the standard protocol without IV contrast. COMPARISON:  08/27/2016 FINDINGS: Lower chest: Mild scarring is noted in the bases bilaterally. Hepatobiliary: No focal liver abnormality is seen. No gallstones, gallbladder wall thickening, or biliary dilatation. Pancreas: Unremarkable. No pancreatic ductal dilatation or surrounding inflammatory changes. Spleen: Normal in size without focal abnormality. Adrenals/Urinary Tract: Adrenal glands are within normal limits bilaterally. The kidneys are well visualized. The left kidney demonstrates no renal calculi or obstructive change. The right kidney demonstrates significant  hydronephrosis stable from the previous exam which appears to be related to a chronic UPJ obstruction. No calculus is seen. No other focal abnormality is noted. The bladder is partially distended. Stomach/Bowel: The appendix is not well visualized and may have been surgically removed. No inflammatory changes are seen. No obstructive or inflammatory changes of the larger small-bowel are noted. Postsurgical changes are noted in the region of the sigmoid with apparent and decide anastomosis. Vascular/Lymphatic: Aortic atherosclerosis. No enlarged abdominal or pelvic lymph nodes. Reproductive: Status post hysterectomy. No adnexal masses. Other: No abdominal wall hernia or abnormality. Laxity of the anterior abdominal wall is noted however. No abdominopelvic ascites. Musculoskeletal: Degenerative changes of lumbar spine are noted. Chronic anterolisthesis of L5 on S1 is noted. No acute bony abnormality is seen. IMPRESSION: Chronic right UPJ obstruction. No renal calculi are identified. No findings to correspond with the patient's given clinical history are seen. Electronically Signed   By: Inez Catalina M.D.   On: 11/01/2017 14:46

## 2017-11-10 NOTE — Progress Notes (Signed)
cc'ed to pcp °

## 2017-11-10 NOTE — Assessment & Plan Note (Signed)
83 year old female presenting for further evaluation of rectal bleeding in the setting of constipation, right lower quadrant abdominal pain.  Baseline with chronic diarrhea for decades.  Suffered a fall with prolonged hospitalization and recovery as outlined above.  Since then has had significant constipation.  She has been on oxycodone.  Her abdominal pain is resolved with resolution of constipation.  For the past couple of days she has had diarrhea after taking oral contrast for CT scan.  This seems to be resolving however.  If she continues to have diarrhea for the next 24 hours, she will take a half of Imodium.  Recommend management of constipation if/when it returns.  Will begin Amitiza 8 mcg once to twice daily with food.  Patient's last colonoscopy was 2016.  Suspect recent rectal bleeding due to benign anorectal source in the setting of passing hard stool.  We will have her come back in 6 weeks for follow-up.

## 2017-11-14 ENCOUNTER — Ambulatory Visit
Admission: RE | Admit: 2017-11-14 | Discharge: 2017-11-14 | Disposition: A | Discharge status: Home / Self Care | Payer: Medicare Other | Source: Ambulatory Visit | Attending: Student | Admitting: Student

## 2017-11-14 DIAGNOSIS — S12191A Other nondisplaced fracture of second cervical vertebra, initial encounter for closed fracture: Secondary | ICD-10-CM

## 2017-11-14 DIAGNOSIS — S12100A Unspecified displaced fracture of second cervical vertebra, initial encounter for closed fracture: Secondary | ICD-10-CM | POA: Diagnosis not present

## 2017-11-16 ENCOUNTER — Other Ambulatory Visit: Payer: Self-pay | Admitting: Nurse Practitioner

## 2017-11-18 DIAGNOSIS — R809 Proteinuria, unspecified: Secondary | ICD-10-CM | POA: Diagnosis not present

## 2017-11-18 DIAGNOSIS — E559 Vitamin D deficiency, unspecified: Secondary | ICD-10-CM | POA: Diagnosis not present

## 2017-11-18 DIAGNOSIS — I129 Hypertensive chronic kidney disease with stage 1 through stage 4 chronic kidney disease, or unspecified chronic kidney disease: Secondary | ICD-10-CM | POA: Diagnosis not present

## 2017-11-18 DIAGNOSIS — N183 Chronic kidney disease, stage 3 (moderate): Secondary | ICD-10-CM | POA: Diagnosis not present

## 2017-11-18 DIAGNOSIS — Z79899 Other long term (current) drug therapy: Secondary | ICD-10-CM | POA: Diagnosis not present

## 2017-11-18 DIAGNOSIS — D509 Iron deficiency anemia, unspecified: Secondary | ICD-10-CM | POA: Diagnosis not present

## 2017-11-24 ENCOUNTER — Telehealth: Payer: Self-pay | Admitting: Cardiology

## 2017-11-24 NOTE — Telephone Encounter (Signed)
Patient notified

## 2017-11-24 NOTE — Telephone Encounter (Signed)
Noted.  I am not in a rush to resume anticoagulation until she is completely cleared by her neurosurgeon.

## 2017-11-24 NOTE — Telephone Encounter (Signed)
Patient called stating that she is seeing a Neurlogist on 12-02-2017.  She has re-scheduled her appointment with Dr. Domenic Polite due to not being able to do her echo. She is concerned about her blood thinners.

## 2017-11-24 NOTE — Telephone Encounter (Signed)
Patient states she has not been able to have echo done yet due to not being able to turn on side. Patient states she goes back to neurosurgeon next week to hopefully have a soft collar placed and will then be able to turn on side and schedule echo. Patient just wanted to let Dr. Domenic Polite know due to still being off warfarin.

## 2017-11-25 DIAGNOSIS — M545 Low back pain: Secondary | ICD-10-CM | POA: Diagnosis not present

## 2017-11-25 DIAGNOSIS — M5416 Radiculopathy, lumbar region: Secondary | ICD-10-CM | POA: Diagnosis not present

## 2017-11-25 DIAGNOSIS — M25511 Pain in right shoulder: Secondary | ICD-10-CM | POA: Diagnosis not present

## 2017-11-25 DIAGNOSIS — Z79891 Long term (current) use of opiate analgesic: Secondary | ICD-10-CM | POA: Diagnosis not present

## 2017-11-26 ENCOUNTER — Ambulatory Visit: Payer: Self-pay | Admitting: Cardiology

## 2017-12-01 DIAGNOSIS — I10 Essential (primary) hypertension: Secondary | ICD-10-CM | POA: Diagnosis not present

## 2017-12-01 DIAGNOSIS — S12191A Other nondisplaced fracture of second cervical vertebra, initial encounter for closed fracture: Secondary | ICD-10-CM | POA: Diagnosis not present

## 2017-12-04 DIAGNOSIS — D509 Iron deficiency anemia, unspecified: Secondary | ICD-10-CM | POA: Diagnosis not present

## 2017-12-05 DIAGNOSIS — H538 Other visual disturbances: Secondary | ICD-10-CM | POA: Diagnosis not present

## 2017-12-05 DIAGNOSIS — H2511 Age-related nuclear cataract, right eye: Secondary | ICD-10-CM | POA: Diagnosis not present

## 2017-12-10 ENCOUNTER — Encounter: Payer: Self-pay | Admitting: "Endocrinology

## 2017-12-11 DIAGNOSIS — D509 Iron deficiency anemia, unspecified: Secondary | ICD-10-CM | POA: Diagnosis not present

## 2017-12-14 ENCOUNTER — Other Ambulatory Visit: Payer: Self-pay | Admitting: Cardiology

## 2017-12-31 ENCOUNTER — Encounter: Payer: Self-pay | Admitting: Gastroenterology

## 2017-12-31 ENCOUNTER — Ambulatory Visit (INDEPENDENT_AMBULATORY_CARE_PROVIDER_SITE_OTHER): Payer: Medicare Other | Admitting: Gastroenterology

## 2017-12-31 VITALS — BP 147/77 | HR 78 | Temp 97.1°F | Ht 63.0 in | Wt 189.2 lb

## 2017-12-31 DIAGNOSIS — I639 Cerebral infarction, unspecified: Secondary | ICD-10-CM | POA: Diagnosis not present

## 2017-12-31 DIAGNOSIS — R198 Other specified symptoms and signs involving the digestive system and abdomen: Secondary | ICD-10-CM

## 2017-12-31 NOTE — Patient Instructions (Signed)
1. Continue Benefiber as you are doing. Let me know if it stops working or if you develop persistent abdominal pain, blood in stools, etc. 2. Keep a check on your weight. If you continue to loose weight, please let me know.  3. Return to the office in six months, but you can call anytime with questions or concerns and we can see you sooner if needed.

## 2017-12-31 NOTE — Assessment & Plan Note (Signed)
From a GI standpoint things have stabilized.  No longer with abdominal pain.  Bowel function improved on Benefiber half teaspoon daily.  She will continue this regimen as long as it is working.  If need to she can go up to a half a teaspoon twice a day.  We will plan to see her back in about 6 months but she can call sooner if she has any issues or questions.  Microcytic anemia noted back in July while in ED.  Anemia was improved compared to May when she was in the hospital after her fall.  Consider rechecking in the near future.

## 2017-12-31 NOTE — Progress Notes (Signed)
Primary Care Physician: Caren Macadam, MD  Primary Gastroenterologist:  Garfield Cornea, MD   Chief Complaint  Patient presents with  . Constipation  . Diarrhea  . Abdominal Pain    HPI: [name] is a 83 y.o. female here for short interval follow-up.  Seen back in August with abdominal pain and rectal bleeding.  Historically has had issues with chronic diarrhea.  Back in May patient fell and suffered a subdural hematoma requiring surgical evacuation and also fracture of C2 and C7.  She was on Coumadin at that time for stroke prophylaxis with atrial fibrillation but this was discontinued.  Since her injury she developed constipation fecal impaction several times.  ED with intermittent rectal bleeding associated with constipation.  Rectal bleeding occurred in the setting of impaction.  With right lower quadrant pain.  CT abdomen pelvis without contrast patient declined IV contrast given history of.  She had noted chronic right UPJ obstruction, no renal stones, nothing to explain rectal bleeding.  Since her fall she also has lost about 20 pounds, weight loss has slowed down however.  After her last visit I advised her to try low-dose Amitiza once daily if she had recurrent constipation.  She tried it one time and then had 3 days of "nonstop" diarrhea.  She started on Benefiber half a teaspoon once per day this has been effective and moving her bowels regularly.  She is very pleased with her bowel function right now.  She is had no further bleeding.  No abdominal pain.  Appetite remains poor, she eats because she has to.  Continues to have a lot of dizziness and has fallen 3 times in the past 2 weeks.  She sees Dr. Merlene Laughter at the pain clinic, suggested that she see him regarding her dizzy spells.  She believes they are related to the torsemide, she is very sensitive and with a half of torsemide tablet she urinates hourly.  No upper GI complaints.   Wt Readings from Last 3 Encounters:    12/31/17 189 lb 3.2 oz (85.8 kg)  11/07/17 192 lb 6.4 oz (87.3 kg)  11/01/17 210 lb (95.3 kg)   Current Outpatient Medications  Medication Sig Dispense Refill  . acetaminophen (TYLENOL) 325 MG tablet Take 650 mg by mouth every 6 (six) hours as needed for pain.     [name] ALPRAZolam (XANAX) 0.5 MG tablet Take 0.5 mg by mouth 2 (two) times daily. *May take one and one-half tablet at bedtime as needed    . Continuous Blood Gluc Sensor (FREESTYLE LIBRE SENSOR SYSTEM) MISC Use one sensor every 10 days. 3 each 2  . Insulin Glargine (LANTUS SOLOSTAR) 100 UNIT/ML Solostar Pen INJECT 20 UNITS INTO THE SKIN DAILY AT BREAKFAST (Patient taking differently: Inject 20 Units into the skin every morning. ) 15 mL 2  . insulin lispro (HUMALOG) 100 UNIT/ML injection Inject 3 Units into the skin. Sliding scale    . losartan (COZAAR) 50 MG tablet Take 100 mg by mouth daily.  3  . metoprolol tartrate (LOPRESSOR) 25 MG tablet TAKE 1 TABLET BY MOUTH TWICE DAILY 60 tablet 2  . nitroGLYCERIN (NITROSTAT) 0.4 MG SL tablet PLACE 1 TABLET UNDER THE TONGUE EVERY 5 MINUTES FOR 3 DOSES AS NEEDED FOR CHEST PAIN. IF YOU HAVE NO RELIEF AFTER THE 3RD DOSE PROCEED TO TH 90 tablet 3  . ondansetron (ZOFRAN-ODT) 4 MG disintegrating tablet Take 4 mg by mouth every 8 (eight) hours as needed. Nausea and Vomiting    .  Oxycodone HCl 10 MG TABS Take 10 mg by mouth every 6 (six) hours.   0  . pantoprazole (PROTONIX) 40 MG tablet TAKE ONE TABLET BY MOUTH TWICE DAILY 60 tablet 5  . simvastatin (ZOCOR) 20 MG tablet Take 1 tablet by mouth daily.    . tizanidine (ZANAFLEX) 2 MG capsule Take 2 mg by mouth 3 (three) times daily as needed for muscle spasms.     [name] torsemide (DEMADEX) 20 MG tablet Take 20 mg by mouth daily.  3  . VICTOZA 18 MG/3ML SOPN Inject 1.8 mg into the skin daily.  2  .        No current facility-administered medications for this visit.     Allergies as of 12/31/2017 - Review Complete 12/31/2017  Allergen Reaction Noted  .  Adhesive [tape] Itching 11/29/2011  . Demerol [meperidine]  02/22/2014  . Iohexol Nausea And Vomiting 03/08/2004  . Meperidine hcl Nausea And Vomiting   . Shellfish allergy Diarrhea and Nausea And Vomiting 08/03/2012  . Penicillins Nausea And Vomiting and Rash     ROS:  General: Negative for fever, chills, fatigue, weakness.  See HPI ENT: Negative for hoarseness, difficulty swallowing , nasal congestion. CV: Negative for chest pain, angina, palpitations, dyspnea on exertion, peripheral edema.  Respiratory: Negative for dyspnea at rest, dyspnea on exertion, cough, sputum, wheezing.  GI: See history of present illness. GU:  Negative for dysuria, hematuria, urinary incontinence, urinary frequency, nocturnal urination.  Endo: See HPI   Physical Examination:   BP (!) 147/77   Pulse 78   Temp (!) 97.1 F (36.2 C) (Oral)   Ht 5\' 3"  (1.6 m)   Wt 189 lb 3.2 oz (85.8 kg)   BMI 33.52 kg/m   General: Somewhat frail-appearing white female in no acute distress.  Eyes: No icterus. Mouth: Oropharyngeal mucosa moist and pink , no lesions erythema or exudate. Lungs: Clear to auscultation bilaterally.  Heart: Regular rate and rhythm, no murmurs rubs or gallops.  Abdomen: Bowel sounds are normal, nontender, nondistended, weak abdominal muscles mid to lower abdomen. Extremities: 1+  lower extremity edema. No clubbing or deformities. Neuro: Alert and oriented x 4   Skin: Warm and dry, no jaundice.   Psych: Alert and cooperative, normal mood and affect.  Labs:  Lab Results  Component Value Date   WBC 8.1 11/01/2017   HGB 10.4 (L) 11/01/2017   HCT 34.9 (L) 11/01/2017   MCV 76.7 (L) 11/01/2017   PLT 286 11/01/2017   Lab Results  Component Value Date   CREATININE 1.18 (H) 11/01/2017   BUN 12 11/01/2017   NA 138 11/01/2017   K 4.3 11/01/2017   CL 102 11/01/2017   CO2 29 11/01/2017   Lab Results  Component Value Date   ALT 7 11/01/2017   AST 13 (L) 11/01/2017   ALKPHOS 67 11/01/2017    BILITOT 0.8 11/01/2017    Imaging Studies: No results found.

## 2017-12-31 NOTE — Progress Notes (Signed)
Please let the patient know that we need to follow-up on her anemia.  Let us have her go to the lab sometime in the next couple of weeks at her convenience to have CBC, iron/ferritin/TIBC drawn.

## 2017-12-31 NOTE — Progress Notes (Signed)
CC'D TO PCP °

## 2018-01-08 ENCOUNTER — Encounter: Payer: Self-pay | Admitting: *Deleted

## 2018-01-08 DIAGNOSIS — E1129 Type 2 diabetes mellitus with other diabetic kidney complication: Secondary | ICD-10-CM | POA: Diagnosis not present

## 2018-01-08 DIAGNOSIS — R809 Proteinuria, unspecified: Secondary | ICD-10-CM | POA: Diagnosis not present

## 2018-01-08 DIAGNOSIS — I129 Hypertensive chronic kidney disease with stage 1 through stage 4 chronic kidney disease, or unspecified chronic kidney disease: Secondary | ICD-10-CM | POA: Diagnosis not present

## 2018-01-08 DIAGNOSIS — D509 Iron deficiency anemia, unspecified: Secondary | ICD-10-CM | POA: Diagnosis not present

## 2018-01-08 DIAGNOSIS — E559 Vitamin D deficiency, unspecified: Secondary | ICD-10-CM | POA: Diagnosis not present

## 2018-01-08 DIAGNOSIS — E1122 Type 2 diabetes mellitus with diabetic chronic kidney disease: Secondary | ICD-10-CM | POA: Diagnosis not present

## 2018-01-08 DIAGNOSIS — N183 Chronic kidney disease, stage 3 (moderate): Secondary | ICD-10-CM | POA: Diagnosis not present

## 2018-01-08 DIAGNOSIS — Z79899 Other long term (current) drug therapy: Secondary | ICD-10-CM | POA: Diagnosis not present

## 2018-01-08 LAB — HEMOGLOBIN A1C: Hgb A1c MFr Bld: 6.5 — AB (ref 4.0–6.0)

## 2018-01-08 NOTE — Progress Notes (Signed)
Cardiology Office Note  Date: 01/09/2018   ID: [name], DOB [birth year], MRN [id number]  PCP: Sandi Mealy, MD  Primary Cardiologist: [name], MD   Chief Complaint  Patient presents with  . Atrial Fibrillation    History of Present Illness: [name] is an 83 y.o. female last seen in June.  She is here today with her son for a follow-up visit.  She is chronically fatigued, is in a wheelchair today.  Soft cervical collar has been removed and by her report Dr. Ronnald Ramp indicated that she has healed reasonably well although will still have propensity to pain in her cervical spine.  She also continues to follow with Dr. Lowanda Foster and has had GI evaluation for intermittent constipation and diarrhea.  She had an episode of blood in her stool as well.  She reports fairly poor appetite and has lost weight related to this.  She also tells me that she has been unsteady on her feet, has fallen 3 or 4 times.  From a cardiac perspective, she does not report any significant palpitations or chest pain.  She is on Lopressor at this time with good heart rate control, otherwise has been taken off other antihypertensive medications.  Systolic is in the 818E today.  Follow up echocardiogram is pending in the next few weeks.  Past Medical History:  Diagnosis Date  . Anxiety   . Asthma   . Chronic back pain   . Colonoscopy causing post-procedural bleeding    Incomplete. by Dr. [name] 06/11/99 due to fixed non-compliant colon precluded exam to 40 cm, she had subsequent colectomy for diverticulitis since that time.   . Diarrhea   . Esophageal reflux   . Essential hypertension   . Fibromyalgia   . Hiatal hernia    Recent EGD/TCS showed small hiatal hernia, normal apperaring tubular esophagus. s/p passage of a 54-french Maloney dilator, s/p biopsy esophageal mucosa, multiple fundal gland type gastric polyps. one large pedunculated polp with oozing, noted s/p clipping and snare  polypectomy. Biopsies of the gastric mucosa taken given her symptoms in her elevated count. normal D1-D3, s/p biospy D2-D3.   [name] Hiatal hernia    all bx unremarkable. on TCS she had left-sided diverticula, evidence of prior segmental resection with anastomosis, multiple colonic polyps s/p multiple snare polypectomies, s/p segmental biopsy and stool sampling. she had multiple tubular adenomas. no microscopic/collagenous colitis. stool culture, c diff, O+P, lactoferrin were negative.   . Mixed hyperlipidemia   . Nephrolithiasis   . OSA (obstructive sleep apnea)   . Permanent atrial fibrillation    Previous failed DCCV  . Type 2 diabetes mellitus ([place])   . Ureteral stent retained    Not retained. ureteral stent removal gross hematuria resolved 10/11. coumadin restarted.   . Ventral hernia     Past Surgical History:  Procedure Laterality Date  . BREAST LUMPECTOMY     benign cyst  . CATARACT EXTRACTION W/PHACO Left 08/17/2012   Procedure: CATARACT EXTRACTION PHACO AND INTRAOCULAR LENS PLACEMENT (IOC);  Surgeon: [name], MD;  Location: [facility];  Service: Ophthalmology;  Laterality: Left;  CDE:18.73  . COLECTOMY     2005. Dr. Geoffry Paradise for diverticulitis  . COLONOSCOPY  02/22/09   normal/left-sided diverticula/multiple colonic polyp, adenomatous  . COLONOSCOPY  12/16/2011   colonic polyps-treated as described above. Status postprior sigmoid resection. adenomatous. next TCS 12/2014  . COLONOSCOPY N/A 12/28/2014   Status post prior segmental resection. Few residual colonic diverticula- status post  segmental biopsy negative random colon biopsies  . ESOPHAGEAL DILATION N/A 12/28/2014   Procedure: ESOPHAGEAL DILATION;  Surgeon: [name], MD;  Location: [facility];  Service: Endoscopy;  Laterality: N/A;  . ESOPHAGOGASTRODUODENOSCOPY  02/22/09   normal s/p dilator;small HH/one large pedunculates polyp  s/p clipping, hyperplastic  . ESOPHAGOGASTRODUODENOSCOPY  12/16/2011   [name]: small hiatal  hernia. Hyperplastic apperating polyps. Status post [name] dilation as described above.  . ESOPHAGOGASTRODUODENOSCOPY N/A 12/28/2014   RMR: Normal-appearing esophagus status post passage of a maloney dilator. Hiatal hernia. abnormal gastic mucosa of uncertain significance status post gastric biopsy with mild chronic gastritis, no H pylori.   . TONSILLECTOMY    . TOTAL ABDOMINAL HYSTERECTOMY      Current Outpatient Medications  Medication Sig Dispense Refill  . acetaminophen (TYLENOL) 325 MG tablet Take 650 mg by mouth every 6 (six) hours as needed for pain.     [name] ALPRAZolam (XANAX) 0.5 MG tablet Take 0.5 mg by mouth 2 (two) times daily. *May take one and one-half tablet at bedtime as needed    . Continuous Blood Gluc Sensor (FREESTYLE LIBRE SENSOR SYSTEM) MISC Use one sensor every 10 days. 3 each 2  . Insulin Glargine (LANTUS SOLOSTAR) 100 UNIT/ML Solostar Pen INJECT 20 UNITS INTO THE SKIN DAILY AT BREAKFAST (Patient taking differently: Inject 20 Units into the skin every morning. ) 15 mL 2  . insulin lispro (HUMALOG) 100 UNIT/ML injection Inject 3 Units into the skin. Sliding scale    . metoprolol tartrate (LOPRESSOR) 25 MG tablet TAKE 1 TABLET BY MOUTH TWICE DAILY 60 tablet 2  . nitroGLYCERIN (NITROSTAT) 0.4 MG SL tablet PLACE 1 TABLET UNDER THE TONGUE EVERY 5 MINUTES FOR 3 DOSES AS NEEDED FOR CHEST PAIN. IF YOU HAVE NO RELIEF AFTER THE 3RD DOSE PROCEED TO TH 90 tablet 3  . ondansetron (ZOFRAN-ODT) 4 MG disintegrating tablet Take 4 mg by mouth every 8 (eight) hours as needed. Nausea and Vomiting    . Oxycodone HCl 10 MG TABS Take 10 mg by mouth every 6 (six) hours.   0  . pantoprazole (PROTONIX) 40 MG tablet TAKE ONE TABLET BY MOUTH TWICE DAILY 60 tablet 5  . simvastatin (ZOCOR) 20 MG tablet Take 1 tablet by mouth daily.    . tizanidine (ZANAFLEX) 2 MG capsule Take 2 mg by mouth 3 (three) times daily as needed for muscle spasms.     [name] VICTOZA 18 MG/3ML SOPN Inject 1.8 mg into the skin daily.   2   No current facility-administered medications for this visit.    Allergies:  Adhesive [tape]; Demerol [meperidine]; Iohexol; Meperidine hcl; Shellfish allergy; and Penicillins   Social History: The patient  reports that she has never smoked. She has never used smokeless tobacco. She reports that she does not drink alcohol or use drugs.   ROS:  Please see the history of present illness. Otherwise, complete review of systems is positive for none.  All other systems are reviewed and negative.   Physical Exam: VS:  BP (!) 148/84   Pulse (!) 58   Ht 5\' 3"  (1.6 m)   Wt 188 lb 12.8 oz (85.6 kg)   SpO2 100% Comment: on room air  BMI 33.44 kg/m , BMI Body mass index is 33.44 kg/m.  Wt Readings from Last 3 Encounters:  01/09/18 188 lb 12.8 oz (85.6 kg)  12/31/17 189 lb 3.2 oz (85.8 kg)  11/07/17 192 lb 6.4 oz (87.3 kg)    General: Elderly woman  in wheelchair. HEENT: Conjunctiva and lids normal, oropharynx clear. Neck: Supple, no elevated JVP or carotid bruits, no thyromegaly. Lungs: Clear to auscultation, nonlabored breathing at rest. Cardiac: Irregularly irregular, no S3 or significant systolic murmur. Abdomen: Soft, nontender, bowel sounds present. Extremities: Mild lower leg edema, distal pulses 2+. Skin: Warm and dry. Musculoskeletal: No kyphosis. Neuropsychiatric: Alert and oriented x3, affect grossly appropriate.  ECG: I personally reviewed the tracing from 10/01/2017 which showed rate controlled atrial fibrillation with poor R wave progression, low voltage, and nonspecific ST changes.  Recent Labwork: 09/04/2017: TSH 2.47 11/01/2017: ALT 7; AST 13; BUN 12; Creatinine, Ser 1.18; Hemoglobin 10.4; Platelets 286; Potassium 4.3; Sodium 138     Component Value Date/Time   CHOL 123 09/04/2017 0851   TRIG 116 09/04/2017 0851   HDL 33 (L) 09/04/2017 0851   CHOLHDL 3.7 09/04/2017 0851   VLDL 35 07/11/2009 2342   LDLCALC 70 09/04/2017 0851   Assessment and Plan:  1.  Permanent  atrial fibrillation with CHADSVASC score of 5.  Heart rate is well controlled and she is asymptomatic on Lopressor.  She remains off anticoagulation following previous subdural hematoma and cervical fractures.  Frankly, I am concerned about resuming anticoagulation in light of her current clinical state including frequent falls, mention of blood in her stools intermittently and iron deficiency anemia.  We have discussed this issue today, and she is also hesitant to resume anticoagulation.  2.  History of bilateral leg swelling.  Norvasc has been discontinued.  Follow-up echocardiogram pending.  3.  Essential hypertension, now on Lopressor alone.  4.  Mixed hyperlipidemia, continues on Zocor.  Current medicines were reviewed with the patient today.  Disposition: Follow-up in 3 months.  Signed, [name], MD, [facility] 01/09/2018 12:03 PM    [facility] [address], [place], [place] [address] Phone: [phone]; Fax: [phone]

## 2018-01-09 ENCOUNTER — Ambulatory Visit (INDEPENDENT_AMBULATORY_CARE_PROVIDER_SITE_OTHER): Payer: Medicare Other | Admitting: Cardiology

## 2018-01-09 ENCOUNTER — Ambulatory Visit: Payer: Self-pay | Admitting: "Endocrinology

## 2018-01-09 ENCOUNTER — Encounter: Payer: Self-pay | Admitting: Cardiology

## 2018-01-09 VITALS — BP 148/84 | HR 58 | Ht 63.0 in | Wt 188.8 lb

## 2018-01-09 DIAGNOSIS — I10 Essential (primary) hypertension: Secondary | ICD-10-CM | POA: Diagnosis not present

## 2018-01-09 DIAGNOSIS — I4821 Permanent atrial fibrillation: Secondary | ICD-10-CM | POA: Diagnosis not present

## 2018-01-09 DIAGNOSIS — R296 Repeated falls: Secondary | ICD-10-CM

## 2018-01-09 DIAGNOSIS — I639 Cerebral infarction, unspecified: Secondary | ICD-10-CM | POA: Diagnosis not present

## 2018-01-09 DIAGNOSIS — Z23 Encounter for immunization: Secondary | ICD-10-CM | POA: Diagnosis not present

## 2018-01-09 DIAGNOSIS — E782 Mixed hyperlipidemia: Secondary | ICD-10-CM

## 2018-01-09 NOTE — Patient Instructions (Addendum)
Medication Instructions:   Your physician recommends that you continue on your current medications as directed. Please refer to the Current Medication list given to you today.  Labwork:  NONE  Testing/Procedures:  NONE-have already scheduled echo done.  Follow-Up:  Your physician recommends that you schedule a follow-up appointment in: 3 months.  Any Other Special Instructions Will Be Listed Below (If Applicable).  If you need a refill on your cardiac medications before your next appointment, please call your pharmacy.

## 2018-01-14 DIAGNOSIS — Z794 Long term (current) use of insulin: Secondary | ICD-10-CM | POA: Diagnosis not present

## 2018-01-14 DIAGNOSIS — E103293 Type 1 diabetes mellitus with mild nonproliferative diabetic retinopathy without macular edema, bilateral: Secondary | ICD-10-CM | POA: Diagnosis not present

## 2018-01-14 DIAGNOSIS — E1065 Type 1 diabetes mellitus with hyperglycemia: Secondary | ICD-10-CM | POA: Diagnosis not present

## 2018-01-14 DIAGNOSIS — H35371 Puckering of macula, right eye: Secondary | ICD-10-CM | POA: Diagnosis not present

## 2018-01-15 ENCOUNTER — Other Ambulatory Visit: Payer: Self-pay | Admitting: Gastroenterology

## 2018-01-20 DIAGNOSIS — I10 Essential (primary) hypertension: Secondary | ICD-10-CM | POA: Diagnosis not present

## 2018-01-20 DIAGNOSIS — D508 Other iron deficiency anemias: Secondary | ICD-10-CM | POA: Diagnosis not present

## 2018-01-20 DIAGNOSIS — N183 Chronic kidney disease, stage 3 (moderate): Secondary | ICD-10-CM | POA: Diagnosis not present

## 2018-01-20 DIAGNOSIS — E559 Vitamin D deficiency, unspecified: Secondary | ICD-10-CM | POA: Diagnosis not present

## 2018-01-20 DIAGNOSIS — N111 Chronic obstructive pyelonephritis: Secondary | ICD-10-CM | POA: Diagnosis not present

## 2018-01-20 DIAGNOSIS — E1121 Type 2 diabetes mellitus with diabetic nephropathy: Secondary | ICD-10-CM | POA: Diagnosis not present

## 2018-01-22 ENCOUNTER — Ambulatory Visit: Payer: Self-pay | Admitting: "Endocrinology

## 2018-01-22 DIAGNOSIS — M5416 Radiculopathy, lumbar region: Secondary | ICD-10-CM | POA: Diagnosis not present

## 2018-01-22 DIAGNOSIS — M25511 Pain in right shoulder: Secondary | ICD-10-CM | POA: Diagnosis not present

## 2018-01-22 DIAGNOSIS — Z79891 Long term (current) use of opiate analgesic: Secondary | ICD-10-CM | POA: Diagnosis not present

## 2018-01-22 DIAGNOSIS — M545 Low back pain: Secondary | ICD-10-CM | POA: Diagnosis not present

## 2018-01-28 ENCOUNTER — Ambulatory Visit (INDEPENDENT_AMBULATORY_CARE_PROVIDER_SITE_OTHER): Payer: Medicare Other

## 2018-01-28 ENCOUNTER — Other Ambulatory Visit: Payer: Self-pay

## 2018-01-28 ENCOUNTER — Telehealth: Payer: Self-pay | Admitting: *Deleted

## 2018-01-28 DIAGNOSIS — I4821 Permanent atrial fibrillation: Secondary | ICD-10-CM | POA: Diagnosis not present

## 2018-01-28 NOTE — Telephone Encounter (Signed)
Pt aware - routed to pcp  

## 2018-01-28 NOTE — Telephone Encounter (Signed)
-----   Message from [name], MD sent at 01/28/2018  1:54 PM EDT ----- Results reviewed.  LVEF is normal at 60 to 65%.  No major valvular abnormalities and pulmonary artery systolic pressure is normal range.  Continue with current medical therapy. A copy of this test should be forwarded to Sandi Mealy, MD.

## 2018-01-29 ENCOUNTER — Ambulatory Visit (INDEPENDENT_AMBULATORY_CARE_PROVIDER_SITE_OTHER): Payer: Medicare Other | Admitting: "Endocrinology

## 2018-01-29 ENCOUNTER — Encounter: Payer: Self-pay | Admitting: "Endocrinology

## 2018-01-29 VITALS — BP 143/72 | HR 81 | Ht 63.0 in

## 2018-01-29 DIAGNOSIS — I639 Cerebral infarction, unspecified: Secondary | ICD-10-CM

## 2018-01-29 DIAGNOSIS — I10 Essential (primary) hypertension: Secondary | ICD-10-CM | POA: Diagnosis not present

## 2018-01-29 DIAGNOSIS — N183 Chronic kidney disease, stage 3 (moderate): Secondary | ICD-10-CM

## 2018-01-29 DIAGNOSIS — E782 Mixed hyperlipidemia: Secondary | ICD-10-CM

## 2018-01-29 DIAGNOSIS — E1122 Type 2 diabetes mellitus with diabetic chronic kidney disease: Secondary | ICD-10-CM
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
## 2018-01-29 NOTE — Patient Instructions (Signed)

## 2018-02-03 DIAGNOSIS — S065X9A Traumatic subdural hemorrhage with loss of consciousness of unspecified duration, initial encounter: Secondary | ICD-10-CM | POA: Diagnosis not present

## 2018-02-03 DIAGNOSIS — I10 Essential (primary) hypertension: Secondary | ICD-10-CM | POA: Diagnosis not present

## 2018-02-03 DIAGNOSIS — Z7689 Persons encountering health services in other specified circumstances: Secondary | ICD-10-CM | POA: Diagnosis not present

## 2018-02-03 DIAGNOSIS — Z23 Encounter for immunization: Secondary | ICD-10-CM | POA: Diagnosis not present

## 2018-02-03 DIAGNOSIS — E1122 Type 2 diabetes mellitus with diabetic chronic kidney disease: Secondary | ICD-10-CM | POA: Diagnosis not present

## 2018-02-03 DIAGNOSIS — L602 Onychogryphosis: Secondary | ICD-10-CM | POA: Diagnosis not present

## 2018-02-03 DIAGNOSIS — N183 Chronic kidney disease, stage 3 (moderate): Secondary | ICD-10-CM | POA: Diagnosis not present

## 2018-02-03 DIAGNOSIS — I4821 Permanent atrial fibrillation: Secondary | ICD-10-CM | POA: Diagnosis not present

## 2018-02-05 ENCOUNTER — Telehealth: Payer: Self-pay | Admitting: Cardiology

## 2018-02-05 NOTE — Telephone Encounter (Signed)
Patient was seen by PCP on 02-03-18. States that her BP was elevated. Was told to contact her cardiology doctor.

## 2018-02-06 NOTE — Telephone Encounter (Signed)
Spoke with patient who says she was advised to contact her cardiologist r/e her elevated BP at her PCP off on yesterday . Per care everywhere note, patient's BP was 186/101. Denies chest pain or sob. Patient said she does have problems with dizziness but this has been ongoing since she hit her head a while back. Patient denies increased dizziness. Patient reports that she does have a digital BP monitor at home and checked her BP yesterday after PCP visit and again today but doesn't remember what the readings were and the readings were not recorded. Patient advised to check her BP daily for 2 weeks, using same arm, same cuff and same time and record readings. Advised to contact our office with her readings. Advised patient to contact our office next week if her BP readings are elevated. Advised patient if she develops worsening symptoms of dizziness or develop headache, blurred vision, to go to the ED for an evaluation. Verbalized understanding of plan.

## 2018-02-19 DIAGNOSIS — M545 Low back pain: Secondary | ICD-10-CM | POA: Diagnosis not present

## 2018-02-19 DIAGNOSIS — Z79891 Long term (current) use of opiate analgesic: Secondary | ICD-10-CM | POA: Diagnosis not present

## 2018-02-19 DIAGNOSIS — R2689 Other abnormalities of gait and mobility: Secondary | ICD-10-CM | POA: Diagnosis not present

## 2018-02-19 DIAGNOSIS — M25511 Pain in right shoulder: Secondary | ICD-10-CM | POA: Diagnosis not present

## 2018-02-19 DIAGNOSIS — M5416 Radiculopathy, lumbar region: Secondary | ICD-10-CM | POA: Diagnosis not present

## 2018-02-26 DIAGNOSIS — L304 Erythema intertrigo: Secondary | ICD-10-CM | POA: Diagnosis not present

## 2018-02-26 DIAGNOSIS — J069 Acute upper respiratory infection, unspecified: Secondary | ICD-10-CM | POA: Diagnosis not present

## 2018-03-03 ENCOUNTER — Other Ambulatory Visit: Payer: Self-pay

## 2018-03-03 DIAGNOSIS — D649 Anemia, unspecified: Secondary | ICD-10-CM

## 2018-03-03 NOTE — Progress Notes (Signed)
Mailing letter to pt. Called a couple times and left VM. Mailing orders to pt as well.

## 2018-03-07 DIAGNOSIS — F419 Anxiety disorder, unspecified: Secondary | ICD-10-CM | POA: Diagnosis not present

## 2018-03-07 DIAGNOSIS — Z91041 Radiographic dye allergy status: Secondary | ICD-10-CM | POA: Diagnosis not present

## 2018-03-07 DIAGNOSIS — Z7901 Long term (current) use of anticoagulants: Secondary | ICD-10-CM | POA: Diagnosis not present

## 2018-03-07 DIAGNOSIS — Z90710 Acquired absence of both cervix and uterus: Secondary | ICD-10-CM | POA: Diagnosis not present

## 2018-03-07 DIAGNOSIS — R05 Cough: Secondary | ICD-10-CM | POA: Diagnosis not present

## 2018-03-07 DIAGNOSIS — R0600 Dyspnea, unspecified: Secondary | ICD-10-CM | POA: Diagnosis not present

## 2018-03-07 DIAGNOSIS — I4891 Unspecified atrial fibrillation: Secondary | ICD-10-CM | POA: Diagnosis not present

## 2018-03-07 DIAGNOSIS — Z886 Allergy status to analgesic agent status: Secondary | ICD-10-CM | POA: Diagnosis not present

## 2018-03-07 DIAGNOSIS — I129 Hypertensive chronic kidney disease with stage 1 through stage 4 chronic kidney disease, or unspecified chronic kidney disease: Secondary | ICD-10-CM | POA: Diagnosis not present

## 2018-03-07 DIAGNOSIS — K219 Gastro-esophageal reflux disease without esophagitis: Secondary | ICD-10-CM | POA: Diagnosis not present

## 2018-03-07 DIAGNOSIS — E1022 Type 1 diabetes mellitus with diabetic chronic kidney disease: Secondary | ICD-10-CM | POA: Diagnosis not present

## 2018-03-07 DIAGNOSIS — Z888 Allergy status to other drugs, medicaments and biological substances status: Secondary | ICD-10-CM | POA: Diagnosis not present

## 2018-03-07 DIAGNOSIS — N189 Chronic kidney disease, unspecified: Secondary | ICD-10-CM | POA: Diagnosis not present

## 2018-03-07 DIAGNOSIS — I700 Atherosclerosis of aorta: Secondary | ICD-10-CM | POA: Diagnosis not present

## 2018-03-07 DIAGNOSIS — M797 Fibromyalgia: Secondary | ICD-10-CM | POA: Diagnosis not present

## 2018-03-07 DIAGNOSIS — Z880 Allergy status to penicillin: Secondary | ICD-10-CM | POA: Diagnosis not present

## 2018-03-07 DIAGNOSIS — Z79899 Other long term (current) drug therapy: Secondary | ICD-10-CM | POA: Diagnosis not present

## 2018-03-07 DIAGNOSIS — Z885 Allergy status to narcotic agent status: Secondary | ICD-10-CM | POA: Diagnosis not present

## 2018-03-07 DIAGNOSIS — E7800 Pure hypercholesterolemia, unspecified: Secondary | ICD-10-CM | POA: Diagnosis not present

## 2018-03-07 DIAGNOSIS — J209 Acute bronchitis, unspecified: Secondary | ICD-10-CM | POA: Diagnosis not present

## 2018-03-10 ENCOUNTER — Telehealth: Payer: Self-pay | Admitting: "Endocrinology

## 2018-03-10 NOTE — Telephone Encounter (Signed)
Pt states she has had high BG readings. After taking Prednisone    Date Before breakfast Before lunch Before supper Bedtime  12/1 155 230 195 230  12/2 105  184 168  12/3 97 178            Pt taking: Lantus 20 units qhs, Humalog 3 units tidac  , Victoza 1.8mg  qd  I explained to pt that these BG readings were not the typical after taking prednisone and notified her that I would send to Dr Dorris Fetch but he may not change dosage.

## 2018-03-10 NOTE — Telephone Encounter (Signed)
Pt went to er and they gave her meds that are causing her sugar to go really high.  Can you give her some type of adjustment.

## 2018-03-10 NOTE — Telephone Encounter (Signed)
Correct, she does not need adjustment based on these readings. Call back if >200 x 3 .

## 2018-03-11 NOTE — Telephone Encounter (Signed)
Pt.notified

## 2018-03-12 ENCOUNTER — Telehealth: Payer: Self-pay | Admitting: Internal Medicine

## 2018-03-12 NOTE — Telephone Encounter (Signed)
Pt said she was having a hard time getting our number to go through for her to call AM, but wanted to let AM know she was going to try to have her labs done tomorrow depending if she can get a ride.

## 2018-03-13 NOTE — Telephone Encounter (Signed)
Noted  

## 2018-03-16 ENCOUNTER — Encounter: Payer: Self-pay | Admitting: Gastroenterology

## 2018-03-16 DIAGNOSIS — D649 Anemia, unspecified: Secondary | ICD-10-CM | POA: Diagnosis not present

## 2018-03-24 DIAGNOSIS — B373 Candidiasis of vulva and vagina: Secondary | ICD-10-CM | POA: Diagnosis not present

## 2018-03-24 DIAGNOSIS — J069 Acute upper respiratory infection, unspecified: Secondary | ICD-10-CM | POA: Diagnosis not present

## 2018-03-25 ENCOUNTER — Other Ambulatory Visit: Payer: Self-pay | Admitting: Cardiology

## 2018-03-26 ENCOUNTER — Telehealth: Payer: Self-pay | Admitting: Internal Medicine

## 2018-03-26 NOTE — Telephone Encounter (Signed)
Labs dated 03/16/2018 White blood cell count 8800, hemoglobin 16.5, hematocrit 50, MCV 86.7, platelets 247,000.  Iron 61, TIBC 209 slightly low, iron saturations 30%, ferritin 128.  Please let patient know her labs look good.  No evidence of anemia.  Iron is good.  keep office visit in March as planned.

## 2018-03-26 NOTE — Telephone Encounter (Signed)
Pt calling to see if her lab results are back. Please call [phone]

## 2018-03-26 NOTE — Telephone Encounter (Signed)
Pt notified of results and will f/u at next ov.

## 2018-03-26 NOTE — Telephone Encounter (Signed)
Spoke with pt. She went to the hospital in [place]. Call the lab, they are faxing results over. Results will be placed in LSL box for review.

## 2018-04-10 ENCOUNTER — Encounter: Payer: Self-pay | Admitting: *Deleted

## 2018-04-12 NOTE — Progress Notes (Signed)
Cardiology Office Note  Date: 04/13/2018   ID: [name], DOB [birth year], MRN [id number]  PCP: Sandi Mealy, MD  Primary Cardiologist: [name], MD   Chief Complaint  Patient presents with  . Atrial Fibrillation    History of Present Illness: [name] is an 84 y.o. female last seen in October 2019.  She is here for a routine follow-up visit, unaccompanied today.  She states that she has been feeling about the same in terms of energy, still has relatively frequent falls.  She lives at home with 2 sons.  She has been under a lot of stress she says.  She does not report any chest pain or palpitations, has not had frank syncope.  Follow-up echocardiogram in October 2019 showed LVEF 60-65%, full report outlined below.  She has had fluctuations in blood pressure, did not bring in a home blood pressure record to review today however.  We went over her medications which are outlined below. She was previously taken off Norvasc due to leg swelling.  Today we discussed continuing Lopressor for heart rate control.  We have also discussed the issue of anticoagulation with CHADSVASC score of 5, however with history of frequent falls and subdural hematoma as well as cervical fractures, we do not plan to resume anticoagulation.  Past Medical History:  Diagnosis Date  . Anxiety   . Asthma   . Chronic back pain   . Colonoscopy causing post-procedural bleeding    Incomplete. by Dr. [name] 06/11/99 due to fixed non-compliant colon precluded exam to 40 cm, she had subsequent colectomy for diverticulitis since that time.   . Diarrhea   . Esophageal reflux   . Essential hypertension   . Fibromyalgia   . Hiatal hernia    Recent EGD/TCS showed small hiatal hernia, normal apperaring tubular esophagus. s/p passage of a 54-french Maloney dilator, s/p biopsy esophageal mucosa, multiple fundal gland type gastric polyps. one large pedunculated polp with oozing, noted s/p clipping and  snare polypectomy. Biopsies of the gastric mucosa taken given her symptoms in her elevated count. normal D1-D3, s/p biospy D2-D3.   [name] Hiatal hernia    all bx unremarkable. on TCS she had left-sided diverticula, evidence of prior segmental resection with anastomosis, multiple colonic polyps s/p multiple snare polypectomies, s/p segmental biopsy and stool sampling. she had multiple tubular adenomas. no microscopic/collagenous colitis. stool culture, c diff, O+P, lactoferrin were negative.   . Mixed hyperlipidemia   . Nephrolithiasis   . OSA (obstructive sleep apnea)   . Permanent atrial fibrillation    Previous failed DCCV  . Type 2 diabetes mellitus ([place])   . Ureteral stent retained    Not retained. ureteral stent removal gross hematuria resolved 10/11. coumadin restarted.   . Ventral hernia     Past Surgical History:  Procedure Laterality Date  . BREAST LUMPECTOMY     benign cyst  . CATARACT EXTRACTION W/PHACO Left 08/17/2012   Procedure: CATARACT EXTRACTION PHACO AND INTRAOCULAR LENS PLACEMENT (IOC);  Surgeon: [name], MD;  Location: [facility];  Service: Ophthalmology;  Laterality: Left;  CDE:18.73  . COLECTOMY     2005. Dr. Geoffry Paradise for diverticulitis  . COLONOSCOPY  02/22/09   normal/left-sided diverticula/multiple colonic polyp, adenomatous  . COLONOSCOPY  12/16/2011   colonic polyps-treated as described above. Status postprior sigmoid resection. adenomatous. next TCS 12/2014  . COLONOSCOPY N/A 12/28/2014   Status post prior segmental resection. Few residual colonic diverticula- status post segmental biopsy negative random colon  biopsies  . ESOPHAGEAL DILATION N/A 12/28/2014   Procedure: ESOPHAGEAL DILATION;  Surgeon: [name], MD;  Location: [facility];  Service: Endoscopy;  Laterality: N/A;  . ESOPHAGOGASTRODUODENOSCOPY  02/22/09   normal s/p dilator;small HH/one large pedunculates polyp  s/p clipping, hyperplastic  . ESOPHAGOGASTRODUODENOSCOPY  12/16/2011   [name]: small  hiatal hernia. Hyperplastic apperating polyps. Status post [name] dilation as described above.  . ESOPHAGOGASTRODUODENOSCOPY N/A 12/28/2014   RMR: Normal-appearing esophagus status post passage of a maloney dilator. Hiatal hernia. abnormal gastic mucosa of uncertain significance status post gastric biopsy with mild chronic gastritis, no H pylori.   . TONSILLECTOMY    . TOTAL ABDOMINAL HYSTERECTOMY      Current Outpatient Medications  Medication Sig Dispense Refill  . acetaminophen (TYLENOL) 325 MG tablet Take 650 mg by mouth every 6 (six) hours as needed for pain.     . Continuous Blood Gluc Sensor (FREESTYLE LIBRE SENSOR SYSTEM) MISC Use one sensor every 10 days. 3 each 2  . Insulin Glargine (LANTUS SOLOSTAR) 100 UNIT/ML Solostar Pen INJECT 20 UNITS INTO THE SKIN DAILY AT BREAKFAST (Patient taking differently: Inject 20 Units into the skin every morning. ) 15 mL 2  . metoprolol tartrate (LOPRESSOR) 25 MG tablet TAKE 1 TABLET BY MOUTH TWICE DAILY 60 tablet 6  . nitroGLYCERIN (NITROSTAT) 0.4 MG SL tablet PLACE 1 TABLET UNDER THE TONGUE EVERY 5 MINUTES FOR 3 DOSES AS NEEDED FOR CHEST PAIN. IF YOU HAVE NO RELIEF AFTER THE 3RD DOSE PROCEED TO TH 90 tablet 3  . ondansetron (ZOFRAN-ODT) 4 MG disintegrating tablet Take 4 mg by mouth every 8 (eight) hours as needed. Nausea and Vomiting    . pantoprazole (PROTONIX) 40 MG tablet TAKE ONE TABLET BY MOUTH TWICE DAILY 60 tablet 5  . simvastatin (ZOCOR) 20 MG tablet Take 1 tablet by mouth daily.    [name] VICTOZA 18 MG/3ML SOPN Inject 1.8 mg into the skin daily.  2   No current facility-administered medications for this visit.    Allergies:  Adhesive [tape]; Demerol [meperidine]; Iohexol; Meperidine hcl; Shellfish allergy; and Penicillins   Social History: The patient  reports that she has never smoked. She has never used smokeless tobacco. She reports that she does not drink alcohol or use drugs.   ROS:  Please see the history of present illness. Otherwise,  complete review of systems is positive for none.  All other systems are reviewed and negative.   Physical Exam: VS:  BP 130/88   Pulse 76   Ht 5\' 2"  (1.575 m)   Wt 175 lb 9.6 oz (79.7 kg)   SpO2 98%   BMI 32.12 kg/m , BMI Body mass index is 32.12 kg/m.  Wt Readings from Last 3 Encounters:  04/13/18 175 lb 9.6 oz (79.7 kg)  01/09/18 188 lb 12.8 oz (85.6 kg)  12/31/17 189 lb 3.2 oz (85.8 kg)    General: Elderly woman, appears comfortable at rest. HEENT: Conjunctiva and lids normal, oropharynx clear. Neck: Supple, no elevated JVP or carotid bruits, no thyromegaly. Lungs: Clear to auscultation, nonlabored breathing at rest. Cardiac: Irregularly irregular, no S3 or significant systolic murmur. Abdomen: Soft, nontender, bowel sounds present. Extremities: Trace ankle edema, distal pulses 2+. Skin: Warm and dry. Musculoskeletal: No kyphosis. Neuropsychiatric: Alert and oriented x3, affect grossly appropriate.  ECG: I personally reviewed the tracing from 10/01/2017 which showed rate controlled atrial fibrillation with poor R wave progression, low voltage, and nonspecific ST changes.  Recent Labwork: 09/04/2017: TSH 2.47 11/01/2017:  ALT 7; AST 13; BUN 12; Creatinine, Ser 1.18; Hemoglobin 10.4; Platelets 286; Potassium 4.3; Sodium 138     Component Value Date/Time   CHOL 123 09/04/2017 0851   TRIG 116 09/04/2017 0851   HDL 33 (L) 09/04/2017 0851   CHOLHDL 3.7 09/04/2017 0851   VLDL 35 07/11/2009 2342   LDLCALC 70 09/04/2017 0851    Other Studies Reviewed Today:   Echocardiogram 01/28/2018:  Study Conclusions  - Left ventricle: The cavity size was normal. Wall thickness was   increased in a pattern of mild LVH. Systolic function was normal.   The estimated ejection fraction was in the range of 60% to 65%.   Wall motion was normal; there were no regional wall motion   abnormalities. The study was not technically sufficient to allow   evaluation of LV diastolic dysfunction due to  atrial   fibrillation. - Aortic valve: Mildly calcified annulus. Trileaflet; moderately   calcified leaflets. - Mitral valve: Mildly calcified annulus. There was trivial   regurgitation. - Left atrium: The atrium was mildly dilated. - Right atrium: Central venous pressure (est): 8 mm Hg. - Atrial septum: No defect or patent foramen ovale was identified. - Tricuspid valve: There was mild regurgitation. - Pulmonary arteries: PA peak pressure: 30 mm Hg (S). - Pericardium, extracardiac: A prominent pericardial fat pad was   present. A small pericardial effusion was identified posterior to   the heart.   Assessment and Plan:  1.  Permanent atrial fibrillation.  Heart rate is adequately controlled on Lopressor.  As noted above we are not pursuing resumption of anticoagulation with history of frequent falls and subdural hematoma with cervical fractures.  CHADSVASC score is 5.  2.  Leg swelling improved off Norvasc.  Follow-up echocardiogram shows normal LVEF at 60 to 65%.  3.  Essential hypertension, continue with current regimen, systolic in the 734L today.  Low-dose ARB could be added next if needed.  Keep follow-up with PCP.  4.  Mixed hyperlipidemia on Zocor.  Last LDL 70.  Current medicines were reviewed with the patient today.  Disposition: Follow-up in 6 months.  Signed, [name], MD, [facility] 04/13/2018 9:44 AM    [place] at [place], [place], [place] [address] Phone: [phone]; Fax: [phone]

## 2018-04-13 ENCOUNTER — Ambulatory Visit (INDEPENDENT_AMBULATORY_CARE_PROVIDER_SITE_OTHER): Payer: Medicare Other | Admitting: Cardiology

## 2018-04-13 ENCOUNTER — Encounter: Payer: Self-pay | Admitting: Cardiology

## 2018-04-13 VITALS — BP 130/88 | HR 76 | Ht 62.0 in | Wt 175.6 lb

## 2018-04-13 DIAGNOSIS — E782 Mixed hyperlipidemia: Secondary | ICD-10-CM | POA: Diagnosis not present

## 2018-04-13 DIAGNOSIS — I10 Essential (primary) hypertension: Secondary | ICD-10-CM

## 2018-04-13 DIAGNOSIS — I4821 Permanent atrial fibrillation: Secondary | ICD-10-CM

## 2018-04-13 DIAGNOSIS — R296 Repeated falls: Secondary | ICD-10-CM | POA: Diagnosis not present

## 2018-04-13 NOTE — Patient Instructions (Signed)

## 2018-04-27 ENCOUNTER — Other Ambulatory Visit: Payer: Self-pay

## 2018-04-27 MED ORDER — PEN NEEDLES 31G X 6 MM MISC: 1.0000 | Freq: Every day | 3 refills | Status: DC

## 2018-05-04 ENCOUNTER — Other Ambulatory Visit: Payer: Self-pay | Admitting: "Endocrinology

## 2018-05-06 DIAGNOSIS — L603 Nail dystrophy: Secondary | ICD-10-CM | POA: Diagnosis not present

## 2018-05-06 DIAGNOSIS — E1140 Type 2 diabetes mellitus with diabetic neuropathy, unspecified: Secondary | ICD-10-CM | POA: Diagnosis not present

## 2018-05-06 DIAGNOSIS — M79671 Pain in right foot: Secondary | ICD-10-CM | POA: Diagnosis not present

## 2018-05-06 DIAGNOSIS — M79675 Pain in left toe(s): Secondary | ICD-10-CM | POA: Diagnosis not present

## 2018-05-06 DIAGNOSIS — M79672 Pain in left foot: Secondary | ICD-10-CM | POA: Diagnosis not present

## 2018-05-06 DIAGNOSIS — M79674 Pain in right toe(s): Secondary | ICD-10-CM | POA: Diagnosis not present

## 2018-05-13 ENCOUNTER — Other Ambulatory Visit: Payer: Self-pay | Admitting: Nurse Practitioner

## 2018-06-16 DIAGNOSIS — E559 Vitamin D deficiency, unspecified: Secondary | ICD-10-CM | POA: Diagnosis not present

## 2018-06-16 DIAGNOSIS — N183 Chronic kidney disease, stage 3 (moderate): Secondary | ICD-10-CM | POA: Diagnosis not present

## 2018-06-16 DIAGNOSIS — I129 Hypertensive chronic kidney disease with stage 1 through stage 4 chronic kidney disease, or unspecified chronic kidney disease: Secondary | ICD-10-CM | POA: Diagnosis not present

## 2018-06-16 DIAGNOSIS — Z79899 Other long term (current) drug therapy: Secondary | ICD-10-CM | POA: Diagnosis not present

## 2018-06-16 DIAGNOSIS — R809 Proteinuria, unspecified: Secondary | ICD-10-CM | POA: Diagnosis not present

## 2018-06-16 DIAGNOSIS — D509 Iron deficiency anemia, unspecified: Secondary | ICD-10-CM | POA: Diagnosis not present

## 2018-06-21 DIAGNOSIS — Z885 Allergy status to narcotic agent status: Secondary | ICD-10-CM | POA: Diagnosis not present

## 2018-06-21 DIAGNOSIS — R531 Weakness: Secondary | ICD-10-CM | POA: Diagnosis not present

## 2018-06-21 DIAGNOSIS — I160 Hypertensive urgency: Secondary | ICD-10-CM | POA: Diagnosis not present

## 2018-06-21 DIAGNOSIS — R0902 Hypoxemia: Secondary | ICD-10-CM | POA: Diagnosis not present

## 2018-06-21 DIAGNOSIS — Z91013 Allergy to seafood: Secondary | ICD-10-CM | POA: Diagnosis not present

## 2018-06-21 DIAGNOSIS — G458 Other transient cerebral ischemic attacks and related syndromes: Secondary | ICD-10-CM | POA: Diagnosis not present

## 2018-06-21 DIAGNOSIS — I10 Essential (primary) hypertension: Secondary | ICD-10-CM | POA: Diagnosis not present

## 2018-06-21 DIAGNOSIS — R079 Chest pain, unspecified: Secondary | ICD-10-CM | POA: Diagnosis not present

## 2018-06-21 DIAGNOSIS — R202 Paresthesia of skin: Secondary | ICD-10-CM | POA: Diagnosis not present

## 2018-06-21 DIAGNOSIS — Z91041 Radiographic dye allergy status: Secondary | ICD-10-CM | POA: Diagnosis not present

## 2018-06-21 DIAGNOSIS — R0789 Other chest pain: Secondary | ICD-10-CM | POA: Diagnosis not present

## 2018-06-22 DIAGNOSIS — Z9181 History of falling: Secondary | ICD-10-CM | POA: Diagnosis not present

## 2018-06-22 DIAGNOSIS — Z888 Allergy status to other drugs, medicaments and biological substances status: Secondary | ICD-10-CM | POA: Diagnosis not present

## 2018-06-22 DIAGNOSIS — R079 Chest pain, unspecified: Secondary | ICD-10-CM | POA: Diagnosis not present

## 2018-06-22 DIAGNOSIS — I129 Hypertensive chronic kidney disease with stage 1 through stage 4 chronic kidney disease, or unspecified chronic kidney disease: Secondary | ICD-10-CM | POA: Diagnosis not present

## 2018-06-22 DIAGNOSIS — Z880 Allergy status to penicillin: Secondary | ICD-10-CM | POA: Diagnosis not present

## 2018-06-22 DIAGNOSIS — I480 Paroxysmal atrial fibrillation: Secondary | ICD-10-CM | POA: Diagnosis not present

## 2018-06-22 DIAGNOSIS — N189 Chronic kidney disease, unspecified: Secondary | ICD-10-CM | POA: Diagnosis present

## 2018-06-22 DIAGNOSIS — E1122 Type 2 diabetes mellitus with diabetic chronic kidney disease: Secondary | ICD-10-CM | POA: Diagnosis present

## 2018-06-22 DIAGNOSIS — I639 Cerebral infarction, unspecified: Secondary | ICD-10-CM | POA: Diagnosis not present

## 2018-06-22 DIAGNOSIS — R531 Weakness: Secondary | ICD-10-CM | POA: Diagnosis present

## 2018-06-22 DIAGNOSIS — Z91041 Radiographic dye allergy status: Secondary | ICD-10-CM | POA: Diagnosis not present

## 2018-06-22 DIAGNOSIS — Z87898 Personal history of other specified conditions: Secondary | ICD-10-CM | POA: Diagnosis not present

## 2018-06-22 DIAGNOSIS — E785 Hyperlipidemia, unspecified: Secondary | ICD-10-CM | POA: Diagnosis present

## 2018-06-22 DIAGNOSIS — I160 Hypertensive urgency: Secondary | ICD-10-CM | POA: Diagnosis not present

## 2018-06-22 DIAGNOSIS — Z91013 Allergy to seafood: Secondary | ICD-10-CM | POA: Diagnosis not present

## 2018-06-22 DIAGNOSIS — I4891 Unspecified atrial fibrillation: Secondary | ICD-10-CM | POA: Diagnosis present

## 2018-06-22 DIAGNOSIS — R200 Anesthesia of skin: Secondary | ICD-10-CM | POA: Diagnosis not present

## 2018-06-22 DIAGNOSIS — R29810 Facial weakness: Secondary | ICD-10-CM | POA: Diagnosis not present

## 2018-06-23 DIAGNOSIS — I160 Hypertensive urgency: Secondary | ICD-10-CM | POA: Diagnosis not present

## 2018-06-23 DIAGNOSIS — I4891 Unspecified atrial fibrillation: Secondary | ICD-10-CM | POA: Diagnosis not present

## 2018-06-23 DIAGNOSIS — R531 Weakness: Secondary | ICD-10-CM | POA: Diagnosis not present

## 2018-06-23 DIAGNOSIS — R29810 Facial weakness: Secondary | ICD-10-CM | POA: Diagnosis not present

## 2018-06-23 DIAGNOSIS — I480 Paroxysmal atrial fibrillation: Secondary | ICD-10-CM | POA: Diagnosis not present

## 2018-06-23 DIAGNOSIS — R200 Anesthesia of skin: Secondary | ICD-10-CM | POA: Diagnosis not present

## 2018-06-23 DIAGNOSIS — I129 Hypertensive chronic kidney disease with stage 1 through stage 4 chronic kidney disease, or unspecified chronic kidney disease: Secondary | ICD-10-CM | POA: Diagnosis not present

## 2018-06-24 ENCOUNTER — Encounter: Payer: Self-pay | Admitting: *Deleted

## 2018-06-25 DIAGNOSIS — Z9181 History of falling: Secondary | ICD-10-CM | POA: Diagnosis not present

## 2018-06-25 DIAGNOSIS — E1122 Type 2 diabetes mellitus with diabetic chronic kidney disease: Secondary | ICD-10-CM | POA: Diagnosis not present

## 2018-06-25 DIAGNOSIS — E785 Hyperlipidemia, unspecified: Secondary | ICD-10-CM | POA: Diagnosis not present

## 2018-06-25 DIAGNOSIS — N189 Chronic kidney disease, unspecified: Secondary | ICD-10-CM | POA: Diagnosis not present

## 2018-06-25 DIAGNOSIS — I4891 Unspecified atrial fibrillation: Secondary | ICD-10-CM | POA: Diagnosis not present

## 2018-06-25 DIAGNOSIS — Z794 Long term (current) use of insulin: Secondary | ICD-10-CM | POA: Diagnosis not present

## 2018-06-25 DIAGNOSIS — I129 Hypertensive chronic kidney disease with stage 1 through stage 4 chronic kidney disease, or unspecified chronic kidney disease: Secondary | ICD-10-CM | POA: Diagnosis not present

## 2018-06-26 DIAGNOSIS — I4891 Unspecified atrial fibrillation: Secondary | ICD-10-CM | POA: Diagnosis not present

## 2018-06-26 DIAGNOSIS — N189 Chronic kidney disease, unspecified: Secondary | ICD-10-CM | POA: Diagnosis not present

## 2018-06-26 DIAGNOSIS — E1122 Type 2 diabetes mellitus with diabetic chronic kidney disease: Secondary | ICD-10-CM | POA: Diagnosis not present

## 2018-06-26 DIAGNOSIS — Z794 Long term (current) use of insulin: Secondary | ICD-10-CM | POA: Diagnosis not present

## 2018-06-26 DIAGNOSIS — I129 Hypertensive chronic kidney disease with stage 1 through stage 4 chronic kidney disease, or unspecified chronic kidney disease: Secondary | ICD-10-CM | POA: Diagnosis not present

## 2018-06-26 DIAGNOSIS — E785 Hyperlipidemia, unspecified: Secondary | ICD-10-CM | POA: Diagnosis not present

## 2018-06-29 ENCOUNTER — Telehealth: Payer: Self-pay | Admitting: Cardiology

## 2018-06-29 NOTE — Telephone Encounter (Signed)
   Primary cardiologist: Dr. [name]  Patient contacted in light of the escalating COVID-19 pandemic in an effort to determine whether she may be a candidate for deferring her follow-up visit.[name]  History and symptoms reviewed.  She was hospitalized in [place] approximately 1 week ago with reportedly a "mini stroke," I do not have records for review at this time.  Her last visit was in January. History includes permanent atrial fibrillation with CHADSVASC score of 5.  She had been on Coumadin in the past although anticoagulation was not resumed after a previous fall with subdural hematoma and cervical fractures.  In light of the recent presumed TIA, anticoagulation will need to be readdressed, may be a reasonable candidate for DOAC.    Plan: I encouraged her to keep her follow-up visit on Wednesday so that we can clarify plan.  She was in agreement.  In terms of general COVID-19 screening, she denies any recent fevers or travel, no cough or shortness of breath, no obvious exposures to others with documented or pending COVID-19 testing.  [name], M.D., F.A.C.C.

## 2018-06-30 ENCOUNTER — Ambulatory Visit: Payer: Medicare Other | Admitting: Gastroenterology

## 2018-06-30 DIAGNOSIS — N189 Chronic kidney disease, unspecified: Secondary | ICD-10-CM | POA: Diagnosis not present

## 2018-06-30 DIAGNOSIS — I4891 Unspecified atrial fibrillation: Secondary | ICD-10-CM | POA: Diagnosis not present

## 2018-06-30 DIAGNOSIS — E1122 Type 2 diabetes mellitus with diabetic chronic kidney disease: Secondary | ICD-10-CM | POA: Diagnosis not present

## 2018-06-30 DIAGNOSIS — Z794 Long term (current) use of insulin: Secondary | ICD-10-CM | POA: Diagnosis not present

## 2018-06-30 DIAGNOSIS — E785 Hyperlipidemia, unspecified: Secondary | ICD-10-CM | POA: Diagnosis not present

## 2018-06-30 DIAGNOSIS — I129 Hypertensive chronic kidney disease with stage 1 through stage 4 chronic kidney disease, or unspecified chronic kidney disease: Secondary | ICD-10-CM | POA: Diagnosis not present

## 2018-07-01 ENCOUNTER — Encounter: Payer: Self-pay | Admitting: Cardiology

## 2018-07-01 ENCOUNTER — Ambulatory Visit (INDEPENDENT_AMBULATORY_CARE_PROVIDER_SITE_OTHER): Payer: Medicare Other | Admitting: Cardiology

## 2018-07-01 ENCOUNTER — Other Ambulatory Visit: Payer: Self-pay

## 2018-07-01 VITALS — BP 142/80 | HR 77 | Temp 97.7°F | Ht 62.0 in | Wt 172.0 lb

## 2018-07-01 DIAGNOSIS — Z79899 Other long term (current) drug therapy: Secondary | ICD-10-CM | POA: Diagnosis not present

## 2018-07-01 DIAGNOSIS — G459 Transient cerebral ischemic attack, unspecified: Secondary | ICD-10-CM | POA: Diagnosis not present

## 2018-07-01 DIAGNOSIS — I6523 Occlusion and stenosis of bilateral carotid arteries: Secondary | ICD-10-CM | POA: Diagnosis not present

## 2018-07-01 DIAGNOSIS — I10 Essential (primary) hypertension: Secondary | ICD-10-CM

## 2018-07-01 DIAGNOSIS — I4821 Permanent atrial fibrillation: Secondary | ICD-10-CM

## 2018-07-01 DIAGNOSIS — E782 Mixed hyperlipidemia: Secondary | ICD-10-CM | POA: Diagnosis not present

## 2018-07-01 MED ORDER — APIXABAN 5 MG PO TABS: 5.0000 mg | ORAL_TABLET | Freq: Two times a day (BID) | ORAL | 6 refills | Status: DC

## 2018-07-01 NOTE — Progress Notes (Signed)
Cardiology Office Note  Date: 07/01/2018   ID: Karolyn, Messing 02-Jul-1934, MRN [id number]  PCP: Sandi Mealy, MD  Primary Cardiologist: [name], MD   Chief Complaint  Patient presents with  . Hospitalization Follow-up    History of Present Illness: KHAMARI YOUSUF is an 84 y.o. female last seen in January.  She presents for a post hospital visit.  She was recently admitted to [facility] in [place] with left arm numbness and facial asymmetry, not found to have evidence of acute stroke and diagnosed with suspected TIA.  She was not treated with tPA, both brain CT and MRI did not show evidence of acute stroke or intracranial hemorrhage.  There was evidence of prior right parietal craniotomy with chronic maxillary sinus disease.  She also had less than 50% bilateral ICA stenoses with echocardiogram showing hyperdynamic LVEF. Medical therapy was continued including statin and Norvasc.  She was not started on any anticoagulation.  She has a history of permanent atrial fibrillation with heart rate control on Lopressor, but not anticoagulated recently.  She had previously been on Coumadin although this was discontinued following a significant fall with resulting cervical fractures and subdural hematoma (May 2019).  Her most recent CHADSVASC score is up to 7 following recent TIA.  She states that she has been back at home, her son looks in on her.  She also has had PT and OT.  She uses a walker and a cane, no recent falls but has had some vertigo.  She does not report any palpitations or chest pain.  Today we discussed her risk of stroke which is very high at this time.  We went over the risks and benefits of treatment with Eliquis (would not resume Coumadin however), I went over her recent blood work.  She denies any bleeding problems.  Past Medical History:  Diagnosis Date  . Anxiety   . Asthma   . Chronic back pain   . Colonoscopy causing post-procedural bleeding     Incomplete. by Dr. [name] 06/11/99 due to fixed non-compliant colon precluded exam to 40 cm, she had subsequent colectomy for diverticulitis since that time.   . Diarrhea   . Esophageal reflux   . Essential hypertension   . Fibromyalgia   . Hiatal hernia    Recent EGD/TCS showed small hiatal hernia, normal apperaring tubular esophagus. s/p passage of a 54-french Maloney dilator, s/p biopsy esophageal mucosa, multiple fundal gland type gastric polyps. one large pedunculated polp with oozing, noted s/p clipping and snare polypectomy. Biopsies of the gastric mucosa taken given her symptoms in her elevated count. normal D1-D3, s/p biospy D2-D3.   [name] Hiatal hernia    all bx unremarkable. on TCS she had left-sided diverticula, evidence of prior segmental resection with anastomosis, multiple colonic polyps s/p multiple snare polypectomies, s/p segmental biopsy and stool sampling. she had multiple tubular adenomas. no microscopic/collagenous colitis. stool culture, c diff, O+P, lactoferrin were negative.   . Mixed hyperlipidemia   . Nephrolithiasis   . OSA (obstructive sleep apnea)   . Permanent atrial fibrillation    Previous failed DCCV  . Type 2 diabetes mellitus ([place])   . Ureteral stent retained    Not retained. ureteral stent removal gross hematuria resolved 10/11. coumadin restarted.   . Ventral hernia     Past Surgical History:  Procedure Laterality Date  . BREAST LUMPECTOMY     benign cyst  . CATARACT EXTRACTION W/PHACO Left 08/17/2012   Procedure: CATARACT  EXTRACTION PHACO AND INTRAOCULAR LENS PLACEMENT (IOC);  Surgeon: [name], MD;  Location: [facility];  Service: Ophthalmology;  Laterality: Left;  CDE:18.73  . COLECTOMY     2005. Dr. Geoffry Paradise for diverticulitis  . COLONOSCOPY  02/22/09   normal/left-sided diverticula/multiple colonic polyp, adenomatous  . COLONOSCOPY  12/16/2011   colonic polyps-treated as described above. Status postprior sigmoid resection. adenomatous. next TCS  12/2014  . COLONOSCOPY N/A 12/28/2014   Status post prior segmental resection. Few residual colonic diverticula- status post segmental biopsy negative random colon biopsies  . ESOPHAGEAL DILATION N/A 12/28/2014   Procedure: ESOPHAGEAL DILATION;  Surgeon: [name], MD;  Location: [facility];  Service: Endoscopy;  Laterality: N/A;  . ESOPHAGOGASTRODUODENOSCOPY  02/22/09   normal s/p dilator;small HH/one large pedunculates polyp  s/p clipping, hyperplastic  . ESOPHAGOGASTRODUODENOSCOPY  12/16/2011   [name]: small hiatal hernia. Hyperplastic apperating polyps. Status post [name] dilation as described above.  . ESOPHAGOGASTRODUODENOSCOPY N/A 12/28/2014   RMR: Normal-appearing esophagus status post passage of a maloney dilator. Hiatal hernia. abnormal gastic mucosa of uncertain significance status post gastric biopsy with mild chronic gastritis, no H pylori.   . TONSILLECTOMY    . TOTAL ABDOMINAL HYSTERECTOMY      Current Outpatient Medications  Medication Sig Dispense Refill  . acetaminophen (TYLENOL) 325 MG tablet Take 650 mg by mouth every 6 (six) hours as needed for pain.     [name] amLODipine (NORVASC) 5 MG tablet Take 2.5 mg by mouth daily.    . Continuous Blood Gluc Sensor (FREESTYLE LIBRE SENSOR SYSTEM) MISC Use one sensor every 10 days. 3 each 2  . Insulin Glargine (LANTUS SOLOSTAR) 100 UNIT/ML Solostar Pen INJECT 30 UNITS INTO THE SKIN DAILY AT 10 P.M. 15 mL 2  . Insulin Pen Needle (PEN NEEDLES) 31G X 6 MM MISC 1 each by Does not apply route at bedtime. 100 each 3  . metoprolol tartrate (LOPRESSOR) 25 MG tablet TAKE 1 TABLET BY MOUTH TWICE DAILY 60 tablet 6  . nitroGLYCERIN (NITROSTAT) 0.4 MG SL tablet PLACE 1 TABLET UNDER THE TONGUE EVERY 5 MINUTES FOR 3 DOSES AS NEEDED FOR CHEST PAIN. IF YOU HAVE NO RELIEF AFTER THE 3RD DOSE PROCEED TO TH 90 tablet 3  . ondansetron (ZOFRAN-ODT) 4 MG disintegrating tablet Take 4 mg by mouth every 8 (eight) hours as needed. Nausea and Vomiting    .  pantoprazole (PROTONIX) 40 MG tablet TAKE 1 TABLET BY MOUTH TWICE DAILY 60 tablet 5  . simvastatin (ZOCOR) 20 MG tablet Take 1 tablet by mouth daily.    [name] VICTOZA 18 MG/3ML SOPN Inject 1.8 mg into the skin daily.  2  . apixaban (ELIQUIS) 5 MG TABS tablet Take 1 tablet (5 mg total) by mouth 2 (two) times daily. 60 tablet 6   No current facility-administered medications for this visit.    Allergies:  Adhesive [tape]; Demerol [meperidine]; Iohexol; Meperidine hcl; Shellfish allergy; and Penicillins   Social History: The patient  reports that she has never smoked. She has never used smokeless tobacco. She reports that she does not drink alcohol or use drugs.   Family History: The patient's family history includes Colon cancer in her maternal grandfather; Coronary artery disease in an other family member; Depression in her son; Diabetes in her father and mother; Heart attack in her father and mother; Heart disease in her sister; Hernia in her son; Hypertension in her father and mother; Mental retardation in her brother; Pancreatitis in her son.   ROS:  Please see the history of present illness. Otherwise, complete review of systems is positive for hearing loss.  All other systems are reviewed and negative.   Physical Exam: VS:  BP (!) 142/80   Pulse 77   Temp 97.7 F (36.5 C)   Ht 5\' 2"  (1.575 m)   Wt 172 lb (78 kg)   SpO2 97%   BMI 31.46 kg/m , BMI Body mass index is 31.46 kg/m.  Wt Readings from Last 3 Encounters:  07/01/18 172 lb (78 kg)  04/13/18 175 lb 9.6 oz (79.7 kg)  01/09/18 188 lb 12.8 oz (85.6 kg)    Patient presented with negative COVID-19 screen on office intake.  She is wearing a mask.  Gloves were utilized for her examination including glove placed on stethoscope.  General: Elderly woman wearing a mask, uses a cane today to ambulate.  She appears comfortable at rest. HEENT: Conjunctiva and lids normal, oropharynx clear with moist mucosa. Neck: Supple, no elevated JVP or  carotid bruits, no thyromegaly. Lungs: Clear to auscultation, nonlabored breathing at rest. Cardiac: Irregularly irregular, no S3 or significant systolic murmur. Abdomen: Soft, nontender, bowel sounds present. Extremities: Trace ankle edema, distal pulses 2+. Skin: Warm and dry. Musculoskeletal: No kyphosis. Neuropsychiatric: Alert and oriented x3, affect grossly appropriate.  ECG: I personally reviewed the tracing from 06/21/2018 from [facility] which showed rate controlled atrial fibrillation with decreased R wave progression and nonspecific T wave changes.  Recent Labwork: 09/04/2017: TSH 2.47 11/01/2017: ALT 7; AST 13; BUN 12; Creatinine, Ser 1.18; Hemoglobin 10.4; Platelets 286; Potassium 4.3; Sodium 138     Component Value Date/Time   CHOL 123 09/04/2017 0851   TRIG 116 09/04/2017 0851   HDL 33 (L) 09/04/2017 0851   CHOLHDL 3.7 09/04/2017 0851   VLDL 35 07/11/2009 2342   LDLCALC 70 09/04/2017 0851  March 2020: Hemoglobin 13.4, platelets 189, INR 1.0, BUN 17, creatinine 1.12, potassium 3.9, cholesterol 139, triglycerides 130, HDL 44, LDL 69, TSH 3.3, troponin I negative, BNP 161  Other Studies Reviewed Today:  Echocardiogram 06/22/2018 ([facility]): Mildly dilated left ventricle with LVEF of 80%, normal right ventricular contraction, mild left atrial enlargement, mild mitral regurgitation, trace aortic regurgitation, mild tricuspid regurgitation, trivial pericardial effusion.  Carotid Dopplers 06/22/2018 ([facility]): Bilateral ICA stenoses of less than 50% with antegrade vertebral flow.  Assessment and Plan:  1.  Recent TIA as outlined above.  Records reviewed including imaging study results.  She reports no residual deficits as described and is tolerating home PT/OT.  She uses a walker at home, also a cane intermittently.  2.  Permanent atrial fibrillation, CHADSVASC score is now 7.  We did not resume Coumadin after she had a significant fall back in May 2019 with  resulting cervical fractures and subdural hematoma.  She has a high risk of recurrent stroke in the absence of anticoagulation, and we discussed the risks and benefits of initiating Eliquis 5 mg twice daily (would not resume Coumadin however).  Heart rate is otherwise controlled and she is asymptomatic on Lopressor.  I reviewed her recent blood work.  She does not report any spontaneous bleeding problems.  3.  Essential hypertension, continue on Norvasc and Lopressor.  4.  Mixed hyperlipidemia, she continues on Zocor.  Recent LDL 69.  5.  Nonobstructive ICA stenosis by recent Dopplers.  Continue statin therapy.  Current medicines were reviewed with the patient today.   Orders Placed This Encounter  Procedures  . Basic metabolic panel  . CBC  Disposition: Anticipate follow-up in 3 months with CBC and BMET.  Signed, [name], MD, [facility] 07/01/2018 1:34 PM    [place] at [place], [place], [place] [address] Phone: [phone]; Fax: [phone]

## 2018-07-01 NOTE — Patient Instructions (Addendum)
Medication Instructions:   Your physician has recommended you make the following change in your medication:   Start eliquis 5 mg by mouth twice daily-take voucher to the pharmacy and call the eliquis number on your voucher.  Please do not take aspirin  Continue all other medications the same  Labwork:  Your physician recommends that you return for lab work in: 3 months just before your next visit to check your BMET & CBC.-lab orders given during visit  Testing/Procedures:  NONE  Follow-Up:  Your physician recommends that you schedule a follow-up appointment in: 3 months.   Any Other Special Instructions Will Be Listed Below (If Applicable).  If you need a refill on your cardiac medications before your next appointment, please call your pharmacy.

## 2018-07-02 DIAGNOSIS — I4891 Unspecified atrial fibrillation: Secondary | ICD-10-CM | POA: Diagnosis not present

## 2018-07-02 DIAGNOSIS — Z794 Long term (current) use of insulin: Secondary | ICD-10-CM | POA: Diagnosis not present

## 2018-07-02 DIAGNOSIS — I129 Hypertensive chronic kidney disease with stage 1 through stage 4 chronic kidney disease, or unspecified chronic kidney disease: Secondary | ICD-10-CM | POA: Diagnosis not present

## 2018-07-02 DIAGNOSIS — N189 Chronic kidney disease, unspecified: Secondary | ICD-10-CM | POA: Diagnosis not present

## 2018-07-02 DIAGNOSIS — E785 Hyperlipidemia, unspecified: Secondary | ICD-10-CM | POA: Diagnosis not present

## 2018-07-02 DIAGNOSIS — E1122 Type 2 diabetes mellitus with diabetic chronic kidney disease: Secondary | ICD-10-CM | POA: Diagnosis not present

## 2018-07-04 DIAGNOSIS — I129 Hypertensive chronic kidney disease with stage 1 through stage 4 chronic kidney disease, or unspecified chronic kidney disease: Secondary | ICD-10-CM | POA: Diagnosis not present

## 2018-07-04 DIAGNOSIS — I4891 Unspecified atrial fibrillation: Secondary | ICD-10-CM | POA: Diagnosis not present

## 2018-07-04 DIAGNOSIS — Z794 Long term (current) use of insulin: Secondary | ICD-10-CM | POA: Diagnosis not present

## 2018-07-04 DIAGNOSIS — E785 Hyperlipidemia, unspecified: Secondary | ICD-10-CM | POA: Diagnosis not present

## 2018-07-04 DIAGNOSIS — E1122 Type 2 diabetes mellitus with diabetic chronic kidney disease: Secondary | ICD-10-CM | POA: Diagnosis not present

## 2018-07-04 DIAGNOSIS — N189 Chronic kidney disease, unspecified: Secondary | ICD-10-CM | POA: Diagnosis not present

## 2018-07-06 DIAGNOSIS — Z794 Long term (current) use of insulin: Secondary | ICD-10-CM | POA: Diagnosis not present

## 2018-07-06 DIAGNOSIS — E1122 Type 2 diabetes mellitus with diabetic chronic kidney disease: Secondary | ICD-10-CM | POA: Diagnosis not present

## 2018-07-06 DIAGNOSIS — E785 Hyperlipidemia, unspecified: Secondary | ICD-10-CM | POA: Diagnosis not present

## 2018-07-06 DIAGNOSIS — N189 Chronic kidney disease, unspecified: Secondary | ICD-10-CM | POA: Diagnosis not present

## 2018-07-06 DIAGNOSIS — I129 Hypertensive chronic kidney disease with stage 1 through stage 4 chronic kidney disease, or unspecified chronic kidney disease: Secondary | ICD-10-CM | POA: Diagnosis not present

## 2018-07-06 DIAGNOSIS — I4891 Unspecified atrial fibrillation: Secondary | ICD-10-CM | POA: Diagnosis not present

## 2018-07-13 ENCOUNTER — Other Ambulatory Visit: Payer: Self-pay | Admitting: "Endocrinology

## 2018-07-15 DIAGNOSIS — J302 Other seasonal allergic rhinitis: Secondary | ICD-10-CM | POA: Diagnosis not present

## 2018-07-15 DIAGNOSIS — N183 Chronic kidney disease, stage 3 (moderate): Secondary | ICD-10-CM | POA: Diagnosis not present

## 2018-07-15 DIAGNOSIS — Z7189 Other specified counseling: Secondary | ICD-10-CM | POA: Diagnosis not present

## 2018-07-15 DIAGNOSIS — E1122 Type 2 diabetes mellitus with diabetic chronic kidney disease: Secondary | ICD-10-CM | POA: Diagnosis not present

## 2018-07-15 DIAGNOSIS — I129 Hypertensive chronic kidney disease with stage 1 through stage 4 chronic kidney disease, or unspecified chronic kidney disease: Secondary | ICD-10-CM | POA: Diagnosis not present

## 2018-07-23 ENCOUNTER — Telehealth: Payer: Self-pay | Admitting: "Endocrinology

## 2018-07-23 NOTE — Telephone Encounter (Signed)
All required information faxed to [facility] for freestyle libre sensor refills.

## 2018-07-25 DIAGNOSIS — Z9181 History of falling: Secondary | ICD-10-CM | POA: Diagnosis not present

## 2018-07-25 DIAGNOSIS — Z794 Long term (current) use of insulin: Secondary | ICD-10-CM | POA: Diagnosis not present

## 2018-07-25 DIAGNOSIS — E785 Hyperlipidemia, unspecified: Secondary | ICD-10-CM | POA: Diagnosis not present

## 2018-07-25 DIAGNOSIS — N189 Chronic kidney disease, unspecified: Secondary | ICD-10-CM | POA: Diagnosis not present

## 2018-07-25 DIAGNOSIS — I129 Hypertensive chronic kidney disease with stage 1 through stage 4 chronic kidney disease, or unspecified chronic kidney disease: Secondary | ICD-10-CM | POA: Diagnosis not present

## 2018-07-25 DIAGNOSIS — I4891 Unspecified atrial fibrillation: Secondary | ICD-10-CM | POA: Diagnosis not present

## 2018-07-25 DIAGNOSIS — E1122 Type 2 diabetes mellitus with diabetic chronic kidney disease: Secondary | ICD-10-CM | POA: Diagnosis not present

## 2018-07-28 DIAGNOSIS — E1122 Type 2 diabetes mellitus with diabetic chronic kidney disease: Secondary | ICD-10-CM | POA: Diagnosis not present

## 2018-07-28 DIAGNOSIS — N183 Chronic kidney disease, stage 3 (moderate): Secondary | ICD-10-CM | POA: Diagnosis not present

## 2018-07-28 LAB — HEMOGLOBIN A1C: Hemoglobin A1C: 7.3

## 2018-07-28 LAB — BASIC METABOLIC PANEL
BUN: 24 — AB (ref 4–21)
Creatinine: 1.7 — AB (ref 0.5–1.1)

## 2018-07-31 ENCOUNTER — Ambulatory Visit: Payer: Self-pay | Admitting: "Endocrinology

## 2018-07-31 DIAGNOSIS — N189 Chronic kidney disease, unspecified: Secondary | ICD-10-CM | POA: Diagnosis not present

## 2018-07-31 DIAGNOSIS — E1122 Type 2 diabetes mellitus with diabetic chronic kidney disease: Secondary | ICD-10-CM | POA: Diagnosis not present

## 2018-07-31 DIAGNOSIS — I129 Hypertensive chronic kidney disease with stage 1 through stage 4 chronic kidney disease, or unspecified chronic kidney disease: Secondary | ICD-10-CM | POA: Diagnosis not present

## 2018-07-31 DIAGNOSIS — Z794 Long term (current) use of insulin: Secondary | ICD-10-CM | POA: Diagnosis not present

## 2018-07-31 DIAGNOSIS — E785 Hyperlipidemia, unspecified: Secondary | ICD-10-CM | POA: Diagnosis not present

## 2018-07-31 DIAGNOSIS — I4891 Unspecified atrial fibrillation: Secondary | ICD-10-CM | POA: Diagnosis not present

## 2018-08-05 DIAGNOSIS — M79671 Pain in right foot: Secondary | ICD-10-CM | POA: Diagnosis not present

## 2018-08-05 DIAGNOSIS — I739 Peripheral vascular disease, unspecified: Secondary | ICD-10-CM | POA: Diagnosis not present

## 2018-08-05 DIAGNOSIS — E1140 Type 2 diabetes mellitus with diabetic neuropathy, unspecified: Secondary | ICD-10-CM | POA: Diagnosis not present

## 2018-08-05 DIAGNOSIS — M79672 Pain in left foot: Secondary | ICD-10-CM | POA: Diagnosis not present

## 2018-08-11 ENCOUNTER — Other Ambulatory Visit: Payer: Self-pay

## 2018-08-11 ENCOUNTER — Encounter: Payer: Self-pay | Admitting: "Endocrinology

## 2018-08-11 ENCOUNTER — Ambulatory Visit (INDEPENDENT_AMBULATORY_CARE_PROVIDER_SITE_OTHER): Payer: Medicare Other | Admitting: "Endocrinology

## 2018-08-11 VITALS — BP 149/78 | HR 81 | Temp 98.0°F | Wt 176.0 lb

## 2018-08-11 DIAGNOSIS — I6523 Occlusion and stenosis of bilateral carotid arteries: Secondary | ICD-10-CM | POA: Diagnosis not present

## 2018-08-11 DIAGNOSIS — E782 Mixed hyperlipidemia: Secondary | ICD-10-CM

## 2018-08-11 DIAGNOSIS — I10 Essential (primary) hypertension: Secondary | ICD-10-CM

## 2018-08-11 DIAGNOSIS — E1122 Type 2 diabetes mellitus with diabetic chronic kidney disease: Secondary | ICD-10-CM | POA: Diagnosis not present

## 2018-08-11 DIAGNOSIS — N183 Chronic kidney disease, stage 3 unspecified: Secondary | ICD-10-CM

## 2018-08-11 MED ORDER — INSULIN GLARGINE 100 UNIT/ML SOLOSTAR PEN: PEN_INJECTOR | SUBCUTANEOUS | 2 refills | Status: DC

## 2018-08-11 MED ORDER — LIRAGLUTIDE 18 MG/3ML ~~LOC~~ SOPN: 1.2000 mg | PEN_INJECTOR | Freq: Every day | SUBCUTANEOUS | 0 refills | Status: DC
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
## 2018-08-11 NOTE — Patient Instructions (Signed)

## 2018-08-18 ENCOUNTER — Other Ambulatory Visit: Payer: Self-pay | Admitting: "Endocrinology

## 2018-08-18 ENCOUNTER — Other Ambulatory Visit: Payer: Self-pay | Admitting: Cardiology

## 2018-09-02 ENCOUNTER — Telehealth: Payer: Self-pay | Admitting: "Endocrinology

## 2018-09-02 NOTE — Telephone Encounter (Signed)
All required information has been sent to [facility] for pts freestyle Libre 14 day sensors.

## 2018-09-02 NOTE — Progress Notes (Signed)
Patient is injecting insulin 4 times per day. She is testing BG 4 x daily.

## 2018-09-15 DIAGNOSIS — E1122 Type 2 diabetes mellitus with diabetic chronic kidney disease: Secondary | ICD-10-CM | POA: Diagnosis not present

## 2018-09-15 DIAGNOSIS — R600 Localized edema: Secondary | ICD-10-CM | POA: Diagnosis not present

## 2018-09-15 DIAGNOSIS — M25552 Pain in left hip: Secondary | ICD-10-CM | POA: Diagnosis not present

## 2018-09-15 DIAGNOSIS — N183 Chronic kidney disease, stage 3 (moderate): Secondary | ICD-10-CM | POA: Diagnosis not present

## 2018-09-15 DIAGNOSIS — W19XXXD Unspecified fall, subsequent encounter: Secondary | ICD-10-CM | POA: Diagnosis not present

## 2018-09-15 DIAGNOSIS — I4821 Permanent atrial fibrillation: Secondary | ICD-10-CM | POA: Diagnosis not present

## 2018-09-24 ENCOUNTER — Ambulatory Visit (INDEPENDENT_AMBULATORY_CARE_PROVIDER_SITE_OTHER): Payer: Medicare Other | Admitting: Gastroenterology

## 2018-09-24 ENCOUNTER — Other Ambulatory Visit: Payer: Self-pay

## 2018-09-24 ENCOUNTER — Encounter: Payer: Self-pay | Admitting: Gastroenterology

## 2018-09-24 VITALS — BP 124/79 | HR 65 | Temp 97.1°F | Ht 62.0 in | Wt 181.6 lb

## 2018-09-24 DIAGNOSIS — K219 Gastro-esophageal reflux disease without esophagitis: Secondary | ICD-10-CM

## 2018-09-24 DIAGNOSIS — I6523 Occlusion and stenosis of bilateral carotid arteries: Secondary | ICD-10-CM

## 2018-09-24 DIAGNOSIS — K5900 Constipation, unspecified: Secondary | ICD-10-CM | POA: Diagnosis not present

## 2018-09-24 NOTE — Assessment & Plan Note (Signed)
Doing well on pantoprazole 40 mg twice daily.  Continue antireflux measures.  Return to the office in 6 months.

## 2018-09-24 NOTE — Patient Instructions (Signed)
1. Continue pantoprazole twice daily before a meal. 2. Continue Benefiber as needed.  3. Return to the office in six months or call sooner if needed.

## 2018-09-24 NOTE — Progress Notes (Signed)
Primary Care Physician: Sandi Mealy, MD  Primary Gastroenterologist:  Garfield Cornea, MD   Chief Complaint  Patient presents with  . Constipation    f/u, doing ok    HPI: [name] is a 84 y.o. female here for follow-up.  She was last seen in September.  Historically has had issues with chronic diarrhea.  She suffered a significant fall in May 7893, complicated by a subdural hematoma requiring surgical evacuation and fracture of C2 and C7.  She had been on Coumadin for stroke prophylaxis with A. fib at that time but was discontinued.  She developed constipation after this injury.  We tried her on low-dose Amitiza but she developed nonstop diarrhea for 3 days after 1 dose.  She has been able to get regulated on Benefiber.  Since we last saw her she had another stroke back in March.  She was off anticoagulation at that time. She has since been started back on Eliquis.  She has gained full function.  She is gained about 10 pounds in the past few months.  Bowel movements are regular.  Uses Benefiber when needed.  Reflux well controlled on pantoprazole twice daily.  No dysphagia, abdominal pain, melena, rectal bleeding.    Current Outpatient Medications  Medication Sig Dispense Refill  . acetaminophen (TYLENOL) 325 MG tablet Take 650 mg by mouth every 6 (six) hours as needed for pain.     [name] amLODipine (NORVASC) 5 MG tablet TAKE 1 TABLET BY MOUTH EVERY DAY 30 tablet 3  . apixaban (ELIQUIS) 5 MG TABS tablet Take 1 tablet (5 mg total) by mouth 2 (two) times daily. 60 tablet 6  . Continuous Blood Gluc Sensor (FREESTYLE LIBRE SENSOR SYSTEM) MISC Use one sensor every 10 days. 3 each 2  . Insulin Glargine (LANTUS SOLOSTAR) 100 UNIT/ML Solostar Pen INJECT 24 UNITS INTO THE SKIN DAILY AT 10 P.M. 15 mL 2  . Insulin Pen Needle (PEN NEEDLES) 31G X 6 MM MISC 1 each by Does not apply route at bedtime. 100 each 3  . metoprolol tartrate (LOPRESSOR) 25 MG tablet TAKE 1 TABLET BY MOUTH  TWICE DAILY 60 tablet 6  . nitroGLYCERIN (NITROSTAT) 0.4 MG SL tablet PLACE 1 TABLET UNDER THE TONGUE EVERY 5 MINUTES FOR 3 DOSES AS NEEDED FOR CHEST PAIN. IF YOU HAVE NO RELIEF AFTER THE 3RD DOSE PROCEED TO TH 90 tablet 3  . ondansetron (ZOFRAN-ODT) 4 MG disintegrating tablet Take 4 mg by mouth every 8 (eight) hours as needed. Nausea and Vomiting    . pantoprazole (PROTONIX) 40 MG tablet TAKE 1 TABLET BY MOUTH TWICE DAILY 60 tablet 5  . simvastatin (ZOCOR) 20 MG tablet Take 1 tablet by mouth daily.    [name] VICTOZA 18 MG/3ML SOPN INJECT 0.3 MLS (1.8 MG TOTAL) INTO THE SKIN DAILY (Patient taking differently: 1.2 mg. ) 9 mL 2   No current facility-administered medications for this visit.     Allergies as of 09/24/2018 - Review Complete 09/24/2018  Allergen Reaction Noted  . Adhesive [tape] Itching 11/29/2011  . Demerol [meperidine]  02/22/2014  . Iohexol Nausea And Vomiting 03/08/2004  . Meperidine hcl Nausea And Vomiting   . Shellfish allergy Diarrhea and Nausea And Vomiting 08/03/2012  . Penicillins Nausea And Vomiting and Rash     ROS:  General: Negative for anorexia, weight loss, fever, chills, fatigue, weakness. ENT: Negative for hoarseness, difficulty swallowing , nasal congestion. CV: Negative for chest pain, angina, palpitations, dyspnea on exertion,  peripheral edema.  Respiratory: Negative for dyspnea at rest, dyspnea on exertion, cough, sputum, wheezing.  GI: See history of present illness. GU:  Negative for dysuria, hematuria, urinary incontinence, urinary frequency, nocturnal urination.  Endo: Negative for unusual weight change.    Physical Examination:   BP 124/79   Pulse 65   Temp (!) 97.1 F (36.2 C) (Oral)   Ht 5\' 2"  (1.575 m)   Wt 181 lb 9.6 oz (82.4 kg)   BMI 33.22 kg/m   General: Well-nourished, well-developed in no acute distress.  Eyes: No icterus. Mouth: Oropharyngeal mucosa moist and pink , no lesions erythema or exudate. Lungs: Clear to auscultation  bilaterally.  Heart: Regular rate and rhythm, no murmurs rubs or gallops.  Abdomen: Bowel sounds are normal, nontender, nondistended, no hepatosplenomegaly or masses, no abdominal bruits or hernia , no rebound or guarding.   Extremities: 1+ pitting edema to the ankles bilaterally. No clubbing or deformities. Neuro: Alert and oriented x 4   Skin: Warm and dry, no jaundice.   Psych: Alert and cooperative, normal mood and affect.

## 2018-09-24 NOTE — Assessment & Plan Note (Signed)
Doing well on Benefiber as needed.  Return to the office in 6 months or sooner if needed.

## 2018-09-25 ENCOUNTER — Encounter: Payer: Self-pay | Admitting: Internal Medicine

## 2018-09-25 NOTE — Progress Notes (Signed)
cc'd to pcp 

## 2018-10-05 ENCOUNTER — Other Ambulatory Visit: Payer: Self-pay | Admitting: Cardiology

## 2018-10-07 ENCOUNTER — Telehealth: Payer: Self-pay | Admitting: *Deleted

## 2018-10-07 NOTE — Telephone Encounter (Signed)
Patient verbally consented for tele-health visits with [facility] and understands that her insurance company will be billed for the encounter.  Aware to have vitals available   

## 2018-10-16 ENCOUNTER — Telehealth (INDEPENDENT_AMBULATORY_CARE_PROVIDER_SITE_OTHER): Payer: Medicare Other | Admitting: Cardiology

## 2018-10-16 ENCOUNTER — Encounter: Payer: Self-pay | Admitting: Cardiology

## 2018-10-16 VITALS — BP 124/62 | HR 65 | Ht 62.0 in | Wt 178.0 lb

## 2018-10-16 DIAGNOSIS — E782 Mixed hyperlipidemia: Secondary | ICD-10-CM

## 2018-10-16 DIAGNOSIS — Z8673 Personal history of transient ischemic attack (TIA), and cerebral infarction without residual deficits: Secondary | ICD-10-CM | POA: Diagnosis not present

## 2018-10-16 DIAGNOSIS — Z9181 History of falling: Secondary | ICD-10-CM

## 2018-10-16 DIAGNOSIS — I6523 Occlusion and stenosis of bilateral carotid arteries: Secondary | ICD-10-CM

## 2018-10-16 DIAGNOSIS — I10 Essential (primary) hypertension: Secondary | ICD-10-CM | POA: Diagnosis not present

## 2018-10-16 DIAGNOSIS — I4821 Permanent atrial fibrillation: Secondary | ICD-10-CM | POA: Diagnosis not present

## 2018-10-16 DIAGNOSIS — Z7189 Other specified counseling: Secondary | ICD-10-CM

## 2018-10-16 MED ORDER — FUROSEMIDE 20 MG PO TABS: 20.0000 mg | ORAL_TABLET | Freq: Every day | ORAL | 0 refills | Status: DC

## 2018-10-16 NOTE — Progress Notes (Signed)
Virtual Visit via Telephone Note   This visit type was conducted due to national recommendations for restrictions regarding the COVID-19 Pandemic (e.g. social distancing) in an effort to limit this patient's exposure and mitigate transmission in our community.  Due to her co-morbid illnesses, this patient is at least at moderate risk for complications without adequate follow up.  This format is felt to be most appropriate for this patient at this time.  The patient did not have access to video technology/had technical difficulties with video requiring transitioning to audio format only (telephone).  All issues noted in this document were discussed and addressed.  No physical exam could be performed with this format.  Please refer to the patient's chart for her  consent to telehealth for [facility].   Date:  10/16/2018   ID:  [name], DOB Oct 04, 1934, MRN [id number]  Patient Location: Home Provider Location: Office  PCP:  Sandi Mealy, MD  Cardiologist:  [name], MD Electrophysiologist:  None   Evaluation Performed:  Follow-Up Visit  Chief Complaint:   Cardiac follow-up  History of Present Illness:    [name] is an 84 y.o. female last seen in March.  She did not have video access and we spoke by phone today.  She tells me that she had a fall several weeks ago, lost her balance when she was turning around, no syncope.  It sounds like she had a fairly large bruise on her hip and thigh area, but based on a follow-up healthcare visit did not have any evidence of fractures.  She has been somewhat sore and stiff since that time.  She plans to talk with her PCP about a referral back to physical therapy which would certainly be reasonable.  She reports compliance with her medications otherwise.  No reported palpitations or chest pain.  She was started on Eliquis at the last visit for stroke prophylaxis with CHADSVASC score of 7.  She is due for follow-up lab work.   She does not report any stool changes or obvious bleeding problems.  Creatinine from earlier in the year ranged from 1.1-1.7.  We may need to cut her Eliquis dose back to 2.5 mg twice daily.  She also reports leg swelling since her prior TIA.  LVEF was vigorous in March.  The patient does not have symptoms concerning for COVID-19 infection (fever, chills, cough, or new shortness of breath).   Past Medical History:  Diagnosis Date  . Anxiety   . Asthma   . Chronic back pain   . Colonoscopy causing post-procedural bleeding    Incomplete. by Dr. [name] 06/11/99 due to fixed non-compliant colon precluded exam to 40 cm, she had subsequent colectomy for diverticulitis since that time.   . Diarrhea   . Esophageal reflux   . Essential hypertension   . Fibromyalgia   . Hiatal hernia    Recent EGD/TCS showed small hiatal hernia, normal apperaring tubular esophagus. s/p passage of a 54-french Maloney dilator, s/p biopsy esophageal mucosa, multiple fundal gland type gastric polyps. one large pedunculated polp with oozing, noted s/p clipping and snare polypectomy. Biopsies of the gastric mucosa taken given her symptoms in her elevated count. normal D1-D3, s/p biospy D2-D3.   [name] Hiatal hernia    all bx unremarkable. on TCS she had left-sided diverticula, evidence of prior segmental resection with anastomosis, multiple colonic polyps s/p multiple snare polypectomies, s/p segmental biopsy and stool sampling. she had multiple tubular adenomas. no microscopic/collagenous colitis. stool  culture, c diff, O+P, lactoferrin were negative.   . Mixed hyperlipidemia   . Nephrolithiasis   . OSA (obstructive sleep apnea)   . Permanent atrial fibrillation    Previous failed DCCV  . Type 2 diabetes mellitus ([place])   . Ureteral stent retained    Not retained. ureteral stent removal gross hematuria resolved 10/11. coumadin restarted.   . Ventral hernia    Past Surgical History:  Procedure Laterality Date  . BREAST  LUMPECTOMY     benign cyst  . CATARACT EXTRACTION W/PHACO Left 08/17/2012   Procedure: CATARACT EXTRACTION PHACO AND INTRAOCULAR LENS PLACEMENT (IOC);  Surgeon: [name], MD;  Location: [facility];  Service: Ophthalmology;  Laterality: Left;  CDE:18.73  . COLECTOMY     2005. Dr. Geoffry Paradise for diverticulitis  . COLONOSCOPY  02/22/09   normal/left-sided diverticula/multiple colonic polyp, adenomatous  . COLONOSCOPY  12/16/2011   colonic polyps-treated as described above. Status postprior sigmoid resection. adenomatous. next TCS 12/2014  . COLONOSCOPY N/A 12/28/2014   Status post prior segmental resection. Few residual colonic diverticula- status post segmental biopsy negative random colon biopsies  . ESOPHAGEAL DILATION N/A 12/28/2014   Procedure: ESOPHAGEAL DILATION;  Surgeon: [name], MD;  Location: [facility];  Service: Endoscopy;  Laterality: N/A;  . ESOPHAGOGASTRODUODENOSCOPY  02/22/09   normal s/p dilator;small HH/one large pedunculates polyp  s/p clipping, hyperplastic  . ESOPHAGOGASTRODUODENOSCOPY  12/16/2011   [name]: small hiatal hernia. Hyperplastic apperating polyps. Status post [name] dilation as described above.  . ESOPHAGOGASTRODUODENOSCOPY N/A 12/28/2014   RMR: Normal-appearing esophagus status post passage of a maloney dilator. Hiatal hernia. abnormal gastic mucosa of uncertain significance status post gastric biopsy with mild chronic gastritis, no H pylori.   . TONSILLECTOMY    . TOTAL ABDOMINAL HYSTERECTOMY       Current Meds  Medication Sig  . acetaminophen (TYLENOL) 325 MG tablet Take 650 mg by mouth every 6 (six) hours as needed for pain.   [name] amLODipine (NORVASC) 5 MG tablet TAKE 1 TABLET BY MOUTH EVERY DAY  . apixaban (ELIQUIS) 5 MG TABS tablet Take 1 tablet (5 mg total) by mouth 2 (two) times daily.  . Continuous Blood Gluc Sensor (FREESTYLE LIBRE SENSOR SYSTEM) MISC Use one sensor every 10 days.  . Insulin Glargine (LANTUS SOLOSTAR) 100 UNIT/ML Solostar Pen INJECT  24 UNITS INTO THE SKIN DAILY AT 10 P.M.  . Insulin Pen Needle (PEN NEEDLES) 31G X 6 MM MISC 1 each by Does not apply route at bedtime.  . metoprolol tartrate (LOPRESSOR) 25 MG tablet TAKE 1 TABLET BY MOUTH TWICE DAILY  . nitroGLYCERIN (NITROSTAT) 0.4 MG SL tablet PLACE 1 TABLET UNDER THE TONGUE EVERY 5 MINUTES FOR 3 DOSES AS NEEDED FOR CHEST PAIN. IF YOU HAVE NO RELIEF AFTER THE 3RD DOSE PROCEED TO TH  . ondansetron (ZOFRAN-ODT) 4 MG disintegrating tablet Take 4 mg by mouth every 8 (eight) hours as needed. Nausea and Vomiting  . pantoprazole (PROTONIX) 40 MG tablet TAKE 1 TABLET BY MOUTH TWICE DAILY  . simvastatin (ZOCOR) 20 MG tablet Take 1 tablet by mouth daily.  [name] VICTOZA 18 MG/3ML SOPN INJECT 0.3 MLS (1.8 MG TOTAL) INTO THE SKIN DAILY (Patient taking differently: 1.2 mg. )     Allergies:   Adhesive [tape], Demerol [meperidine], Iohexol, Meperidine hcl, Shellfish allergy, and Penicillins   Social History   Tobacco Use  . Smoking status: Never Smoker  . Smokeless tobacco: Never Used  . Tobacco comment: tobacco use - no  Substance Use Topics  . Alcohol use: No    Alcohol/week: 0.0 standard drinks  . Drug use: No     Family Hx: The patient's family history includes Colon cancer in her maternal grandfather; Coronary artery disease in an other family member; Depression in her son; Diabetes in her father and mother; Heart attack in her father and mother; Heart disease in her sister; Hernia in her son; Hypertension in her father and mother; Mental retardation in her brother; Pancreatitis in her son.  ROS:   Please see the history of present illness. All other systems reviewed and are negative.   Prior CV studies:   The following studies were reviewed today:  Echocardiogram 06/22/2018 [facility]): Mildly dilated left ventricle with LVEF of 80%, normal right ventricular contraction, mild left atrial enlargement, mild mitral regurgitation, trace aortic regurgitation, mild tricuspid  regurgitation, trivial pericardial effusion.  Carotid Dopplers 06/22/2018 ([place]): Bilateral ICA stenoses of less than 50% with antegrade vertebral flow.  Labs/Other Tests and Data Reviewed:    EKG:  An ECG dated 06/21/2018 was personally reviewed today and demonstrated:  Rate controlled atrial fibrillation with decreased R wave progression and nonspecific T wave changes.  Recent Labs: 11/01/2017: ALT 7; Hemoglobin 10.4; Platelets 286; Potassium 4.3; Sodium 138 07/28/2018: BUN 24; Creatinine 1.7   Recent Lipid Panel Lab Results  Component Value Date/Time   CHOL 123 09/04/2017 08:51 AM   TRIG 116 09/04/2017 08:51 AM   HDL 33 (L) 09/04/2017 08:51 AM   CHOLHDL 3.7 09/04/2017 08:51 AM   LDLCALC 70 09/04/2017 08:51 AM    Wt Readings from Last 3 Encounters:  10/16/18 178 lb (80.7 kg)  09/24/18 181 lb 9.6 oz (82.4 kg)  08/11/18 176 lb (79.8 kg)     Objective:    Vital Signs:  BP 124/62   Pulse 65   Ht 5\' 2"  (1.575 m)   Wt 178 lb (80.7 kg)   BMI 32.56 kg/m    Patient spoke in full sentences, not short of breath. No audible wheezing or coughing. Speech pattern normal.  ASSESSMENT & PLAN:    1.  Permanent atrial fibrillation with CHADSVASC score of 7.  Plan to follow-up CBC and BMET, may need to cut her Eliquis back to 2.5 mg twice daily.  She is prone to falls, but with very high thromboembolic risk score, will try to continue anticoagulation if possible.  Heart rate has been controlled on Lopressor.  She does not report palpitations.  2.  Essential hypertension, blood pressure is well controlled today on current regimen.  No changes were made.  3.  Mixed hyperlipidemia, she remains on Zocor with follow-up by PCP.  Last LDL was 70.  4.  History of TIA.  5.  Leg swelling.  Will initiate low-dose Lasix.  No potassium supplements as yet.  Follow-up lab work arranged.  COVID-19 Education: The signs and symptoms of COVID-19 were discussed with the patient and how to seek  care for testing (follow up with PCP or arrange E-visit).  The importance of social distancing was discussed today.  Time:   Today, I have spent 8 minutes with the patient with telehealth technology discussing the above problems.     Medication Adjustments/Labs and Tests Ordered: Current medicines are reviewed at length with the patient today.  Concerns regarding medicines are outlined above.   Tests Ordered: Orders Placed This Encounter  Procedures  . Basic metabolic panel  . CBC    Medication Changes: Meds ordered this encounter  Medications  . furosemide (LASIX) 20 MG tablet    Sig: Take 1 tablet (20 mg total) by mouth daily.    Dispense:  90 tablet    Refill:  0    10/16/2018 NEW    Follow Up:  In Person 4 to 6 weeks in the [place] office.  Signed, [name], MD  10/16/2018 9:16 AM    [place]

## 2018-10-16 NOTE — Patient Instructions (Addendum)
Medication Instructions:   Your physician has recommended you make the following change in your medication:   Start furosemide 20 mg by mouth daily in the morning  Continue all other medications the same  Labwork:  Your physician recommends that you return for lab work in: 7 days to check your BMET and CBC. Your lab orders have been faxed to [facility].  Testing/Procedures:  NONE  Follow-Up:  Your physician recommends that you schedule a follow-up appointment in: 4-6 weeks for an office visit.  Any Other Special Instructions Will Be Listed Below (If Applicable).  If you need a refill on your cardiac medications before your next appointment, please call your pharmacy.

## 2018-10-22 DIAGNOSIS — E1140 Type 2 diabetes mellitus with diabetic neuropathy, unspecified: Secondary | ICD-10-CM | POA: Diagnosis not present

## 2018-10-22 DIAGNOSIS — I739 Peripheral vascular disease, unspecified: Secondary | ICD-10-CM | POA: Diagnosis not present

## 2018-10-22 DIAGNOSIS — M79671 Pain in right foot: Secondary | ICD-10-CM | POA: Diagnosis not present

## 2018-10-22 DIAGNOSIS — M79672 Pain in left foot: Secondary | ICD-10-CM | POA: Diagnosis not present

## 2018-10-23 DIAGNOSIS — I10 Essential (primary) hypertension: Secondary | ICD-10-CM | POA: Diagnosis not present

## 2018-10-23 DIAGNOSIS — I4821 Permanent atrial fibrillation: Secondary | ICD-10-CM | POA: Diagnosis not present

## 2018-10-27 ENCOUNTER — Telehealth: Payer: Self-pay | Admitting: *Deleted

## 2018-10-27 NOTE — Telephone Encounter (Signed)
Patient informed. Copy sent to PCP and nephrologist.

## 2018-10-27 NOTE — Telephone Encounter (Signed)
-----   Message from [name], MD sent at 10/26/2018 11:54 AM EDT ----- Results reviewed.  She remains anemic at 10.8 but hemoglobin is higher than at last check.  Creatinine is 1.37.  At this point she should continue current dose of Eliquis at 5 mg twice daily.

## 2018-11-05 ENCOUNTER — Other Ambulatory Visit: Payer: Self-pay | Admitting: Gastroenterology

## 2018-11-29 NOTE — Progress Notes (Signed)
Virtual Visit via Telephone Note   This visit type was conducted due to national recommendations for restrictions regarding the COVID-19 Pandemic (e.g. social distancing) in an effort to limit this patient's exposure and mitigate transmission in our community.  Due to her co-morbid illnesses, this patient is at least at moderate risk for complications without adequate follow up.  This format is felt to be most appropriate for this patient at this time.  The patient did not have access to video technology/had technical difficulties with video requiring transitioning to audio format only (telephone).  All issues noted in this document were discussed and addressed.  No physical exam could be performed with this format.  Please refer to the patient's chart for her  consent to telehealth for [facility].   Date:  11/30/2018   ID:  [name], DOB Jan 30, 1935, MRN [id number]  Patient Location: Home Provider Location: Office  PCP:  Sandi Mealy, MD  Cardiologist:  [name], MD Electrophysiologist:  None   Evaluation Performed:  Follow-Up Visit  Chief Complaint:   Cardiac follow-up  History of Present Illness:    [name] is an 84 y.o. female last assessed via telehealth encounter in July.  We spoke by phone today.  She does not specifically report any worsening cardiac symptoms, no chest pain or progressive palpitations.  She states that she has been limited due to body pain and stiffness, mainly her shoulder and hip after having a fall back in May.  She is not able to get around very well.  She does plan to see Dr. Lorra Hals and talk with her about arranging physical therapy assessment.  Recent lab work is outlined below.  Hemoglobin had increased somewhat and creatinine had improved as well.  She does not report any spontaneous bleeding problems.  She states that the previous bruising from her fall has resolved.  The patient does not have symptoms concerning for  COVID-19 infection (fever, chills, cough, or new shortness of breath).    Past Medical History:  Diagnosis Date  . Anxiety   . Asthma   . Chronic back pain   . Colonoscopy causing post-procedural bleeding    Incomplete. by Dr. [name] 06/11/99 due to fixed non-compliant colon precluded exam to 40 cm, she had subsequent colectomy for diverticulitis since that time.   . Diarrhea   . Esophageal reflux   . Essential hypertension   . Fibromyalgia   . Hiatal hernia    Recent EGD/TCS showed small hiatal hernia, normal apperaring tubular esophagus. s/p passage of a 54-french Maloney dilator, s/p biopsy esophageal mucosa, multiple fundal gland type gastric polyps. one large pedunculated polp with oozing, noted s/p clipping and snare polypectomy. Biopsies of the gastric mucosa taken given her symptoms in her elevated count. normal D1-D3, s/p biospy D2-D3.   [name] Hiatal hernia    all bx unremarkable. on TCS she had left-sided diverticula, evidence of prior segmental resection with anastomosis, multiple colonic polyps s/p multiple snare polypectomies, s/p segmental biopsy and stool sampling. she had multiple tubular adenomas. no microscopic/collagenous colitis. stool culture, c diff, O+P, lactoferrin were negative.   . Mixed hyperlipidemia   . Nephrolithiasis   . OSA (obstructive sleep apnea)   . Permanent atrial fibrillation    Previous failed DCCV  . Type 2 diabetes mellitus ([place])   . Ureteral stent retained    Not retained. ureteral stent removal gross hematuria resolved 10/11. coumadin restarted.   . Ventral hernia    Past Surgical  History:  Procedure Laterality Date  . BREAST LUMPECTOMY     benign cyst  . CATARACT EXTRACTION W/PHACO Left 08/17/2012   Procedure: CATARACT EXTRACTION PHACO AND INTRAOCULAR LENS PLACEMENT (IOC);  Surgeon: [name], MD;  Location: [facility];  Service: Ophthalmology;  Laterality: Left;  CDE:18.73  . COLECTOMY     2005. Dr. Geoffry Paradise for diverticulitis  . COLONOSCOPY   02/22/09   normal/left-sided diverticula/multiple colonic polyp, adenomatous  . COLONOSCOPY  12/16/2011   colonic polyps-treated as described above. Status postprior sigmoid resection. adenomatous. next TCS 12/2014  . COLONOSCOPY N/A 12/28/2014   Status post prior segmental resection. Few residual colonic diverticula- status post segmental biopsy negative random colon biopsies  . ESOPHAGEAL DILATION N/A 12/28/2014   Procedure: ESOPHAGEAL DILATION;  Surgeon: [name], MD;  Location: [facility];  Service: Endoscopy;  Laterality: N/A;  . ESOPHAGOGASTRODUODENOSCOPY  02/22/09   normal s/p dilator;small HH/one large pedunculates polyp  s/p clipping, hyperplastic  . ESOPHAGOGASTRODUODENOSCOPY  12/16/2011   [name]: small hiatal hernia. Hyperplastic apperating polyps. Status post [name] dilation as described above.  . ESOPHAGOGASTRODUODENOSCOPY N/A 12/28/2014   RMR: Normal-appearing esophagus status post passage of a maloney dilator. Hiatal hernia. abnormal gastic mucosa of uncertain significance status post gastric biopsy with mild chronic gastritis, no H pylori.   . TONSILLECTOMY    . TOTAL ABDOMINAL HYSTERECTOMY       Current Meds  Medication Sig  . acetaminophen (TYLENOL) 325 MG tablet Take 650 mg by mouth every 6 (six) hours as needed for pain.   [name] amLODipine (NORVASC) 5 MG tablet TAKE 1 TABLET BY MOUTH EVERY DAY  . apixaban (ELIQUIS) 5 MG TABS tablet Take 1 tablet (5 mg total) by mouth 2 (two) times daily.  . Continuous Blood Gluc Sensor (FREESTYLE LIBRE SENSOR SYSTEM) MISC Use one sensor every 10 days.  . furosemide (LASIX) 20 MG tablet Take 1 tablet (20 mg total) by mouth daily.  . Insulin Glargine (LANTUS SOLOSTAR) 100 UNIT/ML Solostar Pen INJECT 24 UNITS INTO THE SKIN DAILY AT 10 P.M.  . Insulin Pen Needle (PEN NEEDLES) 31G X 6 MM MISC 1 each by Does not apply route at bedtime.  . metoprolol tartrate (LOPRESSOR) 25 MG tablet TAKE 1 TABLET BY MOUTH TWICE DAILY  . nitroGLYCERIN  (NITROSTAT) 0.4 MG SL tablet PLACE 1 TABLET UNDER THE TONGUE EVERY 5 MINUTES FOR 3 DOSES AS NEEDED FOR CHEST PAIN. IF YOU HAVE NO RELIEF AFTER THE 3RD DOSE PROCEED TO TH  . ondansetron (ZOFRAN-ODT) 4 MG disintegrating tablet Take 4 mg by mouth every 8 (eight) hours as needed. Nausea and Vomiting  . pantoprazole (PROTONIX) 40 MG tablet TAKE 1 TABLET BY MOUTH TWICE DAILY  . simvastatin (ZOCOR) 20 MG tablet Take 1 tablet by mouth daily.  [name] VICTOZA 18 MG/3ML SOPN INJECT 0.3 MLS (1.8 MG TOTAL) INTO THE SKIN DAILY (Patient taking differently: 1.2 mg. )     Allergies:   Adhesive [tape], Demerol [meperidine], Iohexol, Meperidine hcl, Shellfish allergy, and Penicillins   Social History   Tobacco Use  . Smoking status: Never Smoker  . Smokeless tobacco: Never Used  . Tobacco comment: tobacco use - no  Substance Use Topics  . Alcohol use: No    Alcohol/week: 0.0 standard drinks  . Drug use: No     Family Hx: The patient's family history includes Colon cancer in her maternal grandfather; Coronary artery disease in an other family member; Depression in her son; Diabetes in her father and mother; Heart  attack in her father and mother; Heart disease in her sister; Hernia in her son; Hypertension in her father and mother; Mental retardation in her brother; Pancreatitis in her son.  ROS:   Please see the history of present illness. All other systems reviewed and are negative.   Prior CV studies:   The following studies were reviewed today:  Echocardiogram 06/22/2018([facility]): Mildly dilated left ventricle with LVEF of 80%, normal right ventricular contraction, mild left atrial enlargement, mild mitral regurgitation, trace aortic regurgitation, mild tricuspid regurgitation, trivial pericardial effusion.  Carotid Dopplers 06/22/2018([facility]): Bilateral ICA stenoses of less than 50% with antegrade vertebral flow.  Labs/Other Tests and Data Reviewed:    EKG:  An ECG dated 06/21/2018 was  personally reviewed today and demonstrated:  Rate controlled atrial fibrillation with decreased R wave progression and nonspecific T wave changes.  Recent Labs: 07/28/2018: BUN 24; Creatinine 1.13 October 2018: BUN 26, creatinine 1.37, potassium 4.1, Hgb 10.8, platelets 276  Recent Lipid Panel Lab Results  Component Value Date/Time   CHOL 123 09/04/2017 08:51 AM   TRIG 116 09/04/2017 08:51 AM   HDL 33 (L) 09/04/2017 08:51 AM   CHOLHDL 3.7 09/04/2017 08:51 AM   LDLCALC 70 09/04/2017 08:51 AM    Wt Readings from Last 3 Encounters:  11/30/18 175 lb (79.4 kg)  10/16/18 178 lb (80.7 kg)  09/24/18 181 lb 9.6 oz (82.4 kg)     Objective:    Vital Signs:  BP (!) 156/92   Pulse 68   Temp (!) 97.4 F (36.3 C)   Ht 5\' 2"  (1.575 m)   Wt 175 lb (79.4 kg)   BMI 32.01 kg/m    Patient spoke in full sentences, not short of breath. No audible wheezing or coughing. Speech pattern normal.  ASSESSMENT & PLAN:    1.  Permanent atrial fibrillation with CHADSVASC score of 7.  Recent lab work reviewed.  We will continue Eliquis and Lopressor at current doses.  Follow-up CBC and BMET for next visit.  2.  Mixed hyperlipidemia.  She continues on Zocor.  COVID-19 Education: The signs and symptoms of COVID-19 were discussed with the patient and how to seek care for testing (follow up with PCP or arrange E-visit).  The importance of social distancing was discussed today.  Time:   Today, I have spent 5 minutes with the patient with telehealth technology discussing the above problems.     Medication Adjustments/Labs and Tests Ordered: Current medicines are reviewed at length with the patient today.  Concerns regarding medicines are outlined above.   Tests Ordered: No orders of the defined types were placed in this encounter.   Medication Changes: No orders of the defined types were placed in this encounter.   Follow Up:  In Person 4 months in the [place] office.  Signed, [name], MD   11/30/2018 12:39 PM    [place]

## 2018-11-30 ENCOUNTER — Telehealth (INDEPENDENT_AMBULATORY_CARE_PROVIDER_SITE_OTHER): Payer: Medicare Other | Admitting: Cardiology

## 2018-11-30 ENCOUNTER — Encounter: Payer: Self-pay | Admitting: Cardiology

## 2018-11-30 VITALS — BP 156/92 | HR 68 | Temp 97.4°F | Ht 62.0 in | Wt 175.0 lb

## 2018-11-30 DIAGNOSIS — E782 Mixed hyperlipidemia: Secondary | ICD-10-CM

## 2018-11-30 DIAGNOSIS — I4821 Permanent atrial fibrillation: Secondary | ICD-10-CM | POA: Diagnosis not present

## 2018-11-30 DIAGNOSIS — I10 Essential (primary) hypertension: Secondary | ICD-10-CM

## 2018-11-30 NOTE — Patient Instructions (Signed)
Medication Instructions:  Continue all current medications.  Labwork:  CBC, BMET   Due just prior to next visit.   Testing/Procedures: none  Follow-Up: 4 months   Any Other Special Instructions Will Be Listed Below (If Applicable).  If you need a refill on your cardiac medications before your next appointment, please call your pharmacy.

## 2018-12-04 ENCOUNTER — Other Ambulatory Visit: Payer: Self-pay | Admitting: "Endocrinology

## 2018-12-04 ENCOUNTER — Other Ambulatory Visit: Payer: Self-pay | Admitting: Cardiology

## 2018-12-07 DIAGNOSIS — E1122 Type 2 diabetes mellitus with diabetic chronic kidney disease: Secondary | ICD-10-CM | POA: Diagnosis not present

## 2018-12-07 DIAGNOSIS — N183 Chronic kidney disease, stage 3 (moderate): Secondary | ICD-10-CM | POA: Diagnosis not present

## 2018-12-07 LAB — HEMOGLOBIN A1C: Hemoglobin A1C: 6.8

## 2018-12-07 LAB — BASIC METABOLIC PANEL
BUN: 19 (ref 4–21)
Creatinine: 1.2 — AB (ref 0.5–1.1)

## 2018-12-15 ENCOUNTER — Other Ambulatory Visit: Payer: Self-pay

## 2018-12-15 ENCOUNTER — Encounter: Payer: Self-pay | Admitting: "Endocrinology

## 2018-12-15 ENCOUNTER — Ambulatory Visit (INDEPENDENT_AMBULATORY_CARE_PROVIDER_SITE_OTHER): Payer: Medicare Other | Admitting: "Endocrinology

## 2018-12-15 DIAGNOSIS — I10 Essential (primary) hypertension: Secondary | ICD-10-CM | POA: Diagnosis not present

## 2018-12-15 DIAGNOSIS — I6523 Occlusion and stenosis of bilateral carotid arteries: Secondary | ICD-10-CM

## 2018-12-15 DIAGNOSIS — E1122 Type 2 diabetes mellitus with diabetic chronic kidney disease: Secondary | ICD-10-CM

## 2018-12-15 DIAGNOSIS — E782 Mixed hyperlipidemia: Secondary | ICD-10-CM

## 2018-12-15 DIAGNOSIS — N183 Chronic kidney disease, stage 3 (moderate): Secondary | ICD-10-CM | POA: Diagnosis not present

## 2018-12-15 MED ORDER — LANTUS SOLOSTAR 100 UNIT/ML ~~LOC~~ SOPN: 20.0000 [IU] | PEN_INJECTOR | Freq: Every day | SUBCUTANEOUS | 2 refills | Status: DC
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
## 2018-12-17 DIAGNOSIS — N183 Chronic kidney disease, stage 3 (moderate): Secondary | ICD-10-CM | POA: Diagnosis not present

## 2018-12-17 DIAGNOSIS — L304 Erythema intertrigo: Secondary | ICD-10-CM | POA: Diagnosis not present

## 2018-12-17 DIAGNOSIS — I10 Essential (primary) hypertension: Secondary | ICD-10-CM | POA: Diagnosis not present

## 2018-12-17 DIAGNOSIS — R300 Dysuria: Secondary | ICD-10-CM | POA: Diagnosis not present

## 2018-12-17 DIAGNOSIS — Z13220 Encounter for screening for lipoid disorders: Secondary | ICD-10-CM | POA: Diagnosis not present

## 2018-12-17 DIAGNOSIS — Z0000 Encounter for general adult medical examination without abnormal findings: Secondary | ICD-10-CM | POA: Diagnosis not present

## 2018-12-17 DIAGNOSIS — Z136 Encounter for screening for cardiovascular disorders: Secondary | ICD-10-CM | POA: Diagnosis not present

## 2018-12-17 DIAGNOSIS — I4821 Permanent atrial fibrillation: Secondary | ICD-10-CM | POA: Diagnosis not present

## 2018-12-17 DIAGNOSIS — E1122 Type 2 diabetes mellitus with diabetic chronic kidney disease: Secondary | ICD-10-CM | POA: Diagnosis not present

## 2018-12-17 DIAGNOSIS — R7302 Impaired glucose tolerance (oral): Secondary | ICD-10-CM | POA: Diagnosis not present

## 2019-01-07 DIAGNOSIS — M79671 Pain in right foot: Secondary | ICD-10-CM | POA: Diagnosis not present

## 2019-01-07 DIAGNOSIS — I739 Peripheral vascular disease, unspecified: Secondary | ICD-10-CM | POA: Diagnosis not present

## 2019-01-07 DIAGNOSIS — M79672 Pain in left foot: Secondary | ICD-10-CM | POA: Diagnosis not present

## 2019-01-07 DIAGNOSIS — E1140 Type 2 diabetes mellitus with diabetic neuropathy, unspecified: Secondary | ICD-10-CM | POA: Diagnosis not present

## 2019-01-21 ENCOUNTER — Other Ambulatory Visit: Payer: Self-pay | Admitting: Cardiology

## 2019-01-21 MED ORDER — FUROSEMIDE 20 MG PO TABS: 20.0000 mg | ORAL_TABLET | Freq: Every day | ORAL | 1 refills | Status: DC

## 2019-01-21 NOTE — Telephone Encounter (Signed)
Done

## 2019-01-21 NOTE — Telephone Encounter (Signed)
°*  STAT* If patient is at the pharmacy, call can be transferred to refill team.   1. Which medications need to be refilled? furosemide (LASIX) 20 MG tablet   2. Which pharmacy/location (including street and city if local pharmacy) is medication to be sent to? [facility]  3. Do they need a 30 day or 90 day supply? [place]

## 2019-01-24 ENCOUNTER — Other Ambulatory Visit: Payer: Self-pay | Admitting: Cardiology

## 2019-02-11 ENCOUNTER — Other Ambulatory Visit: Payer: Self-pay | Admitting: "Endocrinology

## 2019-03-09 ENCOUNTER — Encounter: Payer: Self-pay | Admitting: *Deleted

## 2019-03-24 ENCOUNTER — Ambulatory Visit: Payer: Medicare Other | Admitting: Gastroenterology

## 2019-04-05 ENCOUNTER — Ambulatory Visit (INDEPENDENT_AMBULATORY_CARE_PROVIDER_SITE_OTHER): Payer: Medicare Other | Admitting: Family Medicine

## 2019-04-05 ENCOUNTER — Encounter: Payer: Self-pay | Admitting: Cardiology

## 2019-04-05 ENCOUNTER — Other Ambulatory Visit: Payer: Self-pay

## 2019-04-05 VITALS — BP 151/76 | HR 73 | Ht 62.0 in | Wt 182.0 lb

## 2019-04-05 DIAGNOSIS — I4821 Permanent atrial fibrillation: Secondary | ICD-10-CM | POA: Diagnosis not present

## 2019-04-05 DIAGNOSIS — I6523 Occlusion and stenosis of bilateral carotid arteries: Secondary | ICD-10-CM

## 2019-04-05 DIAGNOSIS — E782 Mixed hyperlipidemia: Secondary | ICD-10-CM | POA: Diagnosis not present

## 2019-04-05 DIAGNOSIS — I10 Essential (primary) hypertension: Secondary | ICD-10-CM | POA: Diagnosis not present

## 2019-04-05 NOTE — Progress Notes (Addendum)
Cardiology Office Note  Date: 04/05/2019   ID: [name] 1934/10/12, MRN [id number]  PCP:  [name] Mealy, MD  Cardiologist:  [name], MD Electrophysiologist:  None   Chief Complaint  Patient presents with  . Follow-up    History of Present Illness: [name] is a 84 y.o. female here for follow-up for history of atrial fibrillation and hyperlipidemia.  Last encounter was with Dr. Domenic Polite November 30, 2018 via telehealth visit.  Patient states he has been doing well since last visit.  Denies any progressive anginal or exertional symptoms.  Denies any significant palpitations or arrhythmias.  States she does have occasional brief palpitations that are not very frequent.  Admits she has some dizziness at times, but denies any syncopal or near syncopal episodes.  Patient states sometimes she decreases her diuretic dose to half a pill due to frequent trips to the restroom and increased dizziness after taking the medication.  Blood pressure elevated on arrival.  Recheck in left arm 132/80.  Patient denies any recent exposure to Covid virus or any symptoms such as fever, cough, or shortness of breath.  Cardiac medication reviewed with patient today.    Past Medical History:  Diagnosis Date  . Anxiety   . Asthma   . Chronic back pain   . Colonoscopy causing post-procedural bleeding    Incomplete. by Dr. [name] 06/11/99 due to fixed non-compliant colon precluded exam to 40 cm, she had subsequent colectomy for diverticulitis since that time.   . Diarrhea   . Esophageal reflux   . Essential hypertension   . Fibromyalgia   . Hiatal hernia    Recent EGD/TCS showed small hiatal hernia, normal apperaring tubular esophagus. s/p passage of a 54-french Maloney dilator, s/p biopsy esophageal mucosa, multiple fundal gland type gastric polyps. one large pedunculated polp with oozing, noted s/p clipping and snare polypectomy. Biopsies of the gastric mucosa taken  given her symptoms in her elevated count. normal D1-D3, s/p biospy D2-D3.   [name] Hiatal hernia    all bx unremarkable. on TCS she had left-sided diverticula, evidence of prior segmental resection with anastomosis, multiple colonic polyps s/p multiple snare polypectomies, s/p segmental biopsy and stool sampling. she had multiple tubular adenomas. no microscopic/collagenous colitis. stool culture, c diff, O+P, lactoferrin were negative.   . Mixed hyperlipidemia   . Nephrolithiasis   . OSA (obstructive sleep apnea)   . Permanent atrial fibrillation (HCC)    Previous failed DCCV  . Type 2 diabetes mellitus ([place])   . Ureteral stent retained    Not retained. ureteral stent removal gross hematuria resolved 10/11. coumadin restarted.   . Ventral hernia     Past Surgical History:  Procedure Laterality Date  . BREAST LUMPECTOMY     benign cyst  . CATARACT EXTRACTION W/PHACO Left 08/17/2012   Procedure: CATARACT EXTRACTION PHACO AND INTRAOCULAR LENS PLACEMENT (IOC);  Surgeon: [name], MD;  Location: [facility];  Service: Ophthalmology;  Laterality: Left;  CDE:18.73  . COLECTOMY     2005. Dr. Geoffry Paradise for diverticulitis  . COLONOSCOPY  02/22/09   normal/left-sided diverticula/multiple colonic polyp, adenomatous  . COLONOSCOPY  12/16/2011   colonic polyps-treated as described above. Status postprior sigmoid resection. adenomatous. next TCS 12/2014  . COLONOSCOPY N/A 12/28/2014   Status post prior segmental resection. Few residual colonic diverticula- status post segmental biopsy negative random colon biopsies  . ESOPHAGEAL DILATION N/A 12/28/2014   Procedure: ESOPHAGEAL DILATION;  Surgeon: [name], MD;  Location: [facility];  Service: Endoscopy;  Laterality: N/A;  . ESOPHAGOGASTRODUODENOSCOPY  02/22/09   normal s/p dilator;small HH/one large pedunculates polyp  s/p clipping, hyperplastic  . ESOPHAGOGASTRODUODENOSCOPY  12/16/2011   [name]: small hiatal hernia. Hyperplastic apperating polyps. Status  post [name] dilation as described above.  . ESOPHAGOGASTRODUODENOSCOPY N/A 12/28/2014   RMR: Normal-appearing esophagus status post passage of a maloney dilator. Hiatal hernia. abnormal gastic mucosa of uncertain significance status post gastric biopsy with mild chronic gastritis, no H pylori.   . TONSILLECTOMY    . TOTAL ABDOMINAL HYSTERECTOMY      Current Outpatient Medications  Medication Sig Dispense Refill  . acetaminophen (TYLENOL) 325 MG tablet Take 650 mg by mouth every 6 (six) hours as needed for pain.     [name] amLODipine (NORVASC) 5 MG tablet TAKE 1 TABLET BY MOUTH EVERY DAY 90 tablet 3  . Continuous Blood Gluc Sensor (FREESTYLE LIBRE SENSOR SYSTEM) MISC Use one sensor every 10 days. 3 each 2  . ELIQUIS 5 MG TABS tablet TAKE 1 TABLET BY MOUTH TWICE DAILY 60 tablet 6  . furosemide (LASIX) 20 MG tablet Take 1 tablet (20 mg total) by mouth daily. 90 tablet 1  . Insulin Glargine (LANTUS SOLOSTAR) 100 UNIT/ML Solostar Pen Inject 20 Units into the skin at bedtime. 15 mL 2  . Insulin Pen Needle (PEN NEEDLES) 31G X 6 MM MISC 1 each by Does not apply route at bedtime. 100 each 3  . liraglutide (VICTOZA) 18 MG/3ML SOPN Inject 0.3 mLs (1.8 mg total) into the skin daily. 9 mL 2  . metoprolol tartrate (LOPRESSOR) 25 MG tablet TAKE 1 TABLET BY MOUTH TWICE DAILY 60 tablet 6  . nitroGLYCERIN (NITROSTAT) 0.4 MG SL tablet PLACE 1 TABLET UNDER THE TONGUE EVERY 5 MINUTES FOR 3 DOSES AS NEEDED FOR CHEST PAIN. IF YOU HAVE NO RELIEF AFTER THE 3RD DOSE PROCEED TO TH 90 tablet 3  . ondansetron (ZOFRAN-ODT) 4 MG disintegrating tablet Take 4 mg by mouth every 8 (eight) hours as needed. Nausea and Vomiting    . pantoprazole (PROTONIX) 40 MG tablet TAKE 1 TABLET BY MOUTH TWICE DAILY 60 tablet 5  . simvastatin (ZOCOR) 20 MG tablet Take 1 tablet by mouth daily.     No current facility-administered medications for this visit.   Allergies:  Adhesive [tape], Demerol [meperidine], Iohexol, Meperidine hcl, Shellfish  allergy, and Penicillins   Social History: The patient  reports that she has never smoked. She has never used smokeless tobacco. She reports that she does not drink alcohol or use drugs.   Family History: The patient's family history includes Colon cancer in her maternal grandfather; Coronary artery disease in an other family member; Depression in her son; Diabetes in her father and mother; Heart attack in her father and mother; Heart disease in her sister; Hernia in her son; Hypertension in her father and mother; Mental retardation in her brother; Pancreatitis in her son.   ROS:  Please see the history of present illness. Otherwise, complete review of systems is positive for none.  All other systems are reviewed and negative.   Physical Exam: VS:  BP (!) 151/76   Pulse 73   Ht 5\' 2"  (1.575 m)   Wt 182 lb (82.6 kg)   BMI 33.29 kg/m , BMI Body mass index is 33.29 kg/m.  Wt Readings from Last 3 Encounters:  04/05/19 182 lb (82.6 kg)  11/30/18 175 lb (79.4 kg)  10/16/18 178 lb (80.7 kg)  General: Patient appears comfortable at rest. Neck: Supple, no elevated JVP or carotid bruits, no thyromegaly. Lungs: Clear to auscultation, nonlabored breathing at rest. Cardiac: Irregularly irregular, no S3 or significant systolic murmur, no pericardial rub. Abdomen: Soft, nontender, no hepatomegaly, bowel sounds present, no guarding or rebound. Extremities: No pitting edema, distal pulses 2+. Skin: Warm and dry. Neuropsychiatric: Alert and oriented x3, affect grossly appropriate.  ECG:  An ECG dated 10/01/2017 was personally reviewed today and demonstrated:  Atrial fibrillation rate of 83.  Poor R wave progression rule out old anterior infarct pattern.  Nonspecific T abnormality  Recent Labwork: 12/07/2018: BUN 19; Creatinine 1.2     Component Value Date/Time   CHOL 123 09/04/2017 0851   TRIG 116 09/04/2017 0851   HDL 33 (L) 09/04/2017 0851   CHOLHDL 3.7 09/04/2017 0851   VLDL 35 07/11/2009  2342   LDLCALC 70 09/04/2017 0851    Other Studies Reviewed Today:  Echocardiogram 06/22/2018([facility]): Mildly dilated left ventricle with LVEF of 80%, normal right ventricular contraction, mild left atrial enlargement, mild mitral regurgitation, trace aortic regurgitation, mild tricuspid regurgitation, trivial pericardial effusion.  Carotid Dopplers 06/22/2018([facility]): Bilateral ICA stenoses of less than 50% with antegrade vertebral flow.  Assessment and Plan:  1. Permanent atrial fibrillation ([place])   2. Mixed hyperlipidemia   3. Essential hypertension      1. Permanent atrial fibrillation (HCC) Heart rate today 73 and irregularly irregular.  Patient admits to occasional brief episodes of palpitations but no sustained episodes of rapid heart rate or sensation of fluttering.  Continue Eliquis 5 mg tablets p.o. twice daily, metoprolol 25 mg p.o. twice daily. Get CBC and BMP.  2. Mixed hyperlipidemia Continue simvastatin 20 mg daily.  Last lipid panel 1 year ago cholesterol 123, HDL 33, triglycerides 116, LDL 70  3. Essential hypertension Initial blood pressure on arrival today 151/76.  Recheck in left arm 132/80.  Patient states she is experiencing some stress at home with 2 sons who live with her 1 of which is bipolar.  States this may account for her blood pressure being elevated today on arrival.  Continue amlodipine 5 mg daily.  Lasix 20 mg daily.   Medication Adjustments/Labs and Tests Ordered: Current medicines are reviewed at length with the patient today.  Concerns regarding medicines are outlined above.    Patient Instructions  Medication Instructions:  Continue all current medications.  Labwork: NONE  Testing/Procedures: NONE  Follow-Up: Your physician wants you to follow up in: 6 months.  You will receive a reminder letter in the mail one-two months in advance.  If you don't receive a letter, please call our office to schedule the follow up  appointment   Any Other Special Instructions Will Be Listed Below (If Applicable).  If you need a refill on your cardiac medications before your next appointment, please call your pharmacy.         Signed, Levell July, NP 04/05/2019 2:55 PM    [facility] [address], [place], [place] [address] Phone: [phone]; Fax: [phone]

## 2019-04-05 NOTE — Patient Instructions (Signed)
Medication Instructions:  Continue all current medications.  Labwork: NONE  Testing/Procedures: NONE  Follow-Up: Your physician wants you to follow up in: 6 months.  You will receive a reminder letter in the mail one-two months in advance.  If you don't receive a letter, please call our office to schedule the follow up appointment.    Any Other Special Instructions Will Be Listed Below (If Applicable).  If you need a refill on your cardiac medications before your next appointment, please call your pharmacy.  

## 2019-04-07 ENCOUNTER — Other Ambulatory Visit: Payer: Self-pay | Admitting: *Deleted

## 2019-04-07 DIAGNOSIS — I4821 Permanent atrial fibrillation: Secondary | ICD-10-CM

## 2019-04-07 DIAGNOSIS — Z7901 Long term (current) use of anticoagulants: Secondary | ICD-10-CM

## 2019-04-19 ENCOUNTER — Ambulatory Visit (INDEPENDENT_AMBULATORY_CARE_PROVIDER_SITE_OTHER): Payer: Medicare Other | Admitting: "Endocrinology

## 2019-04-19 ENCOUNTER — Encounter: Payer: Self-pay | Admitting: "Endocrinology

## 2019-04-19 ENCOUNTER — Other Ambulatory Visit: Payer: Self-pay

## 2019-04-19 VITALS — BP 149/77 | HR 56 | Ht 62.0 in | Wt 179.8 lb

## 2019-04-19 DIAGNOSIS — I10 Essential (primary) hypertension: Secondary | ICD-10-CM | POA: Diagnosis not present

## 2019-04-19 DIAGNOSIS — E1122 Type 2 diabetes mellitus with diabetic chronic kidney disease: Secondary | ICD-10-CM | POA: Diagnosis not present

## 2019-04-19 DIAGNOSIS — E782 Mixed hyperlipidemia: Secondary | ICD-10-CM | POA: Diagnosis not present

## 2019-04-19 DIAGNOSIS — N183 Chronic kidney disease, stage 3 unspecified: Secondary | ICD-10-CM

## 2019-04-19 LAB — POCT GLYCOSYLATED HEMOGLOBIN (HGB A1C): Hemoglobin A1C: 7.4 % — AB (ref 4.0–5.6)

## 2019-04-19 MED ORDER — LANTUS SOLOSTAR 100 UNIT/ML ~~LOC~~ SOPN: 24.0000 [IU] | PEN_INJECTOR | Freq: Every day | SUBCUTANEOUS | 2 refills | Status: DC
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
## 2019-04-20 ENCOUNTER — Ambulatory Visit (INDEPENDENT_AMBULATORY_CARE_PROVIDER_SITE_OTHER): Payer: Medicare Other | Admitting: Gastroenterology

## 2019-04-20 VITALS — BP 143/79 | HR 76 | Temp 96.6°F | Ht 62.0 in | Wt 180.6 lb

## 2019-04-20 DIAGNOSIS — K582 Mixed irritable bowel syndrome: Secondary | ICD-10-CM

## 2019-04-20 DIAGNOSIS — K219 Gastro-esophageal reflux disease without esophagitis: Secondary | ICD-10-CM

## 2019-04-20 NOTE — Progress Notes (Signed)
CC'ED TO PCP 

## 2019-04-20 NOTE — Patient Instructions (Signed)
I will try and find out what medication you were prescribed in [place].  We will see you in 6-8 months or sooner if needed!  Continue with Protonix twice a day, 30 minutes before breakfast and dinner.  It was a pleasure to see you today. I want to create trusting relationships with patients to provide genuine, compassionate, and quality care. I value your feedback. If you receive a survey regarding your visit,  I greatly appreciate you taking time to fill this out.   Annitta Needs, PhD, ANP-BC [facility]

## 2019-04-21 IMAGING — CT CT CERVICAL SPINE W/O CM
2 series · 10 of 14 positions shown, 12 images · non-contrast
Comparison: CT head and cervical spine 09/11/2017.

CLINICAL DATA: Nondisplaced fracture of the second vertebral body.

EXAM:
CT CERVICAL SPINE WITHOUT CONTRAST
TECHNIQUE: Multidetector CT imaging of the cervical spine was performed without
intravenous contrast. Multiplanar CT image reconstructions were also
generated.

[Series 3: cspine soft · axial · 0.34mm/px · z∈[-248,-128]mm · 5 of 92 slices shown]
[im 16/92  soft-tissue]
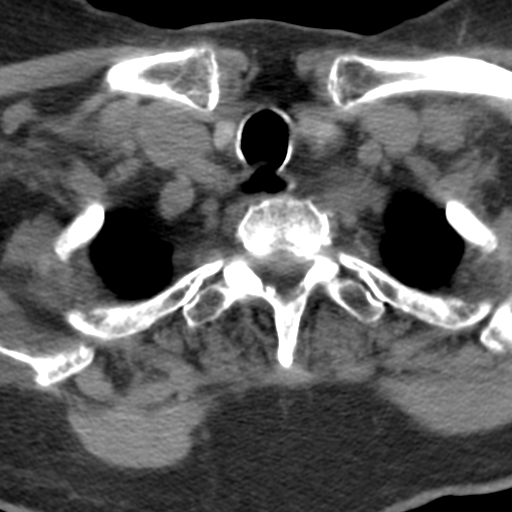
[im 31/92  soft-tissue]
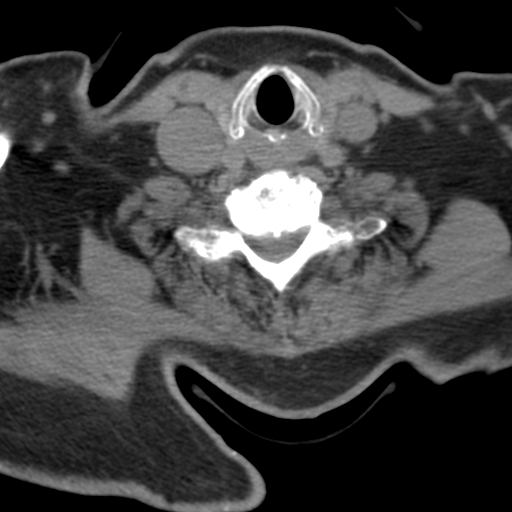
[im 46/92  soft-tissue]
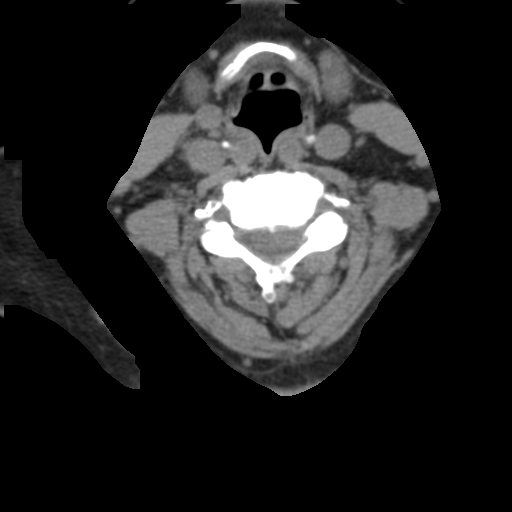
[im 61/92  soft-tissue]
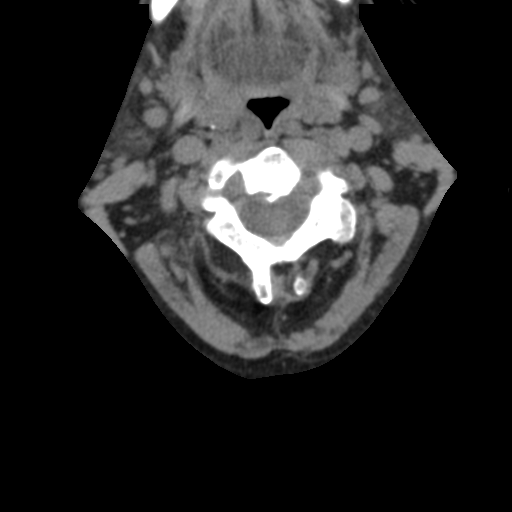
[im 76/92  soft-tissue]
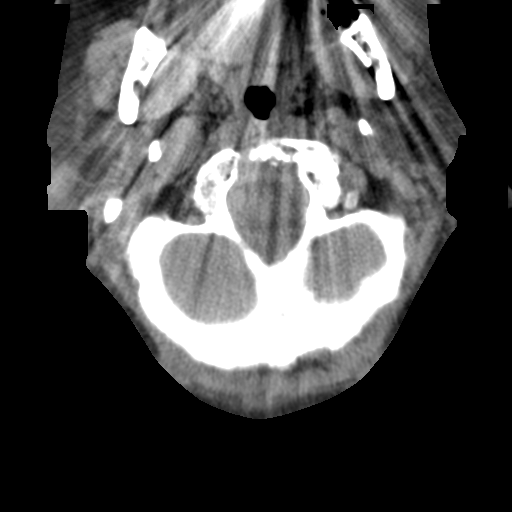

[Series 9: angled axial · axial · 0.30mm/px · z∈[-261,-141]mm · 5 of 93 slices shown, 7 images]
[im 16/93  soft-tissue]
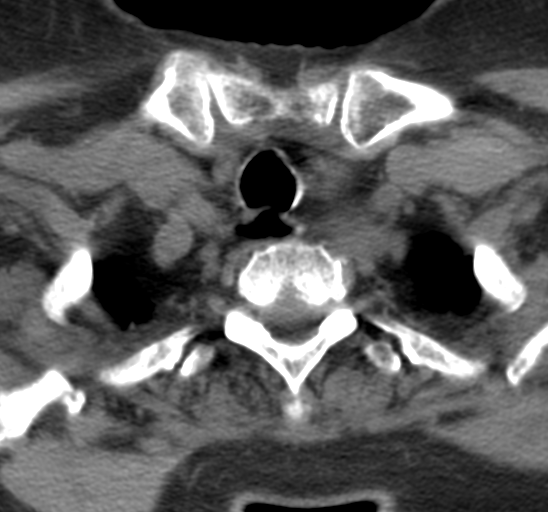
[im 16/93  bone]
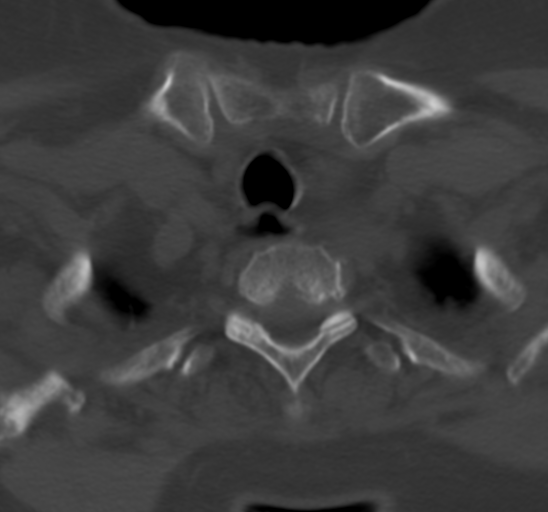
[im 31/93  bone]
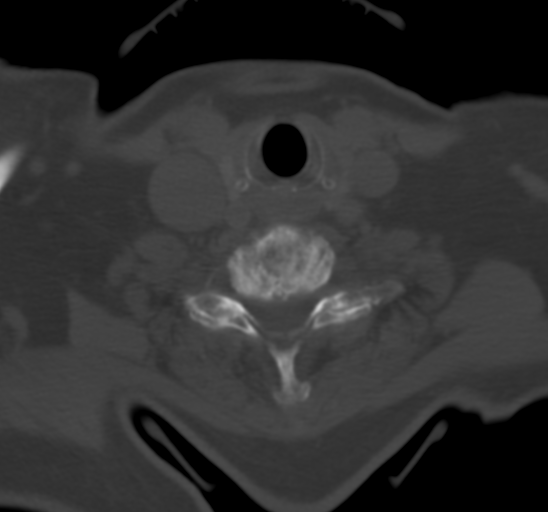
[im 47/93  bone]
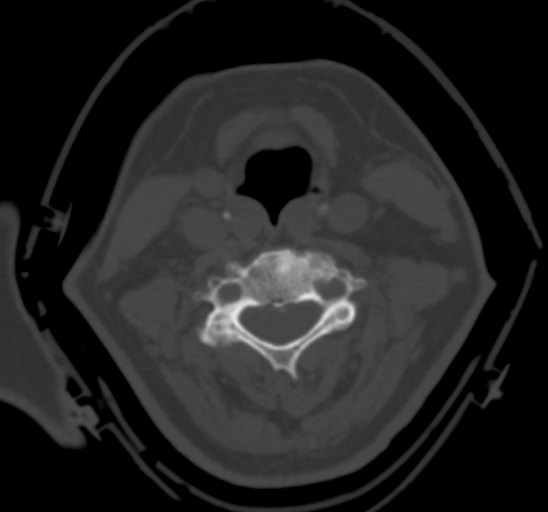
[im 62/93  bone]
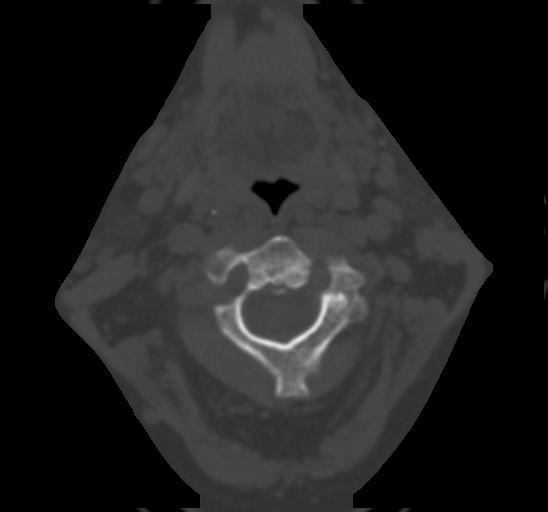
[im 77/93  soft-tissue]
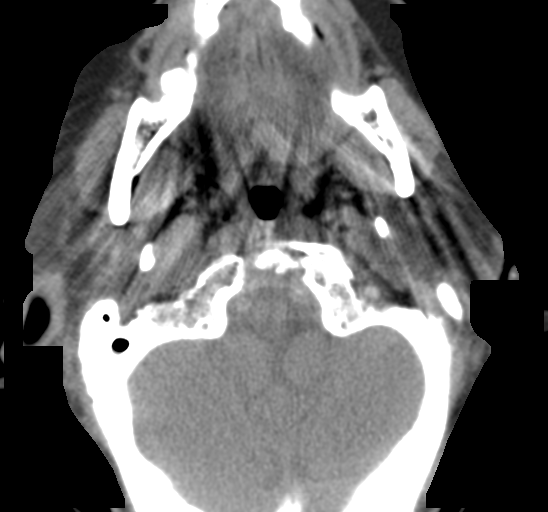
[im 77/93  bone]
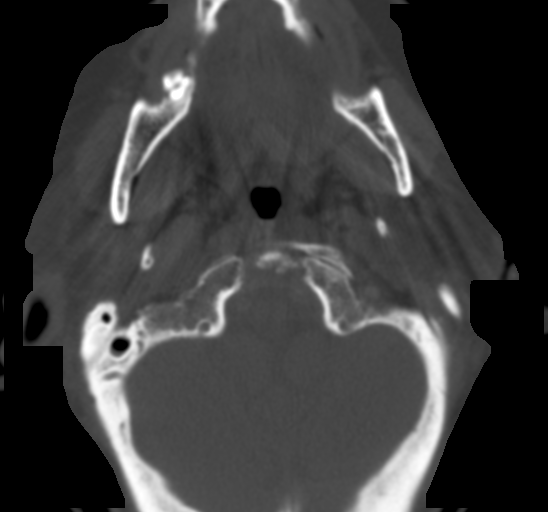

[10 of 14 positions shown; findings below may reference images not displayed]

FINDINGS: Alignment: Posttraumatic anterolisthesis at C2-3 is stable. Grade 1
degenerative anterolisthesis at C3-4 is stable. AP alignment is
otherwise anatomic.

There is bridging bone across the coronal fracture plane along the
dorsal aspect of C2. Right lateral mass fracture has healed as well.

The spinous process fracture at C7 has healed.

No new fractures are present.

Skull base and vertebrae: The craniocervical junction is within
normal limits.

Soft tissues and spinal canal: Atherosclerotic calcifications are
present at the aortic arch and carotid bifurcations, similar the
prior exam. No significant adenopathy is present. Salivary glands
are within normal limits. A calcified left thyroid nodule is less
than 1 cm.

Disc levels: Uncovertebral and endplate spurring results in
bilateral osseous foraminal narrowing at C3-4, C4-5, C5-6, and C6-7.

Upper chest: The lung apices are clear. Thoracic inlet is within
normal limits. Atherosclerotic calcifications are present at the
arch.
IMPRESSION: 1. Cervical spine fractures involving C2 and C7 have healed.
2. No new fractures.

## 2019-04-29 LAB — HM DIABETES EYE EXAM

## 2019-05-04 ENCOUNTER — Other Ambulatory Visit: Payer: Self-pay | Admitting: Gastroenterology

## 2019-05-04 ENCOUNTER — Other Ambulatory Visit: Payer: Self-pay | Admitting: Cardiology

## 2019-05-15 ENCOUNTER — Other Ambulatory Visit: Payer: Self-pay | Admitting: "Endocrinology

## 2019-05-27 ENCOUNTER — Telehealth: Payer: Self-pay | Admitting: *Deleted

## 2019-05-27 NOTE — Telephone Encounter (Signed)
Patient informed. Copy sent to PCP °

## 2019-05-27 NOTE — Telephone Encounter (Signed)
-----   Message from Erma Heritage, [place] sent at 05/26/2019  8:47 PM EST ----- Covering for Dr. Domenic Polite - Please let the patient know her kidney function and electrolytes remain stable. She is anemic with Hgb at 10.2 but this is stable when compared to prior labs as Hgb was 10.8 in 10/2018. Continue current medication regimen at this time. Please forward a copy of results to Sandi Mealy, MD.

## 2019-06-04 ENCOUNTER — Other Ambulatory Visit: Payer: Self-pay | Admitting: "Endocrinology

## 2019-06-14 ENCOUNTER — Other Ambulatory Visit: Payer: Self-pay | Admitting: Cardiology

## 2019-07-07 NOTE — H&P (Signed)
Surgical History & Physical  Patient Name: [name] DOB: 23-Jun-1934  Surgery: Cataract extraction with intraocular lens implant phacoemulsification; Right Eye  Surgeon: [name] MD Surgery Date:  07/19/2019 Pre-Op Date:  07/07/2019  HPI: A 16 Yr. old female patient 1. 1. The patient complains of difficulty when viewing TV, reading closed caption, news scrolls on TV, which began 1 year ago. The right eye is affected. The episode is gradual. The patient describes hazy symptoms affecting their eyes/vision. Pt is needing more light to read. Pt has had several falls and her depth perception is off. This is negatively affecting the patient's quality of life. Eyes are really dry and they water a lot. Pt is using tears. Last A1C was just over 7.0. Pt has ARMD and was getting injections OS. HPI Completed by Dr. [name]  Medical History: Cataract OD CE/IOL OS 5/14 Full thickness macular hole OD Mild NPDR OS H/O ERM OS ERM OU Arthritis Diabetes - DM Type 2 GERD Anxiety Fibromyalgia H/O migraine headach... High Blood Pressure  Review of Systems Negative Allergic/Immunologic Negative Cardiovascular Negative Constitutional Negative Ear, Nose, Mouth & Throat Negative Endocrine Negative Eyes Negative Gastrointestinal Negative Genitourinary Negative Hemotologic/Lymphatic Negative Integumentary Negative Musculoskeletal Negative Neurological Negative Psychiatry Negative Respiratory  Social   Never Smoked  Medication Amlodipine Besylate, Hydrochlorothiazide, Nitroglycerin, Pantoprazole, Simvastatin, Lantus, Claritin, Apixaban, Metoprolol, Nystatin, Zofran, Proair respiClick, Victoza,   Sx/Procedures CE/IOL,  Brain Surgery,   Drug Allergies  Demoral, Iodine, Penicillins,   History & Physical: Heent:  Cataract, Right eye NECK: supple without bruits LUNGS: lungs clear to auscultation CV: regular rate and rhythm Abdomen: soft and non-tender  Impression &  Plan: Assessment: 1.  COMBINED FORMS AGE RELATED CATARACT; Right Eye (H25.811) 2.  PCO; Left Eye (H26.492) 3.  DM Type 2; Left Mod Without ME [id number] 4.  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  MACULAR CYST/HOLE/PSEUDOHOLE; Left Eye (H35.342) 6.  BLINDNESS LEFT EYE CATEGORY 5, NORMAL VISION RIGHT EYE (H54.42A5)  Plan: 1.  Cataract accounts for the patient's decreased vision. This visual impairment is not correctable with a tolerable change in glasses or contact lenses. Cataract surgery with an implantation of a new lens should significantly improve the visual and functional status of the patient. Discussed all risks, benefits, alternatives, and potential complications. Discussed the procedures and recovery. Patient desires to have surgery. A-scan ordered and performed today for intra-ocular lens calculations. The surgery will be performed in order to improve vision for driving, reading, and for eye examinations. Recommend phacoemulsification with intra-ocular lens. Right Eye. Dilates well - shugarcaine by protocol. 2.  Symptomatic and visually significant. Accounts for patient's symptoms and is negatively affecting the patient's quality of life. Reviewed risks of the Yag laser including IOP spike, retinal detachment, and chronic inflammation. Patient wishes to proceed. Schedule patient for YAG capsulotomy at 1 week PO visit for OD. 3.  Recommend good blood sugar control. 4.  Monitor. 5.  Stable. 6.  Monocular precautions discussed, including wearing shatterproof lenses.

## 2019-07-14 NOTE — Patient Instructions (Signed)
CERI HEIDTKE  07/14/2019     @PREFPERIOPPHARMACY @   Your procedure is scheduled on  07/19/2019 .  Report to [facility] at  [phone]  A.M.  Call this number if you have problems the morning of surgery:  [phone]   Remember:  Do not eat or drink after midnight.                        Take these medicines the morning of surgery with A SIP OF WATER  Amlodipine, metoprolol, protonix, zofran (if needed). Use your inhaler before you come. Take 1/2 (12Units)  of your night time insulin the night before your surgery. DO NOT take any medications for diabetes the morning of your surgery.    Do not wear jewelry, make-up or nail polish.  Do not wear lotions, powders, or perfumes. Please wear deodorant and brush your teeth.  Do not shave 48 hours prior to surgery.  Men may shave face and neck.  Do not bring valuables to the hospital.  [facility] is not responsible for any belongings or valuables.  Contacts, dentures or bridgework may not be worn into surgery.  Leave your suitcase in the car.  After surgery it may be brought to your room.  For patients admitted to the hospital, discharge time will be determined by your treatment team.  Patients discharged the day of surgery will not be allowed to drive home.   Name and phone number of your driver:   family Special instructions:  DO NOT smoke the morning of your surgery.  Please read over the following fact sheets that you were given. Anesthesia Post-op Instructions and Care and Recovery After Surgery       Cataract Surgery, Care After This sheet gives you information about how to care for yourself after your procedure. Your health care provider may also give you more specific instructions. If you have problems or questions, contact your health care provider. What can I expect after the procedure? After the procedure, it is common to have:  Itching.  Discomfort.  Fluid discharge.  Sensitivity to light and to  touch.  Bruising in or around the eye.  Mild blurred vision. Follow these instructions at home: Eye care   Do not touch or rub your eyes.  Protect your eyes as told by your health care provider. You may be told to wear a protective eye shield or sunglasses.  Do not put a contact lens into the affected eye or eyes until your health care provider approves.  Keep the area around your eye clean and dry: ? Avoid swimming. ? Do not allow water to hit you directly in the face while showering. ? Keep soap and shampoo out of your eyes.  Check your eye every day for signs of infection. Watch for: ? Redness, swelling, or pain. ? Fluid, blood, or pus. ? Warmth. ? A bad smell. ? Vision that is getting worse. ? Sensitivity that is getting worse. Activity  Do not drive for 24 hours if you were given a sedative during your procedure.  Avoid strenuous activities, such as playing contact sports, for as long as told by your health care provider.  Do not drive or use heavy machinery until your health care provider approves.  Do not bend or lift heavy objects. Bending increases pressure in the eye. You can walk, climb stairs, and do light household chores.  Ask your health care provider when  you can return to work. If you work in a dusty environment, you may be advised to wear protective eyewear for a period of time. General instructions  Take or apply over-the-counter and prescription medicines only as told by your health care provider. This includes eye drops.  Keep all follow-up visits as told by your health care provider. This is important. Contact a health care provider if:  You have increased bruising around your eye.  You have pain that is not helped with medicine.  You have a fever.  You have redness, swelling, or pain in your eye.  You have fluid, blood, or pus coming from your incision.  Your vision gets worse.  Your sensitivity to light gets worse. Get help right away  if:  You have sudden loss of vision.  You see flashes of light or spots (floaters).  You have severe eye pain.  You develop nausea or vomiting. Summary  After your procedure, it is common to have itching, discomfort, bruising, fluid discharge, or sensitivity to light.  Follow instructions from your health care provider about caring for your eye after the procedure.  Do not rub your eye after the procedure. You may need to wear eye protection or sunglasses. Do not wear contact lenses. Keep the area around your eye clean and dry.  Avoid activities that require a lot of effort. These include playing sports and lifting heavy objects.  Contact a health care provider if you have increased bruising, pain that does not go away, or a fever. Get help right away if you suddenly lose your vision, see flashes of light or spots, or have severe pain in the eye. This information is not intended to replace advice given to you by your health care provider. Make sure you discuss any questions you have with your health care provider. Document Revised: 01/19/2019 Document Reviewed: 09/22/2017 Elsevier Patient Education  2020 [place] After These instructions provide you with information about caring for yourself after your procedure. Your health care provider may also give you more specific instructions. Your treatment has been planned according to current medical practices, but problems sometimes occur. Call your health care provider if you have any problems or questions after your procedure. What can I expect after the procedure? After your procedure, you may:  Feel sleepy for several hours.  Feel clumsy and have poor balance for several hours.  Feel forgetful about what happened after the procedure.  Have poor judgment for several hours.  Feel nauseous or vomit.  Have a sore throat if you had a breathing tube during the procedure. Follow these instructions  at home: For at least 24 hours after the procedure:      Have a responsible adult stay with you. It is important to have someone help care for you until you are awake and alert.  Rest as needed.  Do not: ? Participate in activities in which you could fall or become injured. ? Drive. ? Use heavy machinery. ? Drink alcohol. ? Take sleeping pills or medicines that cause drowsiness. ? Make important decisions or sign legal documents. ? Take care of children on your own. Eating and drinking  Follow the diet that is recommended by your health care provider.  If you vomit, drink water, juice, or soup when you can drink without vomiting.  Make sure you have little or no nausea before eating solid foods. General instructions  Take over-the-counter and prescription medicines only as told by your health  care provider.  If you have sleep apnea, surgery and certain medicines can increase your risk for breathing problems. Follow instructions from your health care provider about wearing your sleep device: ? Anytime you are sleeping, including during daytime naps. ? While taking prescription pain medicines, sleeping medicines, or medicines that make you drowsy.  If you smoke, do not smoke without supervision.  Keep all follow-up visits as told by your health care provider. This is important. Contact a health care provider if:  You keep feeling nauseous or you keep vomiting.  You feel light-headed.  You develop a rash.  You have a fever. Get help right away if:  You have trouble breathing. Summary  For several hours after your procedure, you may feel sleepy and have poor judgment.  Have a responsible adult stay with you for at least 24 hours or until you are awake and alert. This information is not intended to replace advice given to you by your health care provider. Make sure you discuss any questions you have with your health care provider. Document Revised: 06/23/2017 Document  Reviewed: 07/16/2015 Elsevier Patient Education  [place].

## 2019-07-15 ENCOUNTER — Encounter (HOSPITAL_COMMUNITY)
Admission: RE | Admit: 2019-07-15 | Discharge: 2019-07-15 | Disposition: A | Discharge status: Home / Self Care | Payer: Medicare Other | Source: Ambulatory Visit | Attending: Ophthalmology | Admitting: Ophthalmology

## 2019-07-15 ENCOUNTER — Other Ambulatory Visit (HOSPITAL_COMMUNITY)
Admission: RE | Admit: 2019-07-15 | Discharge: 2019-07-15 | Disposition: A | Discharge status: Home / Self Care | Payer: Medicare Other | Source: Ambulatory Visit | Attending: Ophthalmology | Admitting: Ophthalmology

## 2019-08-04 NOTE — H&P (Signed)
Surgical History & Physical  Patient Name: [name] DOB: Jun 25, 1934  Surgery: Cataract extraction with intraocular lens implant phacoemulsification; Right Eye  Surgeon: [name] MD Surgery Date:  08/13/2019 Pre-Op Date:  08/04/2019  HPI: A 4 Yr. old female patient 1. 1. The patient complains of difficulty when viewing TV, reading closed caption, news scrolls on TV, which began 1 year ago. The right eye is affected. The episode is gradual. The patient describes hazy symptoms affecting their eyes/vision. Pt is needing more light to read. Pt has had several falls and her depth perception is off. This is negatively affecting the patient's quality of life. Eyes are really dry and they water a lot. Pt is using tears. Last A1C was just over 7.0. Pt has ARMD and was getting injections OS. HPI Completed by Dr. [name]  Medical History: Cataract OD CE/IOL OS 5/14 Full thickness macular hole OD Mild NPDR OS H/O ERM OS ERM OU Arthritis Diabetes - DM Type 2 GERD Anxiety Fibromyalgia H/O migraine headach... High Blood Pressure  Review of Systems Negative Allergic/Immunologic Negative Cardiovascular Negative Constitutional Negative Ear, Nose, Mouth & Throat Negative Endocrine Negative Eyes Negative Gastrointestinal Negative Genitourinary Negative Hemotologic/Lymphatic Negative Integumentary Negative Musculoskeletal Negative Neurological Negative Psychiatry Negative Respiratory  Social   Never Smoked  Medication Amlodipine Besylate, Hydrochlorothiazide, Nitroglycerin, Pantoprazole, Simvastatin, Lantus, Claritin, Apixaban, Metoprolol, Nystatin, Zofran, Proair respiClick, Victoza,   Sx/Procedures CE/IOL,  Brain Surgery,   Drug Allergies  Demoral, Iodine, Penicillins,   History & Physical: Heent:  Cataract, Right eye NECK: supple without bruits LUNGS: lungs clear to auscultation CV: regular rate and rhythm Abdomen: soft and non-tender   Impression &  Plan: Assessment: 1.  COMBINED FORMS AGE RELATED CATARACT; Right Eye (H25.811) 2.  PCO; Left Eye (H26.492) 3.  DM Type 2; Left Mod Without ME [id number] 4.  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  MACULAR CYST/HOLE/PSEUDOHOLE; Left Eye (H35.342) 6.  BLINDNESS LEFT EYE CATEGORY 5, NORMAL VISION RIGHT EYE (H54.42A5)  Plan: 1.  Cataract accounts for the patient's decreased vision. This visual impairment is not correctable with a tolerable change in glasses or contact lenses. Cataract surgery with an implantation of a new lens should significantly improve the visual and functional status of the patient. Discussed all risks, benefits, alternatives, and potential complications. Discussed the procedures and recovery. Patient desires to have surgery. A-scan ordered and performed today for intra-ocular lens calculations. The surgery will be performed in order to improve vision for driving, reading, and for eye examinations. Recommend phacoemulsification with intra-ocular lens. Right Eye. Dilates well - shugarcaine by protocol. 2.  Symptomatic and visually significant. Accounts for patient's symptoms and is negatively affecting the patient's quality of life. Reviewed risks of the Yag laser including IOP spike, retinal detachment, and chronic inflammation. Patient wishes to proceed. Schedule patient for YAG capsulotomy at 1 week PO visit for OD. 3.  Recommend good blood sugar control. 4.  Monitor. 5.  Stable. 6.  Monocular precautions discussed, including wearing shatterproof lenses.

## 2019-08-11 ENCOUNTER — Other Ambulatory Visit (HOSPITAL_COMMUNITY)
Admission: RE | Admit: 2019-08-11 | Discharge: 2019-08-11 | Disposition: A | Discharge status: Home / Self Care | Payer: Medicare Other | Source: Ambulatory Visit | Attending: Ophthalmology | Admitting: Ophthalmology

## 2019-08-11 ENCOUNTER — Encounter (HOSPITAL_COMMUNITY)
Admission: RE | Admit: 2019-08-11 | Discharge: 2019-08-11 | Disposition: A | Discharge status: Home / Self Care | Payer: Medicare Other | Source: Ambulatory Visit | Attending: Ophthalmology | Admitting: Ophthalmology

## 2019-08-11 ENCOUNTER — Encounter (HOSPITAL_COMMUNITY): Payer: Self-pay

## 2019-08-11 ENCOUNTER — Other Ambulatory Visit: Payer: Self-pay

## 2019-08-11 DIAGNOSIS — Z20822 Contact with and (suspected) exposure to covid-19: Secondary | ICD-10-CM | POA: Insufficient documentation

## 2019-08-11 DIAGNOSIS — Z01812 Encounter for preprocedural laboratory examination: Secondary | ICD-10-CM | POA: Diagnosis present

## 2019-08-12 LAB — SARS CORONAVIRUS 2 (TAT 6-24 HRS): SARS Coronavirus 2: NEGATIVE

## 2019-08-13 ENCOUNTER — Encounter (HOSPITAL_COMMUNITY): Payer: Self-pay | Admitting: Ophthalmology

## 2019-08-13 ENCOUNTER — Ambulatory Visit (HOSPITAL_COMMUNITY)
Admission: RE | Admit: 2019-08-13 | Discharge: 2019-08-13 | Disposition: A | Discharge status: Home / Self Care | Payer: Medicare Other | Attending: Ophthalmology | Admitting: Ophthalmology

## 2019-08-13 ENCOUNTER — Other Ambulatory Visit: Payer: Self-pay

## 2019-08-13 ENCOUNTER — Ambulatory Visit (HOSPITAL_COMMUNITY): Payer: Medicare Other | Admitting: Anesthesiology

## 2019-08-13 ENCOUNTER — Encounter (HOSPITAL_COMMUNITY)
Admission: RE | Disposition: A | Discharge status: Home / Self Care | Payer: Self-pay | Source: Home / Self Care | Attending: Ophthalmology

## 2019-08-13 ENCOUNTER — Other Ambulatory Visit: Payer: Self-pay | Admitting: Cardiology

## 2019-08-13 DIAGNOSIS — Z794 Long term (current) use of insulin: Secondary | ICD-10-CM | POA: Insufficient documentation

## 2019-08-13 DIAGNOSIS — M1990 Unspecified osteoarthritis, unspecified site: Secondary | ICD-10-CM | POA: Diagnosis not present

## 2019-08-13 DIAGNOSIS — H25811 Combined forms of age-related cataract, right eye: Secondary | ICD-10-CM | POA: Insufficient documentation

## 2019-08-13 DIAGNOSIS — G4730 Sleep apnea, unspecified: Secondary | ICD-10-CM | POA: Insufficient documentation

## 2019-08-13 DIAGNOSIS — Z7901 Long term (current) use of anticoagulants: Secondary | ICD-10-CM | POA: Insufficient documentation

## 2019-08-13 DIAGNOSIS — K219 Gastro-esophageal reflux disease without esophagitis: Secondary | ICD-10-CM | POA: Diagnosis not present

## 2019-08-13 DIAGNOSIS — F419 Anxiety disorder, unspecified: Secondary | ICD-10-CM | POA: Insufficient documentation

## 2019-08-13 DIAGNOSIS — E1136 Type 2 diabetes mellitus with diabetic cataract: Secondary | ICD-10-CM | POA: Insufficient documentation

## 2019-08-13 DIAGNOSIS — Z79899 Other long term (current) drug therapy: Secondary | ICD-10-CM | POA: Insufficient documentation

## 2019-08-13 DIAGNOSIS — J45909 Unspecified asthma, uncomplicated: Secondary | ICD-10-CM | POA: Insufficient documentation

## 2019-08-13 DIAGNOSIS — I10 Essential (primary) hypertension: Secondary | ICD-10-CM | POA: Insufficient documentation

## 2019-08-13 HISTORY — PX: CATARACT EXTRACTION W/PHACO: SHX586

## 2019-08-13 LAB — GLUCOSE, CAPILLARY: Glucose-Capillary: 106 mg/dL — ABNORMAL HIGH (ref 70–99)

## 2019-08-13 SURGERY — PHACOEMULSIFICATION, CATARACT, WITH IOL INSERTION
Anesthesia: Monitor Anesthesia Care | Site: Eye | Laterality: Right

## 2019-08-13 MED ORDER — PROVISC 10 MG/ML IO SOLN
INTRAOCULAR | Status: DC | PRN
  Administered 2019-08-13: 0.85 mL via INTRAOCULAR

## 2019-08-13 MED ORDER — LIDOCAINE HCL 3.5 % OP GEL
1.0000 "application " | Freq: Once | OPHTHALMIC | Status: AC
  Administered 2019-08-13: 1 via OPHTHALMIC

## 2019-08-13 MED ORDER — PHENYLEPHRINE HCL 2.5 % OP SOLN
1.0000 [drp] | OPHTHALMIC | Status: AC | PRN
  Administered 2019-08-13 (×3): 1 [drp] via OPHTHALMIC

## 2019-08-13 MED ORDER — LIDOCAINE 3.5 % OP GEL OPTIME - NO CHARGE
OPHTHALMIC | Status: DC | PRN
  Administered 2019-08-13: 09:00:00 1 [drp] via OPHTHALMIC

## 2019-08-13 MED ORDER — FENTANYL CITRATE (PF) 100 MCG/2ML IJ SOLN
INTRAMUSCULAR | Status: AC
  Filled 2019-08-13: qty 2

## 2019-08-13 MED ORDER — FENTANYL CITRATE (PF) 100 MCG/2ML IJ SOLN
INTRAMUSCULAR | Status: DC | PRN
  Administered 2019-08-13: 50 ug via INTRAVENOUS

## 2019-08-13 MED ORDER — LIDOCAINE HCL (PF) 1 % IJ SOLN
INTRAOCULAR | Status: DC | PRN
  Administered 2019-08-13: 09:00:00 1 mL via OPHTHALMIC

## 2019-08-13 MED ORDER — SODIUM HYALURONATE 23 MG/ML IO SOLN
INTRAOCULAR | Status: DC | PRN
  Administered 2019-08-13: 0.6 mL via INTRAOCULAR

## 2019-08-13 MED ORDER — NEOMYCIN-POLYMYXIN-DEXAMETH 3.5-10000-0.1 OP SUSP
OPHTHALMIC | Status: DC | PRN
  Administered 2019-08-13: 1 [drp] via OPHTHALMIC

## 2019-08-13 MED ORDER — BSS IO SOLN
INTRAOCULAR | Status: DC | PRN
  Administered 2019-08-13: 15 mL via INTRAOCULAR

## 2019-08-13 MED ORDER — TETRACAINE HCL 0.5 % OP SOLN
1.0000 [drp] | OPHTHALMIC | Status: AC | PRN
  Administered 2019-08-13 (×3): 1 [drp] via OPHTHALMIC

## 2019-08-13 MED ORDER — POVIDONE-IODINE 5 % OP SOLN
OPHTHALMIC | Status: DC | PRN
  Administered 2019-08-13: 1 via OPHTHALMIC

## 2019-08-13 MED ORDER — CYCLOPENTOLATE-PHENYLEPHRINE 0.2-1 % OP SOLN
1.0000 [drp] | OPHTHALMIC | Status: AC | PRN
  Administered 2019-08-13 (×3): 1 [drp] via OPHTHALMIC

## 2019-08-13 MED ORDER — EPINEPHRINE PF 1 MG/ML IJ SOLN
INTRAOCULAR | Status: DC | PRN
  Administered 2019-08-13: 09:00:00 500 mL

## 2019-08-13 SURGICAL SUPPLY — 12 items
CLOTH BEACON ORANGE TIMEOUT ST (SAFETY) ×1 IMPLANT
EYE SHIELD UNIVERSAL CLEAR (GAUZE/BANDAGES/DRESSINGS) ×1 IMPLANT
GLOVE BIOGEL PI IND STRL 7.0 (GLOVE) IMPLANT
GLOVE BIOGEL PI INDICATOR 7.0 (GLOVE) ×2
LENS ALC ACRYL/TECN (Ophthalmic Related) ×1 IMPLANT
NDL HYPO 18GX1.5 BLUNT FILL (NEEDLE) IMPLANT
NEEDLE HYPO 18GX1.5 BLUNT FILL (NEEDLE) ×2 IMPLANT
PAD ARMBOARD 7.5X6 YLW CONV (MISCELLANEOUS) ×1 IMPLANT
SYR TB 1ML LL NO SAFETY (SYRINGE) ×1 IMPLANT
TAPE PAPER 1X10 WHT MICROPORE (GAUZE/BANDAGES/DRESSINGS) ×1 IMPLANT
VISCOELASTIC ADDITIONAL (OPHTHALMIC RELATED) ×1 IMPLANT
WATER STERILE IRR 250ML POUR (IV SOLUTION) ×1 IMPLANT

## 2019-08-13 NOTE — Anesthesia Postprocedure Evaluation (Signed)
Anesthesia Post Note  Patient: [name]  Procedure(s) Performed: CATARACT EXTRACTION PHACO AND INTRAOCULAR LENS PLACEMENT (IOC) (Right Eye)  Patient location during evaluation: Phase II Anesthesia Type: MAC Level of consciousness: awake and alert Pain management: pain level controlled Vital Signs Assessment: post-procedure vital signs reviewed and stable Respiratory status: spontaneous breathing, nonlabored ventilation, respiratory function stable and patient connected to nasal cannula oxygen Cardiovascular status: stable and blood pressure returned to baseline Postop Assessment: no apparent nausea or vomiting Anesthetic complications: no     Last Vitals:  Vitals:   08/13/19 0815  BP: 109/81  Pulse: (!) 51  Resp: 18  Temp: 36.6 C  SpO2: 97%    Last Pain:  Vitals:   08/13/19 0815  TempSrc: Oral  PainSc: 5                  Juanjose Mojica

## 2019-08-13 NOTE — Op Note (Signed)
Date of procedure: 08/13/19  Pre-operative diagnosis:  Visually significant combined form age-related cataract, Right Eye (H25.811)  Post-operative diagnosis:  Visually significant combined form age-related cataract, Right Eye (H25.811)  Procedure: Removal of cataract via phacoemulsification and insertion of intra-ocular lens Wynetta Emery and [employer] DCB00  +24.5D into the capsular bag of the Right Eye  Attending surgeon: Gerda Diss. Elma Shands, MD, MA  Anesthesia: MAC, Topical Ak[place]  Complications: None  Estimated Blood Loss: <82m (minimal)  Specimens: None  Implants: As above  Indications:  Visually significant age-related cataract, Right Eye  Procedure:  The patient was seen and identified in the pre-operative area. The operative eye was identified and dilated.  The operative eye was marked.  Topical anesthesia was administered to the operative eye.     The patient was then to the operative suite and placed in the supine position.  A timeout was performed confirming the patient, procedure to be performed, and all other relevant information.   The patient's face was prepped and draped in the usual fashion for intra-ocular surgery.  A lid speculum was placed into the operative eye and the surgical microscope moved into place and focused.  A superotemporal paracentesis was created using a 20 gauge paracentesis blade.  Shugarcaine was injected into the anterior chamber.  Viscoelastic was injected into the anterior chamber.  A temporal clear-corneal main wound incision was created using a 2.414mmicrokeratome.  A continuous curvilinear capsulorrhexis was initiated using an irrigating cystitome and completed using capsulorrhexis forceps.  Hydrodissection and hydrodeliniation were performed.  Viscoelastic was injected into the anterior chamber.  A phacoemulsification handpiece and a chopper as a second instrument were used to remove the nucleus and epinucleus. The irrigation/aspiration handpiece was  used to remove any remaining cortical material.   The capsular bag was reinflated with viscoelastic, checked, and found to be intact.  The intraocular lens was inserted into the capsular bag.  The irrigation/aspiration handpiece was used to remove any remaining viscoelastic.  The clear corneal wound and paracentesis wounds were then hydrated and checked with Weck-Cels to be watertight.  The lid-speculum was removed.  The drape was removed.  The patient's face was cleaned with a wet and dry 4x4.   Maxitrol was instilled in the eye before a clear shield was taped over the eye. The patient was taken to the post-operative care unit in good condition, having tolerated the procedure well.  Post-Op Instructions: [facility] and will receive all other orders and instructions.

## 2019-08-13 NOTE — Addendum Note (Signed)
Addendum  created 08/13/19 1346 by Vista Deck, CRNA   Charge Capture section accepted

## 2019-08-13 NOTE — Addendum Note (Signed)
Addendum  created 08/13/19 1201 by Vista Deck, CRNA   Flowsheet accepted, Intraprocedure Event edited, Intraprocedure Flowsheets edited, Intraprocedure Staff edited

## 2019-08-13 NOTE — Interval H&P Note (Signed)
History and Physical Interval Note: The H and P was reviewed and updated. The patient was examined.  No changes were found after exam.  The surgical eye was marked.  08/13/2019 8:47 AM  [name]  has presented today for surgery, with the diagnosis of Nuclear sclerotic cataract - Right eye.  The various methods of treatment have been discussed with the patient and family. After consideration of risks, benefits and other options for treatment, the patient has consented to  Procedure(s) with comments: CATARACT EXTRACTION PHACO AND INTRAOCULAR LENS PLACEMENT (IOC) (Right) - right as a surgical intervention.  The patient's history has been reviewed, patient examined, no change in status, stable for surgery.  I have reviewed the patient's chart and labs.  Questions were answered to the patient's satisfaction.     [name]

## 2019-08-13 NOTE — Discharge Instructions (Signed)
Please discharge patient when stable, will follow up today with Dr. Sadie Hazelett at the [facility] office immediately following discharge.  Leave shield in place until visit.  All paperwork with discharge instructions will be given at the office.  [facility] Address:  [address]  [place], [place] [address]  

## 2019-08-13 NOTE — Anesthesia Preprocedure Evaluation (Signed)
Anesthesia Evaluation  Patient identified by MRN, date of birth, ID band Patient awake    Reviewed: Allergy & Precautions, H&P , NPO status , Patient's Chart, lab work & pertinent test results, reviewed documented beta blocker date and time   Airway Mallampati: II  TM Distance: >3 FB Neck ROM: full    Dental no notable dental hx. (+) Missing   Pulmonary asthma , sleep apnea ,    Pulmonary exam normal breath sounds clear to auscultation       Cardiovascular Exercise Tolerance: Good hypertension, negative cardio ROS   Rhythm:regular Rate:Normal     Neuro/Psych PSYCHIATRIC DISORDERS Anxiety  Neuromuscular disease    GI/Hepatic Neg liver ROS, hiatal hernia, GERD  Medicated,  Endo/Other  negative endocrine ROSdiabetes  Renal/GU CRFRenal disease  negative genitourinary   Musculoskeletal   Abdominal   Peds  Hematology negative hematology ROS (+)   Anesthesia Other Findings   Reproductive/Obstetrics negative OB ROS                             Anesthesia Physical Anesthesia Plan  ASA: III  Anesthesia Plan: MAC   Post-op Pain Management:    Induction:   PONV Risk Score and Plan:   Airway Management Planned:   Additional Equipment:   Intra-op Plan:   Post-operative Plan:   Informed Consent: I have reviewed the patients History and Physical, chart, labs and discussed the procedure including the risks, benefits and alternatives for the proposed anesthesia with the patient or authorized representative who has indicated his/her understanding and acceptance.     Dental Advisory Given  Plan Discussed with: CRNA  Anesthesia Plan Comments:         Anesthesia Quick Evaluation

## 2019-08-13 NOTE — Transfer of Care (Signed)
Immediate Anesthesia Transfer of Care Note  Patient: [name]  Procedure(s) Performed: CATARACT EXTRACTION PHACO AND INTRAOCULAR LENS PLACEMENT (IOC) (Right Eye)  Patient Location: PACU  Anesthesia Type:MAC  Level of Consciousness: awake, alert , oriented and patient cooperative  Airway & Oxygen Therapy: Patient Spontanous Breathing  Post-op Assessment: Report given to RN, Post -op Vital signs reviewed and stable and Patient moving all extremities X 4  Post vital signs: Reviewed and stable  Last Vitals:  Vitals Value Taken Time  BP    Temp    Pulse    Resp    SpO2      Last Pain:  Vitals:   08/13/19 0815  TempSrc: Oral  PainSc: 5       Patients Stated Pain Goal: 8 (05/08/41 8887)  Complications: No apparent anesthesia complications

## 2019-09-02 ENCOUNTER — Other Ambulatory Visit: Payer: Self-pay | Admitting: Cardiology

## 2019-09-07 ENCOUNTER — Other Ambulatory Visit: Payer: Self-pay | Admitting: "Endocrinology

## 2019-09-30 LAB — LIPID PANEL
Cholesterol: 123 (ref 0–200)
HDL: 49 (ref 35–70)
LDL Cholesterol: 56
Triglycerides: 88 (ref 40–160)

## 2019-09-30 LAB — HEMOGLOBIN A1C: Hemoglobin A1C: 8.2

## 2019-09-30 LAB — BASIC METABOLIC PANEL: Creatinine: 1.9 — AB (ref 0.5–1.1)

## 2019-10-03 ENCOUNTER — Other Ambulatory Visit: Payer: Self-pay | Admitting: Cardiology

## 2019-10-16 LAB — COMPLETE METABOLIC PANEL WITH GFR
AG Ratio: 1.5 (calc) (ref 1.0–2.5)
ALT: 6 U/L (ref 6–29)
AST: 12 U/L (ref 10–35)
Albumin: 4.2 g/dL (ref 3.6–5.1)
Alkaline phosphatase (APISO): 74 U/L (ref 37–153)
BUN/Creatinine Ratio: 22 (calc) (ref 6–22)
BUN: 32 mg/dL — ABNORMAL HIGH (ref 7–25)
CO2: 26 mmol/L (ref 20–32)
Calcium: 9.5 mg/dL (ref 8.6–10.4)
Chloride: 105 mmol/L (ref 98–110)
Creat: 1.46 mg/dL — ABNORMAL HIGH (ref 0.60–0.88)
GFR, Est African American: 38 mL/min/{1.73_m2} — ABNORMAL LOW (ref >=60)
GFR, Est Non African American: 33 mL/min/{1.73_m2} — ABNORMAL LOW (ref >=60)
Globulin: 2.8 g/dL (calc) (ref 1.9–3.7)
Glucose, Bld: 155 mg/dL — ABNORMAL HIGH (ref 65–99)
Potassium: 4.6 mmol/L (ref 3.5–5.3)
Sodium: 140 mmol/L (ref 135–146)
Total Bilirubin: 0.4 mg/dL (ref 0.2–1.2)
Total Protein: 7 g/dL (ref 6.1–8.1)

## 2019-10-16 LAB — LIPID PANEL
Cholesterol: 123 mg/dL (ref <200)
HDL: 39 mg/dL — ABNORMAL LOW (ref >=50)
LDL Cholesterol (Calc): 66 mg/dL (calc)
Non-HDL Cholesterol (Calc): 84 mg/dL (calc) (ref <130)
Total CHOL/HDL Ratio: 3.2 (calc) (ref <5.0)
Triglycerides: 96 mg/dL (ref <150)

## 2019-10-16 LAB — VITAMIN D 25 HYDROXY (VIT D DEFICIENCY, FRACTURES): Vit D, 25-Hydroxy: 62 ng/mL (ref 30–100)

## 2019-10-16 LAB — MICROALBUMIN / CREATININE URINE RATIO
Creatinine, Urine: 75 mg/dL (ref 20–275)
Microalb Creat Ratio: 555 mcg/mg creat — ABNORMAL HIGH (ref <30)
Microalb, Ur: 41.6 mg/dL

## 2019-10-16 LAB — TSH: TSH: 2.92 mIU/L (ref 0.40–4.50)

## 2019-10-16 LAB — T4, FREE: Free T4: 1.3 ng/dL (ref 0.8–1.8)

## 2019-10-18 ENCOUNTER — Other Ambulatory Visit: Payer: Self-pay | Admitting: "Endocrinology

## 2019-10-19 ENCOUNTER — Ambulatory Visit: Payer: Medicare Other | Admitting: Gastroenterology

## 2019-10-20 ENCOUNTER — Other Ambulatory Visit: Payer: Self-pay

## 2019-10-20 ENCOUNTER — Encounter: Payer: Self-pay | Admitting: "Endocrinology

## 2019-10-20 ENCOUNTER — Ambulatory Visit (INDEPENDENT_AMBULATORY_CARE_PROVIDER_SITE_OTHER): Payer: Medicare Other | Admitting: "Endocrinology

## 2019-10-20 VITALS — BP 126/77 | HR 65 | Ht 62.0 in | Wt 192.0 lb

## 2019-10-20 DIAGNOSIS — E1122 Type 2 diabetes mellitus with diabetic chronic kidney disease: Secondary | ICD-10-CM | POA: Diagnosis not present

## 2019-10-20 DIAGNOSIS — E782 Mixed hyperlipidemia: Secondary | ICD-10-CM

## 2019-10-20 DIAGNOSIS — N183 Chronic kidney disease, stage 3 unspecified: Secondary | ICD-10-CM

## 2019-10-20 DIAGNOSIS — I10 Essential (primary) hypertension: Secondary | ICD-10-CM | POA: Diagnosis not present

## 2019-10-20 MED ORDER — GLIPIZIDE ER 2.5 MG PO TB24: 2.5000 mg | ORAL_TABLET | Freq: Every day | ORAL | 1 refills | Status: DC
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
## 2019-10-20 NOTE — Patient Instructions (Signed)

## 2019-10-31 ENCOUNTER — Other Ambulatory Visit: Payer: Self-pay | Admitting: Nurse Practitioner

## 2019-10-31 NOTE — Progress Notes (Signed)
Cardiology Office Note  Date: 11/01/2019   ID: [name], [name] Aug 10, 1934, MRN [id number]  PCP:  Leeanne Rio, MD  Cardiologist:  [name], MD Electrophysiologist:  None   No chief complaint on file.   History of Present Illness: [name] is a 85 y.o. female here for follow-up for history of atrial fibrillation and hyperlipidemia.  Last encounter was with Dr. Domenic Polite November 30, 2018 via telehealth visit.  Patient stated she had been doing well since last visit.  Denied any progressive anginal or exertional symptoms.  Denied any significant palpitations or arrhythmias.  Stated she did have occasional brief palpitations that were not very frequent.  Admits she has some dizziness at times, but denies any syncopal or near syncopal episodes.  Patient stated sometimes she decreased her diuretic dose to half a pill due to frequent trips to the restroom and increased dizziness after taking the medication.  Patient had a recent cataract surgery with intraocular lens placement of her right eye on 08/13/2019 with ophthalmology.  Recent office visit with Dr. Dorris Fetch for her diabetes on 10/20/2019.  Her hemoglobin A1c had increased from 7.4% to 8.2%.  Discussion of patient being high risk for CAD, CVA, CKD, retinopathy and neuropathy were all discussed with the patient in detail by Dr. Dorris Fetch.  She was to continue her Lantus 24 units nightly.  She was holding Humalog for now.  She is advised to wear her continuous glucose monitor device at all time.  He he had a glipizide 2.5 mg XL at breakfast.  She was advised to lower her Victoza to 1.2 mg subcu daily.  She presents today with complaints of feeling fatigued all the time.  Otherwise no sensation of palpitations or arrhythmias, orthostatic symptoms, CVA or TIA-like symptoms, PND, orthopnea, bleeding issues, claudication, DVT or PE-like symptoms, or lower extremity edema.  She has a history of sleep apnea but has been unable to  tolerate CPAP.  Recently visited her endocrinologist and blood sugars have been out of control per her statement.  States Dr. Dorien Chihuahua adjusted some of her medications.  She recently had multiple labs which showed normal thyroid studies studies.  Globin A1c was 8.2%.  Vitamin D was 62.  Lipid panel showed total cholesterol 123, HDL 39, LDL 66.  Renal function showed creatinine 1.46 with GFR of 33.  EKG showed atrial fibrillation with a competing junctional pacemaker, rate of 66.  Past Medical History:  Diagnosis Date  . Anxiety   . Asthma   . Chronic back pain   . Colonoscopy causing post-procedural bleeding    Incomplete. by Dr. [name] 06/11/99 due to fixed non-compliant colon precluded exam to 40 cm, she had subsequent colectomy for diverticulitis since that time.   . Diarrhea   . Esophageal reflux   . Essential hypertension   . Fibromyalgia   . Hiatal hernia    Recent EGD/TCS showed small hiatal hernia, normal apperaring tubular esophagus. s/p passage of a 54-french Maloney dilator, s/p biopsy esophageal mucosa, multiple fundal gland type gastric polyps. one large pedunculated polp with oozing, noted s/p clipping and snare polypectomy. Biopsies of the gastric mucosa taken given her symptoms in her elevated count. normal D1-D3, s/p biospy D2-D3.   [name] Hiatal hernia    all bx unremarkable. on TCS she had left-sided diverticula, evidence of prior segmental resection with anastomosis, multiple colonic polyps s/p multiple snare polypectomies, s/p segmental biopsy and stool sampling. she had multiple tubular adenomas. no microscopic/collagenous colitis. stool  culture, c diff, O+P, lactoferrin were negative.   . Mixed hyperlipidemia   . Nephrolithiasis   . OSA (obstructive sleep apnea)   . Permanent atrial fibrillation (HCC)    Previous failed DCCV  . Type 2 diabetes mellitus ([place])   . Ureteral stent retained    Not retained. ureteral stent removal gross hematuria resolved 10/11. coumadin restarted.   .  Ventral hernia     Past Surgical History:  Procedure Laterality Date  . BREAST LUMPECTOMY     benign cyst  . CATARACT EXTRACTION W/PHACO Left 08/17/2012   Procedure: CATARACT EXTRACTION PHACO AND INTRAOCULAR LENS PLACEMENT (IOC);  Surgeon: [name], MD;  Location: [facility];  Service: Ophthalmology;  Laterality: Left;  CDE:18.73  . CATARACT EXTRACTION W/PHACO Right 08/13/2019   Procedure: CATARACT EXTRACTION PHACO AND INTRAOCULAR LENS PLACEMENT (IOC);  Surgeon: [name], MD;  Location: [facility];  Service: Ophthalmology;  Laterality: Right;  CDE: 7.14  . COLECTOMY     2005. Dr. Geoffry Paradise for diverticulitis  . COLONOSCOPY  02/22/09   normal/left-sided diverticula/multiple colonic polyp, adenomatous  . COLONOSCOPY  12/16/2011   colonic polyps-treated as described above. Status postprior sigmoid resection. adenomatous. next TCS 12/2014  . COLONOSCOPY N/A 12/28/2014   Status post prior segmental resection. Few residual colonic diverticula- status post segmental biopsy negative random colon biopsies  . ESOPHAGEAL DILATION N/A 12/28/2014   Procedure: ESOPHAGEAL DILATION;  Surgeon: [name], MD;  Location: [facility];  Service: Endoscopy;  Laterality: N/A;  . ESOPHAGOGASTRODUODENOSCOPY  02/22/09   normal s/p dilator;small HH/one large pedunculates polyp  s/p clipping, hyperplastic  . ESOPHAGOGASTRODUODENOSCOPY  12/16/2011   [name]: small hiatal hernia. Hyperplastic apperating polyps. Status post [name] dilation as described above.  . ESOPHAGOGASTRODUODENOSCOPY N/A 12/28/2014   RMR: Normal-appearing esophagus status post passage of a maloney dilator. Hiatal hernia. abnormal gastic mucosa of uncertain significance status post gastric biopsy with mild chronic gastritis, no H pylori.   . TONSILLECTOMY    . TOTAL ABDOMINAL HYSTERECTOMY      Current Outpatient Medications  Medication Sig Dispense Refill  . albuterol (VENTOLIN HFA) 108 (90 Base) MCG/ACT inhaler Inhale 2 puffs into the lungs every  6 (six) hours as needed for wheezing or shortness of breath.    [name] amLODipine (NORVASC) 5 MG tablet TAKE 1 TABLET BY MOUTH EVERY DAY 90 tablet 3  . apixaban (ELIQUIS) 5 MG TABS tablet Take 1 tablet (5 mg total) by mouth 2 (two) times daily. 180 tablet 3  . Continuous Blood Gluc Sensor (FREESTYLE LIBRE SENSOR SYSTEM) MISC Use one sensor every 10 days. 3 each 2  . furosemide (LASIX) 20 MG tablet TAKE 1 TABLET BY MOUTH EVERY DAY 90 tablet 1  . glipiZIDE (GLUCOTROL XL) 2.5 MG 24 hr tablet Take 1 tablet (2.5 mg total) by mouth daily with breakfast. 90 tablet 1  . GLOBAL EASE INJECT PEN NEEDLES 31G X 5 MM MISC USE ONE TIP WITH INSULIN AT BEDTIME 100 each 3  . Insulin Glargine (LANTUS SOLOSTAR) 100 UNIT/ML Solostar Pen Inject 24 Units into the skin at bedtime. 15 mL 2  . loratadine (CLARITIN) 10 MG tablet Take 10 mg by mouth daily.    . metoprolol tartrate (LOPRESSOR) 25 MG tablet TAKE 1 TABLET BY MOUTH TWICE DAILY 60 tablet 6  . nitroGLYCERIN (NITROSTAT) 0.4 MG SL tablet PLACE 1 TABLET UNDER THE TONGUE EVERY 5 MINUTES FOR 3 DOSES AS NEEDED FOR CHEST PAIN. IF YOU HAVE NO RELIEF AFTER THE 3RD DOSE PROCEED TO  THE ED FOR AN EVALUATION 90 tablet 3  . ondansetron (ZOFRAN-ODT) 4 MG disintegrating tablet Take 4 mg by mouth every 8 (eight) hours as needed for nausea or vomiting.     . pantoprazole (PROTONIX) 40 MG tablet TAKE 1 TABLET BY MOUTH TWICE DAILY 60 tablet 5  . simvastatin (ZOCOR) 20 MG tablet Take 20 mg by mouth daily.     [name] VICTOZA 18 MG/3ML SOPN INJECT 0.2 MLS (1.2 MG TOTAL) INTO THE SKIN DAILY 9 mL 0   No current facility-administered medications for this visit.   Allergies:  Adhesive [tape], Demerol [meperidine], Iohexol, Meperidine hcl, Shellfish allergy, and Penicillins   Social History: The patient  reports that she has never smoked. She has never used smokeless tobacco. She reports that she does not drink alcohol and does not use drugs.   Family History: The patient's family history includes  Colon cancer in her maternal grandfather; Coronary artery disease in an other family member; Depression in her son; Diabetes in her father and mother; Heart attack in her father and mother; Heart disease in her sister; Hernia in her son; Hypertension in her father and mother; Mental retardation in her brother; Pancreatitis in her son.   ROS:  Please see the history of present illness. Otherwise, complete review of systems is positive for none.  All other systems are reviewed and negative.   Physical Exam: VS:  BP (!) 158/66   Pulse 66   Ht 5\' 2"  (1.575 m)   Wt 195 lb 6.4 oz (88.6 kg)   SpO2 97%   BMI 35.74 kg/m , BMI Body mass index is 35.74 kg/m.  Wt Readings from Last 3 Encounters:  11/01/19 195 lb 6.4 oz (88.6 kg)  10/20/19 192 lb (87.1 kg)  08/13/19 172 lb (78 kg)    General: Patient appears comfortable at rest. Neck: Supple, no elevated JVP or carotid bruits, no thyromegaly. Lungs: Clear to auscultation, nonlabored breathing at rest. Cardiac: Irregularly irregular, no S3 or significant systolic murmur, no pericardial rub. Abdomen: Soft, nontender, no hepatomegaly, bowel sounds present, no guarding or rebound. Extremities: No pitting edema, distal pulses 2+. Skin: Warm and dry. Neuropsychiatric: Alert and oriented x3, affect grossly appropriate.  ECG:  An ECG dated 11/01/2019 was personally reviewed today and demonstrated:  Atrial fibrillation with a competing junctional pacemaker rate of 66, anteroseptal infarct, age undetermined.  Recent Labwork: 10/15/2019: ALT 6; AST 12; BUN 32; Creat 1.46; Potassium 4.6; Sodium 140; TSH 2.92     Component Value Date/Time   CHOL 123 10/15/2019 0828   TRIG 96 10/15/2019 0828   HDL 39 (L) 10/15/2019 0828   CHOLHDL 3.2 10/15/2019 0828   VLDL 35 07/11/2009 2342   LDLCALC 66 10/15/2019 0828    Other Studies Reviewed Today:  Echocardiogram 06/22/2018([facility]): Mildly dilated left ventricle with LVEF of 80%, normal right ventricular  contraction, mild left atrial enlargement, mild mitral regurgitation, trace aortic regurgitation, mild tricuspid regurgitation, trivial pericardial effusion.  Carotid Dopplers 06/22/2018([facility]): Bilateral ICA stenoses of less than 50% with antegrade vertebral flow.  Assessment and Plan:   1. Permanent atrial fibrillation (HCC) EKG today shows atrial fibrillation with a competing junctional pacemaker rate of 66, anteroseptal infarct, age undetermined.  Patient denies any recent palpitations or arrhythmias.  Continue Eliquis 5 mg tablets p.o. twice daily, metoprolol 25 mg p.o. twice daily. Get CBC and BMP.  2. Mixed hyperlipidemia Continue simvastatin 20 mg daily.  Recent lipid panel showed total cholesterol 123, HDL 39, LDL 66.  3. Essential hypertension  Blood pressure elevated today on arrival. Patient states her blood pressures at home are within normal limits usually high 277 systolic and diastolic in the sixties range.  States she was upset over her recent issue with her prescriptions sent when she was talking to nursing staff..  Continue amlodipine 5 mg daily.  Lasix 20 mg daily.   Medication Adjustments/Labs and Tests Ordered: Current medicines are reviewed at length with the patient today.  Concerns regarding medicines are outlined above.    Patient Instructions  Medication Instructions:   Your physician recommends that you continue on your current medications as directed. Please refer to the Current Medication list given to you today. *If you need a refill on your cardiac medications before your next appointment, please call your pharmacy*   Lab Work:  NONE If you have labs (blood work) drawn today and your tests are completely normal, you will receive your results only by: [name] MyChart Message (if you have MyChart) OR . A paper copy in the mail If you have any lab test that is abnormal or we need to change your treatment, we will call you to review the  results.   Testing/Procedures:  NONE   Follow-Up: At [facility], you and your health needs are our priority.  As part of our continuing mission to provide you with exceptional heart care, we have created designated Provider Care Teams.  These Care Teams include your primary Cardiologist (physician) and Advanced Practice Providers (APPs -  Physician Assistants and Nurse Practitioners) who all work together to provide you with the care you need, when you need it.  We recommend signing up for the patient portal called "MyChart".  Sign up information is provided on this After Visit Summary.  MyChart is used to connect with patients for Virtual Visits (Telemedicine).  Patients are able to view lab/test results, encounter notes, upcoming appointments, etc.  Non-urgent messages can be sent to your provider as well.   To learn more about what you can do with MyChart, go to [email or web address].    Your next appointment:     6 months  The format for your next appointment:    In Person  Provider:   You may see [name], MD or the following Advanced Practice Provider on your designated Care Team:    Katina Dung, NP            Signed, Levell July, NP 11/01/2019 11:24 AM    [facility] [address], [place], [place] [address] Phone: [phone]; Fax: [phone]

## 2019-11-01 ENCOUNTER — Other Ambulatory Visit: Payer: Self-pay

## 2019-11-01 ENCOUNTER — Encounter: Payer: Self-pay | Admitting: Family Medicine

## 2019-11-01 ENCOUNTER — Ambulatory Visit (INDEPENDENT_AMBULATORY_CARE_PROVIDER_SITE_OTHER): Payer: Medicare Other | Admitting: Family Medicine

## 2019-11-01 VITALS — BP 158/66 | HR 66 | Ht 62.0 in | Wt 195.4 lb

## 2019-11-01 DIAGNOSIS — I4821 Permanent atrial fibrillation: Secondary | ICD-10-CM | POA: Diagnosis not present

## 2019-11-01 MED ORDER — APIXABAN 5 MG PO TABS: 5.0000 mg | ORAL_TABLET | Freq: Two times a day (BID) | ORAL | 3 refills | Status: DC

## 2019-11-01 NOTE — Patient Instructions (Addendum)
Medication Instructions:   Your physician recommends that you continue on your current medications as directed. Please refer to the Current Medication list given to you today. *If you need a refill on your cardiac medications before your next appointment, please call your pharmacy*   Lab Work:  NONE If you have labs (blood work) drawn today and your tests are completely normal, you will receive your results only by: [name] MyChart Message (if you have MyChart) OR . A paper copy in the mail If you have any lab test that is abnormal or we need to change your treatment, we will call you to review the results.   Testing/Procedures:  NONE   Follow-Up: At [facility], you and your health needs are our priority.  As part of our continuing mission to provide you with exceptional heart care, we have created designated Provider Care Teams.  These Care Teams include your primary Cardiologist (physician) and Advanced Practice Providers (APPs -  Physician Assistants and Nurse Practitioners) who all work together to provide you with the care you need, when you need it.  We recommend signing up for the patient portal called "MyChart".  Sign up information is provided on this After Visit Summary.  MyChart is used to connect with patients for Virtual Visits (Telemedicine).  Patients are able to view lab/test results, encounter notes, upcoming appointments, etc.  Non-urgent messages can be sent to your provider as well.   To learn more about what you can do with MyChart, go to [email or web address].    Your next appointment:     6 months  The format for your next appointment:    In Person  Provider:   You may see [name], MD or the following Advanced Practice Provider on your designated Care Team:    Katina Dung, NP

## 2019-11-26 ENCOUNTER — Ambulatory Visit (INDEPENDENT_AMBULATORY_CARE_PROVIDER_SITE_OTHER): Payer: Medicare Other | Admitting: Gastroenterology

## 2019-11-26 ENCOUNTER — Encounter: Payer: Self-pay | Admitting: Gastroenterology

## 2019-11-26 ENCOUNTER — Other Ambulatory Visit: Payer: Self-pay

## 2019-11-26 VITALS — BP 137/84 | HR 99 | Temp 97.8°F | Ht 62.0 in | Wt 196.8 lb

## 2019-11-26 DIAGNOSIS — K219 Gastro-esophageal reflux disease without esophagitis: Secondary | ICD-10-CM

## 2019-11-26 DIAGNOSIS — R198 Other specified symptoms and signs involving the digestive system and abdomen: Secondary | ICD-10-CM
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
## 2019-11-26 NOTE — Patient Instructions (Addendum)
Continue Protonix twice a day, 30 minutes before breakfast and dinner.  We will see you in 6 months or sooner if needed!  I enjoyed seeing you again today! As you know, I value our relationship and want to provide genuine, compassionate, and quality care. I welcome your feedback. If you receive a survey regarding your visit,  I greatly appreciate you taking time to fill this out. See you next time!  Annitta Needs, PhD, ANP-BC [facility]

## 2019-12-02 ENCOUNTER — Other Ambulatory Visit: Payer: Self-pay | Admitting: Cardiology

## 2020-01-03 ENCOUNTER — Other Ambulatory Visit: Payer: Self-pay | Admitting: "Endocrinology

## 2020-01-11 ENCOUNTER — Other Ambulatory Visit: Payer: Self-pay | Admitting: "Endocrinology

## 2020-01-31 NOTE — Progress Notes (Signed)
Cardiology Office Note  Date: 01/31/2020   ID: [name], [name] 1935/03/03, MRN [id number]  PCP:  Leeanne Rio, MD  Cardiologist:  [name], MD Electrophysiologist:  None   No chief complaint on file.   History of Present Illness: [name] is a 85 y.o. female here for follow-up for history of atrial fibrillation and hyperlipidemia.  Last encounter was with Dr. Domenic Polite November 30, 2018 via telehealth visit.  Patient stated she had been doing well since prior visit.  Denied any progressive anginal or exertional symptoms.  Denied any significant palpitations or arrhythmias.  Stated she did have occasional brief palpitations that were not very frequent.  Admitted she had some dizziness at times, but denies any syncopal or near syncopal episodes.  Patient stated sometimes she decreased her diuretic dose to half a pill due to frequent trips to the restroom and increased dizziness after taking the medication.  Patient had a recent cataract surgery with intraocular lens placement of her right eye on 08/13/2019 with ophthalmology.  Recent office visit with Dr. Dorris Fetch for her diabetes on 10/20/2019.  Her hemoglobin A1c had increased from 7.4% to 8.2%.  Discussion of patient being high risk for CAD, CVA, CKD, retinopathy and neuropathy were all discussed with the patient in detail by Dr. Dorris Fetch.  She was to continue her Lantus 24 units nightly.  She was holding Humalog for now.  She is advised to wear her continuous glucose monitor device at all time.  He he had a glipizide 2.5 mg XL at breakfast.  She was advised to lower her Victoza to 1.2 mg subcu daily.   Resented last visit with complaints of feeling fatigued all the time.  Otherwise no sensation of palpitations or arrhythmias, orthostatic symptoms, CVA or TIA-like symptoms, PND, orthopnea, bleeding issues, claudication, DVT or PE-like symptoms, or lower extremity edema.  She has a history of sleep apnea but has been unable  to tolerate CPAP.  Recently visited her endocrinologist and blood sugars have been out of control per her statement.  States Dr. Dorris Fetch adjusted some of her medications.  She recently had multiple labs which showed normal thyroid studies studies.  Hemoglobin A1c was 8.2%.  Vitamin D was 62.  Lipid panel showed total cholesterol 123, HDL 39, LDL 66.  Renal function showed creatinine 1.46 with GFR of 33.  EKG showed atrial fibrillation with a competing junctional pacemaker, rate of 66.   Recent admission to [facility] 01/26/2020 for iron deficiency anemia due to chronic blood loss.  Directly admitted from oncology office secondary to chronic iron deficiency anemia.  She received 2 units of packed red blood cells.  Hemoglobin was 9.1 afterward.  Initial hemoglobin was 6.9.  Cardiologist at [facility] recommended continued low-dose anticoagulation with Eliquis at 2.5 mg p.o. twice daily and follow-up with primary cardiologist.  To continue metoprolol for rate control but increasing dose to 50 mg po daily.    She is here today status post hospital follow-up.  Has no particular complaints.  Denies any anginal or exertional symptoms, palpitations or arrhythmias, orthostatic symptoms.  Denies any evidence of bleeding since discharge.  Her Eliquis was decreased to 2.5 mg p.o. twice daily at discharge.  Her metoprolol was increased to 50 mg p.o. twice daily due to elevated heart rate prior to discharge of 105.  Heart rate today 72 and regular pulse.  She denies any current palpitations or arrhythmias.  Blood pressure is elevated.  However patient states she  was a without her blood pressure medications as well as other medications for a few days before they were restarted.    Past Medical History:  Diagnosis Date  . Anxiety   . Asthma   . Chronic back pain   . Colonoscopy causing post-procedural bleeding    Incomplete. by Dr. [name] 06/11/99 due to fixed non-compliant colon precluded exam to 40 cm, she had  subsequent colectomy for diverticulitis since that time.   . Diarrhea   . Esophageal reflux   . Essential hypertension   . Fibromyalgia   . Hiatal hernia    Recent EGD/TCS showed small hiatal hernia, normal apperaring tubular esophagus. s/p passage of a 54-french Maloney dilator, s/p biopsy esophageal mucosa, multiple fundal gland type gastric polyps. one large pedunculated polp with oozing, noted s/p clipping and snare polypectomy. Biopsies of the gastric mucosa taken given her symptoms in her elevated count. normal D1-D3, s/p biospy D2-D3.   [name] Hiatal hernia    all bx unremarkable. on TCS she had left-sided diverticula, evidence of prior segmental resection with anastomosis, multiple colonic polyps s/p multiple snare polypectomies, s/p segmental biopsy and stool sampling. she had multiple tubular adenomas. no microscopic/collagenous colitis. stool culture, c diff, O+P, lactoferrin were negative.   . Mixed hyperlipidemia   . Nephrolithiasis   . OSA (obstructive sleep apnea)   . Permanent atrial fibrillation (HCC)    Previous failed DCCV  . Type 2 diabetes mellitus ([place])   . Ureteral stent retained    Not retained. ureteral stent removal gross hematuria resolved 10/11. coumadin restarted.   . Ventral hernia     Past Surgical History:  Procedure Laterality Date  . BREAST LUMPECTOMY     benign cyst  . CATARACT EXTRACTION W/PHACO Left 08/17/2012   Procedure: CATARACT EXTRACTION PHACO AND INTRAOCULAR LENS PLACEMENT (IOC);  Surgeon: [name], MD;  Location: [facility];  Service: Ophthalmology;  Laterality: Left;  CDE:18.73  . CATARACT EXTRACTION W/PHACO Right 08/13/2019   Procedure: CATARACT EXTRACTION PHACO AND INTRAOCULAR LENS PLACEMENT (IOC);  Surgeon: [name], MD;  Location: [facility];  Service: Ophthalmology;  Laterality: Right;  CDE: 7.14  . COLECTOMY     2005. Dr. Geoffry Paradise for diverticulitis  . COLONOSCOPY  02/22/09   normal/left-sided diverticula/multiple colonic polyp, adenomatous    . COLONOSCOPY  12/16/2011   colonic polyps-treated as described above. Status postprior sigmoid resection. adenomatous. next TCS 12/2014  . COLONOSCOPY N/A 12/28/2014   Status post prior segmental resection. Few residual colonic diverticula- status post segmental biopsy negative random colon biopsies  . ESOPHAGEAL DILATION N/A 12/28/2014   Procedure: ESOPHAGEAL DILATION;  Surgeon: [name], MD;  [facility];  Service: Endoscopy;  Laterality: N/A;  . ESOPHAGOGASTRODUODENOSCOPY  02/22/09   normal s/p dilator;small HH/one large pedunculates polyp  s/p clipping, hyperplastic  . ESOPHAGOGASTRODUODENOSCOPY  12/16/2011   [name]: small hiatal hernia. Hyperplastic apperating polyps. Status post [name] dilation as described above.  . ESOPHAGOGASTRODUODENOSCOPY N/A 12/28/2014   RMR: Normal-appearing esophagus status post passage of a maloney dilator. Hiatal hernia. abnormal gastic mucosa of uncertain significance status post gastric biopsy with mild chronic gastritis, no H pylori.   . TONSILLECTOMY    . TOTAL ABDOMINAL HYSTERECTOMY      Current Outpatient Medications  Medication Sig Dispense Refill  . albuterol (VENTOLIN HFA) 108 (90 Base) MCG/ACT inhaler Inhale 2 puffs into the lungs every 6 (six) hours as needed for wheezing or shortness of breath.    [name] amLODipine (NORVASC) 5 MG tablet  TAKE 1 TABLET BY MOUTH EVERY DAY 90 tablet 3  . apixaban (ELIQUIS) 5 MG TABS tablet Take 1 tablet (5 mg total) by mouth 2 (two) times daily. 180 tablet 3  . Continuous Blood Gluc Sensor (FREESTYLE LIBRE SENSOR SYSTEM) MISC Use one sensor every 10 days. 3 each 2  . furosemide (LASIX) 20 MG tablet TAKE 1 TABLET BY MOUTH EVERY DAY 90 tablet 1  . glipiZIDE (GLUCOTROL XL) 2.5 MG 24 hr tablet Take 1 tablet (2.5 mg total) by mouth daily with breakfast. 90 tablet 1  . GLOBAL EASE INJECT PEN NEEDLES 31G X 5 MM MISC USE ONE TIP WITH INSULIN AT BEDTIME 100 each 3  . LANTUS SOLOSTAR 100 UNIT/ML Solostar Pen INJECT 24  UNITS INTO THE SKIN AT BEDTIME 15 mL 2  . loratadine (CLARITIN) 10 MG tablet Take 10 mg by mouth daily.    . metoprolol tartrate (LOPRESSOR) 25 MG tablet TAKE 1 TABLET BY MOUTH TWICE DAILY 180 tablet 3  . nitroGLYCERIN (NITROSTAT) 0.4 MG SL tablet PLACE 1 TABLET UNDER THE TONGUE EVERY 5 MINUTES FOR 3 DOSES AS NEEDED FOR CHEST PAIN. IF YOU HAVE NO RELIEF AFTER THE 3RD DOSE PROCEED TO THE ED FOR AN EVALUATION 90 tablet 3  . ondansetron (ZOFRAN-ODT) 4 MG disintegrating tablet Take 4 mg by mouth every 8 (eight) hours as needed for nausea or vomiting.     . pantoprazole (PROTONIX) 40 MG tablet TAKE 1 TABLET BY MOUTH TWICE DAILY 60 tablet 5  . simvastatin (ZOCOR) 20 MG tablet Take 20 mg by mouth daily.     [name] VICTOZA 18 MG/3ML SOPN INJECT 0.2 MLS (1.2 MG TOTAL) INTO THE SKIN DAILY 9 mL 0   No current facility-administered medications for this visit.   Allergies:  Adhesive [tape], Demerol [meperidine], Iohexol, Meperidine hcl, Shellfish allergy, and Penicillins   Social History: The patient  reports that she has never smoked. She has never used smokeless tobacco. She reports that she does not drink alcohol and does not use drugs.   Family History: The patient's family history includes Colon cancer in her maternal grandfather; Coronary artery disease in an other family member; Depression in her son; Diabetes in her father and mother; Heart attack in her father and mother; Heart disease in her sister; Hernia in her son; Hypertension in her father and mother; Mental retardation in her brother; Pancreatitis in her son.   ROS:  Please see the history of present illness. Otherwise, complete review of systems is positive for none.  All other systems are reviewed and negative.   Physical Exam: VS:  There were no vitals taken for this visit., BMI There is no height or weight on file to calculate BMI.  Wt Readings from Last 3 Encounters:  11/26/19 196 lb 12.8 oz (89.3 kg)  11/01/19 195 lb 6.4 oz (88.6 kg)    10/20/19 192 lb (87.1 kg)    General: Patient appears comfortable at rest. Neck: Supple, no elevated JVP or carotid bruits, no thyromegaly. Lungs: Clear to auscultation, nonlabored breathing at rest. Cardiac: Regular rhythm and rate, no S3 or significant systolic murmur, no pericardial rub. Abdomen: Soft, nontender, no hepatomegaly, bowel sounds present, no guarding or rebound. Extremities: No pitting edema, distal pulses 2+. Skin: Warm and dry. Neuropsychiatric: Alert and oriented x3, affect grossly appropriate.  ECG:  An ECG dated 11/01/2019 was personally reviewed today and demonstrated:  Atrial fibrillation with a competing junctional pacemaker rate of 66, anteroseptal infarct, age undetermined.  Recent Labwork: 10/15/2019:  ALT 6; AST 12; BUN 32; Creat 1.46; Potassium 4.6; Sodium 140; TSH 2.92     Component Value Date/Time   CHOL 123 10/15/2019 0828   TRIG 96 10/15/2019 0828   HDL 39 (L) 10/15/2019 0828   CHOLHDL 3.2 10/15/2019 0828   VLDL 35 07/11/2009 2342   LDLCALC 66 10/15/2019 0828    Other Studies Reviewed Today:  Echocardiogram 06/22/2018([facility]): Mildly dilated left ventricle with LVEF of 80%, normal right ventricular contraction, mild left atrial enlargement, mild mitral regurgitation, trace aortic regurgitation, mild tricuspid regurgitation, trivial pericardial effusion.  Carotid Dopplers 06/22/2018([facility]): Bilateral ICA stenoses of less than 50% with antegrade vertebral flow.  Assessment and Plan:   1. Permanent atrial fibrillation (HCC) Pulses regular at 72.  Patient denies any recent palpitations or arrhythmias.  Continue Eliquis 2.5 mg p.o. twice daily.  Metoprolol 50 mg p.o. twice daily.  2. Mixed hyperlipidemia Continue simvastatin 20 mg daily.  Recent lipid panel showed total cholesterol 123, HDL 39, LDL 66.  3. Essential hypertension  Blood pressure elevated today on arrival at 158/80.  Patient states during her recent hospital stay she  was without her antihypertensive medications.  States her blood pressure is normally good at home.  Continue amlodipine 5 mg daily, furosemide 20 mg daily.  Advance patient to check her blood pressures at home and if sustained greater than 130/82 to lett [place] know.    Medication Adjustments/Labs and Tests Ordered: Current medicines are reviewed at length with the patient today.  Concerns regarding medicines are outlined above.    There are no Patient Instructions on file for this visit.       Signed, Levell July, NP 01/31/2020 4:21 PM    [facility] [place], [place], [place] [address] Phone: [phone]; Fax: [phone]

## 2020-02-01 ENCOUNTER — Ambulatory Visit (INDEPENDENT_AMBULATORY_CARE_PROVIDER_SITE_OTHER): Payer: Medicare Other | Admitting: Family Medicine

## 2020-02-01 ENCOUNTER — Encounter: Payer: Self-pay | Admitting: Family Medicine

## 2020-02-01 VITALS — BP 158/80 | HR 72 | Ht 62.0 in | Wt 196.0 lb

## 2020-02-01 DIAGNOSIS — I10 Essential (primary) hypertension: Secondary | ICD-10-CM

## 2020-02-01 DIAGNOSIS — I4821 Permanent atrial fibrillation: Secondary | ICD-10-CM | POA: Diagnosis not present

## 2020-02-01 DIAGNOSIS — E782 Mixed hyperlipidemia: Secondary | ICD-10-CM

## 2020-02-01 NOTE — Patient Instructions (Signed)
Medication Instructions:  Continue all current medications.   Labwork: none  Testing/Procedures: none  Follow-Up: 6 months   Any Other Special Instructions Will Be Listed Below (If Applicable).   If you need a refill on your cardiac medications before your next appointment, please call your pharmacy.  

## 2020-02-08 ENCOUNTER — Ambulatory Visit: Payer: Medicare Other | Admitting: Physician Assistant

## 2020-02-10 ENCOUNTER — Ambulatory Visit (INDEPENDENT_AMBULATORY_CARE_PROVIDER_SITE_OTHER): Payer: Medicare Other | Admitting: Gastroenterology

## 2020-02-29 ENCOUNTER — Other Ambulatory Visit: Payer: Self-pay | Admitting: Cardiology

## 2020-03-13 ENCOUNTER — Other Ambulatory Visit: Payer: Self-pay

## 2020-03-13 DIAGNOSIS — E1122 Type 2 diabetes mellitus with diabetic chronic kidney disease: Secondary | ICD-10-CM

## 2020-03-13 DIAGNOSIS — N183 Chronic kidney disease, stage 3 unspecified: Secondary | ICD-10-CM

## 2020-03-13 MED ORDER — VICTOZA 18 MG/3ML ~~LOC~~ SOPN: 1.2000 mg | PEN_INJECTOR | Freq: Every day | SUBCUTANEOUS | 0 refills | Status: DC

## 2020-03-21 ENCOUNTER — Ambulatory Visit: Payer: Medicare Other | Admitting: Gastroenterology

## 2020-03-30 ENCOUNTER — Other Ambulatory Visit: Payer: Self-pay | Admitting: "Endocrinology

## 2020-04-10 ENCOUNTER — Other Ambulatory Visit: Payer: Self-pay

## 2020-04-10 DIAGNOSIS — E1122 Type 2 diabetes mellitus with diabetic chronic kidney disease: Secondary | ICD-10-CM

## 2020-04-10 DIAGNOSIS — N183 Chronic kidney disease, stage 3 unspecified: Secondary | ICD-10-CM

## 2020-04-12 ENCOUNTER — Other Ambulatory Visit: Payer: Self-pay | Admitting: "Endocrinology

## 2020-04-12 DIAGNOSIS — E1122 Type 2 diabetes mellitus with diabetic chronic kidney disease: Secondary | ICD-10-CM

## 2020-04-12 DIAGNOSIS — N183 Chronic kidney disease, stage 3 unspecified: Secondary | ICD-10-CM

## 2020-04-24 ENCOUNTER — Ambulatory Visit: Payer: Medicare Other | Admitting: "Endocrinology

## 2020-04-28 LAB — COMPREHENSIVE METABOLIC PANEL
ALT: 9 IU/L (ref 0–32)
AST: 13 IU/L (ref 0–40)
Albumin/Globulin Ratio: 1.4 (ref 1.2–2.2)
Albumin: 4.5 g/dL (ref 3.6–4.6)
Alkaline Phosphatase: 94 IU/L (ref 44–121)
BUN/Creatinine Ratio: 17 (ref 12–28)
BUN: 27 mg/dL (ref 8–27)
Bilirubin Total: 0.5 mg/dL (ref 0.0–1.2)
CO2: 24 mmol/L (ref 20–29)
Calcium: 9.9 mg/dL (ref 8.7–10.3)
Chloride: 102 mmol/L (ref 96–106)
Creatinine, Ser: 1.61 mg/dL — ABNORMAL HIGH (ref 0.57–1.00)
GFR calc Af Amer: 34 mL/min/{1.73_m2} — ABNORMAL LOW (ref >59)
GFR calc non Af Amer: 29 mL/min/{1.73_m2} — ABNORMAL LOW (ref >59)
Globulin, Total: 3.3 g/dL (ref 1.5–4.5)
Glucose: 115 mg/dL — ABNORMAL HIGH (ref 65–99)
Potassium: 4.9 mmol/L (ref 3.5–5.2)
Sodium: 142 mmol/L (ref 134–144)
Total Protein: 7.8 g/dL (ref 6.0–8.5)

## 2020-04-30 ENCOUNTER — Other Ambulatory Visit: Payer: Self-pay | Admitting: Gastroenterology

## 2020-05-05 ENCOUNTER — Encounter: Payer: Self-pay | Admitting: "Endocrinology

## 2020-05-05 ENCOUNTER — Ambulatory Visit (INDEPENDENT_AMBULATORY_CARE_PROVIDER_SITE_OTHER): Payer: Medicare Other | Admitting: "Endocrinology

## 2020-05-05 ENCOUNTER — Other Ambulatory Visit: Payer: Self-pay

## 2020-05-05 VITALS — BP 128/78 | HR 64 | Ht 62.0 in | Wt 192.2 lb

## 2020-05-05 DIAGNOSIS — I10 Essential (primary) hypertension: Secondary | ICD-10-CM | POA: Diagnosis not present

## 2020-05-05 DIAGNOSIS — N183 Chronic kidney disease, stage 3 unspecified: Secondary | ICD-10-CM

## 2020-05-05 DIAGNOSIS — E1122 Type 2 diabetes mellitus with diabetic chronic kidney disease: Secondary | ICD-10-CM

## 2020-05-05 DIAGNOSIS — E782 Mixed hyperlipidemia: Secondary | ICD-10-CM

## 2020-05-05 LAB — POCT GLYCOSYLATED HEMOGLOBIN (HGB A1C): HbA1c, POC (controlled diabetic range): 7 % (ref 0.0–7.0)
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
## 2020-05-05 NOTE — Patient Instructions (Signed)

## 2020-05-16 ENCOUNTER — Other Ambulatory Visit (HOSPITAL_COMMUNITY): Payer: Self-pay | Admitting: Oncology

## 2020-05-16 DIAGNOSIS — D3A00 Benign carcinoid tumor of unspecified site: Secondary | ICD-10-CM

## 2020-05-16 DIAGNOSIS — C7A8 Other malignant neuroendocrine tumors: Secondary | ICD-10-CM

## 2020-05-30 ENCOUNTER — Ambulatory Visit: Payer: Medicare Other | Admitting: Gastroenterology

## 2020-06-08 ENCOUNTER — Ambulatory Visit (HOSPITAL_COMMUNITY)
Admission: RE | Admit: 2020-06-08 | Discharge: 2020-06-08 | Disposition: A | Discharge status: Home / Self Care | Payer: Medicare Other | Source: Ambulatory Visit | Attending: Oncology | Admitting: Oncology

## 2020-06-08 ENCOUNTER — Other Ambulatory Visit: Payer: Self-pay

## 2020-06-08 DIAGNOSIS — D3A00 Benign carcinoid tumor of unspecified site: Secondary | ICD-10-CM | POA: Insufficient documentation

## 2020-06-08 DIAGNOSIS — C7A8 Other malignant neuroendocrine tumors: Secondary | ICD-10-CM | POA: Diagnosis present

## 2020-06-08 MED ORDER — GALLIUM GA 68 DOTATATE IV KIT
4.7000 | PACK | Freq: Once | INTRAVENOUS | Status: AC | PRN
  Administered 2020-06-08: 4.7 via INTRAVENOUS

## 2020-06-13 ENCOUNTER — Other Ambulatory Visit: Payer: Self-pay | Admitting: "Endocrinology

## 2020-06-13 DIAGNOSIS — E1122 Type 2 diabetes mellitus with diabetic chronic kidney disease: Secondary | ICD-10-CM

## 2020-06-27 ENCOUNTER — Other Ambulatory Visit: Payer: Self-pay | Admitting: "Endocrinology

## 2020-07-06 ENCOUNTER — Other Ambulatory Visit: Payer: Self-pay | Admitting: *Deleted

## 2020-07-06 ENCOUNTER — Other Ambulatory Visit: Payer: Self-pay | Admitting: Cardiology

## 2020-07-06 ENCOUNTER — Telehealth: Payer: Self-pay | Admitting: *Deleted

## 2020-07-06 NOTE — Telephone Encounter (Signed)
Prescription refill request for Eliquis received. Indication: PAF Last office visit: 02/01/20 Scr: 1.34 on 06/15/20      1.5 on 05/02/20 Age: 86 Weight: 88.9  Based on above findings Eliquis 2.5mg  twice daily is the appropriate dose (Renal borderline up and down)  Continue on Lower dose.  Refill approved.

## 2020-07-06 NOTE — Telephone Encounter (Signed)
Prescription refill request for Eliquis received. Indication: PAF Last office visit: 02/01/20 Scr: 1.34 on 06/15/20    1.5 on 05/02/20 Age: 86 Weight: 88.9  Based on above findings Eliquis 2.5mg  twice daily is the appropriate dose (SCr borderline).  Will continue due to recent SCr of 1.5.  Refill approved.

## 2020-07-07 ENCOUNTER — Other Ambulatory Visit: Payer: Self-pay | Admitting: "Endocrinology

## 2020-07-28 ENCOUNTER — Ambulatory Visit (INDEPENDENT_AMBULATORY_CARE_PROVIDER_SITE_OTHER): Payer: Medicare Other | Admitting: Internal Medicine

## 2020-07-28 ENCOUNTER — Encounter: Payer: Self-pay | Admitting: Internal Medicine

## 2020-07-28 ENCOUNTER — Telehealth: Payer: Self-pay | Admitting: *Deleted

## 2020-07-28 ENCOUNTER — Other Ambulatory Visit: Payer: Self-pay

## 2020-07-28 VITALS — BP 159/72 | HR 59 | Temp 96.9°F | Ht 62.0 in | Wt 192.8 lb

## 2020-07-28 DIAGNOSIS — D508 Other iron deficiency anemias: Secondary | ICD-10-CM | POA: Diagnosis not present

## 2020-07-28 DIAGNOSIS — K582 Mixed irritable bowel syndrome: Secondary | ICD-10-CM

## 2020-07-28 DIAGNOSIS — K529 Noninfective gastroenteritis and colitis, unspecified: Secondary | ICD-10-CM | POA: Diagnosis not present

## 2020-07-28 DIAGNOSIS — K219 Gastro-esophageal reflux disease without esophagitis: Secondary | ICD-10-CM

## 2020-07-28 NOTE — Telephone Encounter (Signed)
Spoke with pt. Agile capsule scheduled for 5/12 at 7:30am, arrival 7am. Discussed instructions and also mailed instructions. Confirmed mailing address. Patient aware to call if she does not receive these in the mail. She voiced understanding.

## 2020-07-28 NOTE — Patient Instructions (Addendum)
We will perform a patency capsule study of your small intestine with a sample capsule to make sure it does not get stuck  After patency capsule study, we will schedule an EGD with esophageal dilation and a diagnostic colonoscopy for iron deficiency anemia, likely GI bleeding, chronic diarrhea and esophageal dysphagia in the near future.  ASA 4-propofol  Your anticoagulation will need to be stopped prior to the procedure at a minimum, you will need to come off Eliquis for 2 days prior to the procedure.  We will make contact with your cardiologist to see if that is all that needs to be done.  Sometimes you need to have some blood thinner shots in between stopping of your anticoagulation and the actual procedure.  Need a 0730 slot on the endoscopy schedule  Decrease Lantus injection to 12 units the night before the procedure  Hold Victoza and glipizide the morning of the procedure until after your procedures have been complete  Further recommendations to follow.  The risks, benefits, limitations, imponderables and alternatives regarding both EGD and colonoscopy have been reviewed with the patient. Questions have been answered. All parties agreeable.
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
## 2020-07-31 ENCOUNTER — Telehealth: Payer: Self-pay

## 2020-07-31 NOTE — Telephone Encounter (Signed)
This pt was seen by Dr.[name] and he would like to know if its ok for the patient to stop her eliquis for 2 days for a colonoscopy and EGD?   Thank you!

## 2020-08-02 NOTE — Telephone Encounter (Signed)
Patient with diagnosis of afib on Eliquis for anticoagulation.    Procedure: colonoscopy/EGD Date of procedure: TBD  CHA2DS2-VASc Score = 5  This indicates a 7.2% annual risk of stroke. The patient's score is based upon: CHF History: No HTN History: Yes Diabetes History: Yes Stroke History: No Vascular Disease History: No Age Score: 2 Gender Score: 1     CrCl 32 ml/min Platelet count 211  Per office protocol, patient can hold Elqiuis for 2 days prior to procedure.

## 2020-08-02 NOTE — Telephone Encounter (Signed)
Routing to Dr.[name] for FYI and clinical pool for when scheduling pt.

## 2020-08-02 NOTE — Telephone Encounter (Signed)
Clinical pharmacist to review Eliquis 

## 2020-08-03 NOTE — Telephone Encounter (Signed)
Noted. Will call pt to schedule procedure when Dr. [name]'s July schedule is available. 

## 2020-08-03 NOTE — Telephone Encounter (Signed)
Communication noted.  

## 2020-08-08 NOTE — Progress Notes (Signed)
Cardiology Office Note  Date: 08/09/2020   ID: [name], [name] May 14, 1934, MRN [id number]  PCP:  Leeanne Rio, MD  Cardiologist:  [name], MD Electrophysiologist:  None   Chief Complaint  Patient presents with  . Cardiac follow-up    History of Present Illness: [name] is an 86 y.o. female last seen in October 2021 by Mr. Leonides Sake NP.  She presents for a routine follow-up visit.  Reports no falls in the last 6 months.  She does not describe any sense of palpitations, chronically short of breath, NYHA class II with basic ADLs.  At last visit she was continued on Eliquis 2.5 mg twice daily.  She has not had any obvious GI bleeding, no change in stool.  Hemoglobin in March was 13.4.  I reviewed the remainder of her of her medications, outlined below.  Heart rate is adequately controlled today on Lopressor.  Past Medical History:  Diagnosis Date  . Anxiety   . Asthma   . Chronic back pain   . Colonoscopy causing post-procedural bleeding    Incomplete. by Dr. [name] 06/11/99 due to fixed non-compliant colon precluded exam to 40 cm, she had subsequent colectomy for diverticulitis since that time.   . Diarrhea   . Esophageal reflux   . Essential hypertension   . Fibromyalgia   . Hiatal hernia    Recent EGD/TCS showed small hiatal hernia, normal apperaring tubular esophagus. s/p passage of a 54-french Maloney dilator, s/p biopsy esophageal mucosa, multiple fundal gland type gastric polyps. one large pedunculated polp with oozing, noted s/p clipping and snare polypectomy. Biopsies of the gastric mucosa taken given her symptoms in her elevated count. normal D1-D3, s/p biospy D2-D3.   [name] Hiatal hernia    all bx unremarkable. on TCS she had left-sided diverticula, evidence of prior segmental resection with anastomosis, multiple colonic polyps s/p multiple snare polypectomies, s/p segmental biopsy and stool sampling. she had multiple tubular adenomas. no  microscopic/collagenous colitis. stool culture, c diff, O+P, lactoferrin were negative.   . Mixed hyperlipidemia   . Nephrolithiasis   . OSA (obstructive sleep apnea)   . Permanent atrial fibrillation (HCC)    Previous failed DCCV  . Type 2 diabetes mellitus ([place])   . Ureteral stent retained    Not retained. ureteral stent removal gross hematuria resolved 10/11. coumadin restarted.   . Ventral hernia     Past Surgical History:  Procedure Laterality Date  . BREAST LUMPECTOMY     benign cyst  . CATARACT EXTRACTION W/PHACO Left 08/17/2012   Procedure: CATARACT EXTRACTION PHACO AND INTRAOCULAR LENS PLACEMENT (IOC);  Surgeon: [name], MD;  Location: [facility];  Service: Ophthalmology;  Laterality: Left;  CDE:18.73  . CATARACT EXTRACTION W/PHACO Right 08/13/2019   Procedure: CATARACT EXTRACTION PHACO AND INTRAOCULAR LENS PLACEMENT (IOC);  Surgeon: [name], MD;  Location: [facility];  Service: Ophthalmology;  Laterality: Right;  CDE: 7.14  . COLECTOMY     2005. Dr. Geoffry Paradise for diverticulitis  . COLONOSCOPY  02/22/09   normal/left-sided diverticula/multiple colonic polyp, adenomatous  . COLONOSCOPY  12/16/2011   colonic polyps-treated as described above. Status postprior sigmoid resection. adenomatous. next TCS 12/2014  . COLONOSCOPY N/A 12/28/2014   Status post prior segmental resection. Few residual colonic diverticula- status post segmental biopsy negative random colon biopsies  . ESOPHAGEAL DILATION N/A 12/28/2014   Procedure: ESOPHAGEAL DILATION;  Surgeon: [name], MD;  [facility];  Service: Endoscopy;  Laterality: N/A;  .  ESOPHAGOGASTRODUODENOSCOPY  02/22/09   normal s/p dilator;small HH/one large pedunculates polyp  s/p clipping, hyperplastic  . ESOPHAGOGASTRODUODENOSCOPY  12/16/2011   [name]: small hiatal hernia. Hyperplastic apperating polyps. Status post [name] dilation as described above.  . ESOPHAGOGASTRODUODENOSCOPY N/A 12/28/2014   RMR: Normal-appearing  esophagus status post passage of a maloney dilator. Hiatal hernia. abnormal gastic mucosa of uncertain significance status post gastric biopsy with mild chronic gastritis, no H pylori.   . TONSILLECTOMY    . TOTAL ABDOMINAL HYSTERECTOMY      Current Outpatient Medications  Medication Sig Dispense Refill  . albuterol (VENTOLIN HFA) 108 (90 Base) MCG/ACT inhaler Inhale 2 puffs into the lungs every 6 (six) hours as needed for wheezing or shortness of breath.    [name] amLODipine (NORVASC) 5 MG tablet TAKE 1 TABLET BY MOUTH EVERY DAY 90 tablet 3  . Continuous Blood Gluc Sensor (FREESTYLE LIBRE SENSOR SYSTEM) MISC Use one sensor every 10 days. 3 each 2  . ELIQUIS 2.5 MG TABS tablet TAKE 1 TABLET BY MOUTH TWICE DAILY 60 tablet 3  . furosemide (LASIX) 20 MG tablet Take 10 mg by mouth daily.    [name] glipiZIDE (GLUCOTROL XL) 2.5 MG 24 hr tablet TAKE 1 TABLET BY MOUTH EVERY DAY WITH BREAKFAST 90 tablet 1  . GLOBAL EASE INJECT PEN NEEDLES 31G X 5 MM MISC USE ONE TIP WITH INSULIN AT BEDTIME 100 each 3  . LANTUS SOLOSTAR 100 UNIT/ML Solostar Pen INJECT 24 UNITS INTO THE SKIN AT BEDTIME 15 mL 2  . loperamide (IMODIUM) 2 MG capsule Take 1 mg by mouth as needed for diarrhea or loose stools.    . metoprolol tartrate (LOPRESSOR) 25 MG tablet TAKE 1 TABLET BY MOUTH TWICE DAILY 180 tablet 3  . nitroGLYCERIN (NITROSTAT) 0.4 MG SL tablet PLACE 1 TABLET UNDER THE TONGUE EVERY 5 MINUTES FOR 3 DOSES AS NEEDED FOR CHEST PAIN. IF YOU HAVE NO RELIEF AFTER THE 3RD DOSE PROCEED TO THE ED FOR AN EVALUATION 90 tablet 3  . ondansetron (ZOFRAN-ODT) 4 MG disintegrating tablet Take 4 mg by mouth every 8 (eight) hours as needed for nausea or vomiting.     . pantoprazole (PROTONIX) 40 MG tablet TAKE 1 TABLET BY MOUTH TWICE DAILY 60 tablet 5  . simvastatin (ZOCOR) 20 MG tablet Take 20 mg by mouth daily.     [name] VICTOZA 18 MG/3ML SOPN INJECT 1.2MG  INTO THE SKIN DAILY 9 mL 0  . Wheat Dextrin (BENEFIBER) POWD Take by mouth as needed. 1 tsp as  needed     No current facility-administered medications for this visit.   Allergies:  Adhesive [tape], Demerol [meperidine], Iohexol, Meperidine hcl, Shellfish allergy, and Penicillins   ROS: No orthopnea or PND.  Physical Exam: VS:  BP 115/60   Pulse 65   Ht 5\' 2"  (2.297 m)   Wt 192 lb 9.6 oz (87.4 kg)   SpO2 96%   BMI 35.23 kg/m , BMI Body mass index is 35.23 kg/m.  Wt Readings from Last 3 Encounters:  08/09/20 192 lb 9.6 oz (87.4 kg)  07/28/20 192 lb 12.8 oz (87.5 kg)  05/05/20 192 lb 3.2 oz (87.2 kg)    General: Patient appears comfortable at rest. HEENT: Conjunctiva and lids normal, wearing a mask. Neck: Supple, no elevated JVP or carotid bruits, no thyromegaly. Lungs: Clear to auscultation, nonlabored breathing at rest. Cardiac: Irregularly irregular, no S3, 1/6 systolic murmur, no pericardial rub. Extremities: Mild ankle edema.  ECG:  An ECG dated 11/01/2019 was personally  reviewed today and demonstrated:  Rate controlled atrial fibrillation with borderline low voltage and decreased R wave progression.  Recent Labwork: 10/15/2019: TSH 2.92 04/27/2020: ALT 9; AST 13; BUN 27; Creatinine, Ser 1.61; Potassium 4.9; Sodium 142     Component Value Date/Time   CHOL 123 10/15/2019 0828   TRIG 96 10/15/2019 0828   HDL 39 (L) 10/15/2019 0828   CHOLHDL 3.2 10/15/2019 0828   VLDL 35 07/11/2009 2342   LDLCALC 66 10/15/2019 0828  March 2022: Potassium 4.5, BUN 21, creatinine 1.34, AST 15, ALT 14, hemoglobin 13.4, platelets 211  Other Studies Reviewed Today:  Echocardiogram 06/22/2018 [facility]): Normal LV wall thickness with mild chamber dilatation and LVEF 80%, mild left atrial enlargement, normal RV contraction, mild mitral regurgitation, sclerotic aortic valve with trace aortic regurgitation, mild tricuspid regurgitation with estimated PASP 21 mmHg, trivial pericardial effusion.  Assessment and Plan:  1.  Permanent atrial fibrillation, CHA2DS2-VASc score is 7.  She  continues on Eliquis at 2.5 mg twice daily due to history of GI bleed, tolerating so far with last hemoglobin 13.4.  Heart rate is well controlled today on Lopressor, no changes made.  2.  Mixed hyperlipidemia, she continues on Zocor.  3.  Essential hypertension, currently on Norvasc along with Lopressor.  Blood pressure well controlled today.  Medication Adjustments/Labs and Tests Ordered: Current medicines are reviewed at length with the patient today.  Concerns regarding medicines are outlined above.   Tests Ordered: No orders of the defined types were placed in this encounter.   Medication Changes: No orders of the defined types were placed in this encounter.   Disposition:  Follow up 6 months.  Signed, [name], MD, [facility] 08/09/2020 9:04 AM    [facility] [place] [facility] [address], [place], [place] [address] Phone: [phone]; Fax: [phone]

## 2020-08-09 ENCOUNTER — Ambulatory Visit: Payer: Medicare Other | Admitting: Cardiology

## 2020-08-09 ENCOUNTER — Encounter: Payer: Self-pay | Admitting: Cardiology

## 2020-08-09 VITALS — BP 115/60 | HR 65 | Ht 62.0 in | Wt 192.6 lb

## 2020-08-09 DIAGNOSIS — I10 Essential (primary) hypertension: Secondary | ICD-10-CM | POA: Diagnosis not present

## 2020-08-09 DIAGNOSIS — I4821 Permanent atrial fibrillation: Secondary | ICD-10-CM

## 2020-08-09 DIAGNOSIS — E782 Mixed hyperlipidemia: Secondary | ICD-10-CM | POA: Diagnosis not present

## 2020-08-09 NOTE — Patient Instructions (Addendum)

## 2020-08-11 ENCOUNTER — Other Ambulatory Visit: Payer: Self-pay | Admitting: "Endocrinology

## 2020-08-11 DIAGNOSIS — E1122 Type 2 diabetes mellitus with diabetic chronic kidney disease: Secondary | ICD-10-CM

## 2020-08-11 DIAGNOSIS — N183 Chronic kidney disease, stage 3 unspecified: Secondary | ICD-10-CM

## 2020-08-14 ENCOUNTER — Telehealth: Payer: Self-pay

## 2020-08-14 NOTE — Telephone Encounter (Signed)
Pt called office and LMOVM, she wants to know if her insurance will by paying for Agile capsule scheduled for this week.  Called pt back, informed her Agile capsule doesn't need a PA. Gave her phone# to pre-service center for assistance.

## 2020-08-17 ENCOUNTER — Ambulatory Visit (HOSPITAL_COMMUNITY)
Admission: RE | Admit: 2020-08-17 | Discharge: 2020-08-17 | Disposition: A | Discharge status: Home / Self Care | Payer: Medicare Other | Attending: Internal Medicine | Admitting: Internal Medicine

## 2020-08-17 ENCOUNTER — Encounter (HOSPITAL_COMMUNITY)
Admission: RE | Disposition: A | Discharge status: Home / Self Care | Payer: Self-pay | Source: Home / Self Care | Attending: Internal Medicine

## 2020-08-17 DIAGNOSIS — D649 Anemia, unspecified: Secondary | ICD-10-CM | POA: Diagnosis present

## 2020-08-17 HISTORY — PX: AGILE CAPSULE: SHX5420

## 2020-08-17 SURGERY — AGILE CAPSULE

## 2020-08-18 ENCOUNTER — Ambulatory Visit (HOSPITAL_COMMUNITY)
Admission: RE | Admit: 2020-08-18 | Discharge: 2020-08-18 | Disposition: A | Discharge status: Home / Self Care | Payer: Medicare Other | Source: Ambulatory Visit | Attending: Internal Medicine | Admitting: Internal Medicine

## 2020-08-18 ENCOUNTER — Encounter (HOSPITAL_COMMUNITY): Payer: Self-pay | Admitting: Internal Medicine

## 2020-08-18 DIAGNOSIS — D508 Other iron deficiency anemias: Secondary | ICD-10-CM | POA: Diagnosis present

## 2020-08-24 ENCOUNTER — Telehealth: Payer: Self-pay

## 2020-08-24 NOTE — Telephone Encounter (Signed)
Pt called- she wants to know when we are going to get her scheduled for her next tests. I have reviewed her chart and spoken with Reba and Vicente Males. It is ok to schedule pt for EGD w/dilation and a tcs for IDA per Dr.Rourks ov note. We have already gotten the ok to hold her eliquis for 2 days prior ( see phone note with cardiology)   Clinical pool- please call pt to schedule procedures- please see Dr.Rourks AVS for diabetic medication and eliquis instructions.   Not sure if Dr.[name] wants to do [place], will route to him for further recommendations when he returns.

## 2020-08-24 NOTE — Telephone Encounter (Signed)
We already have orders. Patient needs a 7:30am slot and awaiting schedule to be able schedule. Will call patient once we receive it

## 2020-08-25 NOTE — Telephone Encounter (Signed)
Since pt had called and asked what the next steps were, I called and advised her what the plan was and what we were waiting for. Pt thanked me for the call and said she doesn't want to do any procedures around her birthday which is [birth year].

## 2020-08-29 ENCOUNTER — Other Ambulatory Visit: Payer: Self-pay | Admitting: "Endocrinology

## 2020-08-29 NOTE — Telephone Encounter (Signed)
Routing to clinical pool. Pt will only need tcs/egd when you are able to schedule procedure.

## 2020-08-29 NOTE — Telephone Encounter (Signed)
Discussed with Dr. Thornton Papas.  Capsule not confirmed to have passed on the follow-up x-ray.  Therefore, we will pursue EGD and colonoscopy only.

## 2020-08-30 NOTE — Telephone Encounter (Signed)
Noted TCS/EGD only needed.

## 2020-09-07 ENCOUNTER — Telehealth: Payer: Self-pay

## 2020-09-07 NOTE — Telephone Encounter (Signed)
Called pt to schedule TCS/EGD/DIL, she isn't able to schedule procedure at this time. States she has been sick for past week. Will call office when she's feeling better and able to schedule procedure.

## 2020-09-07 NOTE — Telephone Encounter (Signed)
Communication noted.  

## 2020-10-12 ENCOUNTER — Telehealth: Payer: Self-pay

## 2020-10-12 NOTE — Telephone Encounter (Signed)
Offoice note faxed to [facility]

## 2020-10-13 ENCOUNTER — Other Ambulatory Visit: Payer: Self-pay | Admitting: "Endocrinology

## 2020-10-13 DIAGNOSIS — N183 Chronic kidney disease, stage 3 unspecified: Secondary | ICD-10-CM

## 2020-10-13 DIAGNOSIS — E1122 Type 2 diabetes mellitus with diabetic chronic kidney disease: Secondary | ICD-10-CM

## 2020-10-26 ENCOUNTER — Other Ambulatory Visit: Payer: Self-pay | Admitting: Gastroenterology

## 2020-11-07 ENCOUNTER — Ambulatory Visit: Payer: Medicare Other | Admitting: "Endocrinology

## 2020-11-10 ENCOUNTER — Encounter: Payer: Self-pay | Admitting: "Endocrinology

## 2020-11-10 ENCOUNTER — Other Ambulatory Visit: Payer: Self-pay

## 2020-11-10 ENCOUNTER — Ambulatory Visit (INDEPENDENT_AMBULATORY_CARE_PROVIDER_SITE_OTHER): Payer: Medicare Other | Admitting: "Endocrinology

## 2020-11-10 VITALS — BP 130/76 | HR 54 | Ht 62.0 in

## 2020-11-10 DIAGNOSIS — N183 Chronic kidney disease, stage 3 unspecified: Secondary | ICD-10-CM | POA: Diagnosis not present

## 2020-11-10 DIAGNOSIS — E782 Mixed hyperlipidemia: Secondary | ICD-10-CM

## 2020-11-10 DIAGNOSIS — I10 Essential (primary) hypertension: Secondary | ICD-10-CM | POA: Diagnosis not present

## 2020-11-10 DIAGNOSIS — E1122 Type 2 diabetes mellitus with diabetic chronic kidney disease: Secondary | ICD-10-CM | POA: Diagnosis not present

## 2020-11-10 LAB — POCT GLYCOSYLATED HEMOGLOBIN (HGB A1C): HbA1c, POC (controlled diabetic range): 6.9 % (ref 0.0–7.0)

## 2020-11-10 NOTE — Patient Instructions (Signed)
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
## 2020-11-26 ENCOUNTER — Other Ambulatory Visit: Payer: Self-pay | Admitting: Cardiology

## 2020-11-27 ENCOUNTER — Other Ambulatory Visit: Payer: Self-pay | Admitting: Cardiology

## 2020-11-27 ENCOUNTER — Other Ambulatory Visit: Payer: Self-pay | Admitting: "Endocrinology

## 2020-11-27 NOTE — Telephone Encounter (Signed)
Prescription refill request for Eliquis received. Indication: Atrial fib Last office visit: 08/09/20  Myles Gip MD Scr: 1.34 on 06/15/20 Age:  86 Weight: 87.4kg  Charted reviewed.  Per last office visit pt was left on Eliquis 2.'5mg'$  twice daily due to history of GI bleed and elevated SCr.  SCr was improved on 06/15/20.  Will continue Eliquis 2.'5mg'$  twice daily an reassess at next [facility] in Nov. 2022.  Refill approved.

## 2020-12-05 ENCOUNTER — Other Ambulatory Visit: Payer: Self-pay | Admitting: Cardiology

## 2020-12-11 ENCOUNTER — Other Ambulatory Visit: Payer: Self-pay | Admitting: "Endocrinology

## 2020-12-11 DIAGNOSIS — N183 Chronic kidney disease, stage 3 unspecified: Secondary | ICD-10-CM

## 2021-02-08 ENCOUNTER — Other Ambulatory Visit: Payer: Self-pay | Admitting: "Endocrinology

## 2021-02-08 DIAGNOSIS — E1122 Type 2 diabetes mellitus with diabetic chronic kidney disease: Secondary | ICD-10-CM

## 2021-02-08 DIAGNOSIS — N183 Chronic kidney disease, stage 3 unspecified: Secondary | ICD-10-CM

## 2021-03-26 ENCOUNTER — Other Ambulatory Visit: Payer: Self-pay | Admitting: "Endocrinology

## 2021-04-19 ENCOUNTER — Encounter: Payer: Self-pay | Admitting: "Endocrinology

## 2021-04-19 LAB — HM DIABETES EYE EXAM

## 2021-04-25 ENCOUNTER — Other Ambulatory Visit: Payer: Self-pay | Admitting: Gastroenterology

## 2021-04-25 ENCOUNTER — Other Ambulatory Visit: Payer: Self-pay | Admitting: Cardiology

## 2021-04-25 NOTE — Telephone Encounter (Signed)
Prescription refill request for Eliquis received. Indication: Atrial Fib Last office visit: 08/09/20  Myles Gip MD Scr: 1.30 on 11/28/20 Age: 87 Weight: 87.4kg  Based on most recent labs Eliquis 5mg  twice daily would be the appropriate dose but pt has been on 2.5mg  twice daily for past SCr of 1.5.  Patient is PAST DUE for appt with Dr Domenic Polite.  Will get appt scheduled.  Pt will need repeat BMP at  that time to determine appropriate Eliquis dosage. Refill approved x 2

## 2021-05-11 ENCOUNTER — Telehealth: Payer: Self-pay | Admitting: "Endocrinology

## 2021-05-11 DIAGNOSIS — N183 Chronic kidney disease, stage 3 unspecified: Secondary | ICD-10-CM

## 2021-05-11 DIAGNOSIS — E039 Hypothyroidism, unspecified: Secondary | ICD-10-CM

## 2021-05-11 DIAGNOSIS — E1122 Type 2 diabetes mellitus with diabetic chronic kidney disease: Secondary | ICD-10-CM

## 2021-05-11 NOTE — Telephone Encounter (Signed)
New order printed.

## 2021-05-11 NOTE — Telephone Encounter (Signed)
Will you update her labs because by the time she goes it will  be expired

## 2021-05-15 ENCOUNTER — Ambulatory Visit: Payer: Medicare Other | Admitting: "Endocrinology

## 2021-05-17 LAB — COMPREHENSIVE METABOLIC PANEL
ALT: 11 IU/L (ref 0–32)
AST: 16 IU/L (ref 0–40)
Albumin/Globulin Ratio: 1.6 (ref 1.2–2.2)
Albumin: 4.2 g/dL (ref 3.6–4.6)
Alkaline Phosphatase: 95 IU/L (ref 44–121)
BUN/Creatinine Ratio: 13 (ref 12–28)
BUN: 20 mg/dL (ref 8–27)
Bilirubin Total: 0.4 mg/dL (ref 0.0–1.2)
CO2: 23 mmol/L (ref 20–29)
Calcium: 9.4 mg/dL (ref 8.7–10.3)
Chloride: 104 mmol/L (ref 96–106)
Creatinine, Ser: 1.49 mg/dL — ABNORMAL HIGH (ref 0.57–1.00)
Globulin, Total: 2.7 g/dL (ref 1.5–4.5)
Glucose: 156 mg/dL — ABNORMAL HIGH (ref 70–99)
Potassium: 4.9 mmol/L (ref 3.5–5.2)
Sodium: 141 mmol/L (ref 134–144)
Total Protein: 6.9 g/dL (ref 6.0–8.5)
eGFR: 34 mL/min/{1.73_m2} — ABNORMAL LOW (ref >59)

## 2021-05-17 LAB — TSH: TSH: 4.08 u[IU]/mL (ref 0.450–4.500)

## 2021-05-17 LAB — LIPID PANEL
Chol/HDL Ratio: 2.9 ratio (ref 0.0–4.4)
Cholesterol, Total: 132 mg/dL (ref 100–199)
HDL: 45 mg/dL (ref >39)
LDL Chol Calc (NIH): 69 mg/dL (ref 0–99)
Triglycerides: 94 mg/dL (ref 0–149)
VLDL Cholesterol Cal: 18 mg/dL (ref 5–40)

## 2021-05-17 LAB — T4, FREE: Free T4: 1.37 ng/dL (ref 0.82–1.77)

## 2021-05-24 ENCOUNTER — Ambulatory Visit (INDEPENDENT_AMBULATORY_CARE_PROVIDER_SITE_OTHER): Payer: Medicare Other | Admitting: "Endocrinology

## 2021-05-24 ENCOUNTER — Other Ambulatory Visit: Payer: Self-pay

## 2021-05-24 ENCOUNTER — Encounter: Payer: Self-pay | Admitting: "Endocrinology

## 2021-05-24 ENCOUNTER — Other Ambulatory Visit: Payer: Self-pay | Admitting: "Endocrinology

## 2021-05-24 VITALS — BP 148/66 | HR 60 | Ht 62.0 in | Wt 186.8 lb

## 2021-05-24 DIAGNOSIS — E782 Mixed hyperlipidemia: Secondary | ICD-10-CM | POA: Diagnosis not present

## 2021-05-24 DIAGNOSIS — N183 Chronic kidney disease, stage 3 unspecified: Secondary | ICD-10-CM | POA: Diagnosis not present

## 2021-05-24 DIAGNOSIS — E1122 Type 2 diabetes mellitus with diabetic chronic kidney disease: Secondary | ICD-10-CM

## 2021-05-24 DIAGNOSIS — E039 Hypothyroidism, unspecified: Secondary | ICD-10-CM | POA: Insufficient documentation

## 2021-05-24 DIAGNOSIS — I10 Essential (primary) hypertension: Secondary | ICD-10-CM | POA: Diagnosis not present

## 2021-05-24 LAB — POCT GLYCOSYLATED HEMOGLOBIN (HGB A1C): HbA1c, POC (controlled diabetic range): 7.5 % — AB (ref 0.0–7.0)
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
## 2021-05-24 NOTE — Telephone Encounter (Signed)
Patient has an appointment with you today.  Sending to you in case you need to make any changes.

## 2021-05-24 NOTE — Patient Instructions (Signed)

## 2021-06-08 ENCOUNTER — Other Ambulatory Visit: Payer: Self-pay | Admitting: "Endocrinology

## 2021-06-08 DIAGNOSIS — N183 Chronic kidney disease, stage 3 unspecified: Secondary | ICD-10-CM

## 2021-06-08 DIAGNOSIS — E1122 Type 2 diabetes mellitus with diabetic chronic kidney disease: Secondary | ICD-10-CM

## 2021-06-24 ENCOUNTER — Other Ambulatory Visit: Payer: Self-pay | Admitting: Cardiology

## 2021-06-25 NOTE — Telephone Encounter (Signed)
Prescription refill request for Eliquis received. ?Indication: PAF ?Last office visit: 08/09/20  Myles Gip MD ?Scr: 1.49 on 05/16/21 ?Age: 87 ?Weight: 87.4kg ? ?Based on above findings Eliquis 2.'5mg'$  twice daily is the appropriate dose.  Refill approved. Has F/U appt with B Strader PA-C on 06/29/21. ? ?

## 2021-06-29 ENCOUNTER — Ambulatory Visit: Payer: Medicare Other | Admitting: Student

## 2021-08-02 ENCOUNTER — Other Ambulatory Visit: Payer: Self-pay | Admitting: "Endocrinology

## 2021-08-02 DIAGNOSIS — N183 Chronic kidney disease, stage 3 unspecified: Secondary | ICD-10-CM

## 2021-09-24 ENCOUNTER — Other Ambulatory Visit: Payer: Self-pay | Admitting: "Endocrinology
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
## 2021-09-27 ENCOUNTER — Encounter: Payer: Self-pay | Admitting: Student

## 2021-09-27 ENCOUNTER — Ambulatory Visit (INDEPENDENT_AMBULATORY_CARE_PROVIDER_SITE_OTHER): Payer: Medicare Other | Admitting: Student

## 2021-09-27 VITALS — BP 124/78 | HR 87 | Ht 61.0 in | Wt 181.6 lb

## 2021-09-27 DIAGNOSIS — I4821 Permanent atrial fibrillation: Secondary | ICD-10-CM | POA: Diagnosis not present

## 2021-09-27 DIAGNOSIS — I10 Essential (primary) hypertension: Secondary | ICD-10-CM

## 2021-09-27 DIAGNOSIS — E782 Mixed hyperlipidemia: Secondary | ICD-10-CM

## 2021-09-27 DIAGNOSIS — R600 Localized edema: Secondary | ICD-10-CM | POA: Diagnosis not present

## 2021-09-27 NOTE — Patient Instructions (Signed)
Medication Instructions:  Your physician recommends that you continue on your current medications as directed. Please refer to the Current Medication list given to you today.  *If you need a refill on your cardiac medications before your next appointment, please call your pharmacy*   Lab Work: NONE   If you have labs (blood work) drawn today and your tests are completely normal, you will receive your results only by: MyChart Message (if you have MyChart) OR A paper copy in the mail If you have any lab test that is abnormal or we need to change your treatment, we will call you to review the results.   Testing/Procedures: NONE    Follow-Up: At [facility], you and your health needs are our priority.  As part of our continuing mission to provide you with exceptional heart care, we have created designated Provider Care Teams.  These Care Teams include your primary Cardiologist (physician) and Advanced Practice Providers (APPs -  Physician Assistants and Nurse Practitioners) who all work together to provide you with the care you need, when you need it.  We recommend signing up for the patient portal called "MyChart".  Sign up information is provided on this After Visit Summary.  MyChart is used to connect with patients for Virtual Visits (Telemedicine).  Patients are able to view lab/test results, encounter notes, upcoming appointments, etc.  Non-urgent messages can be sent to your provider as well.   To learn more about what you can do with MyChart, go to [email or web address].    Your next appointment:   6 month(s)  The format for your next appointment:   In Person  Provider:   Samuel McDowell, MD    Other Instructions Thank you for choosing [facility]!    Important Information About Sugar       

## 2021-10-18 ENCOUNTER — Other Ambulatory Visit: Payer: Self-pay | Admitting: Gastroenterology

## 2021-11-19 ENCOUNTER — Other Ambulatory Visit: Payer: Self-pay | Admitting: Cardiology

## 2021-11-21 ENCOUNTER — Ambulatory Visit: Payer: Medicare Other | Admitting: "Endocrinology

## 2021-11-21 ENCOUNTER — Other Ambulatory Visit: Payer: Self-pay | Admitting: "Endocrinology

## 2021-11-22 ENCOUNTER — Encounter: Payer: Self-pay | Admitting: "Endocrinology

## 2021-11-22 ENCOUNTER — Ambulatory Visit (INDEPENDENT_AMBULATORY_CARE_PROVIDER_SITE_OTHER): Payer: Medicare Other | Admitting: "Endocrinology

## 2021-11-22 VITALS — BP 134/66 | HR 60 | Ht 61.0 in | Wt 182.2 lb

## 2021-11-22 DIAGNOSIS — N183 Chronic kidney disease, stage 3 unspecified: Secondary | ICD-10-CM

## 2021-11-22 DIAGNOSIS — E1122 Type 2 diabetes mellitus with diabetic chronic kidney disease: Secondary | ICD-10-CM

## 2021-11-22 DIAGNOSIS — I10 Essential (primary) hypertension: Secondary | ICD-10-CM

## 2021-11-22 DIAGNOSIS — E782 Mixed hyperlipidemia: Secondary | ICD-10-CM | POA: Diagnosis not present

## 2021-11-22 DIAGNOSIS — E039 Hypothyroidism, unspecified: Secondary | ICD-10-CM

## 2021-11-22 DIAGNOSIS — Z6834 Body mass index (BMI) 34.0-34.9, adult: Secondary | ICD-10-CM

## 2021-11-22 DIAGNOSIS — E6609 Other obesity due to excess calories: Secondary | ICD-10-CM | POA: Insufficient documentation

## 2021-11-22 LAB — POCT GLYCOSYLATED HEMOGLOBIN (HGB A1C): HbA1c, POC (controlled diabetic range): 6.7 % (ref 0.0–7.0)

## 2021-11-22 MED ORDER — LANTUS SOLOSTAR 100 UNIT/ML ~~LOC~~ SOPN: 18.0000 [IU] | PEN_INJECTOR | Freq: Every day | SUBCUTANEOUS | 1 refills | Status: DC

## 2021-11-22 MED ORDER — FREESTYLE LIBRE 2 READER DEVI: 0 refills | Status: DC

## 2021-11-22 MED ORDER — FREESTYLE LIBRE 2 SENSOR MISC: 1.0000 | 3 refills | Status: DC
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
## 2021-12-06 ENCOUNTER — Other Ambulatory Visit: Payer: Self-pay | Admitting: "Endocrinology

## 2021-12-06 DIAGNOSIS — E1122 Type 2 diabetes mellitus with diabetic chronic kidney disease: Secondary | ICD-10-CM

## 2021-12-07 ENCOUNTER — Other Ambulatory Visit: Payer: Self-pay | Admitting: Cardiology

## 2022-01-22 ENCOUNTER — Telehealth: Payer: Self-pay | Admitting: "Endocrinology

## 2022-01-22 DIAGNOSIS — N183 Chronic kidney disease, stage 3 unspecified: Secondary | ICD-10-CM

## 2022-01-22 MED ORDER — FREESTYLE LIBRE 2 READER DEVI: 0 refills | Status: DC

## 2022-01-22 NOTE — Telephone Encounter (Signed)
New message   The patient was recently discharge from hospital and was taken off [place] 18 MG/3ML SOPN. Does not anything else in place .    Asking for a call back to discuss.

## 2022-01-22 NOTE — Telephone Encounter (Signed)
Rx sent 

## 2022-01-22 NOTE — Telephone Encounter (Signed)
Discussed with pt, understanding voiced. 

## 2022-01-22 NOTE — Telephone Encounter (Signed)
New message     1. Which medications need to be refilled? (please list name of each medication and dose if known) Continuous Blood Gluc Receiver (FREESTYLE LIBRE 2 READER) DEVI  2. Which pharmacy/location (including street and city if local pharmacy) is medication to be sent to? [facility]    3. Do they need a 30 day or 90 day supply?

## 2022-02-06 ENCOUNTER — Other Ambulatory Visit: Payer: Self-pay | Admitting: "Endocrinology

## 2022-02-06 DIAGNOSIS — E1122 Type 2 diabetes mellitus with diabetic chronic kidney disease: Secondary | ICD-10-CM

## 2022-02-21 ENCOUNTER — Other Ambulatory Visit: Payer: Self-pay | Admitting: "Endocrinology

## 2022-02-21 ENCOUNTER — Other Ambulatory Visit: Payer: Self-pay | Admitting: Internal Medicine

## 2022-03-12 NOTE — Progress Notes (Unsigned)
Cardiology Office Note  Date: 03/13/2022   ID: TYLAH [name], DOB [birth year], MRN [id number]  PCP:  Pcp, No  Cardiologist:  [name], MD Electrophysiologist:  None   Chief Complaint  Patient presents with   Cardiac follow-up    History of Present Illness: [name] is an 87 y.o. female last seen in June by Ms. Strader PA-C, I reviewed the note.  She is here for a follow-up visit.  Complains of intermittent orthostatic lightheadedness, tends to happen after she takes her morning medications including both Norvasc and Lopressor.  Doses of both were already cut back by PCP following visit in October.  Medications were also simplified at that time.  I reviewed her lab work, Eliquis is at appropriate dose, she does not report any spontaneous bleeding problems.  No palpitations or frank syncope.  Prior PCP Dr. Huel Cote has left her practice and patient is looking for a new PCP potentially in [place].  We did discuss getting an updated echocardiogram for follow-up of aortic sclerosis, murmur somewhat more prominent today.  Her last echocardiogram was in 2020 in [place].  Past Medical History:  Diagnosis Date   Anxiety    Asthma    Chronic back pain    Colonoscopy causing post-procedural bleeding    Incomplete. by Dr. [name] 06/11/99 due to fixed non-compliant colon precluded exam to 40 cm, she had subsequent colectomy for diverticulitis since that time.    Diarrhea    Esophageal reflux    Essential hypertension    Fibromyalgia    Hiatal hernia    Recent EGD/TCS showed small hiatal hernia, normal apperaring tubular esophagus. s/p passage of a 54-french Maloney dilator, s/p biopsy esophageal mucosa, multiple fundal gland type gastric polyps. one large pedunculated polp with oozing, noted s/p clipping and snare polypectomy. Biopsies of the gastric mucosa taken given her symptoms in her elevated count. normal D1-D3, s/p biospy D2-D3.    Hiatal hernia    all bx  unremarkable. on TCS she had left-sided diverticula, evidence of prior segmental resection with anastomosis, multiple colonic polyps s/p multiple snare polypectomies, s/p segmental biopsy and stool sampling. she had multiple tubular adenomas. no microscopic/collagenous colitis. stool culture, c diff, O+P, lactoferrin were negative.    Mixed hyperlipidemia    Nephrolithiasis    OSA (obstructive sleep apnea)    Permanent atrial fibrillation (HCC)    Previous failed DCCV   Type 2 diabetes mellitus ([place])    Ureteral stent retained    Not retained. ureteral stent removal gross hematuria resolved 10/11. coumadin restarted.    Ventral hernia     Past Surgical History:  Procedure Laterality Date   AGILE CAPSULE N/A 08/17/2020   Procedure: AGILE CAPSULE;  Surgeon: [name], MD;  [facility];  Service: Endoscopy;  Laterality: N/A;  7:30am   BREAST LUMPECTOMY     benign cyst   CATARACT EXTRACTION W/PHACO Left 08/17/2012   Procedure: CATARACT EXTRACTION PHACO AND INTRAOCULAR LENS PLACEMENT ([place]);  Surgeon: [name], MD;  Location: [facility];  Service: Ophthalmology;  Laterality: Left;  CDE:18.73   CATARACT EXTRACTION W/PHACO Right 08/13/2019   Procedure: CATARACT EXTRACTION PHACO AND INTRAOCULAR LENS PLACEMENT ([facility]);  Surgeon: [name], MD;  Location: [facility];  Service: Ophthalmology;  Laterality: Right;  CDE: 7.14   COLECTOMY     2005. Dr. Geoffry Paradise for diverticulitis   COLONOSCOPY  02/22/09   normal/left-sided diverticula/multiple colonic polyp, adenomatous   COLONOSCOPY  12/16/2011   colonic polyps-treated  as described above. Status postprior sigmoid resection. adenomatous. next TCS 12/2014   COLONOSCOPY N/A 12/28/2014   Status post prior segmental resection. Few residual colonic diverticula- status post segmental biopsy negative random colon biopsies   ESOPHAGEAL DILATION N/A 12/28/2014   Procedure: ESOPHAGEAL DILATION;  Surgeon: [name], MD;  [facility];   Service: Endoscopy;  Laterality: N/A;   ESOPHAGOGASTRODUODENOSCOPY  02/22/09   normal s/p dilator;small HH/one large pedunculates polyp  s/p clipping, hyperplastic   ESOPHAGOGASTRODUODENOSCOPY  12/16/2011   [name]: small hiatal hernia. Hyperplastic apperating polyps. Status post [name] dilation as described above.   ESOPHAGOGASTRODUODENOSCOPY N/A 12/28/2014   RMR: Normal-appearing esophagus status post passage of a maloney dilator. Hiatal hernia. abnormal gastic mucosa of uncertain significance status post gastric biopsy with mild chronic gastritis, no H pylori.    TONSILLECTOMY     TOTAL ABDOMINAL HYSTERECTOMY      Current Outpatient Medications  Medication Sig Dispense Refill   albuterol (VENTOLIN HFA) 108 (90 Base) MCG/ACT inhaler Inhale 2 puffs into the lungs every 6 (six) hours as needed for wheezing or shortness of breath.     apixaban (ELIQUIS) 2.5 MG TABS tablet TAKE 1 TABLET BY MOUTH TWICE DAILY 60 tablet 11   Continuous Blood Gluc Receiver (FREESTYLE LIBRE 2 READER) DEVI As directed 1 each 0   Continuous Blood Gluc Sensor (FREESTYLE LIBRE 2 SENSOR) MISC 1 Piece by Does not apply route every 14 (fourteen) days. 2 each 3   glipiZIDE (GLUCOTROL XL) 2.5 MG 24 hr tablet TAKE 1 TABLET BY MOUTH EVERY DAY WITH BREAKFAST 90 tablet 1   GLOBAL EASE INJECT PEN NEEDLES 31G X 5 MM MISC USE ONE TIP WITH INSULIN AT BEDTIME 100 each 3   insulin glargine (LANTUS SOLOSTAR) 100 UNIT/ML Solostar Pen Inject 18 Units into the skin at bedtime. 15 mL 1   loperamide (IMODIUM) 2 MG capsule Take 1 mg by mouth as needed for diarrhea or loose stools.     metoprolol tartrate (LOPRESSOR) 25 MG tablet Take 12.5 mg by mouth 2 (two) times daily.     nitroGLYCERIN (NITROSTAT) 0.4 MG SL tablet PLACE 1 TABLET UNDER THE TONGUE EVERY 5 MINUTES FOR 3 DOSES AS NEEDED FOR CHEST PAIN. IF YOU HAVE NO RELIEF AFTER THE 3RD DOSE PROCEED TO THE ED FOR AN EVALUATION 90 tablet 3   ondansetron (ZOFRAN-ODT) 4 MG disintegrating tablet Take  4 mg by mouth every 8 (eight) hours as needed for nausea or vomiting.      pantoprazole (PROTONIX) 40 MG tablet TAKE 1 TABLET BY MOUTH TWICE DAILY 60 tablet 1   simvastatin (ZOCOR) 20 MG tablet Take 20 mg by mouth daily.      amLODipine (NORVASC) 2.5 MG tablet Take 1 tablet (2.5 mg total) by mouth every evening. 90 tablet 1   No current facility-administered medications for this visit.   Allergies:  Adhesive [tape], Demerol [meperidine], Iohexol, Meperidine hcl, Shellfish allergy, and Penicillins   ROS: No orthopnea or PND.  Physical Exam: VS:  BP (!) 156/78   Pulse 64   Ht '5\' 2"'$  (1.575 m)   Wt 174 lb 6.4 oz (79.1 kg)   SpO2 98%   BMI 31.90 kg/m , BMI Body mass index is 31.9 kg/m.  Wt Readings from Last 3 Encounters:  03/13/22 174 lb 6.4 oz (79.1 kg)  11/22/21 182 lb 3.2 oz (82.6 kg)  09/27/21 181 lb 9.6 oz (82.4 kg)    General: Patient appears comfortable at rest. HEENT: Conjunctiva and  lids normal. Neck: Supple, no elevated JVP or carotid bruits. Lungs: Clear to auscultation, nonlabored breathing at rest. Cardiac: Irregularly irregular, 2/6 systolic murmur at the base, no gallop. Extremities: Stable mild ankle edema.  ECG:  An ECG dated 09/27/2021 was personally reviewed today and demonstrated:  Atrial fibrillation with low voltage.  Recent Labwork: 05/16/2021: ALT 11; AST 16; BUN 20; Creatinine, Ser 1.49; Potassium 4.9; Sodium 141; TSH 4.080     Component Value Date/Time   CHOL 132 05/16/2021 0917   TRIG 94 05/16/2021 0917   HDL 45 05/16/2021 0917   CHOLHDL 2.9 05/16/2021 0917   CHOLHDL 3.2 10/15/2019 0828   VLDL 35 07/11/2009 2342   LDLCALC 69 05/16/2021 0917   LDLCALC 66 10/15/2019 0828  October 2023: Potassium 4.5, BUN 29, creatinine 1.56, magnesium 1.6, hemoglobin 12.0  Other Studies Reviewed Today:  Echocardiogram 06/22/2018 [facility]): Normal LV wall thickness with mild chamber dilatation and LVEF 80%, mild left atrial enlargement, normal RV contraction,  mild mitral regurgitation, sclerotic aortic valve with trace aortic regurgitation, mild tricuspid regurgitation with estimated PASP 21 mmHg, trivial pericardial effusion.  Assessment and Plan:  1.  Permanent atrial fibrillation with CHA2DS2-VASc score of 7.  Continue Eliquis at current dose for stroke prophylaxis, I reviewed her lab work from October.  Agree with reduction in Lopressor 12.5 mg twice daily based on current heart rate.  2.  Systolic murmur with history of aortic valve sclerosis by echocardiogram in 2020.  Updated study will be obtained.  3.  Mixed hyperlipidemia, on Zocor.  Last LDL 69 in February.  4.  Essential hypertension, also on Norvasc which was reduced to 2.5 mg daily by PCP in October.  I have asked her to move this dose to the evening instead of the morning.  Continue to track blood pressure in case further adjustments are needed.  Medication Adjustments/Labs and Tests Ordered: Current medicines are reviewed at length with the patient today.  Concerns regarding medicines are outlined above.   Tests Ordered: Orders Placed This Encounter  Procedures   Ambulatory Referral to Primary Care   ECHOCARDIOGRAM COMPLETE    Medication Changes: Meds ordered this encounter  Medications   amLODipine (NORVASC) 2.5 MG tablet    Sig: Take 1 tablet (2.5 mg total) by mouth every evening.    Dispense:  90 tablet    Refill:  1    Disposition:  Follow up  6 months.  Signed, [name], MD, [facility] 03/13/2022 9:29 AM    [facility] [address], [place], [place] [address] Phone: [phone]; Fax: [phone]

## 2022-03-13 ENCOUNTER — Encounter: Payer: Self-pay | Admitting: Cardiology

## 2022-03-13 ENCOUNTER — Ambulatory Visit: Payer: Medicare Other | Attending: Cardiology | Admitting: Cardiology

## 2022-03-13 VITALS — BP 156/78 | HR 64 | Ht 62.0 in | Wt 174.4 lb

## 2022-03-13 DIAGNOSIS — I10 Essential (primary) hypertension: Secondary | ICD-10-CM

## 2022-03-13 DIAGNOSIS — R011 Cardiac murmur, unspecified: Secondary | ICD-10-CM | POA: Diagnosis not present

## 2022-03-13 DIAGNOSIS — I4821 Permanent atrial fibrillation: Secondary | ICD-10-CM | POA: Diagnosis not present

## 2022-03-13 DIAGNOSIS — E782 Mixed hyperlipidemia: Secondary | ICD-10-CM | POA: Diagnosis not present

## 2022-03-13 MED ORDER — AMLODIPINE BESYLATE 2.5 MG PO TABS: 2.5000 mg | ORAL_TABLET | Freq: Every evening | ORAL | 1 refills | Status: DC

## 2022-03-13 NOTE — Patient Instructions (Addendum)
Medication Instructions:  Your physician has recommended you make the following change in your medication:  Take amlodipine 2.5 mg in the evening Continue other medications the same  Labwork: none  Testing/Procedures: Your physician has requested that you have an echocardiogram. Echocardiography is a painless test that uses sound waves to create images of your heart. It provides your doctor with information about the size and shape of your heart and how well your heart's chambers and valves are working. This procedure takes approximately one hour. There are no restrictions for this procedure. Please do NOT wear cologne, perfume, aftershave, or lotions (deodorant is allowed). Please arrive 15 minutes prior to your appointment time.  Follow-Up: Your physician recommends that you schedule a follow-up appointment in: 6 months  Any Other Special Instructions Will Be Listed Below (If Applicable). You have been referred to [facility] [phone], [phone], [phone])  If you need a refill on your cardiac medications before your next appointment, please call your pharmacy.

## 2022-03-25 ENCOUNTER — Other Ambulatory Visit: Payer: Medicare Other

## 2022-03-26 ENCOUNTER — Other Ambulatory Visit: Payer: Self-pay | Admitting: "Endocrinology

## 2022-03-27 ENCOUNTER — Ambulatory Visit: Payer: Medicare Other | Attending: Cardiology

## 2022-03-27 DIAGNOSIS — R011 Cardiac murmur, unspecified: Secondary | ICD-10-CM | POA: Diagnosis not present

## 2022-03-28 LAB — ECHOCARDIOGRAM COMPLETE
AR max vel: 1.69 cm2
AV Area VTI: 1.82 cm2
AV Area mean vel: 1.67 cm2
AV Mean grad: 7.7 mmHg
AV Peak grad: 13.9 mmHg
Ao pk vel: 1.86 m/s
Calc EF: 78.4 %
MV M vel: 3.95 m/s
MV Peak grad: 62.3 mmHg
S' Lateral: 3.1 cm
Single Plane A2C EF: 74.8 %
Single Plane A4C EF: 82.5 %

## 2022-04-02 ENCOUNTER — Ambulatory Visit: Payer: Medicare Other | Admitting: Family Medicine

## 2022-04-03 ENCOUNTER — Other Ambulatory Visit: Payer: Self-pay | Admitting: "Endocrinology

## 2022-04-03 ENCOUNTER — Other Ambulatory Visit: Payer: Self-pay | Admitting: Cardiology

## 2022-04-03 ENCOUNTER — Telehealth: Payer: Self-pay | Admitting: Cardiology

## 2022-04-03 DIAGNOSIS — N183 Chronic kidney disease, stage 3 unspecified: Secondary | ICD-10-CM

## 2022-04-03 NOTE — Telephone Encounter (Signed)
[name], MD  Merlene Laughter, RN Results reviewed.  Follow-up echocardiogram shows vigorous LVEF at 65 to 70%.  There is mild mitral regurgitation, mild calcification of the aortic valve but no stenosis.  RVSP estimated 45 mmHg (mildly to moderately increased).  Would continue with current follow-up plan.    Results discussed with patient, copied pcp

## 2022-04-03 NOTE — Telephone Encounter (Signed)
Patient is returning call to discuss echo results. °

## 2022-04-04 ENCOUNTER — Ambulatory Visit (INDEPENDENT_AMBULATORY_CARE_PROVIDER_SITE_OTHER): Payer: Medicare Other | Admitting: Family Medicine

## 2022-04-04 ENCOUNTER — Encounter: Payer: Self-pay | Admitting: Family Medicine

## 2022-04-04 VITALS — BP 132/78 | HR 71 | Ht 62.0 in | Wt 167.0 lb

## 2022-04-04 DIAGNOSIS — E559 Vitamin D deficiency, unspecified: Secondary | ICD-10-CM

## 2022-04-04 DIAGNOSIS — Z0001 Encounter for general adult medical examination with abnormal findings: Secondary | ICD-10-CM

## 2022-04-04 DIAGNOSIS — E785 Hyperlipidemia, unspecified: Secondary | ICD-10-CM

## 2022-04-04 DIAGNOSIS — R7301 Impaired fasting glucose: Secondary | ICD-10-CM | POA: Diagnosis not present

## 2022-04-04 DIAGNOSIS — F419 Anxiety disorder, unspecified: Secondary | ICD-10-CM

## 2022-04-04 DIAGNOSIS — Z114 Encounter for screening for human immunodeficiency virus [HIV]: Secondary | ICD-10-CM

## 2022-04-04 DIAGNOSIS — Z1159 Encounter for screening for other viral diseases: Secondary | ICD-10-CM | POA: Diagnosis not present

## 2022-04-04 DIAGNOSIS — E039 Hypothyroidism, unspecified: Secondary | ICD-10-CM

## 2022-04-04 DIAGNOSIS — I10 Essential (primary) hypertension: Secondary | ICD-10-CM | POA: Diagnosis not present

## 2022-04-04 MED ORDER — BUSPIRONE HCL 5 MG PO TABS: 5.0000 mg | ORAL_TABLET | Freq: Once | ORAL | 0 refills | Status: AC

## 2022-04-04 NOTE — Patient Instructions (Addendum)
Please Follow up in 3 weeks to address chronic lower back pain. Please return to have your blood drawn for routine labs Please take anxiety medication as ordered  Happy New Years!
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
## 2022-04-04 NOTE — Progress Notes (Signed)
Complete physical exam  Patient: SHASTA CHINN   DOB: 19-Sep-1934   87 y.o. Female  MRN: [id number]  Subjective:    Chief Complaint  Patient presents with   Establish Care   Anxiety   Hypertension   Hyperlipidemia   Diabetes    Patient states she was in the hospital in October and they took her off [place] and some others. And now she feels like her sugar is too low in the mornings. Says she starts to black out after she takes her morning meds.     ELAYNAH VIRGINIA is a 87 y.o. female who presents today for a complete physical exam. She reports consuming a general diet. The patient does not participate in regular exercise at present. She generally feels fairly well. She reports sleeping poorly. She does have additional problems to discuss today in regards to her back pain and anxiety.   Most recent fall risk assessment:    04/04/2022   11:10 AM  [place] in the past year? 1  Number falls in past yr: 1  Injury with Fall? 0     Most recent depression screenings:    04/04/2022   11:11 AM 09/10/2017    3:25 PM  PHQ 2/9 Scores  PHQ - 2 Score 3 1  PHQ- 9 Score 15     Vision:Within last year  Patient Active Problem List   Diagnosis Date Noted   Encounter for routine adult physical exam with abnormal findings 04/06/2022   Class 1 obesity due to excess calories with serious comorbidity and body mass index (BMI) of 34.0 to 34.9 in adult 11/22/2021   Hypothyroidism 05/24/2021   Constipation 09/24/2018   Alternating constipation and diarrhea 11/07/2017   Rectal bleeding 11/07/2017   RLQ abdominal pain 81/01/3158   Diabetic complication ([place]) 45/85/9292   Mucosal abnormality of stomach    Diverticulosis of colon without hemorrhage    Dysphagia, pharyngoesophageal phase    Esophageal dysphagia 12/02/2014   LLQ pain 02/22/2014   Abdominal pain, epigastric 02/22/2014   Adenomatous polyps 11/21/2011   Long term (current) use of anticoagulants 07/03/2010    Permanent atrial fibrillation ([place]) 08/04/2009   RENAL DISEASE, CHRONIC, STAGE III 08/04/2009   IBS 04/14/2009   Diabetes mellitus with stage 3 chronic kidney disease ([place]) 02/17/2009   Anxiety 02/17/2009   GERD 02/17/2009   DIARRHEA, CHRONIC 02/17/2009   Mixed hyperlipidemia 12/21/2008   Essential hypertension, benign 12/21/2008      Patient Care Team: Del Eli Hose, FNP as PCP - General (Family Medicine) [name], MD as PCP - Cardiology (Cardiology) [name], MD (Gastroenterology) [name], MD (Inactive) as Consulting Physician (Nephrology)   Outpatient Medications Prior to Visit  Medication Sig   albuterol (VENTOLIN HFA) 108 (90 Base) MCG/ACT inhaler Inhale 2 puffs into the lungs every 6 (six) hours as needed for wheezing or shortness of breath.   amLODipine (NORVASC) 2.5 MG tablet Take 1 tablet (2.5 mg total) by mouth every evening.   apixaban (ELIQUIS) 2.5 MG TABS tablet TAKE 1 TABLET BY MOUTH TWICE DAILY   Continuous Blood Gluc Receiver (FREESTYLE LIBRE 2 READER) DEVI As directed   Continuous Blood Gluc Sensor (FREESTYLE LIBRE 2 SENSOR) MISC 1 Piece by Does not apply route every 14 (fourteen) days.   glipiZIDE (GLUCOTROL XL) 2.5 MG 24 hr tablet TAKE 1 TABLET BY MOUTH EVERY DAY WITH BREAKFAST   GLOBAL EASE INJECT PEN NEEDLES 31G X 5 MM MISC  USE ONE TIP WITH INSULIN AT BEDTIME   LANTUS SOLOSTAR 100 UNIT/ML Solostar Pen INJECT 24 UNITS INTO THE SKIN AT BEDTIME   loperamide (IMODIUM) 2 MG capsule Take 1 mg by mouth as needed for diarrhea or loose stools.   metoprolol tartrate (LOPRESSOR) 25 MG tablet TAKE 1/2 TABLET BY MOUTH TWICE DAILY   nitroGLYCERIN (NITROSTAT) 0.4 MG SL tablet PLACE 1 TABLET UNDER THE TONGUE EVERY 5 MINUTES FOR 3 DOSES AS NEEDED FOR CHEST PAIN. IF YOU HAVE NO RELIEF AFTER THE 3RD DOSE PROCEED TO THE ED FOR AN EVALUATION   ondansetron (ZOFRAN-ODT) 4 MG disintegrating tablet Take 4 mg by mouth every 8 (eight) hours as needed  for nausea or vomiting.    pantoprazole (PROTONIX) 40 MG tablet TAKE 1 TABLET BY MOUTH TWICE DAILY   simvastatin (ZOCOR) 20 MG tablet Take 20 mg by mouth daily.    No facility-administered medications prior to visit.    Review of Systems  Constitutional:  Negative for chills, diaphoresis, fever, malaise/fatigue and weight loss.  HENT:  Negative for congestion, ear discharge, ear pain, hearing loss, nosebleeds, sinus pain, sore throat and tinnitus.   Eyes:  Negative for blurred vision, double vision, photophobia, pain, discharge and redness.  Respiratory:  Negative for cough, hemoptysis, sputum production, shortness of breath, wheezing and stridor.   Cardiovascular:  Negative for chest pain, palpitations, orthopnea, claudication, leg swelling and PND.  Gastrointestinal:  Positive for heartburn. Negative for abdominal pain, blood in stool, constipation, diarrhea, melena, nausea and vomiting.  Genitourinary:  Negative for dysuria, flank pain, frequency, hematuria and urgency.  Musculoskeletal:  Positive for back pain. Negative for falls, joint pain, myalgias and neck pain.  Skin:  Negative for itching and rash.       Upper back two hard nodules   Neurological:  Negative for dizziness, tingling, tremors, sensory change, speech change, focal weakness, seizures, loss of consciousness, weakness and headaches.  Endo/Heme/Allergies:  Negative for environmental allergies and polydipsia. Does not bruise/bleed easily.  Psychiatric/Behavioral:  Negative for depression, hallucinations, memory loss, substance abuse and suicidal ideas. The patient is nervous/anxious. The patient does not have insomnia.           Objective:     BP 132/78   Pulse 71   Ht _0  (1.575 m)   Wt 167 lb (75.8 kg)   SpO2 95%   BMI 30.54 kg/m  BP Readings from Last 3 Encounters:  04/04/22 132/78  03/13/22 (!) 156/78  11/22/21 134/66      Physical Exam Vitals reviewed.  Constitutional:      Appearance: Normal  appearance.  HENT:     Head: Normocephalic.     Right Ear: Tympanic membrane normal.     Left Ear: Tympanic membrane normal.     Nose: Nose normal.     Mouth/Throat:     Mouth: Mucous membranes are moist.     Pharynx: Oropharynx is clear.  Eyes:     Pupils: Pupils are equal, round, and reactive to light.  Cardiovascular:     Rate and Rhythm: Normal rate.     Pulses: Normal pulses.  Pulmonary:     Effort: Pulmonary effort is normal.     Breath sounds: Normal breath sounds.  Abdominal:     General: Abdomen is flat. Bowel sounds are normal.  Musculoskeletal:        General: No tenderness.     Cervical back: Normal range of motion.     Left lower leg: Edema present.  Skin:    General: Skin is warm and dry.     Capillary Refill: Capillary refill takes less than 2 seconds.  Neurological:     General: No focal deficit present.     Mental Status: She is alert.  Psychiatric:     Comments: Has been feeling anxious for the past few months due to her older age and still grieving her husband death      No results found for any visits on 04/04/22. Last CBC Lab Results  Component Value Date   WBC 8.1 11/01/2017   HGB 10.4 (L) 11/01/2017   HCT 34.9 (L) 11/01/2017   MCV 76.7 (L) 11/01/2017   MCH 22.9 (L) 11/01/2017   RDW 16.9 (H) 11/01/2017   PLT 286 76/16/0737   Last metabolic panel Lab Results  Component Value Date   GLUCOSE 156 (H) 05/16/2021   NA 141 05/16/2021   K 4.9 05/16/2021   CL 104 05/16/2021   CO2 23 05/16/2021   BUN 20 05/16/2021   CREATININE 1.49 (H) 05/16/2021   EGFR 34 (L) 05/16/2021   CALCIUM 9.4 05/16/2021   PHOS 3.5 02/25/2017   PROT 6.9 05/16/2021   ALBUMIN 4.2 05/16/2021   LABGLOB 2.7 05/16/2021   AGRATIO 1.6 05/16/2021   BILITOT 0.4 05/16/2021   ALKPHOS 95 05/16/2021   AST 16 05/16/2021   ALT 11 05/16/2021   ANIONGAP 7 11/01/2017   Last lipids Lab Results  Component Value Date   CHOL 132 05/16/2021   HDL 45 05/16/2021   LDLCALC 69  05/16/2021   TRIG 94 05/16/2021   CHOLHDL 2.9 05/16/2021   Last hemoglobin A1c Lab Results  Component Value Date   HGBA1C 6.7 11/22/2021   Last thyroid functions Lab Results  Component Value Date   TSH 4.080 05/16/2021   Last vitamin D Lab Results  Component Value Date   VD25OH 62 10/15/2019   Last vitamin B12 and Folate Lab Results  Component Value Date   VITAMINB12 617 07/11/2009   FOLATE  07/11/2009    18.3 (NOTE)  Reference Ranges        Deficient:       0.4 - 3.3 ng/mL        Indeterminate:   3.4 - 5.4 ng/mL        Normal:              > 5.4 ng/mL        Assessment & Plan:    Routine Health Maintenance and Physical Exam  Immunization History  Administered Date(s) Administered   Influenza,inj,Quad PF,6+ Mos 01/28/2017, 01/09/2018    Health Maintenance  Topic Date Due   COVID-19 Vaccine (1) Never done   DTaP/Tdap/Td (1 - Tdap) Never done   Zoster Vaccines- Shingrix (1 of 2) Never done   Pneumonia Vaccine 63+ Years old (1 - PCV) Never done   Diabetic kidney evaluation - Urine ACR  10/14/2020   OPHTHALMOLOGY EXAM  04/19/2022   Diabetic kidney evaluation - eGFR measurement  05/16/2022   HEMOGLOBIN A1C  05/25/2022   Medicare Annual Wellness (AWV)  01/31/2023   FOOT EXAM  04/05/2023   INFLUENZA VACCINE  Completed   DEXA SCAN  Completed   HPV VACCINES  Aged Out    Discussed health benefits of physical activity, and encouraged her to engage in regular exercise appropriate for her age and condition.  Problem List Items Addressed This Visit       Endocrine   Hypothyroidism   Relevant Orders  TSH + free T4     Other   Anxiety    Has been feeling anxious for the past few months due to her older age and still grieving her husband death. Prescribed Buspar 5 mg for her anxiety       Encounter for routine adult physical exam with abnormal findings    Routine Physical Exam done, Advise patient to return to have her blood work drawn.      Other Visit  Diagnoses     Primary hypertension    -  Primary   Relevant Orders   Microalbumin / creatinine urine ratio   CMP14+EGFR   CBC with Differential/Platelet   IFG (impaired fasting glucose)       Relevant Orders   Hemoglobin A1c   Need for hepatitis C screening test       Relevant Orders   Hepatitis C antibody   Screening for HIV (human immunodeficiency virus)       Relevant Orders   HIV Antibody (routine testing w rflx)   Vitamin D deficiency       Relevant Orders   VITAMIN D 25 Hydroxy (Vit-D Deficiency, Fractures)   Hyperlipidemia, unspecified hyperlipidemia type       Relevant Orders   Lipid panel      No follow-ups on file.     Renard Hamper Ria Comment, FNP

## 2022-04-06 DIAGNOSIS — Z0001 Encounter for general adult medical examination with abnormal findings: Secondary | ICD-10-CM | POA: Insufficient documentation

## 2022-04-06 NOTE — Assessment & Plan Note (Signed)
Has been feeling anxious for the past few months due to her older age and still grieving her husband death. Prescribed Buspar 5 mg for her anxiety

## 2022-04-06 NOTE — Assessment & Plan Note (Signed)
Routine Physical Exam done, Advise patient to return to have her blood work drawn.

## 2022-04-23 ENCOUNTER — Other Ambulatory Visit: Payer: Self-pay | Admitting: Internal Medicine

## 2022-05-08 ENCOUNTER — Ambulatory Visit: Payer: Medicare Other | Admitting: Internal Medicine

## 2022-05-08 ENCOUNTER — Ambulatory Visit: Payer: Medicare Other | Admitting: Family Medicine

## 2022-05-10 ENCOUNTER — Other Ambulatory Visit (HOSPITAL_COMMUNITY): Payer: Self-pay

## 2022-05-21 ENCOUNTER — Other Ambulatory Visit: Payer: Self-pay | Admitting: *Deleted

## 2022-05-21 DIAGNOSIS — E039 Hypothyroidism, unspecified: Secondary | ICD-10-CM

## 2022-05-21 DIAGNOSIS — E1122 Type 2 diabetes mellitus with diabetic chronic kidney disease: Secondary | ICD-10-CM

## 2022-05-22 ENCOUNTER — Ambulatory Visit (INDEPENDENT_AMBULATORY_CARE_PROVIDER_SITE_OTHER): Payer: Medicare Other | Admitting: Family Medicine

## 2022-05-22 ENCOUNTER — Encounter: Payer: Self-pay | Admitting: Family Medicine

## 2022-05-22 VITALS — BP 176/86 | HR 92 | Wt 166.0 lb

## 2022-05-22 DIAGNOSIS — I10 Essential (primary) hypertension: Secondary | ICD-10-CM

## 2022-05-22 DIAGNOSIS — Z23 Encounter for immunization: Secondary | ICD-10-CM | POA: Diagnosis not present

## 2022-05-22 DIAGNOSIS — E119 Type 2 diabetes mellitus without complications: Secondary | ICD-10-CM

## 2022-05-22 DIAGNOSIS — M545 Low back pain, unspecified: Secondary | ICD-10-CM | POA: Diagnosis not present

## 2022-05-22 DIAGNOSIS — R0602 Shortness of breath: Secondary | ICD-10-CM

## 2022-05-22 DIAGNOSIS — G8929 Other chronic pain: Secondary | ICD-10-CM | POA: Insufficient documentation

## 2022-05-22 MED ORDER — ALBUTEROL SULFATE HFA 108 (90 BASE) MCG/ACT IN AERS: 2.0000 | INHALATION_SPRAY | Freq: Four times a day (QID) | RESPIRATORY_TRACT | 2 refills | Status: DC | PRN

## 2022-05-22 MED ORDER — AMLODIPINE BESYLATE 5 MG PO TABS: 5.0000 mg | ORAL_TABLET | Freq: Every day | ORAL | 1 refills | Status: DC

## 2022-05-22 MED ORDER — GABAPENTIN 300 MG PO CAPS: 300.0000 mg | ORAL_CAPSULE | Freq: Two times a day (BID) | ORAL | 2 refills | Status: DC | PRN

## 2022-05-22 MED ORDER — FUROSEMIDE 20 MG PO TABS: 10.0000 mg | ORAL_TABLET | Freq: Every day | ORAL | 3 refills | Status: DC

## 2022-05-22 NOTE — Assessment & Plan Note (Addendum)
Blood pressure not controlled. Increased amlodipine to 5 mg, continue lopressor 25 mg and simvastatin 20 mg. Discussed medication desired effects, potential side effects. Non Pharmacological interventions such as low salt, DASH diet discussed. Educated on stress reduction and physical activity. Discussed signs and symptoms of major cardiovascular event and need to present to the ED. Follow up in 1 month or sooner if needed with blood pressure reading log to evualte B/P trends and potential increasing amlodipine dose. Patient verbalizes understanding regarding plan of care and all questions answered.

## 2022-05-22 NOTE — Progress Notes (Signed)
Patient Office Visit   Subjective   Patient ID: [name], female    DOB: 1935-01-20  Age: 88 y.o. MRN: [id number]  CC:  Chief Complaint  Patient presents with   Back Pain    Patient is here for f/u for chronic back pain.     HPI [name]  88 year old female, presents to the for hypertension management and chronic back pain. She  has a past medical history of Anxiety, Asthma, Chronic back pain, Colonoscopy causing post-procedural bleeding, Diarrhea, Esophageal reflux, Essential hypertension, Fibromyalgia, Hiatal hernia, Hiatal hernia, Mixed hyperlipidemia, Nephrolithiasis, OSA (obstructive sleep apnea), Permanent atrial fibrillation (HCC), Type 2 diabetes mellitus ([place]), Ureteral stent retained, and Ventral hernia.  Patient here for follow-up of elevated blood pressure. She is not exercising and is not adherent to low salt diet.  Blood pressure is not well controlled at home. Cardiac symptoms fatigue, dyspnea, and lower extremity edema. Patient denies chest pressure/discomfort, claudication, near-syncope, syncope, and tachypnea.  Cardiovascular risk factors included advanced age  diabetes mellitus, hypertension, microalbuminuria, and sedentary lifestyle.   Back Pain This is a chronic problem. The current episode started more than 1 year ago. The problem occurs intermittently. The problem has been waxing and waning since onset. The pain is present in the lumbar spine. The quality of the pain is described as aching and shooting. The pain radiates to the left thigh and right thigh. The pain is at a severity of 8/10. The pain is moderate. The pain is Worse during the day. The symptoms are aggravated by sitting, bending, twisting and stress. Stiffness is present All day. Associated symptoms include numbness and tingling. Pertinent negatives include no bladder incontinence, bowel incontinence, chest pain, dysuria, fever, pelvic pain or perianal numbness. Risk factors include obesity,  sedentary lifestyle, poor posture and lack of exercise. She has tried ice for the symptoms. The treatment provided mild relief.      Outpatient Encounter Medications as of 05/22/2022  Medication Sig   albuterol (VENTOLIN HFA) 108 (90 Base) MCG/ACT inhaler Inhale 2 puffs into the lungs every 6 (six) hours as needed for wheezing or shortness of breath.   amLODipine (NORVASC) 5 MG tablet Take 1 tablet (5 mg total) by mouth daily.   apixaban (ELIQUIS) 2.5 MG TABS tablet TAKE 1 TABLET BY MOUTH TWICE DAILY   busPIRone (BUSPAR) 5 MG tablet Take 5 mg by mouth once.   Continuous Blood Gluc Receiver (FREESTYLE LIBRE 2 READER) DEVI As directed   Continuous Blood Gluc Sensor (FREESTYLE LIBRE 2 SENSOR) MISC 1 Piece by Does not apply route every 14 (fourteen) days.   furosemide (LASIX) 20 MG tablet Take 0.5 tablets (10 mg total) by mouth daily.   gabapentin (NEURONTIN) 300 MG capsule Take 1 capsule (300 mg total) by mouth 2 (two) times daily as needed.   glipiZIDE (GLUCOTROL XL) 2.5 MG 24 hr tablet TAKE 1 TABLET BY MOUTH EVERY DAY WITH BREAKFAST   GLOBAL EASE INJECT PEN NEEDLES 31G X 5 MM MISC USE ONE TIP WITH INSULIN AT BEDTIME   LANTUS SOLOSTAR 100 UNIT/ML Solostar Pen INJECT 24 UNITS INTO THE SKIN AT BEDTIME   loperamide (IMODIUM) 2 MG capsule Take 1 mg by mouth as needed for diarrhea or loose stools.   metoprolol tartrate (LOPRESSOR) 25 MG tablet TAKE 1/2 TABLET BY MOUTH TWICE DAILY   nitroGLYCERIN (NITROSTAT) 0.4 MG SL tablet PLACE 1 TABLET UNDER THE TONGUE EVERY 5 MINUTES FOR 3 DOSES AS NEEDED FOR CHEST PAIN. IF YOU  HAVE NO RELIEF AFTER THE 3RD DOSE PROCEED TO THE ED FOR AN EVALUATION   ondansetron (ZOFRAN-ODT) 4 MG disintegrating tablet Take 4 mg by mouth every 8 (eight) hours as needed for nausea or vomiting.    pantoprazole (PROTONIX) 40 MG tablet TAKE 1 TABLET BY MOUTH TWICE DAILY   simvastatin (ZOCOR) 20 MG tablet Take 20 mg by mouth daily.    VICTOZA 18 MG/3ML SOPN INJECT 1.2 MG INTO THE SKIN  DAILY   [DISCONTINUED] albuterol (VENTOLIN HFA) 108 (90 Base) MCG/ACT inhaler Inhale 2 puffs into the lungs every 6 (six) hours as needed for wheezing or shortness of breath.   [DISCONTINUED] amLODipine (NORVASC) 2.5 MG tablet Take 1 tablet (2.5 mg total) by mouth every evening.   No facility-administered encounter medications on file as of 05/22/2022.    Past Surgical History:  Procedure Laterality Date   AGILE CAPSULE N/A 08/17/2020   Procedure: AGILE CAPSULE;  Surgeon: [name], MD;  Location: [facility];  Service: Endoscopy;  Laterality: N/A;  7:30am   BREAST LUMPECTOMY     benign cyst   CATARACT EXTRACTION W/PHACO Left 08/17/2012   Procedure: CATARACT EXTRACTION PHACO AND INTRAOCULAR LENS PLACEMENT ([place]);  Surgeon: [name], MD;  Location: [facility];  Service: Ophthalmology;  Laterality: Left;  CDE:18.73   CATARACT EXTRACTION W/PHACO Right 08/13/2019   Procedure: CATARACT EXTRACTION PHACO AND INTRAOCULAR LENS PLACEMENT ([facility]);  Surgeon: [name], MD;  Location: [facility];  Service: Ophthalmology;  Laterality: Right;  CDE: 7.14   COLECTOMY     2005. Dr. Geoffry Paradise for diverticulitis   COLONOSCOPY  02/22/09   normal/left-sided diverticula/multiple colonic polyp, adenomatous   COLONOSCOPY  12/16/2011   colonic polyps-treated as described above. Status postprior sigmoid resection. adenomatous. next TCS 12/2014   COLONOSCOPY N/A 12/28/2014   Status post prior segmental resection. Few residual colonic diverticula- status post segmental biopsy negative random colon biopsies   ESOPHAGEAL DILATION N/A 12/28/2014   Procedure: ESOPHAGEAL DILATION;  Surgeon: [name], MD;  Location: [facility];  Service: Endoscopy;  Laterality: N/A;   ESOPHAGOGASTRODUODENOSCOPY  02/22/09   normal s/p dilator;small HH/one large pedunculates polyp  s/p clipping, hyperplastic   ESOPHAGOGASTRODUODENOSCOPY  12/16/2011   [name]: small hiatal hernia. Hyperplastic apperating polyps. Status post [name] dilation  as described above.   ESOPHAGOGASTRODUODENOSCOPY N/A 12/28/2014   RMR: Normal-appearing esophagus status post passage of a maloney dilator. Hiatal hernia. abnormal gastic mucosa of uncertain significance status post gastric biopsy with mild chronic gastritis, no H pylori.    TONSILLECTOMY     TOTAL ABDOMINAL HYSTERECTOMY      Review of Systems  Constitutional:  Negative for chills and fever.  Respiratory:  Positive for shortness of breath. Negative for cough.   Cardiovascular:  Positive for leg swelling. Negative for chest pain, palpitations, orthopnea, claudication and PND.  Gastrointestinal:  Negative for bowel incontinence.  Genitourinary:  Negative for bladder incontinence, dysuria and pelvic pain.  Musculoskeletal:  Positive for back pain.  Neurological:  Positive for tingling and numbness.      Objective    BP (!) 176/86   Pulse 92   Wt 166 lb (75.3 kg)   SpO2 94%   BMI 30.36 kg/m   Physical Exam Constitutional:      General: She is not in acute distress. Cardiovascular:     Rate and Rhythm: Normal rate.     Pulses: Normal pulses.  Pulmonary:     Effort: Pulmonary effort is normal.  Breath sounds: Normal breath sounds.  Musculoskeletal:     Cervical back: Normal range of motion and neck supple.     Right lower leg: Edema present.     Left lower leg: Edema present.  Skin:    General: Skin is warm and dry.     Capillary Refill: Capillary refill takes less than 2 seconds.  Neurological:     General: No focal deficit present.     Mental Status: She is alert.  Psychiatric:        Mood and Affect: Mood normal.       Assessment & Plan:  Primary hypertension Assessment & Plan: Blood pressure not controlled. Increased amlodipine to 5 mg, continue lopressor 25 mg and simvastatin 20 mg. Discussed medication desired effects, potential side effects. Non Pharmacological interventions such as low salt, DASH diet discussed. Educated on stress reduction and physical  activity. Discussed signs and symptoms of major cardiovascular event and need to present to the ED. Follow up in 1 month or sooner if needed with blood pressure reading log to evualte B/P trends and potential increasing amlodipine dose. Patient verbalizes understanding regarding plan of care and all questions answered.   Orders: -     Microalbumin / creatinine urine ratio -     amLODIPine Besylate; Take 1 tablet (5 mg total) by mouth daily.  Dispense: 30 tablet; Refill: 1 -     Furosemide; Take 0.5 tablets (10 mg total) by mouth daily.  Dispense: 30 tablet; Refill: 3  Encounter for diabetes type 2 eye exam ([place]) -     Ambulatory referral to Ophthalmology  Need for pneumococcal 20-valent conjugate vaccination -     Pneumococcal conjugate vaccine 20-valent  Chronic bilateral low back pain, unspecified whether sciatica present Assessment & Plan: Prescribed Gabapentin 300 mg due to neuropathic symptoms. Discussed medication desired effects, potential side effects. Non pharmacological interventions include rest, avoid twisting, straining lower back. Demonstration of proper body mechanics. Alternate ice and heat. Recommend stretching back and legs. Follow up for worsening or persistent symptoms. Patient verbalizes understanding regarding plan of care and all questions answered.   Orders: -     Gabapentin; Take 1 capsule (300 mg total) by mouth 2 (two) times daily as needed.  Dispense: 30 capsule; Refill: 2  SOB (shortness of breath) on exertion -     Albuterol Sulfate HFA; Inhale 2 puffs into the lungs every 6 (six) hours as needed for wheezing or shortness of breath.  Dispense: 8 g; Refill: 2    Return in about 1 month (around 06/20/2022) for re-check blood pressure, hypertension.   Renard Hamper Ria Comment, FNP

## 2022-05-22 NOTE — Patient Instructions (Signed)
It was pleasure meeting with you today. Please follow up in one month for hypertension management with blood pressure reading log Please take medications as prescribed. Follow up with your primary health provider if any health concerns arises. If symptoms worsen please contact your primary care provider and/or visit the emergency department.

## 2022-05-22 NOTE — Assessment & Plan Note (Signed)
Prescribed Gabapentin 300 mg due to neuropathic symptoms. Discussed medication desired effects, potential side effects. Non pharmacological interventions include rest, avoid twisting, straining lower back. Demonstration of proper body mechanics. Alternate ice and heat. Recommend stretching back and legs. Follow up for worsening or persistent symptoms. Patient verbalizes understanding regarding plan of care and all questions answered.

## 2022-05-23 LAB — COMPREHENSIVE METABOLIC PANEL
ALT: 10 IU/L (ref 0–32)
AST: 14 IU/L (ref 0–40)
Albumin/Globulin Ratio: 1.6 (ref 1.2–2.2)
Albumin: 4.5 g/dL (ref 3.7–4.7)
Alkaline Phosphatase: 101 IU/L (ref 44–121)
BUN/Creatinine Ratio: 11 — ABNORMAL LOW (ref 12–28)
BUN: 13 mg/dL (ref 8–27)
Bilirubin Total: 0.8 mg/dL (ref 0.0–1.2)
CO2: 25 mmol/L (ref 20–29)
Calcium: 10.7 mg/dL — ABNORMAL HIGH (ref 8.7–10.3)
Chloride: 98 mmol/L (ref 96–106)
Creatinine, Ser: 1.18 mg/dL — ABNORMAL HIGH (ref 0.57–1.00)
Globulin, Total: 2.8 g/dL (ref 1.5–4.5)
Glucose: 133 mg/dL — ABNORMAL HIGH (ref 70–99)
Potassium: 4.4 mmol/L (ref 3.5–5.2)
Sodium: 137 mmol/L (ref 134–144)
Total Protein: 7.3 g/dL (ref 6.0–8.5)
eGFR: 45 mL/min/{1.73_m2} — ABNORMAL LOW (ref >59)

## 2022-05-23 LAB — T4, FREE: Free T4: 1.58 ng/dL (ref 0.82–1.77)

## 2022-05-23 LAB — LIPID PANEL
Chol/HDL Ratio: 3 ratio (ref 0.0–4.4)
Cholesterol, Total: 154 mg/dL (ref 100–199)
HDL: 52 mg/dL (ref >39)
LDL Chol Calc (NIH): 88 mg/dL (ref 0–99)
Triglycerides: 71 mg/dL (ref 0–149)
VLDL Cholesterol Cal: 14 mg/dL (ref 5–40)

## 2022-05-23 LAB — TSH: TSH: 2.79 u[IU]/mL (ref 0.450–4.500)

## 2022-05-24 LAB — MICROALBUMIN / CREATININE URINE RATIO
Creatinine, Urine: 26.2 mg/dL
Microalb/Creat Ratio: 6377 mg/g creat — ABNORMAL HIGH (ref 0–29)
Microalbumin, Urine: 1670.7 ug/mL

## 2022-05-27 ENCOUNTER — Ambulatory Visit (INDEPENDENT_AMBULATORY_CARE_PROVIDER_SITE_OTHER): Payer: Medicare Other | Admitting: "Endocrinology

## 2022-05-27 ENCOUNTER — Encounter: Payer: Self-pay | Admitting: "Endocrinology

## 2022-05-27 VITALS — BP 146/68 | HR 64 | Ht 62.0 in | Wt 167.0 lb

## 2022-05-27 DIAGNOSIS — N183 Chronic kidney disease, stage 3 unspecified: Secondary | ICD-10-CM | POA: Diagnosis not present

## 2022-05-27 DIAGNOSIS — E039 Hypothyroidism, unspecified: Secondary | ICD-10-CM

## 2022-05-27 DIAGNOSIS — E782 Mixed hyperlipidemia: Secondary | ICD-10-CM

## 2022-05-27 DIAGNOSIS — E1122 Type 2 diabetes mellitus with diabetic chronic kidney disease: Secondary | ICD-10-CM | POA: Diagnosis not present

## 2022-05-27 DIAGNOSIS — I10 Essential (primary) hypertension: Secondary | ICD-10-CM | POA: Diagnosis not present

## 2022-05-27 LAB — POCT GLYCOSYLATED HEMOGLOBIN (HGB A1C): HbA1c, POC (controlled diabetic range): 7.2 % — AB (ref 0.0–7.0)

## 2022-05-27 MED ORDER — GLIPIZIDE ER 5 MG PO TB24: 5.0000 mg | ORAL_TABLET | Freq: Every day | ORAL | 1 refills | Status: DC

## 2022-05-27 MED ORDER — LOSARTAN POTASSIUM-HCTZ 50-12.5 MG PO TABS: 1.0000 | ORAL_TABLET | Freq: Every day | ORAL | 1 refills | Status: DC

## 2022-05-27 MED ORDER — LANTUS SOLOSTAR 100 UNIT/ML ~~LOC~~ SOPN: 16.0000 [IU] | PEN_INJECTOR | Freq: Every day | SUBCUTANEOUS | 1 refills | Status: DC
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
## 2022-05-28 ENCOUNTER — Encounter: Payer: Medicare Other | Admitting: Hematology

## 2022-06-03 ENCOUNTER — Ambulatory Visit: Payer: Medicare Other
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
## 2022-06-07 ENCOUNTER — Encounter: Payer: Medicare Other | Admitting: Hematology

## 2022-06-13 ENCOUNTER — Telehealth: Payer: Self-pay

## 2022-06-13 NOTE — Telephone Encounter (Signed)
Left a message requesting pt return call to the office. ?

## 2022-06-20 ENCOUNTER — Ambulatory Visit (INDEPENDENT_AMBULATORY_CARE_PROVIDER_SITE_OTHER): Payer: Medicare Other | Admitting: Family Medicine

## 2022-06-20 ENCOUNTER — Other Ambulatory Visit: Payer: Self-pay | Admitting: Internal Medicine

## 2022-06-20 ENCOUNTER — Other Ambulatory Visit: Payer: Self-pay | Admitting: Cardiology

## 2022-06-20 ENCOUNTER — Encounter: Payer: Self-pay | Admitting: Family Medicine

## 2022-06-20 VITALS — BP 132/78 | HR 68 | Ht 62.0 in | Wt 164.0 lb

## 2022-06-20 DIAGNOSIS — L439 Lichen planus, unspecified: Secondary | ICD-10-CM | POA: Diagnosis not present

## 2022-06-20 DIAGNOSIS — I10 Essential (primary) hypertension: Secondary | ICD-10-CM | POA: Diagnosis not present

## 2022-06-20 MED ORDER — TRIAMCINOLONE ACETONIDE 0.1 % EX CREA: 1.0000 | TOPICAL_CREAM | Freq: Two times a day (BID) | CUTANEOUS | 0 refills | Status: DC

## 2022-06-20 NOTE — Assessment & Plan Note (Signed)
Patient reports symptoms of constant itch for the past several months. Prescribed Kenalog  cream 0.1 % Advise patient to apply cold , wet cloth or ice pack to the skin that itches, wear loose-fitting , cotton clothing, use fragrance-free lotions, soaps, to minimize irritation.  Referral to dermatology for further evaluation.

## 2022-06-20 NOTE — Progress Notes (Signed)
New Patient Office Visit   Subjective   Patient ID: [name], female    DOB: 03/13/1935  Age: 88 y.o. MRN: [id number]  CC:  Chief Complaint  Patient presents with   Hypertension    Patient is here for HTN f/u. Has been checking it occasionally at home. Kept a log of BP at home, ranging from 106/50-157/85.    HPI [name] 88 year old female presents to the clinic for hypertension follow up She  has a past medical history of Anxiety, Asthma, Chronic back pain, Colonoscopy causing post-procedural bleeding, Diarrhea, Esophageal reflux, Essential hypertension, Fibromyalgia, Hiatal hernia, Hiatal hernia, Mixed hyperlipidemia, Nephrolithiasis, OSA (obstructive sleep apnea), Permanent atrial fibrillation (HCC), Type 2 diabetes mellitus ([place]), Ureteral stent retained, and Ventral hernia.  Hypertension The problem has been gradually improving since onset. The problem is controlled. Pertinent negatives include no chest pain, headaches or shortness of breath. Risk factors for coronary artery disease include diabetes mellitus, dyslipidemia and obesity. The current treatment provides moderate improvement. There are no compliance problems.  Identifiable causes of hypertension include chronic renal disease.    Outpatient Encounter Medications as of 06/20/2022  Medication Sig   albuterol (VENTOLIN HFA) 108 (90 Base) MCG/ACT inhaler Inhale 2 puffs into the lungs every 6 (six) hours as needed for wheezing or shortness of breath.   apixaban (ELIQUIS) 2.5 MG TABS tablet TAKE 1 TABLET BY MOUTH TWICE DAILY   busPIRone (BUSPAR) 5 MG tablet Take 5 mg by mouth once.   Continuous Blood Gluc Receiver (FREESTYLE LIBRE 2 READER) DEVI As directed   Continuous Blood Gluc Sensor (FREESTYLE LIBRE 2 SENSOR) MISC 1 Piece by Does not apply route every 14 (fourteen) days.   gabapentin (NEURONTIN) 300 MG capsule Take 1 capsule (300 mg total) by mouth 2 (two) times daily as needed.   glipiZIDE (GLUCOTROL XL) 5  MG 24 hr tablet Take 1 tablet (5 mg total) by mouth daily with breakfast.   GLOBAL EASE INJECT PEN NEEDLES 31G X 5 MM MISC USE ONE TIP WITH INSULIN AT BEDTIME   insulin glargine (LANTUS SOLOSTAR) 100 UNIT/ML Solostar Pen Inject 16 Units into the skin at bedtime.   loperamide (IMODIUM) 2 MG capsule Take 1 mg by mouth as needed for diarrhea or loose stools.   losartan-hydrochlorothiazide (HYZAAR) 50-12.5 MG tablet Take 1 tablet by mouth daily.   metoprolol tartrate (LOPRESSOR) 25 MG tablet TAKE 1/2 TABLET BY MOUTH TWICE DAILY   nitroGLYCERIN (NITROSTAT) 0.4 MG SL tablet PLACE 1 TABLET UNDER THE TONGUE EVERY 5 MINUTES FOR 3 DOSES AS NEEDED FOR CHEST PAIN. IF YOU HAVE NO RELIEF AFTER THE 3RD DOSE PROCEED TO THE ED FOR AN EVALUATION   ondansetron (ZOFRAN-ODT) 4 MG disintegrating tablet Take 4 mg by mouth every 8 (eight) hours as needed for nausea or vomiting.    pantoprazole (PROTONIX) 40 MG tablet TAKE 1 TABLET BY MOUTH TWICE DAILY   simvastatin (ZOCOR) 20 MG tablet Take 20 mg by mouth daily.    triamcinolone cream (KENALOG) 0.1 % Apply 1 Application topically 2 (two) times daily.   [DISCONTINUED] amLODipine (NORVASC) 5 MG tablet Take 5 mg by mouth daily.   [DISCONTINUED] furosemide (LASIX) 20 MG tablet Take 0.5 tablets (10 mg total) by mouth daily.   No facility-administered encounter medications on file as of 06/20/2022.    Past Surgical History:  Procedure Laterality Date   AGILE CAPSULE N/A 08/17/2020   Procedure: AGILE CAPSULE;  Surgeon: [name], MD;  [facility];  Service: Endoscopy;  Laterality: N/A;  7:30am   BREAST LUMPECTOMY     benign cyst   CATARACT EXTRACTION W/PHACO Left 08/17/2012   Procedure: CATARACT EXTRACTION PHACO AND INTRAOCULAR LENS PLACEMENT ([place]);  Surgeon: [name], MD;  Location: [facility];  Service: Ophthalmology;  Laterality: Left;  CDE:18.73   CATARACT EXTRACTION W/PHACO Right 08/13/2019   Procedure: CATARACT EXTRACTION PHACO AND INTRAOCULAR LENS  PLACEMENT ([facility]);  Surgeon: [name], MD;  Location: [facility];  Service: Ophthalmology;  Laterality: Right;  CDE: 7.14   COLECTOMY     2005. Dr. Geoffry Paradise for diverticulitis   COLONOSCOPY  02/22/09   normal/left-sided diverticula/multiple colonic polyp, adenomatous   COLONOSCOPY  12/16/2011   colonic polyps-treated as described above. Status postprior sigmoid resection. adenomatous. next TCS 12/2014   COLONOSCOPY N/A 12/28/2014   Status post prior segmental resection. Few residual colonic diverticula- status post segmental biopsy negative random colon biopsies   ESOPHAGEAL DILATION N/A 12/28/2014   Procedure: ESOPHAGEAL DILATION;  Surgeon: [name], MD;  [facility];  Service: Endoscopy;  Laterality: N/A;   ESOPHAGOGASTRODUODENOSCOPY  02/22/09   normal s/p dilator;small HH/one large pedunculates polyp  s/p clipping, hyperplastic   ESOPHAGOGASTRODUODENOSCOPY  12/16/2011   [name]: small hiatal hernia. Hyperplastic apperating polyps. Status post [name] dilation as described above.   ESOPHAGOGASTRODUODENOSCOPY N/A 12/28/2014   RMR: Normal-appearing esophagus status post passage of a maloney dilator. Hiatal hernia. abnormal gastic mucosa of uncertain significance status post gastric biopsy with mild chronic gastritis, no H pylori.    TONSILLECTOMY     TOTAL ABDOMINAL HYSTERECTOMY      Review of Systems  Constitutional:  Negative for chills and fever.  Respiratory:  Negative for shortness of breath.   Cardiovascular:  Negative for chest pain.  Gastrointestinal:  Negative for nausea and vomiting.  Genitourinary:  Negative for dysuria.  Skin:  Positive for itching and rash.  Neurological:  Negative for dizziness and headaches.      Objective    BP 132/78   Pulse 68   Ht '5\' 2"'$  (1.575 m)   Wt 164 lb (74.4 kg)   SpO2 94%   BMI 30.00 kg/m   Physical Exam Cardiovascular:     Rate and Rhythm: Normal rate.     Pulses: Normal pulses.  Pulmonary:     Effort: Pulmonary  effort is normal.  Musculoskeletal:     Right lower leg: No edema.     Left lower leg: No edema.  Skin:    General: Skin is warm and dry.     Capillary Refill: Capillary refill takes less than 2 seconds.     Findings: Rash present. Rash is nodular and papular. Rash is not crusting, purpuric, scaling or urticarial.     Comments: Five hard small follicular papular spread around patient upper and lower back, no erythema or pus noted.  Neurological:     General: No focal deficit present.     Mental Status: She is alert.  Psychiatric:        Thought Content: Thought content normal.       Assessment & Plan:  Primary hypertension Assessment & Plan: Vitals:   06/20/22 1004 06/20/22 1010  BP: 131/81 132/78  At home reading blood pressure includes 136/65, 136/73, 128/64, 116/65, 107/54 139/77, 106/60 Blood pressure well controlled. Labs ordered  Continue taking losartan-HCTZ 50 mg-12.5 mg , metoprolol 25 mg  daily. Advise follow diet low in saturated fat, reduce dietary salt intake, avoid fatty foods,  maintain an exercise routine 3 to 5 days a week for a minimum total of 150 minutes.  Follow up in 3 months   Orders: -     Basic metabolic panel  Lichen planus -     Triamcinolone Acetonide; Apply 1 Application topically 2 (two) times daily.  Dispense: 30 g; Refill: 0 -     Ambulatory referral to Dermatology  Lichen planus, unspecified Assessment & Plan: Patient reports symptoms of constant itch for the past several months. Prescribed Kenalog  cream 0.1 % Advise patient to apply cold , wet cloth or ice pack to the skin that itches, wear loose-fitting , cotton clothing, use fragrance-free lotions, soaps, to minimize irritation.  Referral to dermatology for further evaluation.     Return in about 3 months (around 09/20/2022) for re-check blood pressure, hypertension, chronic follow-up.   Renard Hamper Ria Comment, FNP

## 2022-06-20 NOTE — Assessment & Plan Note (Addendum)
Vitals:   06/20/22 1004 06/20/22 1010  BP: 131/81 132/78  At home reading blood pressure includes 136/65, 136/73, 128/64, 116/65, 107/54 139/77, 106/60 Blood pressure well controlled. Labs ordered  Continue taking losartan-HCTZ 50 mg-12.5 mg , metoprolol 25 mg  daily. Advise follow diet low in saturated fat, reduce dietary salt intake, avoid fatty foods, maintain an exercise routine 3 to 5 days a week for a minimum total of 150 minutes.  Follow up in 3 months

## 2022-06-20 NOTE — Patient Instructions (Signed)
It was pleasure meeting with you today. Please take medications as prescribed. Follow up with your primary health provider if any health concerns arises.  

## 2022-06-20 NOTE — Telephone Encounter (Signed)
Prescription refill request for Eliquis received. Indication: AF Last office visit: 03/13/22  Myles Gip MD Scr: 1.18 on 05/22/22 Age: 88 Weight: 79.1kg  Pt to continue Eliquis 2.'5mg'$  twice daily per Dr Domenic Polite due to age and recent history of serum creatine > 1.5.  Refill approved.

## 2022-06-21 ENCOUNTER — Inpatient Hospital Stay: Payer: Medicare Other | Attending: Hematology | Admitting: Hematology

## 2022-06-21 ENCOUNTER — Encounter: Payer: Self-pay | Admitting: Hematology

## 2022-06-21 ENCOUNTER — Inpatient Hospital Stay: Payer: Medicare Other

## 2022-06-21 VITALS — BP 157/85 | HR 67 | Temp 98.1°F | Resp 16 | Ht 62.0 in | Wt 164.1 lb

## 2022-06-21 DIAGNOSIS — I4821 Permanent atrial fibrillation: Secondary | ICD-10-CM | POA: Insufficient documentation

## 2022-06-21 DIAGNOSIS — D649 Anemia, unspecified: Secondary | ICD-10-CM | POA: Diagnosis not present

## 2022-06-21 DIAGNOSIS — Z7901 Long term (current) use of anticoagulants: Secondary | ICD-10-CM | POA: Insufficient documentation

## 2022-06-21 DIAGNOSIS — I10 Essential (primary) hypertension: Secondary | ICD-10-CM | POA: Diagnosis not present

## 2022-06-21 DIAGNOSIS — E119 Type 2 diabetes mellitus without complications: Secondary | ICD-10-CM | POA: Diagnosis not present

## 2022-06-21 DIAGNOSIS — D509 Iron deficiency anemia, unspecified: Secondary | ICD-10-CM | POA: Diagnosis not present

## 2022-06-21 LAB — FERRITIN: Ferritin: 10 ng/mL — ABNORMAL LOW (ref 11–307)

## 2022-06-21 LAB — CBC WITH DIFFERENTIAL/PLATELET
Abs Immature Granulocytes: 0.01 10*3/uL (ref 0.00–0.07)
Basophils Absolute: 0 10*3/uL (ref 0.0–0.1)
Basophils Relative: 1 %
Eosinophils Absolute: 0 10*3/uL (ref 0.0–0.5)
Eosinophils Relative: 1 %
HCT: 36.8 % (ref 36.0–46.0)
Hemoglobin: 11.9 g/dL — ABNORMAL LOW (ref 12.0–15.0)
Immature Granulocytes: 0 %
Lymphocytes Relative: 23 %
Lymphs Abs: 1.1 10*3/uL (ref 0.7–4.0)
MCH: 30.7 pg (ref 26.0–34.0)
MCHC: 32.3 g/dL (ref 30.0–36.0)
MCV: 94.8 fL (ref 80.0–100.0)
Monocytes Absolute: 0.2 10*3/uL (ref 0.1–1.0)
Monocytes Relative: 5 %
Neutro Abs: 3.3 10*3/uL (ref 1.7–7.7)
Neutrophils Relative %: 70 %
Platelets: 208 10*3/uL (ref 150–400)
RBC: 3.88 MIL/uL (ref 3.87–5.11)
RDW: 14.6 % (ref 11.5–15.5)
WBC: 4.7 10*3/uL (ref 4.0–10.5)
nRBC: 0 % (ref 0.0–0.2)

## 2022-06-21 LAB — FOLATE: Folate: 11 ng/mL (ref >5.9)

## 2022-06-21 LAB — IRON AND TIBC
Iron: 69 ug/dL (ref 28–170)
Saturation Ratios: 16 % (ref 10.4–31.8)
TIBC: 440 ug/dL (ref 250–450)
UIBC: 371 ug/dL

## 2022-06-21 LAB — DIRECT ANTIGLOBULIN TEST (NOT AT ARMC)
DAT, IgG: NEGATIVE
DAT, complement: NEGATIVE

## 2022-06-21 LAB — RETICULOCYTES
Immature Retic Fract: 15 % (ref 2.3–15.9)
RBC.: 3.78 MIL/uL — ABNORMAL LOW (ref 3.87–5.11)
Retic Count, Absolute: 39.3 10*3/uL (ref 19.0–186.0)
Retic Ct Pct: 1 % (ref 0.4–3.1)

## 2022-06-21 LAB — LACTATE DEHYDROGENASE: LDH: 141 U/L (ref 98–192)

## 2022-06-21 LAB — VITAMIN B12: Vitamin B-12: 181 pg/mL (ref 180–914)

## 2022-06-21 NOTE — Patient Instructions (Addendum)
[facility]  Discharge Instructions  You were seen and examined today by Dr. Katragadda. Dr. Katragadda is a hematologist, meaning that he specializes in blood abnormalities. Dr. Katragadda discussed your past medical history, family history of cancers/blood conditions and the events that led to you being here today.  You were referred to Dr. Katragadda due to anemia. Dr. Katragadda has recommended additional labs today for further evaluation.  Follow-up as scheduled.  Thank you for choosing [facility] to provide your oncology and hematology care.   To afford each patient quality time with our provider, please arrive at least 15 minutes before your scheduled appointment time. You may need to reschedule your appointment if you arrive late (10 or more minutes). Arriving late affects you and other patients whose appointments are after yours.  Also, if you miss three or more appointments without notifying the office, you may be dismissed from the clinic at the provider's discretion.    Again, thank you for choosing [facility].  Our hope is that these requests will decrease the amount of time that you wait before being seen by our physicians.   If you have a lab appointment with the Cancer Center please come in thru the Main Entrance and check in at the main information desk.           _____________________________________________________________  Should you have questions after your visit to [facility], please contact our office at [phone] and follow the prompts.  Our office hours are 8:00 a.m. to 4:30 p.m. Monday - Thursday and 8:00 a.m. to 2:30 p.m. Friday.  Please note that voicemails left after 4:00 p.m. may not be returned until the following business day.  We are closed weekends and all major holidays.  You do have access to a nurse 24-7, just call the main number to the clinic [phone] and do not  press any options, hold on the line and a nurse will answer the phone.    For prescription refill requests, have your pharmacy contact our office and allow 72 hours.    Masks are optional in the cancer centers. If you would like for your care team to wear a mask while they are taking care of you, please let them know. You may have one support person who is at least 86 years old accompany you for your appointments.  
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
## 2022-06-22 ENCOUNTER — Other Ambulatory Visit: Payer: Self-pay | Admitting: Cardiology

## 2022-06-24 LAB — KAPPA/LAMBDA LIGHT CHAINS
Kappa free light chain: 67.5 mg/L — ABNORMAL HIGH (ref 3.3–19.4)
Kappa, lambda light chain ratio: 1.53 (ref 0.26–1.65)
Lambda free light chains: 44 mg/L — ABNORMAL HIGH (ref 5.7–26.3)

## 2022-06-24 LAB — COPPER, SERUM: Copper: 120 ug/dL (ref 80–158)

## 2022-06-26 LAB — PROTEIN ELECTROPHORESIS, SERUM
A/G Ratio: 1.2 (ref 0.7–1.7)
Albumin ELP: 3.7 g/dL (ref 2.9–4.4)
Alpha-1-Globulin: 0.3 g/dL (ref 0.0–0.4)
Alpha-2-Globulin: 0.7 g/dL (ref 0.4–1.0)
Beta Globulin: 1 g/dL (ref 0.7–1.3)
Gamma Globulin: 1.2 g/dL (ref 0.4–1.8)
Globulin, Total: 3.2 g/dL (ref 2.2–3.9)
Total Protein ELP: 6.9 g/dL (ref 6.0–8.5)

## 2022-06-26 LAB — METHYLMALONIC ACID, SERUM: Methylmalonic Acid, Quantitative: 534 nmol/L — ABNORMAL HIGH (ref 0–378)

## 2022-06-27 ENCOUNTER — Inpatient Hospital Stay: Payer: Medicare Other | Admitting: Licensed Clinical Social Worker

## 2022-06-27 DIAGNOSIS — D649 Anemia, unspecified: Secondary | ICD-10-CM

## 2022-06-28 NOTE — Progress Notes (Signed)
[facility] Clinical Social Work  Initial Assessment   [name] is a 88 y.o. year old female contacted by phone. Clinical Social Work was referred by medical provider for assessment of psychosocial needs.   SDOH (Social Determinants of Health) assessments performed: Yes SDOH Interventions    Flowsheet Row Office Visit from 05/22/2022 in [facility] Nutrition from 07/14/2015 in [place] at [facility] Interventions    Depression Interventions/Treatment  Counseling Currently on Treatment       SDOH Screenings   Food Insecurity: Food Insecurity Present (06/21/2022)  Housing: Medium Risk (06/21/2022)  Transportation Needs: Unmet Transportation Needs (06/21/2022)  Utilities: Not At Risk (06/21/2022)  Depression (PHQ2-9): Low Risk  (06/21/2022)  Recent Concern: Depression (PHQ2-9) - Medium Risk (06/20/2022)  Tobacco Use: Low Risk  (06/21/2022)     Distress Screen completed: No     No data to display            Family/Social Information:  Housing Arrangement: patient lives with her 80 and 25 year old sons, both receive disability.   Family members/support persons in your life? Pt has a daughter who resides in [place] who has offered to have pt move in with her.  Pt states her sons have a number of medical appointments themselves which sometimes conflict with her appointments resulting in inconsistent care.  According to her pt she is reluctant to leave her home as it is where she resided with her husband for many years before he passed in 2007.  CSW encouraged pt to consider what may be best for her health overall regarding her living situation.  Pt also has a son who resides in [place] and lost a son at the age of 60. Transportation concerns: yes  Employment: Retired .  Income source: Paediatric nurse concerns: Yes, current concerns Type of concern: Utilities, Medical bills, and  Food Food access concerns: yes Religious or spiritual practice: Not known Services Currently in place:  none  Coping/ Adjustment to diagnosis: Patient understands treatment plan and what happens next? yes Concerns about diagnosis and/or treatment: How I will pay for the services I need, How will I care for myself, and Quality of life Patient reported stressors: Finances, Transportation, Food, and Adjusting to my illness Hopes and/or priorities: Pt's priority is to continue to receive treatment with the hope of extending her life. Patient enjoys time with family/ friends Current coping skills/ strengths: Motivation for treatment/growth  and Supportive family/friends     SUMMARY: Current SDOH Barriers:  Financial constraints related to fixed income, Transportation, and Limited access to food  Clinical Social Work Clinical Goal(s):  Explore community resource options for unmet needs related to:  Transportation, Sales promotion account executive , and Food Insecurity   Interventions: Discussed common feeling and emotions when being diagnosed with cancer, and the importance of support during treatment Informed patient of the support team roles and support services at [facility] Provided [facility] contact information and encouraged patient to call with any questions or concerns CSW spoke to agency on aging who reported pt is registered with them, but not receiving services at this time.  CSW provided pt with the telephone number to the agency on aging to register for meals on wheels and any other services she may qualify for.  Pt encouraged to consider moving in with her daughter as it may provide additional support and address pt's transportation issue.  Pt also verbalized she had been sent home  with a stool kit and had lost some of the paperwork that was inside.  Medical team informed and will contact pt to instruct.     Follow Up Plan: Patient will contact CSW with any support or resource needs Patient verbalizes  understanding of plan: Yes    Henriette Combs, LCSW

## 2022-07-04 ENCOUNTER — Inpatient Hospital Stay: Payer: Medicare Other | Admitting: Oncology
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
## 2022-07-22 ENCOUNTER — Other Ambulatory Visit: Payer: Self-pay | Admitting: Family Medicine

## 2022-07-22 DIAGNOSIS — I10 Essential (primary) hypertension: Secondary | ICD-10-CM

## 2022-07-31 ENCOUNTER — Other Ambulatory Visit: Payer: Self-pay | Admitting: Cardiology

## 2022-07-31 DIAGNOSIS — I10 Essential (primary) hypertension: Secondary | ICD-10-CM

## 2022-08-21 ENCOUNTER — Other Ambulatory Visit: Payer: Self-pay | Admitting: Internal Medicine

## 2022-08-29 ENCOUNTER — Ambulatory Visit: Payer: Medicare Other | Admitting: "Endocrinology

## 2022-09-12 ENCOUNTER — Telehealth: Payer: Self-pay | Admitting: "Endocrinology

## 2022-09-12 NOTE — Telephone Encounter (Signed)
Spoke with pt's daughter [name]) advised her pt only needs to take her lantus one time daily and to increase it to 20 units, pt needs to continue glipizide 5mg  daily with breakfast and if pt experience her glucose drop to or below 70 or stays above 200 for a day to contact the office per Dr.Nida's orders. Understanding voiced.

## 2022-09-12 NOTE — Telephone Encounter (Signed)
Spoke with pt and her son. Pt's son states pt is not eating well and has not been taking her oral medications and there seems to be confusion over her insulin. States her glucose has been running between 118-208 for the past 24-48hrs. States he did get her to eat grits this morning. He has been giving her lantus multiple times a day he states 3-4 units at a time. This morning after eating her glucose was 208 and she was given 4 units of lantus.

## 2022-09-12 NOTE — Telephone Encounter (Signed)
Pt is asking for the nurse to give her a call back  [phone]

## 2022-09-13 ENCOUNTER — Ambulatory Visit: Payer: Medicare Other | Admitting: Family Medicine

## 2022-09-15 ENCOUNTER — Observation Stay (HOSPITAL_COMMUNITY): Payer: Medicare Other

## 2022-09-15 ENCOUNTER — Emergency Department (HOSPITAL_COMMUNITY): Payer: Medicare Other

## 2022-09-15 ENCOUNTER — Other Ambulatory Visit: Payer: Self-pay

## 2022-09-15 ENCOUNTER — Observation Stay (HOSPITAL_COMMUNITY)
Admission: EM | Admit: 2022-09-15 | Discharge: 2022-09-16 | Disposition: A | Discharge status: Home / Self Care | Payer: Medicare Other | Attending: Internal Medicine | Admitting: Internal Medicine

## 2022-09-15 DIAGNOSIS — Z7984 Long term (current) use of oral hypoglycemic drugs: Secondary | ICD-10-CM | POA: Insufficient documentation

## 2022-09-15 DIAGNOSIS — L8990 Pressure ulcer of unspecified site, unspecified stage: Secondary | ICD-10-CM | POA: Diagnosis present

## 2022-09-15 DIAGNOSIS — K5732 Diverticulitis of large intestine without perforation or abscess without bleeding: Secondary | ICD-10-CM | POA: Diagnosis not present

## 2022-09-15 DIAGNOSIS — I10 Essential (primary) hypertension: Secondary | ICD-10-CM | POA: Diagnosis present

## 2022-09-15 DIAGNOSIS — J45909 Unspecified asthma, uncomplicated: Secondary | ICD-10-CM | POA: Insufficient documentation

## 2022-09-15 DIAGNOSIS — R531 Weakness: Secondary | ICD-10-CM

## 2022-09-15 DIAGNOSIS — E1122 Type 2 diabetes mellitus with diabetic chronic kidney disease: Secondary | ICD-10-CM | POA: Insufficient documentation

## 2022-09-15 DIAGNOSIS — R509 Fever, unspecified: Secondary | ICD-10-CM

## 2022-09-15 DIAGNOSIS — I129 Hypertensive chronic kidney disease with stage 1 through stage 4 chronic kidney disease, or unspecified chronic kidney disease: Secondary | ICD-10-CM | POA: Diagnosis not present

## 2022-09-15 DIAGNOSIS — Z794 Long term (current) use of insulin: Secondary | ICD-10-CM | POA: Diagnosis not present

## 2022-09-15 DIAGNOSIS — E538 Deficiency of other specified B group vitamins: Secondary | ICD-10-CM

## 2022-09-15 DIAGNOSIS — G9341 Metabolic encephalopathy: Principal | ICD-10-CM | POA: Diagnosis present

## 2022-09-15 DIAGNOSIS — R4182 Altered mental status, unspecified: Secondary | ICD-10-CM

## 2022-09-15 DIAGNOSIS — Z7901 Long term (current) use of anticoagulants: Secondary | ICD-10-CM | POA: Diagnosis not present

## 2022-09-15 DIAGNOSIS — Z1152 Encounter for screening for COVID-19: Secondary | ICD-10-CM | POA: Diagnosis not present

## 2022-09-15 DIAGNOSIS — Z79899 Other long term (current) drug therapy: Secondary | ICD-10-CM | POA: Diagnosis not present

## 2022-09-15 DIAGNOSIS — L89151 Pressure ulcer of sacral region, stage 1: Secondary | ICD-10-CM | POA: Diagnosis not present

## 2022-09-15 DIAGNOSIS — N183 Chronic kidney disease, stage 3 unspecified: Secondary | ICD-10-CM | POA: Diagnosis not present

## 2022-09-15 DIAGNOSIS — I4821 Permanent atrial fibrillation: Secondary | ICD-10-CM | POA: Diagnosis not present

## 2022-09-15 DIAGNOSIS — L439 Lichen planus, unspecified: Secondary | ICD-10-CM

## 2022-09-15 LAB — COMPREHENSIVE METABOLIC PANEL
ALT: 12 U/L (ref 0–44)
AST: 21 U/L (ref 15–41)
Albumin: 3.7 g/dL (ref 3.5–5.0)
Alkaline Phosphatase: 65 U/L (ref 38–126)
Anion gap: 11 (ref 5–15)
BUN: 25 mg/dL — ABNORMAL HIGH (ref 8–23)
CO2: 24 mmol/L (ref 22–32)
Calcium: 8.9 mg/dL (ref 8.9–10.3)
Chloride: 99 mmol/L (ref 98–111)
Creatinine, Ser: 1.56 mg/dL — ABNORMAL HIGH (ref 0.44–1.00)
GFR, Estimated: 32 mL/min — ABNORMAL LOW (ref >60)
Glucose, Bld: 310 mg/dL — ABNORMAL HIGH (ref 70–99)
Potassium: 3.9 mmol/L (ref 3.5–5.1)
Sodium: 134 mmol/L — ABNORMAL LOW (ref 135–145)
Total Bilirubin: 0.9 mg/dL (ref 0.3–1.2)
Total Protein: 7.1 g/dL (ref 6.5–8.1)

## 2022-09-15 LAB — URINALYSIS, ROUTINE W REFLEX MICROSCOPIC
Bacteria, UA: NONE SEEN
Bilirubin Urine: NEGATIVE
Glucose, UA: 150 mg/dL — AB
Hgb urine dipstick: NEGATIVE
Ketones, ur: NEGATIVE mg/dL
Leukocytes,Ua: NEGATIVE
Nitrite: NEGATIVE
Protein, ur: 30 mg/dL — AB
Specific Gravity, Urine: 1.01 (ref 1.005–1.030)
pH: 6 (ref 5.0–8.0)

## 2022-09-15 LAB — SARS CORONAVIRUS 2 BY RT PCR: SARS Coronavirus 2 by RT PCR: NEGATIVE

## 2022-09-15 LAB — RAPID URINE DRUG SCREEN, HOSP PERFORMED
Amphetamines: NOT DETECTED
Barbiturates: NOT DETECTED
Benzodiazepines: NOT DETECTED
Cocaine: NOT DETECTED
Opiates: NOT DETECTED
Tetrahydrocannabinol: NOT DETECTED

## 2022-09-15 LAB — CBC WITH DIFFERENTIAL/PLATELET
Abs Immature Granulocytes: 0.04 10*3/uL (ref 0.00–0.07)
Basophils Absolute: 0 10*3/uL (ref 0.0–0.1)
Basophils Relative: 0 %
Eosinophils Absolute: 0 10*3/uL (ref 0.0–0.5)
Eosinophils Relative: 0 %
HCT: 37.6 % (ref 36.0–46.0)
Hemoglobin: 12.5 g/dL (ref 12.0–15.0)
Immature Granulocytes: 1 %
Lymphocytes Relative: 14 %
Lymphs Abs: 0.8 10*3/uL (ref 0.7–4.0)
MCH: 31.7 pg (ref 26.0–34.0)
MCHC: 33.2 g/dL (ref 30.0–36.0)
MCV: 95.4 fL (ref 80.0–100.0)
Monocytes Absolute: 0.3 10*3/uL (ref 0.1–1.0)
Monocytes Relative: 5 %
Neutro Abs: 4.8 10*3/uL (ref 1.7–7.7)
Neutrophils Relative %: 80 %
Platelets: 207 10*3/uL (ref 150–400)
RBC: 3.94 MIL/uL (ref 3.87–5.11)
RDW: 14.7 % (ref 11.5–15.5)
WBC: 6 10*3/uL (ref 4.0–10.5)
nRBC: 0 % (ref 0.0–0.2)

## 2022-09-15 LAB — MAGNESIUM: Magnesium: 1.4 mg/dL — ABNORMAL LOW (ref 1.7–2.4)

## 2022-09-15 LAB — LACTIC ACID, PLASMA: Lactic Acid, Venous: 1.6 mmol/L (ref 0.5–1.9)

## 2022-09-15 LAB — CBG MONITORING, ED: Glucose-Capillary: 274 mg/dL — ABNORMAL HIGH (ref 70–99)

## 2022-09-15 LAB — GLUCOSE, CAPILLARY
Glucose-Capillary: 143 mg/dL — ABNORMAL HIGH (ref 70–99)
Glucose-Capillary: 114 mg/dL — ABNORMAL HIGH (ref 70–99)

## 2022-09-15 LAB — LIPASE, BLOOD: Lipase: 49 U/L (ref 11–51)

## 2022-09-15 LAB — TROPONIN I (HIGH SENSITIVITY): Troponin I (High Sensitivity): 11 ng/L (ref <18)

## 2022-09-15 LAB — ETHANOL: Alcohol, Ethyl (B): <10 mg/dL (ref <10)

## 2022-09-15 LAB — AMMONIA: Ammonia: 22 umol/L (ref 9–35)

## 2022-09-15 MED ORDER — ONDANSETRON HCL 4 MG/2ML IJ SOLN: 4.0000 mg | Freq: Four times a day (QID) | INTRAMUSCULAR | Status: DC | PRN

## 2022-09-15 MED ORDER — ALBUTEROL SULFATE HFA 108 (90 BASE) MCG/ACT IN AERS: 2.0000 | INHALATION_SPRAY | Freq: Four times a day (QID) | RESPIRATORY_TRACT | Status: DC | PRN

## 2022-09-15 MED ORDER — POLYETHYLENE GLYCOL 3350 17 G PO PACK: 17.0000 g | PACK | Freq: Every day | ORAL | Status: DC | PRN

## 2022-09-15 MED ORDER — ALBUTEROL SULFATE (2.5 MG/3ML) 0.083% IN NEBU: 2.5000 mg | INHALATION_SOLUTION | Freq: Four times a day (QID) | RESPIRATORY_TRACT | Status: DC | PRN

## 2022-09-15 MED ORDER — ONDANSETRON HCL 4 MG PO TABS: 4.0000 mg | ORAL_TABLET | Freq: Four times a day (QID) | ORAL | Status: DC | PRN

## 2022-09-15 MED ORDER — PANTOPRAZOLE SODIUM 40 MG PO TBEC
40.0000 mg | DELAYED_RELEASE_TABLET | Freq: Two times a day (BID) | ORAL | Status: DC
  Administered 2022-09-15 – 2022-09-16 (×2): 40 mg via ORAL
  Filled 2022-09-15 (×2): qty 1

## 2022-09-15 MED ORDER — LEVOFLOXACIN IN D5W 750 MG/150ML IV SOLN
750.0000 mg | Freq: Once | INTRAVENOUS | Status: AC
  Administered 2022-09-15: 750 mg via INTRAVENOUS
  Filled 2022-09-15: qty 150

## 2022-09-15 MED ORDER — INSULIN ASPART 100 UNIT/ML IJ SOLN
0.0000 [IU] | Freq: Three times a day (TID) | INTRAMUSCULAR | Status: DC
  Administered 2022-09-16 (×2): 2 [IU] via SUBCUTANEOUS

## 2022-09-15 MED ORDER — METOPROLOL TARTRATE 25 MG PO TABS
12.5000 mg | ORAL_TABLET | Freq: Two times a day (BID) | ORAL | Status: DC
  Administered 2022-09-15 – 2022-09-16 (×2): 12.5 mg via ORAL
  Filled 2022-09-15 (×2): qty 1

## 2022-09-15 MED ORDER — ACETAMINOPHEN 325 MG PO TABS
650.0000 mg | ORAL_TABLET | Freq: Once | ORAL | Status: AC
  Administered 2022-09-15: 650 mg via ORAL
  Filled 2022-09-15: qty 2

## 2022-09-15 MED ORDER — SODIUM CHLORIDE 0.9 % IV SOLN: INTRAVENOUS | Status: AC

## 2022-09-15 MED ORDER — SODIUM CHLORIDE 0.9 % IV SOLN: INTRAVENOUS | Status: DC

## 2022-09-15 MED ORDER — INSULIN ASPART 100 UNIT/ML IJ SOLN: 0.0000 [IU] | Freq: Every day | INTRAMUSCULAR | Status: DC

## 2022-09-15 MED ORDER — LOSARTAN POTASSIUM 50 MG PO TABS
50.0000 mg | ORAL_TABLET | Freq: Every day | ORAL | Status: DC
  Administered 2022-09-16: 50 mg via ORAL
  Filled 2022-09-15: qty 1

## 2022-09-15 MED ORDER — ACETAMINOPHEN 325 MG PO TABS
650.0000 mg | ORAL_TABLET | Freq: Four times a day (QID) | ORAL | Status: DC | PRN
  Administered 2022-09-16: 650 mg via ORAL
  Filled 2022-09-15: qty 2

## 2022-09-15 MED ORDER — ACETAMINOPHEN 650 MG RE SUPP: 650.0000 mg | Freq: Four times a day (QID) | RECTAL | Status: DC | PRN

## 2022-09-15 MED ORDER — MAGNESIUM SULFATE 2 GM/50ML IV SOLN
2.0000 g | INTRAVENOUS | Status: AC
  Administered 2022-09-15: 2 g via INTRAVENOUS
  Filled 2022-09-15: qty 50

## 2022-09-15 NOTE — ED Triage Notes (Signed)
Pt c/o hyperglycemia since this morning. States that sbg was in the 70s and then when checked again a little while later, cbg was in the 300s, family member at bedside states increased confusion. MD at bedside

## 2022-09-15 NOTE — Assessment & Plan Note (Addendum)
Etiology is yet undetermined.  Reported subjective fevers.  No history to suggest dehydration.  T max- 99.8.  Rules out for sepsis.  WBC 6.  Lactic acid 1.6.  Left lower quadrant and suprapubic tenderness.  UA not suggestive of infection.  Chest x-ray clear.  Head CT unremarkable. -Obtain CT abdomen and pelvis with without contrast - ~500 ml  given, continue N/s 75cc/hr x 15hrs -IV Levaquin given, hold off on further antibiotics for now

## 2022-09-15 NOTE — Assessment & Plan Note (Signed)
Stable. -Resume losartan-HCTZ in a.m.

## 2022-09-15 NOTE — ED Provider Notes (Signed)
[place] EMERGENCY DEPARTMENT AT [facility] Provider Note   CSN: [id number] Arrival date & time: 09/15/22  1244     History {Add pertinent medical, surgical, social history, OB history to HPI:1} Chief Complaint  Patient presents with   Hyperglycemia    [name] is a 88 y.o. female.   Hyperglycemia  This patient is an 88 year old female who is a known diabetic on Lantus insulin as well as glipizide, takes glipizide in the morning and Lantus at night.  Also has a history of hypertension on losartan hydrochlorothiazide and is known to have atrial fibrillation on metoprolol and Eliquis.  The patient reportedly was doing well until Friday when she became very confused and altered, this lasted throughout the better part of the day, yesterday was a much better day and she had normal blood sugars, today she woke up with a blood sugar of 70 was given some food but became pretty severely hyperglycemic throughout the morning according to family members reaching as high as 370 or 380.  She was given an extra 6 units of Lantus by her son who she lives with, the daughter decided to bring her to the hospital for evaluation.  She had a tactile temperature according to the daughter, no antipyretics were given prehospital.  There was an episode of diarrhea today, there has been no urinary symptoms, no coughing or shortness of breath, no rashes, no vomiting.  The patient is only able to tell me that she does not feel well but cannot tell me why or how    Home Medications Prior to Admission medications   Medication Sig Start Date End Date Taking? Authorizing Provider  albuterol (VENTOLIN HFA) 108 (90 Base) MCG/ACT inhaler Inhale 2 puffs into the lungs every 6 (six) hours as needed for wheezing or shortness of breath. 05/22/22   Del Nigel Berthold, FNP  apixaban (ELIQUIS) 2.5 MG TABS tablet TAKE 1 TABLET BY MOUTH TWICE DAILY 06/20/22   Jonelle Sidle, MD  busPIRone (BUSPAR) 5 MG  tablet Take 5 mg by mouth once. 04/04/22   [provider]  Continuous Blood Gluc Receiver (FREESTYLE LIBRE 2 READER) DEVI As directed 01/22/22   Roma Kayser, MD  Continuous Blood Gluc Sensor (FREESTYLE LIBRE 2 SENSOR) MISC 1 Piece by Does not apply route every 14 (fourteen) days. 11/22/21   Roma Kayser, MD  gabapentin (NEURONTIN) 300 MG capsule Take 1 capsule (300 mg total) by mouth 2 (two) times daily as needed. 05/22/22   Del Nigel Berthold, FNP  glipiZIDE (GLUCOTROL XL) 5 MG 24 hr tablet Take 1 tablet (5 mg total) by mouth daily with breakfast. 05/27/22   Nida, Denman George, MD  GLOBAL EASE INJECT PEN NEEDLES 31G X 5 MM MISC USE ONE TIP WITH INSULIN AT BEDTIME 07/10/20   Nida, Denman George, MD  insulin glargine (LANTUS SOLOSTAR) 100 UNIT/ML Solostar Pen Inject 16 Units into the skin at bedtime. 05/27/22   Roma Kayser, MD  loperamide (IMODIUM) 2 MG capsule Take 1 mg by mouth as needed for diarrhea or loose stools.    [provider]  losartan-hydrochlorothiazide (HYZAAR) 50-12.5 MG tablet Take 1 tablet by mouth daily. 05/27/22   Roma Kayser, MD  metoprolol tartrate (LOPRESSOR) 25 MG tablet TAKE 1/2 TABLET BY MOUTH TWICE DAILY 06/24/22   Jonelle Sidle, MD  nitroGLYCERIN (NITROSTAT) 0.4 MG SL tablet PLACE 1 TABLET UNDER THE TONGUE EVERY 5 MINUTES FOR 3 DOSES AS NEEDED FOR CHEST  PAIN. IF YOU HAVE NO RELIEF AFTER THE 3RD DOSE PROCEED TO THE ED FOR AN EVALUATION Patient not taking: Reported on 06/21/2022 06/15/19   Jonelle Sidle, MD  ondansetron (ZOFRAN-ODT) 4 MG disintegrating tablet Take 4 mg by mouth every 8 (eight) hours as needed for nausea or vomiting.  Patient not taking: Reported on 06/21/2022 11/11/11   [provider]  pantoprazole (PROTONIX) 40 MG tablet TAKE 1 TABLET BY MOUTH TWICE DAILY 08/21/22   [name], Gerrit Friends, MD  simvastatin (ZOCOR) 20 MG tablet Take 20 mg by mouth daily.  08/03/13   [provider]   triamcinolone cream (KENALOG) 0.1 % Apply 1 Application topically 2 (two) times daily. 06/20/22   Del Nigel Berthold, FNP      Allergies    Adhesive [tape], Demerol [meperidine], Iohexol, Meperidine hcl, Shellfish allergy, and Penicillins    Review of Systems   Review of Systems  Unable to perform ROS: Mental status change    Physical Exam Updated Vital Signs Ht 1.575 m (5\' 2" )   Wt 74.4 kg   BMI 30.00 kg/m  Physical Exam Vitals and nursing note reviewed.  Constitutional:      General: She is not in acute distress.    Appearance: She is well-developed.  HENT:     Head: Normocephalic and atraumatic.     Mouth/Throat:     Pharynx: No oropharyngeal exudate.  Eyes:     General: No scleral icterus.       Right eye: No discharge.        Left eye: No discharge.     Conjunctiva/sclera: Conjunctivae normal.     Pupils: Pupils are equal, round, and reactive to light.  Neck:     Thyroid: No thyromegaly.     Vascular: No JVD.  Cardiovascular:     Rate and Rhythm: Normal rate and regular rhythm.     Heart sounds: Normal heart sounds. No murmur heard.    No friction rub. No gallop.  Pulmonary:     Effort: Pulmonary effort is normal. No respiratory distress.     Breath sounds: Normal breath sounds. No wheezing or rales.  Abdominal:     General: Bowel sounds are normal. There is no distension.     Palpations: Abdomen is soft. There is no mass.     Tenderness: There is abdominal tenderness.     Comments: Mild tenderness to palpation throughout the abdomen without guarding or peritoneal signs, no focality  Musculoskeletal:        General: No tenderness. Normal range of motion.     Cervical back: Normal range of motion and neck supple.     Right lower leg: No edema.     Left lower leg: No edema.  Lymphadenopathy:     Cervical: No cervical adenopathy.  Skin:    General: Skin is warm and dry.     Findings: No erythema or rash.  Neurological:     Mental Status: She is  alert.     Coordination: Coordination normal.     Comments: The patient is awake and alert, she is able to lift both arms and both legs symmetrically with 5 out of 5 strength.  She is a little bit slow to answer questions and when she gets up from the chair to get into the bed she has some difficulty requiring assistance from 2 people.  She has no facial droop, is able to perform finger-nose-finger, equal grips  Psychiatric:  Behavior: Behavior normal.     ED Results / Procedures / Treatments   Labs (all labs ordered are listed, but only abnormal results are displayed) Labs Reviewed  CBG MONITORING, ED - Abnormal; Notable for the following components:      Result Value   Glucose-Capillary 274 (*)    All other components within normal limits    EKG None  Radiology No results found.  Procedures Procedures  {Document cardiac monitor, telemetry assessment procedure when appropriate:1}  Medications Ordered in ED Medications - No data to display  ED Course/ Medical Decision Making/ A&P   {   Click here for ABCD2, HEART and other calculatorsREFRESH Note before signing :1}                          Medical Decision Making Amount and/or Complexity of Data Reviewed Labs: ordered. Radiology: ordered.    This patient presents to the ED for concern of altered mental status, this involves an extensive number of treatment options, and is a complaint that carries with it a high risk of complications and morbidity.  The differential diagnosis includes hyperglycemic crisis, stroke, infection, heart problems, electrolyte abnormalities, renal dysfunction, overdose, toxic metabolic, encephalopathy   Co morbidities that complicate the patient evaluation  Diabetes, elderly   Additional history obtained:  Additional history obtained from medical record External records from outside source obtained and reviewed including follows with primary care doctor and follows with oncology  secondary to chronic anemia.  Has not been seen in several months. Last echocardiogram showed ejection fraction of 65 to 70%, indeterminate left ventricular diastolic parameters   Lab Tests:  I Ordered, and personally interpreted labs.  The pertinent results include:  ***   Imaging Studies ordered:  I ordered imaging studies including ***  I independently visualized and interpreted imaging which showed *** I agree with the radiologist interpretation   Cardiac Monitoring: / EKG:  The patient was maintained on a cardiac monitor.  I personally viewed and interpreted the cardiac monitored which showed an underlying rhythm of: ***   Consultations Obtained:  I requested consultation with the ***,  and discussed lab and imaging findings as well as pertinent plan - they recommend: ***   Problem List / ED Course / Critical interventions / Medication management  *** I ordered medication including ***  for ***  Reevaluation of the patient after these medicines showed that the patient {resolved/improved/worsened:23923::"improved"} I have reviewed the patients home medicines and have made adjustments as needed   Social Determinants of Health:  ***   Test / Admission - Considered:  ***   {Document critical care time when appropriate:1} {Document review of labs and clinical decision tools ie heart score, Chads2Vasc2 etc:1}  {Document your independent review of radiology images, and any outside records:1} {Document your discussion with family members, caretakers, and with consultants:1} {Document social determinants of health affecting pt's care:1} {Document your decision making why or why not admission, treatments were needed:1} Final Clinical Impression(s) / ED Diagnoses Final diagnoses:  None    Rx / DC Orders ED Discharge Orders     None

## 2022-09-15 NOTE — ED Notes (Signed)
Sacral pad placed on sacrum  

## 2022-09-15 NOTE — Assessment & Plan Note (Signed)
Stage1 sacral decubitus  ulcer.

## 2022-09-15 NOTE — ED Notes (Signed)
Patient transported to CT 

## 2022-09-15 NOTE — ED Notes (Signed)
Small area of skin breakdown noted on pt sacral area

## 2022-09-15 NOTE — H&P (Signed)
History and Physical    [name] ZOX:[id number] DOB: 02-04-1935 DOA: 09/15/2022  PCP: Rica Records, FNP   Patient coming from: Home  I have personally briefly reviewed patient's old medical records in [facility]  Chief Complaint: Weakness, confusion  HPI: [name] is a 88 y.o. female with medical history significant for DM, HTN, OSA, Atria Fib.  Patient presented to the ED with complaints of generalized weakness, confusion and fatigue.  At the time of my evaluation, patient is still confused, she is able to answer simple questions, but unable to give me history.  Her daughter Olegario Messier is at bedside and provides the history.  She reports confusion started 2 days ago- with patients speech not making sense, yesterday it improved but today it got worse, and patient had subjective fevers which she felt today.  She has maintained good oral intake.  No vomiting, had 1 episode of loose stools today.  Has a mild chronic cough, no difficulty breathing.  No complaints of chest pain.  At baseline she has no memory problems and ambulates with a walker.  ED Course: Tmax of 99.8.  Heart rate 60s - 70s, respirate rate 18-25.  Blood pressure systolic 120s to 811B.  O2 sats greater than 95% on room air.  Troponin 11.  UA unremarkable.  Ammonia 22.  Lactic acid 1.6.  Head CT negative for acute intracranial abnormality.  Chest x-ray clear. IV Levaquin given.  Review of Systems: As per HPI all other systems reviewed and negative.  Past Medical History:  Diagnosis Date   Anxiety    Asthma    Chronic back pain    Colonoscopy causing post-procedural bleeding    Incomplete. by Dr. Jena Gauss 06/11/99 due to fixed non-compliant colon precluded exam to 40 cm, she had subsequent colectomy for diverticulitis since that time.    Diarrhea    Esophageal reflux    Essential hypertension    Fibromyalgia    Hiatal hernia    Recent EGD/TCS showed small hiatal hernia, normal apperaring tubular  esophagus. s/p passage of a 54-french Maloney dilator, s/p biopsy esophageal mucosa, multiple fundal gland type gastric polyps. one large pedunculated polp with oozing, noted s/p clipping and snare polypectomy. Biopsies of the gastric mucosa taken given her symptoms in her elevated count. normal D1-D3, s/p biospy D2-D3.    Hiatal hernia    all bx unremarkable. on TCS she had left-sided diverticula, evidence of prior segmental resection with anastomosis, multiple colonic polyps s/p multiple snare polypectomies, s/p segmental biopsy and stool sampling. she had multiple tubular adenomas. no microscopic/collagenous colitis. stool culture, c diff, O+P, lactoferrin were negative.    Mixed hyperlipidemia    Nephrolithiasis    OSA (obstructive sleep apnea)    Permanent atrial fibrillation (HCC)    Previous failed DCCV   Type 2 diabetes mellitus (HCC)    Ureteral stent retained    Not retained. ureteral stent removal gross hematuria resolved 10/11. coumadin restarted.    Ventral hernia     Past Surgical History:  Procedure Laterality Date   AGILE CAPSULE N/A 08/17/2020   Procedure: AGILE CAPSULE;  Surgeon: Corbin Ade, MD;  Location: [facility];  Service: Endoscopy;  Laterality: N/A;  7:30am   BREAST LUMPECTOMY     benign cyst   CATARACT EXTRACTION W/PHACO Left 08/17/2012   Procedure: CATARACT EXTRACTION PHACO AND INTRAOCULAR LENS PLACEMENT (IOC);  Surgeon: Gemma Payor, MD;  Location: [facility] ORS;  Service: Ophthalmology;  Laterality: Left;  CDE:18.73   CATARACT EXTRACTION W/PHACO Right 08/13/2019   Procedure: CATARACT EXTRACTION PHACO AND INTRAOCULAR LENS PLACEMENT (IOC);  Surgeon: Fabio Pierce, MD;  Location: [facility] ORS;  Service: Ophthalmology;  Laterality: Right;  CDE: 7.14   COLECTOMY     2005. Dr. Salvadore Dom for diverticulitis   COLONOSCOPY  02/22/09   normal/left-sided diverticula/multiple colonic polyp, adenomatous   COLONOSCOPY  12/16/2011   colonic polyps-treated as described above. Status  postprior sigmoid resection. adenomatous. next TCS 12/2014   COLONOSCOPY N/A 12/28/2014   Status post prior segmental resection. Few residual colonic diverticula- status post segmental biopsy negative random colon biopsies   ESOPHAGEAL DILATION N/A 12/28/2014   Procedure: ESOPHAGEAL DILATION;  Surgeon: Corbin Ade, MD;  Location: [facility];  Service: Endoscopy;  Laterality: N/A;   ESOPHAGOGASTRODUODENOSCOPY  02/22/09   normal s/p dilator;small HH/one large pedunculates polyp  s/p clipping, hyperplastic   ESOPHAGOGASTRODUODENOSCOPY  12/16/2011   [name]: small hiatal hernia. Hyperplastic apperating polyps. Status post Elease Hashimoto dilation as described above.   ESOPHAGOGASTRODUODENOSCOPY N/A 12/28/2014   RMR: Normal-appearing esophagus status post passage of a maloney dilator. Hiatal hernia. abnormal gastic mucosa of uncertain significance status post gastric biopsy with mild chronic gastritis, no H pylori.    TONSILLECTOMY     TOTAL ABDOMINAL HYSTERECTOMY       reports that she has never smoked. She has never used smokeless tobacco. She reports that she does not drink alcohol and does not use drugs.  Allergies  Allergen Reactions   Adhesive [Tape] Itching    Pulls skin off.    Demerol [Meperidine]     Causes Vomiting   Iohexol Nausea And Vomiting    Patient states she almost died from severe n/v    Meperidine Hcl Nausea And Vomiting   Shellfish Allergy Diarrhea and Nausea And Vomiting   Penicillins Nausea And Vomiting and Rash    Family History  Problem Relation Age of Onset   Hypertension Father    Diabetes Father    Heart attack Father    Heart attack Mother    Diabetes Mother    Hypertension Mother    Depression Son    Pancreatitis Son    Coronary artery disease Other        Family Hx    Colon cancer Maternal Grandfather    Heart disease Sister    Mental retardation Brother    Hernia Son     Prior to Admission medications   Medication Sig Start Date End Date Taking?  Authorizing Provider  albuterol (VENTOLIN HFA) 108 (90 Base) MCG/ACT inhaler Inhale 2 puffs into the lungs every 6 (six) hours as needed for wheezing or shortness of breath. 05/22/22   Del Nigel Berthold, FNP  apixaban (ELIQUIS) 2.5 MG TABS tablet TAKE 1 TABLET BY MOUTH TWICE DAILY 06/20/22   Jonelle Sidle, MD  busPIRone (BUSPAR) 5 MG tablet Take 5 mg by mouth once. 04/04/22   [provider]  Continuous Blood Gluc Receiver (FREESTYLE LIBRE 2 READER) DEVI As directed 01/22/22   Roma Kayser, MD  Continuous Blood Gluc Sensor (FREESTYLE LIBRE 2 SENSOR) MISC 1 Piece by Does not apply route every 14 (fourteen) days. 11/22/21   Roma Kayser, MD  gabapentin (NEURONTIN) 300 MG capsule Take 1 capsule (300 mg total) by mouth 2 (two) times daily as needed. 05/22/22   Del Nigel Berthold, FNP  glipiZIDE (GLUCOTROL XL) 5 MG 24 hr tablet Take 1 tablet (5 mg total) by mouth daily with  breakfast. 05/27/22   Roma Kayser, MD  GLOBAL EASE INJECT PEN NEEDLES 31G X 5 MM MISC USE ONE TIP WITH INSULIN AT BEDTIME 07/10/20   Roma Kayser, MD  insulin glargine (LANTUS SOLOSTAR) 100 UNIT/ML Solostar Pen Inject 16 Units into the skin at bedtime. 05/27/22   Roma Kayser, MD  loperamide (IMODIUM) 2 MG capsule Take 1 mg by mouth as needed for diarrhea or loose stools.    [provider]  losartan-hydrochlorothiazide (HYZAAR) 50-12.5 MG tablet Take 1 tablet by mouth daily. 05/27/22   Roma Kayser, MD  metoprolol tartrate (LOPRESSOR) 25 MG tablet TAKE 1/2 TABLET BY MOUTH TWICE DAILY 06/24/22   Jonelle Sidle, MD  nitroGLYCERIN (NITROSTAT) 0.4 MG SL tablet PLACE 1 TABLET UNDER THE TONGUE EVERY 5 MINUTES FOR 3 DOSES AS NEEDED FOR CHEST PAIN. IF YOU HAVE NO RELIEF AFTER THE 3RD DOSE PROCEED TO THE ED FOR AN EVALUATION Patient not taking: Reported on 06/21/2022 06/15/19   Jonelle Sidle, MD  ondansetron (ZOFRAN-ODT) 4 MG disintegrating tablet Take 4 mg  by mouth every 8 (eight) hours as needed for nausea or vomiting.  Patient not taking: Reported on 06/21/2022 11/11/11   [provider]  pantoprazole (PROTONIX) 40 MG tablet TAKE 1 TABLET BY MOUTH TWICE DAILY 08/21/22   [name], Gerrit Friends, MD  simvastatin (ZOCOR) 20 MG tablet Take 20 mg by mouth daily.  08/03/13   [provider]  triamcinolone cream (KENALOG) 0.1 % Apply 1 Application topically 2 (two) times daily. 06/20/22   Del Nigel Berthold, FNP    Physical Exam: Vitals:   09/15/22 1300 09/15/22 1634 09/15/22 1649 09/15/22 1700  BP: (!) 141/60 (!) 148/84 (!) 125/91   Pulse: 76 65 74   Resp: 18 (!) 25 (!) 21   Temp: 99.8 F (37.7 C) 99 F (37.2 C) 97.9 F (36.6 C)   TempSrc: Rectal  Oral   SpO2: 95% 98% 99%   Weight:    71.2 kg  Height:    5\' 2"  (1.575 m)    Constitutional: NAD, calm, comfortable Vitals:   09/15/22 1300 09/15/22 1634 09/15/22 1649 09/15/22 1700  BP: (!) 141/60 (!) 148/84 (!) 125/91   Pulse: 76 65 74   Resp: 18 (!) 25 (!) 21   Temp: 99.8 F (37.7 C) 99 F (37.2 C) 97.9 F (36.6 C)   TempSrc: Rectal  Oral   SpO2: 95% 98% 99%   Weight:    71.2 kg  Height:    5\' 2"  (1.575 m)   Eyes: PERRL, lids and conjunctivae normal ENMT: Mucous membranes are moist.  Neck: normal, supple, no masses, no thyromegaly Respiratory: clear to auscultation bilaterally, no wheezing, no crackles. Normal respiratory effort. No accessory muscle use.  Cardiovascular: Regular rate and rhythm, 3/6 known systolic murmur loudest aortic area / rubs / gallops. No extremity edema.  Abdomen: tenderness to suprapubic and left lower quadrant area, no masses palpated. No hepatosplenomegaly. Bowel sounds positive.  Musculoskeletal: no clubbing / cyanosis. No joint deformity upper and lower extremities. Skin: no rashes, lesions, ulcers. No induration Neurologic:  No apparent cranial nerve abnormality, moving extremities spontaneously Psychiatric: Normal judgment and insight.  Alert and oriented x 3. Normal mood.   Labs on Admission: I have personally reviewed following labs and imaging studies  CBC: Recent Labs  Lab 09/15/22 1301  WBC 6.0  NEUTROABS 4.8  HGB 12.5  HCT 37.6  MCV 95.4  PLT 207   Basic Metabolic Panel:  Recent Labs  Lab 09/15/22 1301  NA 134*  K 3.9  CL 99  CO2 24  GLUCOSE 310*  BUN 25*  CREATININE 1.56*  CALCIUM 8.9  MG 1.4*   GFR: Estimated Creatinine Clearance: 24.3 mL/min (A) (by C-G formula based on SCr of 1.56 mg/dL (H)). Liver Function Tests: Recent Labs  Lab 09/15/22 1301  AST 21  ALT 12  ALKPHOS 65  BILITOT 0.9  PROT 7.1  ALBUMIN 3.7   Recent Labs  Lab 09/15/22 1301  LIPASE 49   Recent Labs  Lab 09/15/22 1300  AMMONIA 22   CBG: Recent Labs  Lab 09/15/22 1251 09/15/22 1659  GLUCAP 274* 143*    Urine analysis:    Component Value Date/Time   COLORURINE STRAW (A) 09/15/2022 1510   APPEARANCEUR CLEAR 09/15/2022 1510   LABSPEC 1.010 09/15/2022 1510   PHURINE 6.0 09/15/2022 1510   GLUCOSEU 150 (A) 09/15/2022 1510   HGBUR NEGATIVE 09/15/2022 1510   BILIRUBINUR NEGATIVE 09/15/2022 1510   KETONESUR NEGATIVE 09/15/2022 1510   PROTEINUR 30 (A) 09/15/2022 1510   NITRITE NEGATIVE 09/15/2022 1510   LEUKOCYTESUR NEGATIVE 09/15/2022 1510    Radiological Exams on Admission: DG Chest Port 1 View  Result Date: 09/15/2022 CLINICAL DATA:  Altered mental status. EXAM: PORTABLE CHEST 1 VIEW COMPARISON:  01/15/2022 and prior radiographs FINDINGS: Telemetry leads overlie the chest. Cardiomegaly again noted. There is no evidence of focal airspace disease, pulmonary edema, suspicious pulmonary nodule/mass, pleural effusion, or pneumothorax. No acute bony abnormalities are identified. IMPRESSION: Cardiomegaly without evidence of acute cardiopulmonary disease. Electronically Signed   By: Harmon Pier M.D.   On: 09/15/2022 14:13   CT Head Wo Contrast  Result Date: 09/15/2022 CLINICAL DATA:  Altered mental status EXAM:  CT HEAD WITHOUT CONTRAST TECHNIQUE: Contiguous axial images were obtained from the base of the skull through the vertex without intravenous contrast. RADIATION DOSE REDUCTION: This exam was performed according to the departmental dose-optimization program which includes automated exposure control, adjustment of the mA and/or kV according to patient size and/or use of iterative reconstruction technique. COMPARISON:  09/11/2017 FINDINGS: Brain: Periventricular white matter and corona radiata hypodensities favor chronic ischemic microvascular white matter disease. Otherwise, the brainstem, cerebellum, cerebral peduncles, thalamus, basal ganglia, basilar cisterns, and ventricular system appear within normal limits. No intracranial hemorrhage, mass lesion, or acute CVA. Vascular: There is atherosclerotic calcification of the cavernous carotid arteries bilaterally. Skull: Right craniotomy.  Hyperostosis frontalis interna. Sinuses/Orbits: Chronic right maxillary and left sphenoid sinusitis. Other: No supplemental non-categorized findings. IMPRESSION: 1. No acute intracranial findings. 2. Periventricular white matter and corona radiata hypodensities favor chronic ischemic microvascular white matter disease. 3. Atherosclerosis. 4. Chronic right maxillary and left sphenoid sinusitis. 5. Right craniotomy. Electronically Signed   By: Gaylyn Rong M.D.   On: 09/15/2022 13:47    EKG: Independently reviewed.  Atrial fibrillation, rate 80, QTc 463.  No significant change from prior  Assessment/Plan Principal Problem:   Acute metabolic encephalopathy Active Problems:   Diabetes mellitus with stage 3 chronic kidney disease (HCC)   Primary hypertension   Permanent atrial fibrillation (HCC)   Long term (current) use of anticoagulants   Pressure injury of skin    Assessment and Plan: * Acute metabolic encephalopathy Etiology is yet undetermined.  Reported subjective fevers.  No history to suggest dehydration.  T  max- 99.8.  Rules out for sepsis.  WBC 6.  Lactic acid 1.6.  Left lower quadrant and suprapubic tenderness.  UA not suggestive of infection.  Chest x-ray clear.  Head CT unremarkable. -Obtain CT abdomen and pelvis with without contrast - ~500 ml  given, continue N/s 75cc/hr x 15hrs -IV Levaquin given, hold off on further antibiotics for now  Permanent atrial fibrillation (HCC) Rate controlled on anticoagulation with Eliquis -Resume Eliquis and metoprolol  Primary hypertension Stable. -Resume losartan-HCTZ in a.m.  Diabetes mellitus with stage 3 chronic kidney disease (HCC) - SSI- S -Resume home Lantus at reduced dose 10 units daily - Hold glipizide - Hgba1c  Pressure injury of skin Stage1 sacral decubitus  ulcer.   DVT prophylaxis: Eliquis Code Status: FULL Code-Confirmed with daughter Olegario Messier at bedside Family Communication: Daughter Olegario Messier at bedside, is [employer]. Disposition Plan: ~ 2 days Consults called: Home Admission status:  Obs tele   Author: Onnie Boer, MD 09/15/2022 5:44 PM  For on call review [email or web address].

## 2022-09-15 NOTE — Assessment & Plan Note (Addendum)
Rate controlled on anticoagulation with Eliquis -Resume Eliquis and metoprolol

## 2022-09-15 NOTE — Assessment & Plan Note (Signed)
-  A1c 6.8 -Resume home hypoglycemic regimen and continue outpatient follow-up with endocrinologist. -Modified carbohydrate diet has been recommended -Patient advised to maintain adequate hydration -Renal function is stable and at baseline for her.

## 2022-09-16 ENCOUNTER — Telehealth: Payer: Self-pay | Admitting: Family Medicine

## 2022-09-16 DIAGNOSIS — E538 Deficiency of other specified B group vitamins: Secondary | ICD-10-CM

## 2022-09-16 DIAGNOSIS — K5732 Diverticulitis of large intestine without perforation or abscess without bleeding: Secondary | ICD-10-CM

## 2022-09-16 DIAGNOSIS — R531 Weakness: Secondary | ICD-10-CM

## 2022-09-16 DIAGNOSIS — G9341 Metabolic encephalopathy: Secondary | ICD-10-CM | POA: Diagnosis not present

## 2022-09-16 DIAGNOSIS — I10 Essential (primary) hypertension: Secondary | ICD-10-CM | POA: Diagnosis not present

## 2022-09-16 LAB — CBC
HCT: 34.3 % — ABNORMAL LOW (ref 36.0–46.0)
Hemoglobin: 11.4 g/dL — ABNORMAL LOW (ref 12.0–15.0)
MCH: 31.3 pg (ref 26.0–34.0)
MCHC: 33.2 g/dL (ref 30.0–36.0)
MCV: 94.2 fL (ref 80.0–100.0)
Platelets: 203 10*3/uL (ref 150–400)
RBC: 3.64 MIL/uL — ABNORMAL LOW (ref 3.87–5.11)
RDW: 14.4 % (ref 11.5–15.5)
WBC: 5.2 10*3/uL (ref 4.0–10.5)
nRBC: 0 % (ref 0.0–0.2)

## 2022-09-16 LAB — HEMOGLOBIN A1C
Hgb A1c MFr Bld: 6.8 % — ABNORMAL HIGH (ref 4.8–5.6)
Mean Plasma Glucose: 148.46 mg/dL

## 2022-09-16 LAB — BASIC METABOLIC PANEL
Anion gap: 10 (ref 5–15)
BUN: 19 mg/dL (ref 8–23)
CO2: 25 mmol/L (ref 22–32)
Calcium: 8.8 mg/dL — ABNORMAL LOW (ref 8.9–10.3)
Chloride: 102 mmol/L (ref 98–111)
Creatinine, Ser: 1.3 mg/dL — ABNORMAL HIGH (ref 0.44–1.00)
GFR, Estimated: 40 mL/min — ABNORMAL LOW (ref >60)
Glucose, Bld: 107 mg/dL — ABNORMAL HIGH (ref 70–99)
Potassium: 3.5 mmol/L (ref 3.5–5.1)
Sodium: 137 mmol/L (ref 135–145)

## 2022-09-16 LAB — GLUCOSE, CAPILLARY
Glucose-Capillary: 124 mg/dL — ABNORMAL HIGH (ref 70–99)
Glucose-Capillary: 149 mg/dL — ABNORMAL HIGH (ref 70–99)

## 2022-09-16 MED ORDER — CIPROFLOXACIN HCL 500 MG PO TABS: 500.0000 mg | ORAL_TABLET | Freq: Two times a day (BID) | ORAL | 0 refills | Status: AC

## 2022-09-16 MED ORDER — METRONIDAZOLE 500 MG PO TABS: 500.0000 mg | ORAL_TABLET | Freq: Two times a day (BID) | ORAL | 0 refills | Status: AC

## 2022-09-16 MED ORDER — TRIAMCINOLONE ACETONIDE 0.1 % EX CREA: 1.0000 | TOPICAL_CREAM | Freq: Two times a day (BID) | CUTANEOUS | Status: DC | PRN

## 2022-09-16 MED ORDER — ONDANSETRON 8 MG PO TBDP: 8.0000 mg | ORAL_TABLET | Freq: Three times a day (TID) | ORAL | 0 refills | Status: DC | PRN

## 2022-09-16 MED ORDER — CYANOCOBALAMIN 1000 MCG/ML IJ SOLN
1000.0000 ug | Freq: Once | INTRAMUSCULAR | Status: AC
  Administered 2022-09-16: 1000 ug via INTRAMUSCULAR
  Filled 2022-09-16: qty 1

## 2022-09-16 MED ORDER — LOSARTAN POTASSIUM 50 MG PO TABS: 50.0000 mg | ORAL_TABLET | Freq: Every day | ORAL | 1 refills | Status: DC

## 2022-09-16 MED ORDER — APIXABAN 2.5 MG PO TABS
2.5000 mg | ORAL_TABLET | Freq: Two times a day (BID) | ORAL | Status: DC
  Administered 2022-09-16: 2.5 mg via ORAL
  Filled 2022-09-16: qty 1

## 2022-09-16 MED ORDER — VITAMIN B-12 1000 MCG PO TABS: 1000.0000 ug | ORAL_TABLET | Freq: Every day | ORAL | 2 refills | Status: DC

## 2022-09-16 NOTE — Telephone Encounter (Signed)
Patient daughter [name] called said her mother is in the [facility] and running some test went in for confusion, she has some questions to ask [place] Patient daughter [name] asked for a call back today please [phone]

## 2022-09-16 NOTE — Assessment & Plan Note (Signed)
-  Continue B12 supplementation and follow levels.

## 2022-09-16 NOTE — Progress Notes (Signed)
Transition of Care [facility]) - Inpatient Brief Assessment   Patient Details  Name: [name] MRN: [id number] Date of Birth: 07/19/1934  Transition of Care [facility]) CM/SW Contact:    Leitha Bleak, RN Phone Number: 09/16/2022, 11:04 AM   Clinical Narrative:  Admitted with acute metabolic encephalopathy. No needs identified. TOC following   Transition of Care Asessment: Insurance and Status: (P) Insurance coverage has been reviewed Patient has primary care physician: (P) Yes Home environment has been reviewed: (P) Home Prior level of function:: (P) needs assistance Prior/Current Home Services: (P) No current home services Social Determinants of Health Reivew: (P) SDOH reviewed no interventions necessary Readmission risk has been reviewed: (P) Yes Transition of care needs: (P) no transition of care needs at this time

## 2022-09-16 NOTE — Assessment & Plan Note (Signed)
-  No signs of overt bleeding appreciated -Continue treatment with Eliquis.

## 2022-09-16 NOTE — Discharge Summary (Signed)
Physician Discharge Summary   Patient: [name] MRN: [id number] DOB: 1935/02/18  Admit date:     09/15/2022  Discharge date: 09/16/22  Discharge Physician: Vassie Loll   PCP: Rica Records, FNP   Recommendations at discharge:  Repeat basic metabolic panel to follow ultralights renal function Repeat CBC to follow hemoglobin trend and stability. Outpatient follow-up with gastroenterology service for colonoscopy evaluation in 4-6 weeks recommended Repeat B12 level in 81-month.  Discharge Diagnoses: Principal Problem:   Acute metabolic encephalopathy Active Problems:   Diabetes mellitus with stage 3 chronic kidney disease (HCC)   Primary hypertension   Permanent atrial fibrillation (HCC)   Long term (current) use of anticoagulants   Pressure injury of skin   Diverticulitis of large intestine without perforation or abscess without bleeding   Weakness   B12 deficiency  Brief Hospital admission narrative course: As per H&P written by Dr. Mariea [name] on [address] [name] is a 88 y.o. female with medical history significant for DM, HTN, OSA, Atria Fib.  Patient presented to the ED with complaints of generalized weakness, confusion and fatigue.  At the time of my evaluation, patient is still confused, she is able to answer simple questions, but unable to give me history.  Her daughter [name] is at bedside and provides the history.  She reports confusion started 2 days ago- with patients speech not making sense, yesterday it improved but today it got worse, and patient had subjective fevers which she felt today.  She has maintained good oral intake.  No vomiting, had 1 episode of loose stools today.  Has a mild chronic cough, no difficulty breathing.  No complaints of chest pain.  At baseline she has no memory problems and ambulates with a walker.   ED Course: Tmax of 99.8.  Heart rate 60s - 70s, respirate rate 18-25.  Blood pressure systolic 120s to 409W.  O2 sats greater than  95% on room air.  Troponin 11.  UA unremarkable.  Ammonia 22.  Lactic acid 1.6.  Head CT negative for acute intracranial abnormality.  Chest x-ray clear. IV Levaquin given.  Assessment and Plan: * Acute metabolic encephalopathy -Appears to be secondary to dehydration, low-grade temperature and acute diverticulitis process -Patient has been discharged home on oral antibiotics and instruction to maintain adequate hydration and supportive care -Outpatient follow-up with PCP -CT scan of the head demonstrated no acute intracranial normalities -No focal deficit appreciated.   -B12 found to be low and repletion/maintenance supplementation started.  -Obtain CT abdomen and pelvis with without contrast - ~500 ml  given, continue N/s 75cc/hr x 15hrs -IV Levaquin given, hold off on further antibiotics for now  Long term (current) use of anticoagulants -No signs of overt bleeding appreciated -Continue treatment with Eliquis.  Permanent atrial fibrillation (HCC) -Continue treatment with Eliquis and the use of home rate controlling agent -Outpatient follow-up with cardiology service.  Primary hypertension -Overall stable and well-controlled -Patient has been discharged home on her usual antihypertensive treatment agents except for HCTZ (in order to minimize chances for dehydration and electrolytes abnormalities). -Heart healthy/low-sodium diet recommended.  Diabetes mellitus with stage 3 chronic kidney disease (HCC) -A1c 6.8 -Resume home hypoglycemic regimen and continue outpatient follow-up with endocrinologist. -Modified carbohydrate diet has been recommended -Patient advised to maintain adequate hydration -Renal function is stable and at baseline for her.  B12 deficiency -Continue B12 supplementation and follow levels.  Weakness -In the setting of dehydration, B12 deficiency -Fluid resuscitation provided -B12 supplementation has been  started.  Diverticulitis of large intestine without  perforation or abscess without bleeding -Noncomplicated -Images demonstrating no abscess or rupture/perforation. -Advised to follow soft diet and to maintain adequate hydration -As needed antiemetics and antibiotic course has been provided -Outpatient follow-up with GI recommended (in 4 to 6 weeks).  Pressure injury of skin -Stage 1 sacral decubitus  ulcer. -Continue constant repositioning; no signs of superimposed infection.  Consultants: None Procedures performed: See below for x-ray reports. Disposition: Home Diet recommendation: Heart healthy modified carbohydrate diet.  DISCHARGE MEDICATION: Allergies as of 09/16/2022       Reactions   Adhesive [tape] Itching   Very sensitive skin, tears easily   Demerol [meperidine] Nausea And Vomiting   Iohexol Nausea And Vomiting   Patient states she almost died from severe n/v    Shellfish Allergy Diarrhea, Nausea And Vomiting   Penicillins Nausea And Vomiting, Rash        Medication List     STOP taking these medications    gabapentin 300 MG capsule Commonly known as: NEURONTIN   losartan-hydrochlorothiazide 50-12.5 MG tablet Commonly known as: HYZAAR       TAKE these medications    albuterol 108 (90 Base) MCG/ACT inhaler Commonly known as: VENTOLIN HFA Inhale 2 puffs into the lungs every 6 (six) hours as needed for wheezing or shortness of breath.   amLODipine 5 MG tablet Commonly known as: NORVASC Take 5 mg by mouth daily.   ciprofloxacin 500 MG tablet Commonly known as: Cipro Take 1 tablet (500 mg total) by mouth 2 (two) times daily for 8 days.   cyanocobalamin 1000 MCG tablet Commonly known as: VITAMIN B12 Take 1 tablet (1,000 mcg total) by mouth daily.   Eliquis 2.5 MG Tabs tablet Generic drug: apixaban TAKE 1 TABLET BY MOUTH TWICE DAILY What changed: how much to take   FreeStyle Libre 2 Reader Hardie Pulley As directed   [employer] 2 Sensor Misc 1 Piece by Does not apply route every 14 (fourteen)  days.   glipiZIDE 5 MG 24 hr tablet Commonly known as: GLUCOTROL XL Take 1 tablet (5 mg total) by mouth daily with breakfast.   Global Ease Inject Pen Needles 31G X 5 MM Misc Generic drug: Insulin Pen Needle USE ONE TIP WITH INSULIN AT BEDTIME   Lantus SoloStar 100 UNIT/ML Solostar Pen Generic drug: insulin glargine Inject 16 Units into the skin at bedtime. What changed: how much to take   losartan 50 MG tablet Commonly known as: COZAAR Take 1 tablet (50 mg total) by mouth daily. Start taking on: September 17, 2022   metoprolol tartrate 25 MG tablet Commonly known as: LOPRESSOR TAKE 1/2 TABLET BY MOUTH TWICE DAILY   metroNIDAZOLE 500 MG tablet Commonly known as: Flagyl Take 1 tablet (500 mg total) by mouth 2 (two) times daily for 8 days.   nitroGLYCERIN 0.4 MG SL tablet Commonly known as: NITROSTAT PLACE 1 TABLET UNDER THE TONGUE EVERY 5 MINUTES FOR 3 DOSES AS NEEDED FOR CHEST PAIN. IF YOU HAVE NO RELIEF AFTER THE 3RD DOSE PROCEED TO THE ED FOR AN EVALUATION   ondansetron 8 MG disintegrating tablet Commonly known as: ZOFRAN-ODT Take 1 tablet (8 mg total) by mouth every 8 (eight) hours as needed for nausea or vomiting.   pantoprazole 40 MG tablet Commonly known as: PROTONIX TAKE 1 TABLET BY MOUTH TWICE DAILY   simvastatin 20 MG tablet Commonly known as: ZOCOR Take 20 mg by mouth daily.   triamcinolone cream 0.1 % Commonly known as:  KENALOG Apply 1 Application topically 2 (two) times daily as needed (irritation).        Follow-up Information     Del Newman Nip, Tenna Child, [place]. Schedule an appointment as soon as possible for a visit in 10 day(s).   Specialty: Family Medicine Contact information: [address] [place] [address] [phone]                Discharge Exam: Ceasar Mons Weights   09/15/22 1253 09/15/22 1700  Weight: 74.4 kg 71.2 kg   General exam: Afebrile, in no acute distress, tolerating diet and following commands appropriately.   Feeling ready to go home with oral antibiotic therapy. Respiratory system: Clear to auscultation. Respiratory effort normal.  Good saturation on room air. Cardiovascular system: Rate controlled, no rubs, no gallops, no JVD. Gastrointestinal system: Abdomen is nondistended, soft and nontender. No organomegaly or masses felt. Normal bowel sounds heard. Central nervous system: Alert and oriented. No focal neurological deficits. Extremities: No cyanosis or clubbing. Skin: No petechiae. Psychiatry: Judgement and insight appear normal. Mood & affect appropriate.    Condition at discharge: Stable and improved.  The results of significant diagnostics from this hospitalization (including imaging, microbiology, ancillary and laboratory) are listed below for reference.   Imaging Studies: CT ABDOMEN PELVIS WO CONTRAST  Result Date: 09/15/2022 CLINICAL DATA:  Left lower quadrant abdominal pain, low-grade temperature. EXAM: CT ABDOMEN AND PELVIS WITHOUT CONTRAST TECHNIQUE: Multidetector CT imaging of the abdomen and pelvis was performed following the standard protocol without IV contrast. RADIATION DOSE REDUCTION: This exam was performed according to the departmental dose-optimization program which includes automated exposure control, adjustment of the mA and/or kV according to patient size and/or use of iterative reconstruction technique. COMPARISON:  11/01/2017. FINDINGS: Lower chest: Heart is enlarged and coronary artery calcifications are noted. There is a small pericardial effusion. Mild atelectasis and calcifications are noted in the lower lobes bilaterally. Hepatobiliary: No focal liver abnormality is seen. No gallstones, gallbladder wall thickening, or biliary dilatation. Pancreas: Unremarkable. No pancreatic ductal dilatation or surrounding inflammatory changes. Spleen: Normal in size without focal abnormality. Adrenals/Urinary Tract: The adrenal glands are within normal limits. There is chronic severe  hydronephrosis on the right at the level of the UPJ with renal cortical thinning. A nonobstructive right renal calculus is noted. A cyst is present in the lower pole of the left kidney. No obstructive uropathy on the left. The bladder is unremarkable. Stomach/Bowel: Stomach is within normal limits. Appendix is not seen. No bowel obstruction, free air or pneumatosis. A moderate amount of retained stool is present in the colon. Scattered diverticula are present along the colon. There is mild colonic wall thickening with surrounding fat stranding at a diverticulum in the sigmoid colon, compatible with diverticulitis. No abscess is seen. Vascular/Lymphatic: Aortic atherosclerosis. Nonspecific prominent lymph nodes are noted in the periaortic space on the left, not significantly changed from the prior exam. Reproductive: Status post hysterectomy. No adnexal masses. Other: No abdominopelvic ascites. Musculoskeletal: Degenerative changes are present in the thoracolumbar spine. Grade 2 anterolisthesis is noted at L5-S1. No acute or suspicious osseous abnormality. IMPRESSION: 1. Sigmoid diverticulitis.  No abscess or free air is seen. 2. 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 3. Nonobstructive right renal calculus. 4. Aortic atherosclerosis and coronary artery calcifications. Electronically Signed   By: Thornell Sartorius M.D.   On: 09/15/2022 22:44   DG Chest Port 1 View  Result Date: 09/15/2022 CLINICAL DATA:  Altered mental status. EXAM: PORTABLE CHEST 1 VIEW COMPARISON:  01/15/2022 and prior radiographs FINDINGS: Telemetry leads overlie the chest. Cardiomegaly again noted. There is no evidence of focal airspace disease, pulmonary edema, suspicious pulmonary nodule/mass, pleural effusion, or pneumothorax. No acute bony abnormalities are identified. IMPRESSION: Cardiomegaly without evidence of acute cardiopulmonary disease. Electronically Signed   By: Harmon Pier M.D.   On: 09/15/2022 14:13   CT Head Wo Contrast  Result Date: 09/15/2022 CLINICAL DATA:  Altered mental status EXAM: CT HEAD WITHOUT CONTRAST TECHNIQUE: Contiguous axial images were obtained from the base of the skull through the vertex without intravenous contrast. RADIATION DOSE REDUCTION: This exam was performed according to the departmental dose-optimization program which includes automated exposure control, adjustment of the mA and/or kV according to patient size and/or use of iterative reconstruction technique. COMPARISON:  09/11/2017 FINDINGS: Brain: Periventricular white matter and corona radiata hypodensities favor chronic ischemic microvascular white matter disease. Otherwise, the brainstem, cerebellum, cerebral peduncles, thalamus, basal ganglia, basilar cisterns, and ventricular system appear within normal limits. No intracranial hemorrhage, mass lesion, or acute CVA. Vascular: There is atherosclerotic calcification of the cavernous carotid arteries bilaterally. Skull: Right craniotomy.  Hyperostosis frontalis interna. Sinuses/Orbits: Chronic right maxillary and left sphenoid sinusitis. Other: No supplemental non-categorized findings. IMPRESSION: 1. No acute intracranial findings. 2. Periventricular white matter and corona radiata hypodensities favor chronic ischemic microvascular white matter disease. 3. Atherosclerosis. 4. Chronic right maxillary and left sphenoid sinusitis. 5. Right craniotomy. Electronically Signed   By: Gaylyn Rong M.D.   On: 09/15/2022 13:47    Microbiology: Results for orders placed or performed during the hospital encounter of 09/15/22  SARS Coronavirus 2 by RT PCR (hospital order, performed in [facility] hospital lab) *cepheid single result test* Anterior Nasal Swab     Status: None   Collection Time: 09/15/22  4:04 PM   Specimen: Anterior Nasal Swab  Result Value Ref Range Status   SARS Coronavirus 2 by RT PCR NEGATIVE NEGATIVE Final    Comment:  (NOTE) SARS-CoV-2 target nucleic acids are NOT DETECTED.  The SARS-CoV-2 RNA is generally detectable in upper and lower respiratory specimens during the acute phase of infection. The lowest concentration of SARS-CoV-2 viral copies this assay can detect is 250 copies / mL. A negative result does not preclude SARS-CoV-2 infection and should not be used as the sole basis for treatment or other patient management decisions.  A negative result may occur with improper specimen collection / handling, submission of specimen other than nasopharyngeal swab, presence of viral mutation(s) within the areas targeted by this assay, and inadequate number of viral copies (<250 copies / mL). A negative result must be combined with clinical observations, patient history, and epidemiological information.  Fact Sheet for Patients:   RoadLapTop.co.za  Fact Sheet for Healthcare Providers: http://kim-miller.com/  This test is not yet approved or  cleared by the [place] FDA and has been authorized for detection and/or diagnosis of SARS-CoV-2 by FDA under an Emergency Use Authorization (EUA).  This EUA will remain in effect (meaning this test can be used) for the duration of the COVID-19 declaration under Section 564(b)(1) of the Act, 21 U.S.C. section 360bbb-3(b)(1), unless the authorization is terminated or revoked sooner.  Performed at [facility], [address] [place], [place] [address]     Labs: CBC: Recent Labs  Lab 09/15/22 1301 09/16/22 0444  WBC 6.0 5.2  NEUTROABS 4.8  --   HGB 12.5 11.4*  HCT 37.6 34.3*  MCV 95.4  94.2  PLT 207 203   Basic Metabolic Panel: Recent Labs  Lab 09/15/22 1301 09/16/22 0444  NA 134* 137  K 3.9 3.5  CL 99 102  CO2 24 25  GLUCOSE 310* 107*  BUN 25* 19  CREATININE 1.56* 1.30*  CALCIUM 8.9 8.8*  MG 1.4*  --    Liver Function Tests: Recent Labs  Lab 09/15/22 1301  AST 21  ALT 12  ALKPHOS 65   BILITOT 0.9  PROT 7.1  ALBUMIN 3.7   CBG: Recent Labs  Lab 09/15/22 1251 09/15/22 1659 09/15/22 2111 09/16/22 0709 09/16/22 1117  GLUCAP 274* 143* 114* 124* 149*    Discharge time spent: greater than 30 minutes.  Signed: Vassie Loll, MD [facility] 09/16/2022

## 2022-09-16 NOTE — Assessment & Plan Note (Signed)
-  In the setting of dehydration, B12 deficiency -Fluid resuscitation provided -B12 supplementation has been started.

## 2022-09-16 NOTE — Assessment & Plan Note (Signed)
-  Noncomplicated -Images demonstrating no abscess or rupture/perforation. -Advised to follow soft diet and to maintain adequate hydration -As needed antiemetics and antibiotic course has been provided -Outpatient follow-up with GI recommended (in 4 to 6 weeks).

## 2022-09-17 ENCOUNTER — Telehealth: Payer: Self-pay

## 2022-09-17 ENCOUNTER — Ambulatory Visit: Payer: Medicare Other | Admitting: "Endocrinology

## 2022-09-17 NOTE — Transitions of Care (Post Inpatient/ED Visit) (Signed)
09/17/2022  Name: [name] MRN: [id number] DOB: Jan 19, 1935  Today's TOC FU Call Status: Today's TOC FU Call Status:: Successful TOC FU Call Competed TOC FU Call Complete Date: 09/17/22  Transition Care Management Follow-up Telephone Call Date of Discharge: 09/16/22 [facility] Type of Discharge: Inpatient Admission Primary Inpatient Discharge Diagnosis:: weakness How have you been since you were released from the hospital?: Same Any questions or concerns?: No  Items Reviewed: Did you receive and understand the discharge instructions provided?: Yes Medications obtained,verified, and reconciled?: Yes (Medications Reviewed) Any new allergies since your discharge?: No Dietary orders reviewed?: Yes Do you have support at home?: Yes People in Home: child(ren), adult  Medications Reviewed Today: Medications Reviewed Today     Reviewed by Karena Addison, LPN (Licensed Practical Nurse) on 09/17/22 at 1012  Med List Status: <None>   Medication Order Taking? Sig Documenting Provider Last Dose Status Informant  albuterol (VENTOLIN HFA) 108 (90 Base) MCG/ACT inhaler [id number] No Inhale 2 puffs into the lungs every 6 (six) hours as needed for wheezing or shortness of breath.  Patient not taking: Reported on 09/15/2022   Del Nigel Berthold, FNP Not Taking Active Child, Pharmacy Records  amLODipine [facility]) 5 MG tablet [id number] Yes Take 5 mg by mouth daily. [provider] Taking Active Child, Pharmacy Records  apixaban (ELIQUIS) 2.5 MG TABS tablet [id number] Yes TAKE 1 TABLET BY MOUTH TWICE DAILY  Patient taking differently: Take 2.5 mg by mouth 2 (two) times daily.   Jonelle Sidle, MD Taking Active Child, Pharmacy Records  ciprofloxacin (CIPRO) 500 MG tablet [id number] Yes Take 1 tablet (500 mg total) by mouth 2 (two) times daily for 8 days. Vassie Loll, MD Taking Active   Continuous Blood Gluc Receiver (FREESTYLE LIBRE 2 READER) Hardie Pulley  [id number] Yes As directed Nida, Denman George, MD Taking Active Child, Pharmacy Records  Continuous Blood Gluc Sensor (FREESTYLE LIBRE 2 SENSOR) [place] [id number] Yes 1 Piece by Does not apply route every 14 (fourteen) days. Roma Kayser, MD Taking Active Child, Pharmacy Records  cyanocobalamin (VITAMIN B12) 1000 MCG tablet [id number] Yes Take 1 tablet (1,000 mcg total) by mouth daily. Vassie Loll, MD Taking Active   glipiZIDE (GLUCOTROL XL) 5 MG 24 hr tablet [id number] Yes Take 1 tablet (5 mg total) by mouth daily with breakfast. Roma Kayser, MD Taking Active Child, Pharmacy Records  GLOBAL EASE INJECT PEN NEEDLES 31G X 5 MM MISC [id number] Yes USE ONE TIP WITH INSULIN AT BEDTIME Roma Kayser, MD Taking Active Child, Pharmacy Records  insulin glargine (LANTUS SOLOSTAR) 100 UNIT/ML Solostar Pen [id number] Yes Inject 16 Units into the skin at bedtime.  Patient taking differently: Inject 20 Units into the skin at bedtime.   Roma Kayser, MD Taking Active Child, Pharmacy Records  losartan (COZAAR) 50 MG tablet [id number] Yes Take 1 tablet (50 mg total) by mouth daily. Vassie Loll, MD Taking Active   metoprolol tartrate (LOPRESSOR) 25 MG tablet [id number] Yes TAKE 1/2 TABLET BY MOUTH TWICE DAILY Jonelle Sidle, MD Taking Active Child, Pharmacy Records  metroNIDAZOLE (FLAGYL) 500 MG tablet [id number] Yes Take 1 tablet (500 mg total) by mouth 2 (two) times daily for 8 days. Vassie Loll, MD Taking Active   nitroGLYCERIN (NITROSTAT) 0.4 MG SL tablet [id number] No PLACE 1 TABLET UNDER THE TONGUE EVERY 5 MINUTES FOR 3 DOSES AS NEEDED FOR CHEST PAIN. IF YOU HAVE NO RELIEF AFTER THE 3RD DOSE PROCEED TO THE ED FOR AN EVALUATION  Patient not taking: Reported on 06/21/2022   Jonelle Sidle, MD Not Taking Active Child, Pharmacy Records  ondansetron (ZOFRAN-ODT) 8 MG disintegrating tablet [id number] Yes Take 1 tablet (8 mg total) by mouth every 8 (eight) hours as needed for  nausea or vomiting. Vassie Loll, MD Taking Active   pantoprazole (PROTONIX) 40 MG tablet [id number] Yes TAKE 1 TABLET BY MOUTH TWICE DAILY  Patient taking differently: Take 40 mg by mouth 2 (two) times daily.   [name], Gerrit Friends, MD Taking Active Child, Pharmacy Records  simvastatin (ZOCOR) 20 MG tablet [id number] Yes Take 20 mg by mouth daily.  [provider] Taking Active Child, Pharmacy Records           Med Note Thomasene Lot Sep 11, 2017 11:27 PM)    triamcinolone cream (KENALOG) 0.1 % [id number] Yes Apply 1 Application topically 2 (two) times daily as needed (irritation). Vassie Loll, MD Taking Active             Home Care and Equipment/Supplies: Were Home Health Services Ordered?: NA Any new equipment or medical supplies ordered?: NA  Functional Questionnaire: Do you need assistance with bathing/showering or dressing?: Yes Do you need assistance with meal preparation?: Yes Do you need assistance with eating?: No Do you have difficulty maintaining continence: Yes Do you need assistance with getting out of bed/getting out of a chair/moving?: Yes Do you have difficulty managing or taking your medications?: Yes  Follow up appointments reviewed: PCP Follow-up appointment confirmed?: No (declined) Specialist Hospital Follow-up appointment confirmed?: NA Do you need transportation to your follow-up appointment?: No Do you understand care options if your condition(s) worsen?: Yes-patient verbalized understanding    SIGNATURE Karena Addison, LPN [facility] Nurse Health Advisor Direct Dial [phone]

## 2022-09-20 ENCOUNTER — Ambulatory Visit (INDEPENDENT_AMBULATORY_CARE_PROVIDER_SITE_OTHER): Payer: Medicare Other | Admitting: Internal Medicine

## 2022-09-20 ENCOUNTER — Ambulatory Visit: Payer: Medicare Other | Admitting: Family Medicine

## 2022-09-20 ENCOUNTER — Encounter: Payer: Self-pay | Admitting: Internal Medicine

## 2022-09-20 VITALS — BP 115/63 | HR 86 | Ht 63.0 in | Wt 159.8 lb

## 2022-09-20 DIAGNOSIS — G9341 Metabolic encephalopathy: Secondary | ICD-10-CM | POA: Diagnosis not present

## 2022-09-20 DIAGNOSIS — N179 Acute kidney failure, unspecified: Secondary | ICD-10-CM

## 2022-09-20 DIAGNOSIS — K5792 Diverticulitis of intestine, part unspecified, without perforation or abscess without bleeding: Secondary | ICD-10-CM

## 2022-09-20 DIAGNOSIS — K5732 Diverticulitis of large intestine without perforation or abscess without bleeding: Secondary | ICD-10-CM

## 2022-09-20 DIAGNOSIS — N189 Chronic kidney disease, unspecified: Secondary | ICD-10-CM | POA: Insufficient documentation

## 2022-09-20 NOTE — Assessment & Plan Note (Signed)
AKI on CKD noted during recent admission.  Prerenal etiology suspected in the setting of dehydration.  Treated with IV fluids.  Renal function trending to baseline prior to discharge. -Repeat BMP ordered today

## 2022-09-20 NOTE — Progress Notes (Signed)
Established Patient Office Visit  Subjective   Patient ID: [name], female    DOB: 11/03/1934  Age: 88 y.o. MRN: [id number]  Chief Complaint  Patient presents with   Transitions Of Care    D/c 09/16/2022   [name] presents today for hospital follow-up.  Recent hospital admission 6/9 - 6/10 in the setting of acute metabolic encephalopathy.  She was ultimately found to be dehydrated and to have acute diverticulitis.  Treated with IV antibiotics, fluid resuscitated, and discharged 6/10 on Cipro.  Mentation improved prior to hospital discharge.  Stroke workup was negative.  [name] reports feeling fairly well today.  She endorses abdominal discomfort that she partially attributes to not having a bowel movement since Monday (6/10).  She has been eating and drinking well.  She is accompanied by her daughter today, who has no acute concerns to discuss.  She feels as though her mother has significantly improved since the time of hospital admission.   Past Medical History:  Diagnosis Date   Anxiety    Asthma    Chronic back pain    Colonoscopy causing post-procedural bleeding    Incomplete. by Dr. Jena Gauss 06/11/99 due to fixed non-compliant colon precluded exam to 40 cm, she had subsequent colectomy for diverticulitis since that time.    Diarrhea    Esophageal reflux    Essential hypertension    Fibromyalgia    Hiatal hernia    Recent EGD/TCS showed small hiatal hernia, normal apperaring tubular esophagus. s/p passage of a 54-french Maloney dilator, s/p biopsy esophageal mucosa, multiple fundal gland type gastric polyps. one large pedunculated polp with oozing, noted s/p clipping and snare polypectomy. Biopsies of the gastric mucosa taken given her symptoms in her elevated count. normal D1-D3, s/p biospy D2-D3.    Hiatal hernia    all bx unremarkable. on TCS she had left-sided diverticula, evidence of prior segmental resection with anastomosis, multiple colonic polyps s/p multiple  snare polypectomies, s/p segmental biopsy and stool sampling. she had multiple tubular adenomas. no microscopic/collagenous colitis. stool culture, c diff, O+P, lactoferrin were negative.    Mixed hyperlipidemia    Nephrolithiasis    OSA (obstructive sleep apnea)    Permanent atrial fibrillation (HCC)    Previous failed DCCV   Type 2 diabetes mellitus (HCC)    Ureteral stent retained    Not retained. ureteral stent removal gross hematuria resolved 10/11. coumadin restarted.    Ventral hernia    Past Surgical History:  Procedure Laterality Date   AGILE CAPSULE N/A 08/17/2020   Procedure: AGILE CAPSULE;  Surgeon: Corbin Ade, MD;  Location: [facility];  Service: Endoscopy;  Laterality: N/A;  7:30am   BREAST LUMPECTOMY     benign cyst   CATARACT EXTRACTION W/PHACO Left 08/17/2012   Procedure: CATARACT EXTRACTION PHACO AND INTRAOCULAR LENS PLACEMENT (IOC);  Surgeon: Gemma Payor, MD;  Location: [facility];  Service: Ophthalmology;  Laterality: Left;  CDE:18.73   CATARACT EXTRACTION W/PHACO Right 08/13/2019   Procedure: CATARACT EXTRACTION PHACO AND INTRAOCULAR LENS PLACEMENT (IOC);  Surgeon: Fabio Pierce, MD;  Location: [facility];  Service: Ophthalmology;  Laterality: Right;  CDE: 7.14   COLECTOMY     2005. Dr. Salvadore Dom for diverticulitis   COLONOSCOPY  02/22/09   normal/left-sided diverticula/multiple colonic polyp, adenomatous   COLONOSCOPY  12/16/2011   colonic polyps-treated as described above. Status postprior sigmoid resection. adenomatous. next TCS 12/2014   COLONOSCOPY N/A 12/28/2014   Status post prior segmental resection. Few residual  colonic diverticula- status post segmental biopsy negative random colon biopsies   ESOPHAGEAL DILATION N/A 12/28/2014   Procedure: ESOPHAGEAL DILATION;  Surgeon: Corbin Ade, MD;  Location: [facility];  Service: Endoscopy;  Laterality: N/A;   ESOPHAGOGASTRODUODENOSCOPY  02/22/09   normal s/p dilator;small HH/one large pedunculates polyp  s/p  clipping, hyperplastic   ESOPHAGOGASTRODUODENOSCOPY  12/16/2011   [name]: small hiatal hernia. Hyperplastic apperating polyps. Status post Elease Hashimoto dilation as described above.   ESOPHAGOGASTRODUODENOSCOPY N/A 12/28/2014   RMR: Normal-appearing esophagus status post passage of a maloney dilator. Hiatal hernia. abnormal gastic mucosa of uncertain significance status post gastric biopsy with mild chronic gastritis, no H pylori.    TONSILLECTOMY     TOTAL ABDOMINAL HYSTERECTOMY     Social History   Tobacco Use   Smoking status: Never   Smokeless tobacco: Never   Tobacco comments:    tobacco use - no  Vaping Use   Vaping Use: Never used  Substance Use Topics   Alcohol use: No    Alcohol/week: 0.0 standard drinks of alcohol   Drug use: No   Family History  Problem Relation Age of Onset   Hypertension Father    Diabetes Father    Heart attack Father    Heart attack Mother    Diabetes Mother    Hypertension Mother    Depression Son    Pancreatitis Son    Coronary artery disease Other        Family Hx    Colon cancer Maternal Grandfather    Heart disease Sister    Mental retardation Brother    Hernia Son    Allergies  Allergen Reactions   Adhesive [Tape] Itching    Very sensitive skin, tears easily   Demerol [Meperidine] Nausea And Vomiting   Iohexol Nausea And Vomiting    Patient states she almost died from severe n/v    Shellfish Allergy Diarrhea and Nausea And Vomiting   Penicillins Nausea And Vomiting and Rash   Review of Systems  Constitutional:  Positive for malaise/fatigue.  Gastrointestinal:  Positive for abdominal pain.  All other systems reviewed and are negative.     Objective:     BP 115/63   Pulse 86   Ht 5\' 3"  (1.6 m)   Wt 159 lb 12.8 oz (72.5 kg)   SpO2 96%   BMI 28.31 kg/m  BP Readings from Last 3 Encounters:  09/20/22 115/63  09/16/22 (!) 184/79  06/21/22 (!) 157/85   Physical Exam Vitals reviewed.  Constitutional:      General: She is not  in acute distress.    Appearance: Normal appearance. She is not toxic-appearing.     Comments: Examined in wheelchair   HENT:     Head: Normocephalic and atraumatic.     Right Ear: External ear normal.     Left Ear: External ear normal.     Nose: Nose normal. No congestion or rhinorrhea.     Mouth/Throat:     Mouth: Mucous membranes are moist.     Pharynx: Oropharynx is clear. No oropharyngeal exudate or posterior oropharyngeal erythema.  Eyes:     General: No scleral icterus.    Extraocular Movements: Extraocular movements intact.     Conjunctiva/sclera: Conjunctivae normal.     Pupils: Pupils are equal, round, and reactive to light.  Cardiovascular:     Rate and Rhythm: Normal rate. Rhythm irregular.     Pulses: Normal pulses.     Heart sounds: Normal heart sounds.  No murmur heard.    No friction rub. No gallop.  Pulmonary:     Effort: Pulmonary effort is normal.     Breath sounds: Normal breath sounds. No wheezing, rhonchi or rales.  Abdominal:     General: Abdomen is flat. Bowel sounds are normal. There is no distension.     Palpations: Abdomen is soft.     Tenderness: There is no abdominal tenderness.  Musculoskeletal:        General: No swelling. Normal range of motion.     Cervical back: Normal range of motion.     Right lower leg: No edema.     Left lower leg: No edema.  Lymphadenopathy:     Cervical: No cervical adenopathy.  Skin:    General: Skin is warm and dry.     Capillary Refill: Capillary refill takes less than 2 seconds.     Coloration: Skin is not jaundiced.  Neurological:     General: No focal deficit present.     Mental Status: She is alert and oriented to person, place, and time.  Psychiatric:        Mood and Affect: Mood normal.        Behavior: Behavior normal.   Last CBC Lab Results  Component Value Date   WBC 5.2 09/16/2022   HGB 11.4 (L) 09/16/2022   HCT 34.3 (L) 09/16/2022   MCV 94.2 09/16/2022   MCH 31.3 09/16/2022   RDW 14.4  09/16/2022   PLT 203 09/16/2022   Last metabolic panel Lab Results  Component Value Date   GLUCOSE 107 (H) 09/16/2022   NA 137 09/16/2022   K 3.5 09/16/2022   CL 102 09/16/2022   CO2 25 09/16/2022   BUN 19 09/16/2022   CREATININE 1.30 (H) 09/16/2022   GFRNONAA 40 (L) 09/16/2022   CALCIUM 8.8 (L) 09/16/2022   PHOS 3.5 02/25/2017   PROT 7.1 09/15/2022   ALBUMIN 3.7 09/15/2022   LABGLOB 3.2 06/21/2022   AGRATIO 1.2 06/21/2022   BILITOT 0.9 09/15/2022   ALKPHOS 65 09/15/2022   AST 21 09/15/2022   ALT 12 09/15/2022   ANIONGAP 10 09/16/2022   Last lipids Lab Results  Component Value Date   CHOL 154 05/22/2022   HDL 52 05/22/2022   LDLCALC 88 05/22/2022   TRIG 71 05/22/2022   CHOLHDL 3.0 05/22/2022   Last hemoglobin A1c Lab Results  Component Value Date   HGBA1C 6.8 (H) 09/15/2022   Last thyroid functions Lab Results  Component Value Date   TSH 2.790 05/22/2022   Last vitamin D Lab Results  Component Value Date   VD25OH 62 10/15/2019   Last vitamin B12 and Folate Lab Results  Component Value Date   VITAMINB12 181 06/21/2022   FOLATE 11.0 06/21/2022     Assessment & Plan:   Problem List Items Addressed This Visit       Diverticulitis of large intestine without perforation or abscess without bleeding    Uncomplicated diverticulitis.  Discharged on ciprofloxacin 500 mg twice daily with EOT 6/17.  She continues to endorse abdominal discomfort but partially attributes this to not having a bowel movement since hospital discharge. -Continue Cipro 500 mg twice daily.  EOT 6/17 -Recommend MiraLAX, stool softeners, and adequate fluid intake to help with bowel regularity.  Patient was instructed to notify our office if she does not have a bowel movement by Monday.      Acute metabolic encephalopathy    Recent hospital admission 6/9 - 6/10 in  the setting of altered mental status, ultimately attributed to acute metabolic encephalopathy in the setting of dehydration  and acute diverticulitis.  Fluid resuscitated.  Treated with antibiotics.  Mentation quickly improved.  She is alert and oriented x 3 today.  Her daughter feels as though her mental status is close to baseline.      Acute kidney injury superimposed on chronic kidney disease (HCC) - Primary    AKI on CKD noted during recent admission.  Prerenal etiology suspected in the setting of dehydration.  Treated with IV fluids.  Renal function trending to baseline prior to discharge. -Repeat BMP ordered today      Return in about 4 weeks (around 10/18/2022).   Billie Lade, MD

## 2022-09-20 NOTE — Assessment & Plan Note (Signed)
Uncomplicated diverticulitis.  Discharged on ciprofloxacin 500 mg twice daily with EOT 6/17.  She continues to endorse abdominal discomfort but partially attributes this to not having a bowel movement since hospital discharge. -Continue Cipro 500 mg twice daily.  EOT 6/17 -Recommend MiraLAX, stool softeners, and adequate fluid intake to help with bowel regularity.  Patient was instructed to notify our office if she does not have a bowel movement by Monday.

## 2022-09-20 NOTE — Assessment & Plan Note (Signed)
Recent hospital admission 6/9 - 6/10 in the setting of altered mental status, ultimately attributed to acute metabolic encephalopathy in the setting of dehydration and acute diverticulitis.  Fluid resuscitated.  Treated with antibiotics.  Mentation quickly improved.  She is alert and oriented x 3 today.  Her daughter feels as though her mental status is close to baseline.

## 2022-09-20 NOTE — Patient Instructions (Signed)
It was a pleasure to see you today.  Thank you for giving [place] the opportunity to be involved in your care.  Below is a brief recap of your visit and next steps.  We will plan to see you again in 4 weeks.  Summary No medication changes today Repeat chemistry panel I recommend drinking plenty of fluids, taking miralax and stool softeners to help with having a bowel movement Follow up with [facility] in 4 weeks

## 2022-09-21 LAB — BASIC METABOLIC PANEL
BUN/Creatinine Ratio: 17 (ref 12–28)
BUN: 22 mg/dL (ref 8–27)
CO2: 25 mmol/L (ref 20–29)
Calcium: 9 mg/dL (ref 8.7–10.3)
Chloride: 103 mmol/L (ref 96–106)
Creatinine, Ser: 1.32 mg/dL — ABNORMAL HIGH (ref 0.57–1.00)
Glucose: 243 mg/dL — ABNORMAL HIGH (ref 70–99)
Potassium: 4.9 mmol/L (ref 3.5–5.2)
Sodium: 141 mmol/L (ref 134–144)
eGFR: 40 mL/min/{1.73_m2} — ABNORMAL LOW (ref >59)

## 2022-09-24 ENCOUNTER — Telehealth: Payer: Self-pay | Admitting: Family Medicine

## 2022-09-24 NOTE — Telephone Encounter (Signed)
Patient daughter  called patient was seen 06.14.2024 and was given antibiotic and now has a yeast infection, patient is now scratching and asked can something be called into patient pharmacy for the yeast infection.

## 2022-09-25 ENCOUNTER — Telehealth: Payer: Self-pay

## 2022-09-25 ENCOUNTER — Telehealth: Payer: Self-pay | Admitting: Internal Medicine

## 2022-09-25 DIAGNOSIS — B3731 Acute candidiasis of vulva and vagina: Secondary | ICD-10-CM

## 2022-09-25 MED ORDER — FLUCONAZOLE 150 MG PO TABS
150.0000 mg | ORAL_TABLET | Freq: Once | ORAL | 0 refills | Status: AC
Start: 2022-09-25 — End: 2022-09-25

## 2022-09-25 MED ORDER — MONISTAT 1 COMBO PACK 1200 & 2 MG & % VA KIT
1.0000 | PACK | Freq: Once | VAGINAL | 0 refills | Status: AC
Start: 2022-09-25 — End: 2022-09-25

## 2022-09-25 NOTE — Telephone Encounter (Signed)
Spoke with patient son

## 2022-09-25 NOTE — Telephone Encounter (Signed)
Patient's daughter calling stating that her mother has a yeast infection from taking the antibiotics she got from the ED and wanting to know if there is anything you can send to [facility]

## 2022-09-25 NOTE — Telephone Encounter (Signed)
Rx sent to [name]. Recommend scheduling follow up if she continues to experience balance issues.

## 2022-09-25 NOTE — Telephone Encounter (Signed)
Message sent to dr Durwin Nora this morning in regards to patient

## 2022-09-26 ENCOUNTER — Other Ambulatory Visit: Payer: Self-pay

## 2022-09-26 ENCOUNTER — Emergency Department (HOSPITAL_COMMUNITY): Payer: Medicare Other

## 2022-09-26 ENCOUNTER — Inpatient Hospital Stay (HOSPITAL_COMMUNITY)
Admission: EM | Admit: 2022-09-26 | Discharge: 2022-10-01 | DRG: 640 | Disposition: A | Discharge status: Skilled Nursing Facility | Payer: Medicare Other | Attending: Internal Medicine | Admitting: Internal Medicine

## 2022-09-26 DIAGNOSIS — Z880 Allergy status to penicillin: Secondary | ICD-10-CM

## 2022-09-26 DIAGNOSIS — Z90710 Acquired absence of both cervix and uterus: Secondary | ICD-10-CM

## 2022-09-26 DIAGNOSIS — I4821 Permanent atrial fibrillation: Secondary | ICD-10-CM | POA: Diagnosis present

## 2022-09-26 DIAGNOSIS — E1122 Type 2 diabetes mellitus with diabetic chronic kidney disease: Secondary | ICD-10-CM | POA: Diagnosis present

## 2022-09-26 DIAGNOSIS — Z885 Allergy status to narcotic agent status: Secondary | ICD-10-CM

## 2022-09-26 DIAGNOSIS — N183 Chronic kidney disease, stage 3 unspecified: Secondary | ICD-10-CM | POA: Diagnosis present

## 2022-09-26 DIAGNOSIS — I482 Chronic atrial fibrillation, unspecified: Secondary | ICD-10-CM | POA: Insufficient documentation

## 2022-09-26 DIAGNOSIS — Z91013 Allergy to seafood: Secondary | ICD-10-CM

## 2022-09-26 DIAGNOSIS — Z7984 Long term (current) use of oral hypoglycemic drugs: Secondary | ICD-10-CM

## 2022-09-26 DIAGNOSIS — Z7901 Long term (current) use of anticoagulants: Secondary | ICD-10-CM

## 2022-09-26 DIAGNOSIS — Z8249 Family history of ischemic heart disease and other diseases of the circulatory system: Secondary | ICD-10-CM

## 2022-09-26 DIAGNOSIS — Z79899 Other long term (current) drug therapy: Secondary | ICD-10-CM

## 2022-09-26 DIAGNOSIS — G4733 Obstructive sleep apnea (adult) (pediatric): Secondary | ICD-10-CM | POA: Diagnosis present

## 2022-09-26 DIAGNOSIS — E860 Dehydration: Principal | ICD-10-CM | POA: Diagnosis present

## 2022-09-26 DIAGNOSIS — E538 Deficiency of other specified B group vitamins: Secondary | ICD-10-CM | POA: Diagnosis present

## 2022-09-26 DIAGNOSIS — Z91041 Radiographic dye allergy status: Secondary | ICD-10-CM

## 2022-09-26 DIAGNOSIS — E782 Mixed hyperlipidemia: Secondary | ICD-10-CM | POA: Diagnosis present

## 2022-09-26 DIAGNOSIS — M549 Dorsalgia, unspecified: Secondary | ICD-10-CM | POA: Diagnosis present

## 2022-09-26 DIAGNOSIS — Z961 Presence of intraocular lens: Secondary | ICD-10-CM | POA: Diagnosis present

## 2022-09-26 DIAGNOSIS — I129 Hypertensive chronic kidney disease with stage 1 through stage 4 chronic kidney disease, or unspecified chronic kidney disease: Secondary | ICD-10-CM | POA: Diagnosis present

## 2022-09-26 DIAGNOSIS — R4182 Altered mental status, unspecified: Secondary | ICD-10-CM | POA: Diagnosis present

## 2022-09-26 DIAGNOSIS — Z833 Family history of diabetes mellitus: Secondary | ICD-10-CM

## 2022-09-26 DIAGNOSIS — Z9049 Acquired absence of other specified parts of digestive tract: Secondary | ICD-10-CM

## 2022-09-26 DIAGNOSIS — G9341 Metabolic encephalopathy: Secondary | ICD-10-CM | POA: Diagnosis not present

## 2022-09-26 DIAGNOSIS — K219 Gastro-esophageal reflux disease without esophagitis: Secondary | ICD-10-CM | POA: Diagnosis present

## 2022-09-26 DIAGNOSIS — E11649 Type 2 diabetes mellitus with hypoglycemia without coma: Secondary | ICD-10-CM | POA: Diagnosis present

## 2022-09-26 DIAGNOSIS — Z91048 Other nonmedicinal substance allergy status: Secondary | ICD-10-CM

## 2022-09-26 DIAGNOSIS — K449 Diaphragmatic hernia without obstruction or gangrene: Secondary | ICD-10-CM | POA: Diagnosis present

## 2022-09-26 DIAGNOSIS — Z9079 Acquired absence of other genital organ(s): Secondary | ICD-10-CM

## 2022-09-26 DIAGNOSIS — F419 Anxiety disorder, unspecified: Secondary | ICD-10-CM | POA: Diagnosis present

## 2022-09-26 DIAGNOSIS — Z1152 Encounter for screening for COVID-19: Secondary | ICD-10-CM

## 2022-09-26 DIAGNOSIS — N1330 Unspecified hydronephrosis: Secondary | ICD-10-CM | POA: Diagnosis present

## 2022-09-26 DIAGNOSIS — G8929 Other chronic pain: Secondary | ICD-10-CM | POA: Diagnosis present

## 2022-09-26 DIAGNOSIS — Z794 Long term (current) use of insulin: Secondary | ICD-10-CM

## 2022-09-26 DIAGNOSIS — M797 Fibromyalgia: Secondary | ICD-10-CM | POA: Diagnosis present

## 2022-09-26 DIAGNOSIS — J45909 Unspecified asthma, uncomplicated: Secondary | ICD-10-CM | POA: Diagnosis present

## 2022-09-26 DIAGNOSIS — I10 Essential (primary) hypertension: Secondary | ICD-10-CM | POA: Diagnosis present

## 2022-09-26 LAB — CBC
HCT: 41 % (ref 36.0–46.0)
Hemoglobin: 13.4 g/dL (ref 12.0–15.0)
MCH: 31.8 pg (ref 26.0–34.0)
MCHC: 32.7 g/dL (ref 30.0–36.0)
MCV: 97.2 fL (ref 80.0–100.0)
Platelets: 242 10*3/uL (ref 150–400)
RBC: 4.22 MIL/uL (ref 3.87–5.11)
RDW: 15 % (ref 11.5–15.5)
WBC: 5.4 10*3/uL (ref 4.0–10.5)
nRBC: 0 % (ref 0.0–0.2)

## 2022-09-26 LAB — COMPREHENSIVE METABOLIC PANEL
ALT: 21 U/L (ref 0–44)
AST: 30 U/L (ref 15–41)
Albumin: 3.9 g/dL (ref 3.5–5.0)
Alkaline Phosphatase: 63 U/L (ref 38–126)
Anion gap: 10 (ref 5–15)
BUN: 18 mg/dL (ref 8–23)
CO2: 25 mmol/L (ref 22–32)
Calcium: 9.5 mg/dL (ref 8.9–10.3)
Chloride: 102 mmol/L (ref 98–111)
Creatinine, Ser: 1.11 mg/dL — ABNORMAL HIGH (ref 0.44–1.00)
GFR, Estimated: 48 mL/min — ABNORMAL LOW (ref >60)
Glucose, Bld: 155 mg/dL — ABNORMAL HIGH (ref 70–99)
Potassium: 4.2 mmol/L (ref 3.5–5.1)
Sodium: 137 mmol/L (ref 135–145)
Total Bilirubin: 0.7 mg/dL (ref 0.3–1.2)
Total Protein: 7.7 g/dL (ref 6.5–8.1)

## 2022-09-26 LAB — LACTIC ACID, PLASMA
Lactic Acid, Venous: 1.2 mmol/L (ref 0.5–1.9)
Lactic Acid, Venous: 1.3 mmol/L (ref 0.5–1.9)

## 2022-09-26 LAB — SARS CORONAVIRUS 2 BY RT PCR: SARS Coronavirus 2 by RT PCR: NEGATIVE

## 2022-09-26 LAB — CBG MONITORING, ED: Glucose-Capillary: 133 mg/dL — ABNORMAL HIGH (ref 70–99)

## 2022-09-26 LAB — URINALYSIS, ROUTINE W REFLEX MICROSCOPIC
Bilirubin Urine: NEGATIVE
Glucose, UA: NEGATIVE mg/dL
Hgb urine dipstick: NEGATIVE
Ketones, ur: NEGATIVE mg/dL
Nitrite: NEGATIVE
Protein, ur: 30 mg/dL — AB
Specific Gravity, Urine: 1.006 (ref 1.005–1.030)
pH: 7 (ref 5.0–8.0)

## 2022-09-26 LAB — TROPONIN I (HIGH SENSITIVITY): Troponin I (High Sensitivity): 10 ng/L (ref <18)

## 2022-09-26 LAB — PROCALCITONIN: Procalcitonin: <0.1 ng/mL

## 2022-09-26 LAB — TSH: TSH: 3.834 u[IU]/mL (ref 0.350–4.500)

## 2022-09-26 MED ORDER — ACETAMINOPHEN 325 MG PO TABS
650.0000 mg | ORAL_TABLET | Freq: Four times a day (QID) | ORAL | Status: DC | PRN
  Administered 2022-09-26 – 2022-09-28 (×3): 650 mg via ORAL
  Filled 2022-09-26 (×3): qty 2

## 2022-09-26 MED ORDER — ACETAMINOPHEN 650 MG RE SUPP: 650.0000 mg | Freq: Four times a day (QID) | RECTAL | Status: DC | PRN

## 2022-09-26 MED ORDER — SIMVASTATIN 20 MG PO TABS
20.0000 mg | ORAL_TABLET | Freq: Every day | ORAL | Status: DC
  Administered 2022-09-27 – 2022-10-01 (×5): 20 mg via ORAL
  Filled 2022-09-26 (×5): qty 1

## 2022-09-26 MED ORDER — PANTOPRAZOLE SODIUM 40 MG PO TBEC
40.0000 mg | DELAYED_RELEASE_TABLET | Freq: Two times a day (BID) | ORAL | Status: DC
  Administered 2022-09-26 – 2022-10-01 (×10): 40 mg via ORAL
  Filled 2022-09-26 (×10): qty 1

## 2022-09-26 MED ORDER — INSULIN ASPART 100 UNIT/ML IJ SOLN: 0.0000 [IU] | Freq: Three times a day (TID) | INTRAMUSCULAR | Status: DC

## 2022-09-26 MED ORDER — APIXABAN 2.5 MG PO TABS
2.5000 mg | ORAL_TABLET | Freq: Two times a day (BID) | ORAL | Status: DC
  Administered 2022-09-26 – 2022-10-01 (×10): 2.5 mg via ORAL
  Filled 2022-09-26 (×9): qty 1

## 2022-09-26 MED ORDER — SODIUM CHLORIDE 0.9 % IV BOLUS
1000.0000 mL | Freq: Once | INTRAVENOUS | Status: AC
  Administered 2022-09-26: 1000 mL via INTRAVENOUS

## 2022-09-26 MED ORDER — AMLODIPINE BESYLATE 5 MG PO TABS
5.0000 mg | ORAL_TABLET | Freq: Every day | ORAL | Status: DC
  Administered 2022-09-27 – 2022-10-01 (×5): 5 mg via ORAL
  Filled 2022-09-26 (×5): qty 1

## 2022-09-26 MED ORDER — ONDANSETRON HCL 4 MG/2ML IJ SOLN
4.0000 mg | Freq: Four times a day (QID) | INTRAMUSCULAR | Status: DC | PRN
  Administered 2022-09-26: 4 mg via INTRAVENOUS
  Filled 2022-09-26: qty 2

## 2022-09-26 MED ORDER — METRONIDAZOLE 500 MG/100ML IV SOLN
500.0000 mg | Freq: Once | INTRAVENOUS | Status: AC
  Administered 2022-09-26: 500 mg via INTRAVENOUS
  Filled 2022-09-26: qty 100

## 2022-09-26 MED ORDER — INSULIN DETEMIR 100 UNIT/ML ~~LOC~~ SOLN
10.0000 [IU] | Freq: Every day | SUBCUTANEOUS | Status: DC
  Filled 2022-09-26 (×2): qty 0.1

## 2022-09-26 MED ORDER — ONDANSETRON HCL 4 MG PO TABS: 4.0000 mg | ORAL_TABLET | Freq: Four times a day (QID) | ORAL | Status: DC | PRN

## 2022-09-26 MED ORDER — LOSARTAN POTASSIUM 50 MG PO TABS
50.0000 mg | ORAL_TABLET | Freq: Every day | ORAL | Status: DC
  Administered 2022-09-27 – 2022-10-01 (×5): 50 mg via ORAL
  Filled 2022-09-26 (×5): qty 1

## 2022-09-26 MED ORDER — FLUCONAZOLE 150 MG PO TABS
150.0000 mg | ORAL_TABLET | Freq: Every day | ORAL | Status: AC
  Administered 2022-09-27 – 2022-09-29 (×3): 150 mg via ORAL
  Filled 2022-09-26 (×3): qty 1

## 2022-09-26 MED ORDER — OXYCODONE HCL 5 MG PO TABS: 5.0000 mg | ORAL_TABLET | ORAL | Status: DC | PRN

## 2022-09-26 MED ORDER — SODIUM CHLORIDE 0.9 % IV SOLN
2.0000 g | Freq: Once | INTRAVENOUS | Status: AC
  Administered 2022-09-26: 2 g via INTRAVENOUS
  Filled 2022-09-26: qty 20

## 2022-09-26 MED ORDER — METOPROLOL TARTRATE 25 MG PO TABS
12.5000 mg | ORAL_TABLET | Freq: Two times a day (BID) | ORAL | Status: DC
  Administered 2022-09-26 – 2022-09-28 (×4): 12.5 mg via ORAL
  Filled 2022-09-26 (×4): qty 1

## 2022-09-26 MED ORDER — INSULIN ASPART 100 UNIT/ML IJ SOLN: 0.0000 [IU] | Freq: Every day | INTRAMUSCULAR | Status: DC

## 2022-09-26 NOTE — ED Notes (Signed)
Daughter called for an update and stated she wasn't coming at the moment and asked to be called when we know something.

## 2022-09-26 NOTE — ED Triage Notes (Signed)
Pt arrived [facility] transport . Per Ems family states pt has increased confusion, increased weakness, increased sleepiness,sweaty, unable to walk, multiple incontinent diarrhea, yeast infection d/t ABT and cough & expels a large amount of saliva every few minutes.  Pt at bedside unable to answer questions correctly.

## 2022-09-26 NOTE — ED Provider Notes (Signed)
[place] EMERGENCY DEPARTMENT AT [facility] Provider Note   CSN: [id number] Arrival date & time: 09/26/22  1406     History  Chief Complaint  Patient presents with   Altered Mental Status    [name] is a 88 y.o. female.  He has history of A-fib on Eliquis, diabetes, GERD.  She presents the ER today via EMS transport.  Family requested transport due to increased confusion and generalized weakness, diarrhea and being off balance since yesterday.  Patient was admitted on June 9 for diverticulitis and metabolic encephalopathy.  She was discharged home on antibiotics and was improving, was almost back to her baseline when she saw her PCP on June 14.  Patient unable to answer questions so I spoke with her daughter on the phone to obtain history beyond the EMS report.  She states that her mother started having generalized weakness and being off balance yesterday, almost fell down in the bathroom yesterday but did not fall, no head injury or loss of consciousness.  The caregiver who has been sitting with her the past couple days called and reported that patient had been coughing up sputum frequently and not answering questions appropriately today.  Since this is worsened since yesterday, family decided to call for transport to the ED.  They have not noticed any fevers.   Altered Mental Status      Home Medications Prior to Admission medications   Medication Sig Start Date End Date Taking? Authorizing Provider  albuterol (VENTOLIN HFA) 108 (90 Base) MCG/ACT inhaler Inhale 2 puffs into the lungs every 6 (six) hours as needed for wheezing or shortness of breath. 05/22/22   Del Nigel Berthold, FNP  amLODipine (NORVASC) 5 MG tablet Take 5 mg by mouth daily.    [provider]  apixaban (ELIQUIS) 2.5 MG TABS tablet TAKE 1 TABLET BY MOUTH TWICE DAILY Patient taking differently: Take 2.5 mg by mouth 2 (two) times daily. 06/20/22   Jonelle Sidle, MD  Continuous  Blood Gluc Receiver (FREESTYLE LIBRE 2 READER) DEVI As directed 01/22/22   Roma Kayser, MD  Continuous Blood Gluc Sensor (FREESTYLE LIBRE 2 SENSOR) MISC 1 Piece by Does not apply route every 14 (fourteen) days. 11/22/21   Roma Kayser, MD  cyanocobalamin (VITAMIN B12) 1000 MCG tablet Take 1 tablet (1,000 mcg total) by mouth daily. 09/16/22 12/15/22  Vassie Loll, MD  glipiZIDE (GLUCOTROL XL) 5 MG 24 hr tablet Take 1 tablet (5 mg total) by mouth daily with breakfast. 05/27/22   Nida, Denman George, MD  GLOBAL EASE INJECT PEN NEEDLES 31G X 5 MM MISC USE ONE TIP WITH INSULIN AT BEDTIME 07/10/20   Nida, Denman George, MD  insulin glargine (LANTUS SOLOSTAR) 100 UNIT/ML Solostar Pen Inject 16 Units into the skin at bedtime. Patient taking differently: Inject 20 Units into the skin at bedtime. 05/27/22   Roma Kayser, MD  losartan (COZAAR) 50 MG tablet Take 1 tablet (50 mg total) by mouth daily. 09/17/22   Vassie Loll, MD  metoprolol tartrate (LOPRESSOR) 25 MG tablet TAKE 1/2 TABLET BY MOUTH TWICE DAILY 06/24/22   Jonelle Sidle, MD  nitroGLYCERIN (NITROSTAT) 0.4 MG SL tablet PLACE 1 TABLET UNDER THE TONGUE EVERY 5 MINUTES FOR 3 DOSES AS NEEDED FOR CHEST PAIN. IF YOU HAVE NO RELIEF AFTER THE 3RD DOSE PROCEED TO THE ED FOR AN EVALUATION 06/15/19   Jonelle Sidle, MD  ondansetron (ZOFRAN-ODT) 8 MG disintegrating tablet Take 1 tablet (8  mg total) by mouth every 8 (eight) hours as needed for nausea or vomiting. 09/16/22   Vassie Loll, MD  pantoprazole (PROTONIX) 40 MG tablet TAKE 1 TABLET BY MOUTH TWICE DAILY Patient taking differently: Take 40 mg by mouth 2 (two) times daily. 08/21/22   [name], Gerrit Friends, MD  simvastatin (ZOCOR) 20 MG tablet Take 20 mg by mouth daily.  08/03/13   [provider]  triamcinolone cream (KENALOG) 0.1 % Apply 1 Application topically 2 (two) times daily as needed (irritation). 09/16/22   Vassie Loll, MD      Allergies    Adhesive [tape],  Demerol [meperidine], Iohexol, Shellfish allergy, and Penicillins    Review of Systems   Review of Systems  Physical Exam Updated Vital Signs BP 107/77   Pulse 84   Temp 98.1 F (36.7 C) (Oral)   Resp 18   Ht 5\' 3"  (1.6 m)   Wt 72.5 kg   SpO2 94%   BMI 28.31 kg/m  Physical Exam Vitals and nursing note reviewed.  Constitutional:      General: She is not in acute distress.    Appearance: She is well-developed.     Comments: Somewhat drowsy, patient keeps her eyes closed even when answering questions  HENT:     Head: Normocephalic and atraumatic.     Mouth/Throat:     Mouth: Mucous membranes are moist.  Eyes:     Conjunctiva/sclera: Conjunctivae normal.     Pupils: Pupils are equal, round, and reactive to light.  Cardiovascular:     Rate and Rhythm: Normal rate and regular rhythm.     Heart sounds: No murmur heard. Pulmonary:     Effort: Pulmonary effort is normal. No respiratory distress.     Breath sounds: Normal breath sounds.  Abdominal:     Palpations: Abdomen is soft.     Comments: Patient reports tenderness diffusely through her abdomen though abdomen is soft no guarding rebound or rigidity  Musculoskeletal:        General: No swelling.     Cervical back: Neck supple.  Skin:    General: Skin is warm and dry.     Capillary Refill: Capillary refill takes less than 2 seconds.  Neurological:     General: No focal deficit present.     Mental Status: She is disoriented.     Comments: Patient follows commands, was able to squeeze my hand and lift her legs and arms off the bed, no focal weakness but strength seems diminished throughout.  No facial asymmetry noted  Psychiatric:        Mood and Affect: Mood normal.     ED Results / Procedures / Treatments   Labs (all labs ordered are listed, but only abnormal results are displayed) Labs Reviewed  COMPREHENSIVE METABOLIC PANEL - Abnormal; Notable for the following components:      Result Value   Glucose, Bld 155  (*)    Creatinine, Ser 1.11 (*)    GFR, Estimated 48 (*)    All other components within normal limits  URINALYSIS, ROUTINE W REFLEX MICROSCOPIC - Abnormal; Notable for the following components:   Color, Urine STRAW (*)    Protein, ur 30 (*)    Leukocytes,Ua SMALL (*)    Bacteria, UA RARE (*)    All other components within normal limits  CBG MONITORING, ED - Abnormal; Notable for the following components:   Glucose-Capillary 133 (*)    All other components within normal limits  SARS CORONAVIRUS  2 BY RT PCR  CULTURE, BLOOD (ROUTINE X 2)  CULTURE, BLOOD (ROUTINE X 2)  URINE CULTURE  CBC  LACTIC ACID, PLASMA  LACTIC ACID, PLASMA  PROCALCITONIN  TSH  TROPONIN I (HIGH SENSITIVITY)    EKG None  Radiology CT CHEST ABDOMEN PELVIS WO CONTRAST  Result Date: 09/26/2022 CLINICAL DATA:  Abdominal pain. EXAM: CT CHEST, ABDOMEN AND PELVIS WITHOUT CONTRAST TECHNIQUE: Multidetector CT imaging of the chest, abdomen and pelvis was performed following the standard protocol without IV contrast. RADIATION DOSE REDUCTION: This exam was performed according to the departmental dose-optimization program which includes automated exposure control, adjustment of the mA and/or kV according to patient size and/or use of iterative reconstruction technique. COMPARISON:  CT abdomen pelvis dated 09/15/2022. FINDINGS: Evaluation of this exam is limited in the absence of intravenous contrast as well as streak artifact caused by patient's arms. CT CHEST FINDINGS Cardiovascular: Top-normal cardiac size. No pericardial effusion. Three-vessel coronary vascular calcification. Moderate atherosclerotic calcification of the thoracic aorta. No aneurysmal dilatation. The central pulmonary arteries are grossly unremarkable on this noncontrast CT. Mediastinum/Nodes: No hilar or mediastinal adenopathy. The esophagus is grossly unremarkable. No mediastinal fluid collection. Lungs/Pleura: Small left pleural effusion. No focal  consolidation or pneumothorax. The central airways are patent. Musculoskeletal: Osteopenia with degenerative changes of the spine. No acute osseous pathology. CT ABDOMEN PELVIS FINDINGS No intra-abdominal free air or free fluid. Hepatobiliary: The liver is unremarkable. No biliary dilatation. The gallbladder is unremarkable. Pancreas: Moderate pancreatic atrophy. No active inflammatory changes. No dilatation of the main pancreatic duct. Spleen: Normal in size without focal abnormality. Adrenals/Urinary Tract: The adrenal glands are unremarkable. Severe right renal parenchyma atrophy with chronic severe right hydronephrosis with transition at the level of the ureteropelvic junction. Punctate focus of parenchymal calcification versus a nonobstructing stone in the interpolar right kidney. There is no hydronephrosis or nephrolithiasis on the left. Subcentimeter faint left renal inferior pole hypodense lesion is not characterized on this noncontrast CT. The urinary bladder is minimally distended and grossly unremarkable. Stomach/Bowel: Interval resolution of the previously seen sigmoid diverticulitis. No collection or abscess. There is sigmoid diverticulosis without active inflammatory changes. There is no bowel obstruction or active inflammation. There is a moderate size anterior pelvic pannus containing loops of small bowel. The appendix is not visualized with certainty. No inflammatory changes identified in the right lower quadrant. Vascular/Lymphatic: Advanced aortoiliac atherosclerotic disease. The IVC is unremarkable. No portal gas. There is no adenopathy. Reproductive: Hysterectomy.  No adnexal masses. Other: None Musculoskeletal: Osteopenia with scoliosis and degenerative changes. Grade 2 L5-S1 anterolisthesis. No acute osseous pathology. IMPRESSION: 1. Small left pleural effusion. No focal consolidation. 2. Interval resolution of the previously seen sigmoid diverticulitis. 3. No bowel obstruction. 4. Chronic  severe right hydronephrosis and right renal parenchyma atrophy. 5.  Aortic Atherosclerosis (ICD10-I70.0). Electronically Signed   By: Elgie Collard M.D.   On: 09/26/2022 19:30   CT HEAD WO CONTRAST  Result Date: 09/26/2022 CLINICAL DATA:  Increased confusion, weakness, sleepiness EXAM: CT HEAD WITHOUT CONTRAST TECHNIQUE: Contiguous axial images were obtained from the base of the skull through the vertex without intravenous contrast. RADIATION DOSE REDUCTION: This exam was performed according to the departmental dose-optimization program which includes automated exposure control, adjustment of the mA and/or kV according to patient size and/or use of iterative reconstruction technique. COMPARISON:  09/15/2022 FINDINGS: Brain: No evidence of acute infarction, hemorrhage, mass, mass effect, or midline shift. No hydrocephalus or extra-axial fluid collection. Vascular: No hyperdense vessel. Skull: Negative for fracture  or focal lesion. Hyperostosis frontotemporalis. Prior right craniotomy. Sinuses/Orbits: Mucosal thickening in the right maxillary sinus and sphenoid sinuses. Status post bilateral lens replacements. Other: The mastoid air cells are well aerated. IMPRESSION: No acute intracranial process. Electronically Signed   By: Wiliam Ke M.D.   On: 09/26/2022 16:48   DG Chest Port 1 View  Result Date: 09/26/2022 CLINICAL DATA:  Cough. EXAM: PORTABLE CHEST 1 VIEW COMPARISON:  09/15/2022. FINDINGS: Low lung volumes accentuate the pulmonary vasculature and cardiomediastinal silhouette. Stable mild cardiomegaly. No consolidation or pulmonary edema. No pleural effusion or pneumothorax. IMPRESSION: No evidence of acute cardiopulmonary disease. Electronically Signed   By: Orvan Falconer M.D.   On: 09/26/2022 15:21    Procedures Procedures    Medications Ordered in ED Medications  cefTRIAXone (ROCEPHIN) 2 g in sodium chloride 0.9 % 100 mL IVPB (0 g Intravenous Stopped 09/26/22 2109)    And   metroNIDAZOLE (FLAGYL) IVPB 500 mg (has no administration in time range)  sodium chloride 0.9 % bolus 1,000 mL (1,000 mLs Intravenous New Bag/Given 09/26/22 2051)    ED Course/ Medical Decision Making/ A&P                             Medical Decision Making This patient presents to the ED for concern of altered mental status, this involves an extensive number of treatment options, and is a complaint that carries with it a high risk of complications and morbidity.  The differential diagnosis includes sepsis, intracranial hemorrhage, CVA, metabolic encephalopathy, delirium, other   Co morbidities that complicate the patient evaluation :   Recent diverticulitis, A-fib on anticoagulation   Additional history obtained:  Additional history obtained from EMR External records from outside source obtained and reviewed including recent PCP notes and discharge summary   Lab Tests:  I Ordered, and personally interpreted labs.  The pertinent results include: Normal lactic acid, negative COVID, urinalysis has 6-10 white blood cells, rare bacteria, small leukocytes CMP shows baseline renal function   Imaging Studies ordered:  I ordered imaging studies including chest x-ray, CT head, CT chest abdomen pelvis I independently visualized and interpreted imaging which showed chest x-ray shows no pulmonary edema or infiltrate, CT head shows no intracranial hemorrhage or other acute process, CT chest abdomen pelvis shows no acute intrathoracic or intra-abdominal infections or other acute process I agree with the radiologist interpretation   Cardiac Monitoring: / EKG:  The patient was maintained on a cardiac monitor.  I personally viewed and interpreted the cardiac monitored which showed an underlying rhythm of: atrial fibrillation   Consultations Obtained:  I requested consultation with the hospitalist,  and discussed lab and imaging findings as well as pertinent plan - they they are agreeable  with admitting for antibiotics and waiting on cultures for possible infection.  Patient symptoms returned as soon as she stopped antibiotics   Problem List / ED Course / Critical interventions / Medication management  Presents with altered mental status, had similar presentation a couple of weeks ago, improved with antibiotics and returned symptoms as soon as she stopped them.  No focal deficits.  Labs and imaging are all reassuring, concern for possible UTI or unresolved diverticulitis that is not evident on CT.  I have reviewed the patients home medicines and have made adjustments as needed   Social Determinants of Health:  Lives with her sons, her daughter lives in [place] and will be coming up tomorrow  Amount and/or Complexity of Data Reviewed Labs: ordered. Radiology: ordered. ECG/medicine tests: ordered.  Risk Prescription drug management. Decision regarding hospitalization.           Final Clinical Impression(s) / ED Diagnoses Final diagnoses:  Metabolic encephalopathy    Rx / DC Orders ED Discharge Orders     None         Josem Kaufmann 09/26/22 2128    Bethann Berkshire, MD 09/27/22 1111

## 2022-09-26 NOTE — ED Notes (Signed)
Yeast noted during collection of urine. PA made aware .

## 2022-09-27 DIAGNOSIS — E1122 Type 2 diabetes mellitus with diabetic chronic kidney disease: Secondary | ICD-10-CM

## 2022-09-27 DIAGNOSIS — Z1152 Encounter for screening for COVID-19: Secondary | ICD-10-CM | POA: Diagnosis not present

## 2022-09-27 DIAGNOSIS — G9341 Metabolic encephalopathy: Secondary | ICD-10-CM | POA: Diagnosis present

## 2022-09-27 DIAGNOSIS — E782 Mixed hyperlipidemia: Secondary | ICD-10-CM | POA: Diagnosis present

## 2022-09-27 DIAGNOSIS — Z7901 Long term (current) use of anticoagulants: Secondary | ICD-10-CM | POA: Diagnosis not present

## 2022-09-27 DIAGNOSIS — I4821 Permanent atrial fibrillation: Secondary | ICD-10-CM | POA: Diagnosis present

## 2022-09-27 DIAGNOSIS — K219 Gastro-esophageal reflux disease without esophagitis: Secondary | ICD-10-CM | POA: Diagnosis present

## 2022-09-27 DIAGNOSIS — I482 Chronic atrial fibrillation, unspecified: Secondary | ICD-10-CM | POA: Diagnosis not present

## 2022-09-27 DIAGNOSIS — M797 Fibromyalgia: Secondary | ICD-10-CM | POA: Diagnosis present

## 2022-09-27 DIAGNOSIS — E860 Dehydration: Secondary | ICD-10-CM | POA: Diagnosis present

## 2022-09-27 DIAGNOSIS — E538 Deficiency of other specified B group vitamins: Secondary | ICD-10-CM | POA: Diagnosis present

## 2022-09-27 DIAGNOSIS — E11649 Type 2 diabetes mellitus with hypoglycemia without coma: Secondary | ICD-10-CM | POA: Diagnosis present

## 2022-09-27 DIAGNOSIS — N183 Chronic kidney disease, stage 3 unspecified: Secondary | ICD-10-CM

## 2022-09-27 DIAGNOSIS — N1330 Unspecified hydronephrosis: Secondary | ICD-10-CM | POA: Diagnosis present

## 2022-09-27 DIAGNOSIS — G8929 Other chronic pain: Secondary | ICD-10-CM | POA: Diagnosis present

## 2022-09-27 DIAGNOSIS — Z833 Family history of diabetes mellitus: Secondary | ICD-10-CM | POA: Diagnosis not present

## 2022-09-27 DIAGNOSIS — Z7984 Long term (current) use of oral hypoglycemic drugs: Secondary | ICD-10-CM | POA: Diagnosis not present

## 2022-09-27 DIAGNOSIS — Z794 Long term (current) use of insulin: Secondary | ICD-10-CM | POA: Diagnosis not present

## 2022-09-27 DIAGNOSIS — G4733 Obstructive sleep apnea (adult) (pediatric): Secondary | ICD-10-CM | POA: Diagnosis present

## 2022-09-27 DIAGNOSIS — K449 Diaphragmatic hernia without obstruction or gangrene: Secondary | ICD-10-CM | POA: Diagnosis present

## 2022-09-27 DIAGNOSIS — I10 Essential (primary) hypertension: Secondary | ICD-10-CM | POA: Diagnosis not present

## 2022-09-27 DIAGNOSIS — Z8249 Family history of ischemic heart disease and other diseases of the circulatory system: Secondary | ICD-10-CM | POA: Diagnosis not present

## 2022-09-27 DIAGNOSIS — I129 Hypertensive chronic kidney disease with stage 1 through stage 4 chronic kidney disease, or unspecified chronic kidney disease: Secondary | ICD-10-CM | POA: Diagnosis present

## 2022-09-27 DIAGNOSIS — M549 Dorsalgia, unspecified: Secondary | ICD-10-CM | POA: Diagnosis present

## 2022-09-27 DIAGNOSIS — F419 Anxiety disorder, unspecified: Secondary | ICD-10-CM | POA: Diagnosis present

## 2022-09-27 DIAGNOSIS — R4182 Altered mental status, unspecified: Secondary | ICD-10-CM | POA: Diagnosis present

## 2022-09-27 DIAGNOSIS — J45909 Unspecified asthma, uncomplicated: Secondary | ICD-10-CM | POA: Diagnosis present

## 2022-09-27 LAB — CULTURE, BLOOD (ROUTINE X 2)

## 2022-09-27 LAB — CBC WITH DIFFERENTIAL/PLATELET
Abs Immature Granulocytes: 0.03 10*3/uL (ref 0.00–0.07)
Basophils Absolute: 0 10*3/uL (ref 0.0–0.1)
Basophils Relative: 1 %
Eosinophils Absolute: 0.1 10*3/uL (ref 0.0–0.5)
Eosinophils Relative: 3 %
HCT: 31 % — ABNORMAL LOW (ref 36.0–46.0)
Hemoglobin: 10.1 g/dL — ABNORMAL LOW (ref 12.0–15.0)
Immature Granulocytes: 1 %
Lymphocytes Relative: 23 %
Lymphs Abs: 1.1 10*3/uL (ref 0.7–4.0)
MCH: 31.9 pg (ref 26.0–34.0)
MCHC: 32.6 g/dL (ref 30.0–36.0)
MCV: 97.8 fL (ref 80.0–100.0)
Monocytes Absolute: 0.4 10*3/uL (ref 0.1–1.0)
Monocytes Relative: 7 %
Neutro Abs: 3.2 10*3/uL (ref 1.7–7.7)
Neutrophils Relative %: 65 %
Platelets: 206 10*3/uL (ref 150–400)
RBC: 3.17 MIL/uL — ABNORMAL LOW (ref 3.87–5.11)
RDW: 14.8 % (ref 11.5–15.5)
WBC: 4.9 10*3/uL (ref 4.0–10.5)
nRBC: 0 % (ref 0.0–0.2)

## 2022-09-27 LAB — COMPREHENSIVE METABOLIC PANEL
ALT: 14 U/L (ref 0–44)
AST: 17 U/L (ref 15–41)
Albumin: 2.7 g/dL — ABNORMAL LOW (ref 3.5–5.0)
Alkaline Phosphatase: 45 U/L (ref 38–126)
Anion gap: 6 (ref 5–15)
BUN: 17 mg/dL (ref 8–23)
CO2: 25 mmol/L (ref 22–32)
Calcium: 8.4 mg/dL — ABNORMAL LOW (ref 8.9–10.3)
Chloride: 107 mmol/L (ref 98–111)
Creatinine, Ser: 1.1 mg/dL — ABNORMAL HIGH (ref 0.44–1.00)
GFR, Estimated: 49 mL/min — ABNORMAL LOW (ref >60)
Glucose, Bld: 64 mg/dL — ABNORMAL LOW (ref 70–99)
Potassium: 3.9 mmol/L (ref 3.5–5.1)
Sodium: 138 mmol/L (ref 135–145)
Total Bilirubin: 0.5 mg/dL (ref 0.3–1.2)
Total Protein: 5.4 g/dL — ABNORMAL LOW (ref 6.5–8.1)

## 2022-09-27 LAB — GLUCOSE, CAPILLARY
Glucose-Capillary: 62 mg/dL — ABNORMAL LOW (ref 70–99)
Glucose-Capillary: 102 mg/dL — ABNORMAL HIGH (ref 70–99)
Glucose-Capillary: 97 mg/dL (ref 70–99)
Glucose-Capillary: 214 mg/dL — ABNORMAL HIGH (ref 70–99)
Glucose-Capillary: 160 mg/dL — ABNORMAL HIGH (ref 70–99)

## 2022-09-27 LAB — MAGNESIUM: Magnesium: 1.6 mg/dL — ABNORMAL LOW (ref 1.7–2.4)

## 2022-09-27 MED ORDER — NYSTATIN 100000 UNIT/GM EX POWD
Freq: Two times a day (BID) | CUTANEOUS | Status: DC
  Filled 2022-09-27 (×5): qty 15

## 2022-09-27 MED ORDER — MAGNESIUM SULFATE 2 GM/50ML IV SOLN
2.0000 g | Freq: Once | INTRAVENOUS | Status: AC
  Administered 2022-09-27: 2 g via INTRAVENOUS
  Filled 2022-09-27: qty 50

## 2022-09-27 MED ORDER — INSULIN DETEMIR 100 UNIT/ML ~~LOC~~ SOLN
7.0000 [IU] | Freq: Every day | SUBCUTANEOUS | Status: DC
  Filled 2022-09-27: qty 0.07

## 2022-09-27 MED ORDER — CYANOCOBALAMIN 1000 MCG/ML IJ SOLN
1000.0000 ug | Freq: Every day | INTRAMUSCULAR | Status: DC
  Administered 2022-09-27 – 2022-10-01 (×5): 1000 ug via INTRAMUSCULAR
  Filled 2022-09-27 (×5): qty 1

## 2022-09-27 MED ORDER — SODIUM CHLORIDE 0.9 % IV SOLN: INTRAVENOUS | Status: AC

## 2022-09-27 MED ORDER — CYANOCOBALAMIN 1000 MCG/ML IJ SOLN: 1000.0000 ug | Freq: Once | INTRAMUSCULAR | Status: DC

## 2022-09-27 MED ORDER — INSULIN ASPART 100 UNIT/ML IJ SOLN
0.0000 [IU] | Freq: Three times a day (TID) | INTRAMUSCULAR | Status: DC
  Administered 2022-09-27: 2 [IU] via SUBCUTANEOUS
  Administered 2022-09-28 – 2022-09-29 (×2): 1 [IU] via SUBCUTANEOUS
  Administered 2022-09-30: 2 [IU] via SUBCUTANEOUS
  Administered 2022-09-30 – 2022-10-01 (×2): 1 [IU] via SUBCUTANEOUS

## 2022-09-27 MED ORDER — OXYCODONE HCL 5 MG PO TABS: 5.0000 mg | ORAL_TABLET | Freq: Four times a day (QID) | ORAL | Status: DC | PRN

## 2022-09-27 NOTE — Assessment & Plan Note (Signed)
-  continue statin -Heart healthy diet discussed with patient and family.

## 2022-09-27 NOTE — Progress Notes (Signed)
Patient seen and examined; admitted secondary to altered mental status changes.  Workup unremarkable so far complete chart review very concerned for B12 deficiency.  Hemodynamically stable at the moment.  Some decreased oral intake and mild hypoglycemia appreciated.  Continue fluid resuscitation and supportive care.  Please refer to H&P written by Dr. Carren Rang for further info/details on admission.  Plan: -B12 supplementation IM -continue IVF's and close CBG monitoring. -no abnormalities on CT head and no signs of acute infection appreciated. -will minimize sedative agents. -checking thiamine levels as well. -will follow electrolytes and replete them as needed.  Vassie Loll MD [phone]

## 2022-09-27 NOTE — Assessment & Plan Note (Signed)
Continue protonix  

## 2022-09-27 NOTE — Assessment & Plan Note (Signed)
-  Continue metoprolol and eliquis -Rate control -Continue telemetry monitoring -Goal is for potassium above 4 and mag is above 2 as much as possible.

## 2022-09-27 NOTE — Progress Notes (Addendum)
Patient continues to be alert to self. Patient has had no complaints during this shift.

## 2022-09-27 NOTE — Plan of Care (Signed)

## 2022-09-27 NOTE — Assessment & Plan Note (Signed)
-  on basal insulin 20 units at baseline -Continue 10 units basal insulin -Sliding scale coverage -Continue to monitor

## 2022-09-27 NOTE — Assessment & Plan Note (Signed)
-  Excessively fatigued, deconditioned and disoriented at time of admission. -So far workup negative except for mild dehydration and B12 deficiency -Continue to maintain adequate fluid resuscitation, electrolyte repletion and B12 repletion/supplementation -Physical therapy has worked with patient and is recommending skilled nursing facility placement for rehabilitation and conditioning. -Mentation is better and patient more interactive on today's examination; per family member still not back to baseline. -Continue minimizing any sedative agents.

## 2022-09-27 NOTE — Inpatient Diabetes Management (Signed)
Inpatient Diabetes Program Recommendations  AACE/ADA: New Consensus Statement on Inpatient Glycemic Control (2015)  Target Ranges:  Prepandial:   less than 140 mg/dL      Peak postprandial:   less than 180 mg/dL (1-2 hours)      Critically ill patients:  140 - 180 mg/dL   Lab Results  Component Value Date   GLUCAP 97 09/27/2022   HGBA1C 6.8 (H) 09/15/2022    Review of Glycemic Control  Latest Reference Range & Units 09/27/22 07:13 09/27/22 07:42 09/27/22 11:17  Glucose-Capillary 70 - 99 mg/dL 62 (L) 409 (H) 97  (L): Data is abnormally low (H): Data is abnormally high Diabetes history: Type 2 DM Outpatient Diabetes medications: Lantus 20 units at bedtime, Glipizide 5 mg QD Current orders for Inpatient glycemic control: Levemir 7 units at bedtime, Novolog 0-15 units TID & HS  Inpatient Diabetes Program Recommendations:    Given current trends and decreased oral intake, consider decreasing correction to Novolog 0-6 units Q4H and discontinuing Levemir.  Secure chat sent to Md.   Thanks, Lujean Rave, MSN, RNC-OB Diabetes Coordinator [phone] (8a-5p)

## 2022-09-27 NOTE — H&P (Signed)
History and Physical    Patient: [name] DOB: 09/01/1934 DOA: 09/26/2022 DOS: the patient was seen and examined on 09/27/2022 PCP: Rica Records, FNP  Patient coming from: Home  Chief Complaint:  Chief Complaint  Patient presents with   Altered Mental Status   HPI: [name] is a 88 y.o. female with medical history significant of anxiety, asthma, GERD, hypertension, fibromyalgia, hyperlipidemia, type 2 diabetes mellitus, and more presents the ED with a chief complaint of altered mental status and weakness.  Patient was recently discharged on 10 June.  At that time she had been admitted for altered mental status described as confusion and fatigue with generalized weakness.  She was diagnosed with diverticulitis.  She was given antibiotics in the hospital and discharged on p.o. antibiotics.  Family had reported to the ED staff that patient was improved, but 2 days after she finished her antibiotics she became worse again.  There is a note from her hospital follow-up visit on the 14th the reports patient was discharged on Cipro.  She reported feeling fairly well and increased p.o. intake from prior to her hospitalization.  They had no acute concerns to discuss at that appointment.  Today she had a CT of her chest abdomen pelvis that showed resolution of her sigmoid diverticulitis, chronic right hydronephrosis.  Unfortunately, patient is too fatigued to answer my questions.  It is reported that she was not answering questions appropriately at home, but she can even stay awake long enough to answer my questions.  She is not following commands.  She is protecting her airway.  She does moan in pain and opens her eyes briefly to verbal stimuli.  No ACP documents. Review of Systems: unable to review all systems due to the inability of the patient to answer questions. Past Medical History:  Diagnosis Date   Anxiety    Asthma    Chronic back pain    Colonoscopy  causing post-procedural bleeding    Incomplete. by Dr. Jena Gauss 06/11/99 due to fixed non-compliant colon precluded exam to 40 cm, she had subsequent colectomy for diverticulitis since that time.    Diarrhea    Esophageal reflux    Essential hypertension    Fibromyalgia    Hiatal hernia    Recent EGD/TCS showed small hiatal hernia, normal apperaring tubular esophagus. s/p passage of a 54-french Maloney dilator, s/p biopsy esophageal mucosa, multiple fundal gland type gastric polyps. one large pedunculated polp with oozing, noted s/p clipping and snare polypectomy. Biopsies of the gastric mucosa taken given her symptoms in her elevated count. normal D1-D3, s/p biospy D2-D3.    Hiatal hernia    all bx unremarkable. on TCS she had left-sided diverticula, evidence of prior segmental resection with anastomosis, multiple colonic polyps s/p multiple snare polypectomies, s/p segmental biopsy and stool sampling. she had multiple tubular adenomas. no microscopic/collagenous colitis. stool culture, c diff, O+P, lactoferrin were negative.    Mixed hyperlipidemia    Nephrolithiasis    OSA (obstructive sleep apnea)    Permanent atrial fibrillation (HCC)    Previous failed DCCV   Type 2 diabetes mellitus (HCC)    Ureteral stent retained    Not retained. ureteral stent removal gross hematuria resolved 10/11. coumadin restarted.    Ventral hernia    Past Surgical History:  Procedure Laterality Date   AGILE CAPSULE N/A 08/17/2020   Procedure: AGILE CAPSULE;  Surgeon: Corbin Ade, MD;  Location: [facility];  Service: Endoscopy;  Laterality:  N/A;  7:30am   BREAST LUMPECTOMY     benign cyst   CATARACT EXTRACTION W/PHACO Left 08/17/2012   Procedure: CATARACT EXTRACTION PHACO AND INTRAOCULAR LENS PLACEMENT (IOC);  Surgeon: Gemma Payor, MD;  Location: [facility];  Service: Ophthalmology;  Laterality: Left;  CDE:18.73   CATARACT EXTRACTION W/PHACO Right 08/13/2019   Procedure: CATARACT EXTRACTION PHACO AND INTRAOCULAR  LENS PLACEMENT (IOC);  Surgeon: Fabio Pierce, MD;  Location: [facility];  Service: Ophthalmology;  Laterality: Right;  CDE: 7.14   COLECTOMY     2005. Dr. Salvadore Dom for diverticulitis   COLONOSCOPY  02/22/09   normal/left-sided diverticula/multiple colonic polyp, adenomatous   COLONOSCOPY  12/16/2011   colonic polyps-treated as described above. Status postprior sigmoid resection. adenomatous. next TCS 12/2014   COLONOSCOPY N/A 12/28/2014   Status post prior segmental resection. Few residual colonic diverticula- status post segmental biopsy negative random colon biopsies   ESOPHAGEAL DILATION N/A 12/28/2014   Procedure: ESOPHAGEAL DILATION;  Surgeon: Corbin Ade, MD;  Location: [facility];  Service: Endoscopy;  Laterality: N/A;   ESOPHAGOGASTRODUODENOSCOPY  02/22/09   normal s/p dilator;small HH/one large pedunculates polyp  s/p clipping, hyperplastic   ESOPHAGOGASTRODUODENOSCOPY  12/16/2011   [name]: small hiatal hernia. Hyperplastic apperating polyps. Status post Elease Hashimoto dilation as described above.   ESOPHAGOGASTRODUODENOSCOPY N/A 12/28/2014   RMR: Normal-appearing esophagus status post passage of a maloney dilator. Hiatal hernia. abnormal gastic mucosa of uncertain significance status post gastric biopsy with mild chronic gastritis, no H pylori.    TONSILLECTOMY     TOTAL ABDOMINAL HYSTERECTOMY     Social History:  reports that she has never smoked. She has never used smokeless tobacco. She reports that she does not drink alcohol and does not use drugs.  Allergies  Allergen Reactions   Adhesive [Tape] Itching    Very sensitive skin, tears easily   Demerol [Meperidine] Nausea And Vomiting   Iohexol Nausea And Vomiting    Patient states she almost died from severe n/v    Shellfish Allergy Diarrhea and Nausea And Vomiting   Penicillins Nausea And Vomiting and Rash    Family History  Problem Relation Age of Onset   Hypertension Father    Diabetes Father    Heart attack Father     Heart attack Mother    Diabetes Mother    Hypertension Mother    Depression Son    Pancreatitis Son    Coronary artery disease Other        Family Hx    Colon cancer Maternal Grandfather    Heart disease Sister    Mental retardation Brother    Hernia Son     Prior to Admission medications   Medication Sig Start Date End Date Taking? Authorizing Provider  albuterol (VENTOLIN HFA) 108 (90 Base) MCG/ACT inhaler Inhale 2 puffs into the lungs every 6 (six) hours as needed for wheezing or shortness of breath. 05/22/22   Del Nigel Berthold, FNP  amLODipine (NORVASC) 5 MG tablet Take 5 mg by mouth daily.    [provider]  apixaban (ELIQUIS) 2.5 MG TABS tablet TAKE 1 TABLET BY MOUTH TWICE DAILY Patient taking differently: Take 2.5 mg by mouth 2 (two) times daily. 06/20/22   Jonelle Sidle, MD  Continuous Blood Gluc Receiver (FREESTYLE LIBRE 2 READER) DEVI As directed 01/22/22   Roma Kayser, MD  Continuous Blood Gluc Sensor (FREESTYLE LIBRE 2 SENSOR) MISC 1 Piece by Does not apply route every 14 (fourteen) days. 11/22/21  Roma Kayser, MD  cyanocobalamin (VITAMIN B12) 1000 MCG tablet Take 1 tablet (1,000 mcg total) by mouth daily. 09/16/22 12/15/22  Vassie Loll, MD  glipiZIDE (GLUCOTROL XL) 5 MG 24 hr tablet Take 1 tablet (5 mg total) by mouth daily with breakfast. 05/27/22   Nida, Denman George, MD  GLOBAL EASE INJECT PEN NEEDLES 31G X 5 MM MISC USE ONE TIP WITH INSULIN AT BEDTIME 07/10/20   Nida, Denman George, MD  insulin glargine (LANTUS SOLOSTAR) 100 UNIT/ML Solostar Pen Inject 16 Units into the skin at bedtime. Patient taking differently: Inject 20 Units into the skin at bedtime. 05/27/22   Roma Kayser, MD  losartan (COZAAR) 50 MG tablet Take 1 tablet (50 mg total) by mouth daily. 09/17/22   Vassie Loll, MD  metoprolol tartrate (LOPRESSOR) 25 MG tablet TAKE 1/2 TABLET BY MOUTH TWICE DAILY 06/24/22   Jonelle Sidle, MD  nitroGLYCERIN  (NITROSTAT) 0.4 MG SL tablet PLACE 1 TABLET UNDER THE TONGUE EVERY 5 MINUTES FOR 3 DOSES AS NEEDED FOR CHEST PAIN. IF YOU HAVE NO RELIEF AFTER THE 3RD DOSE PROCEED TO THE ED FOR AN EVALUATION 06/15/19   Jonelle Sidle, MD  ondansetron (ZOFRAN-ODT) 8 MG disintegrating tablet Take 1 tablet (8 mg total) by mouth every 8 (eight) hours as needed for nausea or vomiting. 09/16/22   Vassie Loll, MD  pantoprazole (PROTONIX) 40 MG tablet TAKE 1 TABLET BY MOUTH TWICE DAILY Patient taking differently: Take 40 mg by mouth 2 (two) times daily. 08/21/22   [name], Gerrit Friends, MD  simvastatin (ZOCOR) 20 MG tablet Take 20 mg by mouth daily.  08/03/13   [provider]  triamcinolone cream (KENALOG) 0.1 % Apply 1 Application topically 2 (two) times daily as needed (irritation). 09/16/22   Vassie Loll, MD    Physical Exam: Vitals:   09/26/22 1730 09/26/22 1800 09/26/22 2001 09/26/22 2037  BP: 114/66 109/64  107/77  Pulse: 74 60  84  Resp: 17 17  18   Temp:   98.1 F (36.7 C)   TempSrc:   Oral   SpO2: 91% 95%  94%  Weight:      Height:       1.  General: Patient lying supine in bed,  no acute distress   2. Psychiatric: Nonverbal and not following commands at this time, too fatigued to assess further   3. Neurologic: Nonverbal and not following commands at this time, responds to verbal stimuli with opening her eyes, protecting her airway   4. HEENMT:  Head is atraumatic, normocephalic, pupils reactive to light, neck is supple, trachea is midline, mucous membranes are moist   5. Respiratory : Lungs are clear to auscultation bilaterally without wheezing, rhonchi, rales, no cyanosis, no increase in work of breathing or accessory muscle use   6. Cardiovascular : Heart rate normal, rhythm is irregular, murmur present, rubs or gallops, no peripheral edema, peripheral pulses palpated   7. Gastrointestinal:  Abdomen is soft, nondistended, nontender to palpation bowel sounds active, no masses or  organomegaly palpated   8. Skin:  Skin is warm, dry and intact without rashes, acute lesions, or ulcers on limited exam   9.Musculoskeletal:  No acute deformities or trauma, no asymmetry in tone, no peripheral edema, peripheral pulses palpated, no tenderness to palpation in the extremities  Data Reviewed: In the ED Temperature 98, heart rate 57-84, respiratory rate 14-22, blood pressure 107/50-146/77, satting 91-100% No leukocytosis with white blood cell count of 5.4, hemoglobin 13.4 Chemistry is  unremarkable Blood cultures pending EKG shows atrial fibrillation with a heart rate of 67 and a QTc of 4 and 76 Chest x-ray shows no evidence of acute cardiopulmonary disease Patient was given Rocephin and Flagyl in the ED CT chest abdomen pelvis shows resolution of sigmoid diverticulitis, chronic severe right hydronephrosis Nursing reported yeast vaginitis Admission was requested due to altered mental status and generalized weakness  Assessment and Plan: * Acute metabolic encephalopathy -Excessively fatigued -Wasn't answering questions appropriately at home -Not staying awake long enough to answer questions for me -Same as last presentation -No infection this time: afebrile, no leukocytosis, procalcitonin negative, CT chest, abd, pelvis without acute findings -Blood cultures and urine culture pending -Recently finished course of antibiotics for diverticulitis -Continue to monitor  Primary hypertension -Continue norvasc, losartan, metoprolol   Diabetes mellitus with stage 3 chronic kidney disease (HCC) -on basal insulin 20 units at baseline -Continue 10 units basal insulin -Sliding scale coverage -Continue to monitor  Atrial fibrillation, chronic (HCC) -Continue metoprolol and eliquis -Currently in afib on exam -Monitor on tele  GERD -Continue protonix  Mixed hyperlipidemia -continue statin      Advance Care Planning:   Code Status: Full Code  Consults: None at this  time, although with the frequency of hospital visits she may need TOC to discuss possibility of SNF  Family Communication: No family at bedside  Severity of Illness: The appropriate patient status for this patient is OBSERVATION. Observation status is judged to be reasonable and necessary in order to provide the required intensity of service to ensure the patient's safety. The patient's presenting symptoms, physical exam findings, and initial radiographic and laboratory data in the context of their medical condition is felt to place them at decreased risk for further clinical deterioration. Furthermore, it is anticipated that the patient will be medically stable for discharge from the hospital within 2 midnights of admission.   Author: Lilyan Gilford, DO 09/27/2022 5:28 AM  For on call review [email or web address].

## 2022-09-27 NOTE — TOC CM/SW Note (Signed)
Transition of Care [facility]) - Inpatient Brief Assessment   Patient Details  Name: [name] MRN: [id number] Date of Birth: 1935-02-07  Transition of Care [facility]) CM/SW Contact:    Elliot Gault, LCSW Phone Number: 09/27/2022, 2:31 PM   Clinical Narrative:  Pt from home. She was recently here with the same diagnosis. MD anticipating dc in 1-2 days. PT has been unable to assess pt today as pt was not agreeable. Notes indicate they will attempt follow up tomorrow.  TOC will follow and assist if needs arise.  Transition of Care Asessment: Insurance and Status: Insurance coverage has been reviewed Patient has primary care physician: Yes Home environment has been reviewed: From Home Prior level of function:: Needs assistance Prior/Current Home Services: No current home services Social Determinants of Health Reivew: SDOH reviewed no interventions necessary Readmission risk has been reviewed: Yes Transition of care needs: no transition of care needs at this time

## 2022-09-27 NOTE — Assessment & Plan Note (Signed)
Continue norvasc, losartan, metoprolol 

## 2022-09-27 NOTE — Progress Notes (Addendum)
PT Cancellation Note  Patient Details Name: [name] MRN: [id number] DOB: January 23, 1935   Cancelled Treatment:    Reason Eval/Treat Not Completed: Other (comment). When attempting supine to sit it was discovered patient was soiled. PT requested nursing for peri-care and new bed sheets. PT will attempt eval again at a later date.   Katina Dung. Hartnett-Rands, MS, PT [facility] [phone]  Britta Mccreedy  Hartnett-Rands 09/27/2022, 2:19 PM

## 2022-09-27 NOTE — Therapy (Signed)
Patient in bed sleeping on therapist arrival.  Difficult to rouse.  Once she started to wake up she became very upset with therapist moving her tray so did not continue to attempt eval.  Will attempt again at a later date.  8:24 AM, 09/27/22 Arik Husmann Small Enoch Moffa MPT [facility] [place] [phone]

## 2022-09-27 NOTE — Progress Notes (Signed)
Patient CBG at 0713 is 62. Gave 8 oz of orange juice and rechecked CBG at 072 and is now 102. Notified Dr. Gwenlyn Perking by [facility] page.

## 2022-09-28 DIAGNOSIS — E782 Mixed hyperlipidemia: Secondary | ICD-10-CM | POA: Diagnosis not present

## 2022-09-28 DIAGNOSIS — G9341 Metabolic encephalopathy: Secondary | ICD-10-CM | POA: Diagnosis not present

## 2022-09-28 DIAGNOSIS — I482 Chronic atrial fibrillation, unspecified: Secondary | ICD-10-CM | POA: Diagnosis not present

## 2022-09-28 LAB — URINE CULTURE

## 2022-09-28 LAB — GLUCOSE, CAPILLARY
Glucose-Capillary: 125 mg/dL — ABNORMAL HIGH (ref 70–99)
Glucose-Capillary: 172 mg/dL — ABNORMAL HIGH (ref 70–99)
Glucose-Capillary: 142 mg/dL — ABNORMAL HIGH (ref 70–99)
Glucose-Capillary: 194 mg/dL — ABNORMAL HIGH (ref 70–99)

## 2022-09-28 LAB — CBC
HCT: 31.6 % — ABNORMAL LOW (ref 36.0–46.0)
Hemoglobin: 10.3 g/dL — ABNORMAL LOW (ref 12.0–15.0)
MCH: 31.5 pg (ref 26.0–34.0)
MCHC: 32.6 g/dL (ref 30.0–36.0)
MCV: 96.6 fL (ref 80.0–100.0)
Platelets: 202 10*3/uL (ref 150–400)
RBC: 3.27 MIL/uL — ABNORMAL LOW (ref 3.87–5.11)
RDW: 14.6 % (ref 11.5–15.5)
WBC: 6.2 10*3/uL (ref 4.0–10.5)
nRBC: 0 % (ref 0.0–0.2)

## 2022-09-28 LAB — BASIC METABOLIC PANEL
Anion gap: 7 (ref 5–15)
BUN: 14 mg/dL (ref 8–23)
CO2: 25 mmol/L (ref 22–32)
Calcium: 8.5 mg/dL — ABNORMAL LOW (ref 8.9–10.3)
Chloride: 104 mmol/L (ref 98–111)
Creatinine, Ser: 1.14 mg/dL — ABNORMAL HIGH (ref 0.44–1.00)
GFR, Estimated: 47 mL/min — ABNORMAL LOW (ref >60)
Glucose, Bld: 146 mg/dL — ABNORMAL HIGH (ref 70–99)
Potassium: 3.7 mmol/L (ref 3.5–5.1)
Sodium: 136 mmol/L (ref 135–145)

## 2022-09-28 LAB — CULTURE, BLOOD (ROUTINE X 2)

## 2022-09-28 LAB — MAGNESIUM: Magnesium: 1.6 mg/dL — ABNORMAL LOW (ref 1.7–2.4)

## 2022-09-28 MED ORDER — MAGNESIUM SULFATE 2 GM/50ML IV SOLN
2.0000 g | Freq: Once | INTRAVENOUS | Status: AC
  Administered 2022-09-28: 2 g via INTRAVENOUS
  Filled 2022-09-28: qty 50

## 2022-09-28 MED ORDER — METOPROLOL SUCCINATE ER 25 MG PO TB24
12.5000 mg | ORAL_TABLET | Freq: Every day | ORAL | Status: DC
  Administered 2022-09-29 – 2022-10-01 (×3): 12.5 mg via ORAL
  Filled 2022-09-28 (×3): qty 1

## 2022-09-28 NOTE — Progress Notes (Signed)
Progress Note   Patient: [name] ZOX:[id number] DOB: [birth year] DOA: 09/26/2022     1 DOS: the patient was seen and examined on 09/28/2022   Brief hospital admission narrative: As per H&P written by Dr. Carren Rang on 09/27/22 [name] is a 88 y.o. female with medical history significant of anxiety, asthma, GERD, hypertension, fibromyalgia, hyperlipidemia, type 2 diabetes mellitus, and more presents the ED with a chief complaint of altered mental status and weakness.  Patient was recently discharged on 10 June.  At that time she had been admitted for altered mental status described as confusion and fatigue with generalized weakness.  She was diagnosed with diverticulitis.  She was given antibiotics in the hospital and discharged on p.o. antibiotics.  Family had reported to the ED staff that patient was improved, but 2 days after she finished her antibiotics she became worse again.  There is a note from her hospital follow-up visit on the 14th the reports patient was discharged on Cipro.  She reported feeling fairly well and increased p.o. intake from prior to her hospitalization.  They had no acute concerns to discuss at that appointment.  Today she had a CT of her chest abdomen pelvis that showed resolution of her sigmoid diverticulitis, chronic right hydronephrosis.  Unfortunately, patient is too fatigued to answer my questions.  It is reported that she was not answering questions appropriately at home, but she can even stay awake long enough to answer my questions.  She is not following commands.  She is protecting her airway.  She does moan in pain and opens her eyes briefly to verbal stimuli.   Assessment and Plan: * Acute metabolic encephalopathy -Excessively fatigued, deconditioned and disoriented at time of admission. -So far workup negative except for mild dehydration and B12 deficiency -Continue to maintain adequate fluid resuscitation, electrolyte repletion and B12  repletion/supplementation -Physical therapy has worked with patient and is recommending skilled nursing facility placement for rehabilitation and conditioning. -Mentation is better and patient more interactive on today's examination; per family member still not back to baseline. -Continue minimizing any sedative agents.  Primary hypertension -Continue norvasc, losartan and metoprolol -Vital signs overall stable.   Diabetes mellitus with stage 3 chronic kidney disease (HCC) -Mild hypoglycemia appreciated at time of admission -Continue adjusted dose of sliding scale insulin -Follow CBGs fluctuation -Discussed with patient the importance of not skipping meals.  Hypomagnesemia -Continue to follow electrolytes trend and further replete as needed.  AMS (altered mental status) -Acute metabolic encephalopathy present at time of admission -Continue supportive care, consult reorientation, adequate hydration/electrolyte repletion and B12 supplementation. -Physical therapy has seen patient with recommendations for SNF placement for further rehab and conditioning. -CT head without acute intracranial abnormal.  Atrial fibrillation, chronic (HCC) -Continue metoprolol and eliquis -Rate control -Continue telemetry monitoring -Goal is for potassium above 4 and mag is above 2 as much as possible.  GERD -Continue PPI.  Mixed hyperlipidemia -continue statin -Heart healthy diet discussed with patient and family.    Subjective:  More alert and following commands; still not back to baseline.  Afebrile, no chest pain, no nausea or vomiting.  Physical Exam: Vitals:   09/27/22 1430 09/27/22 2002 09/28/22 0524 09/28/22 0735  BP: (!) 149/66 (!) 149/80 (!) 142/69 123/65  Pulse: 62 71 74 68  Resp: 16   18  Temp: 98.6 F (37 C) 98.3 F (36.8 C) 99.1 F (37.3 C) 98.2 F (36.8 C)  TempSrc: Axillary Oral Oral Oral  SpO2: 96% 98% 99%  97%  Weight:      Height:       General exam: Alert, awake,  oriented x 2; intermittently disoriented and per family at bedside not back to baseline yet.  Weak and deconditioned on examination.  No focal deficit.  Afebrile, no nausea, no vomiting, no chest pain. Respiratory system: Clear to auscultation. Respiratory effort normal.  Good saturation on room air. Cardiovascular system:RRR. No rubs or gallops; no JVD. Gastrointestinal system: Abdomen is nondistended, soft and nontender. No organomegaly or masses felt. Normal bowel sounds heard. Central nervous system: No focal neurological deficits. Extremities: No cyanosis or clubbing. Skin: No petechiae. Psychiatry: Flat affect appreciated ; No suicidal ideation or hallucinations.  Data Reviewed: Basic metabolic panel: Sodium 136, potassium 3.7, chloride 104, bicarb 25, glucose 146, BUN 14, creatinine 1.14 and GFR 47 Magnesium: 1.6 CBC: WBC 6.2, hemoglobin 10.3 and platelet count 202K  Family Communication: Daughter at bedside.  Disposition: Status is: Inpatient Remains inpatient appropriate because: Continue electrolyte repletion, adequate hydration and follow response.  Patient mentation improving but not back to baseline.   Planned Discharge Destination: Skilled nursing facility  Time spent: 35 minutes  Author: Vassie Loll, MD 09/28/2022 12:45 PM  For on call review [email or web address].

## 2022-09-28 NOTE — Progress Notes (Signed)
Patient remains alert and oriented x self.

## 2022-09-28 NOTE — [place] FL2 (Signed)
[place] MEDICAID FL2 LEVEL OF CARE FORM     IDENTIFICATION  Patient Name: [name] Birthdate: [birth year] Sex: female Admission Date (Current Location): 09/26/2022  [facility] and [place] Number:  [employer] and Address:  [facility],  [address], [place] [address]      Provider Number:    Attending Physician Name and Address:  Vassie Loll, MD  Relative Name and Phone Number:  [name] (Daughter) [phone] [facility])    Current Level of Care: Hospital Recommended Level of Care: Skilled Nursing Facility Prior Approval Number:    Date Approved/Denied:   PASRR Number: [id number]  Discharge Plan: SNF    Current Diagnoses: Patient Active Problem List   Diagnosis Date Noted   Hypomagnesemia 09/28/2022   Atrial fibrillation, chronic (HCC) 09/27/2022   AMS (altered mental status) 09/27/2022   Acute kidney injury superimposed on chronic kidney disease (HCC) 09/20/2022   Diverticulitis of large intestine without perforation or abscess without bleeding 09/16/2022   Weakness 09/16/2022   B12 deficiency 09/16/2022   Acute metabolic encephalopathy 09/15/2022   Pressure injury of skin 09/15/2022   Lichen planus, unspecified 06/20/2022   Chronic bilateral low back pain 05/22/2022   Encounter for routine adult physical exam with abnormal findings 04/06/2022   Class 1 obesity due to excess calories with serious comorbidity and body mass index (BMI) of 34.0 to 34.9 in adult 11/22/2021   Hypothyroidism 05/24/2021   Constipation 09/24/2018   Alternating constipation and diarrhea 11/07/2017   Rectal bleeding 11/07/2017   RLQ abdominal pain 08/15/2016   Diabetic complication (HCC) 07/14/2015   Mucosal abnormality of stomach    Diverticulosis of colon without hemorrhage    Dysphagia, pharyngoesophageal phase    Esophageal dysphagia 12/02/2014   LLQ pain 02/22/2014   Abdominal pain, epigastric 02/22/2014   Adenomatous polyps 11/21/2011    Long term (current) use of anticoagulants 07/03/2010   Permanent atrial fibrillation (HCC) 08/04/2009   RENAL DISEASE, CHRONIC, STAGE III 08/04/2009   IBS 04/14/2009   Diabetes mellitus with stage 3 chronic kidney disease (HCC) 02/17/2009   Anxiety 02/17/2009   GERD 02/17/2009   DIARRHEA, CHRONIC 02/17/2009   Mixed hyperlipidemia 12/21/2008   Primary hypertension 12/21/2008    Orientation RESPIRATION BLADDER Height & Weight     Situation, Self, Place  Normal Continent Weight: 159 lb 12.8 oz (72.5 kg) Height:  5\' 3"  (160 cm)  BEHAVIORAL SYMPTOMS/MOOD NEUROLOGICAL BOWEL NUTRITION STATUS      Continent Diet  AMBULATORY STATUS COMMUNICATION OF NEEDS Skin   Limited Assist Verbally Normal                       Personal Care Assistance Level of Assistance  Bathing, Dressing Bathing Assistance: Limited assistance   Dressing Assistance: Limited assistance     Functional Limitations Info  Sight Sight Info: Impaired (Glasses)        SPECIAL CARE FACTORS FREQUENCY                       Contractures Contractures Info: Not present    Additional Factors Info  Code Status Code Status Info: Full             Current Medications (09/28/2022):  This is the current hospital active medication list Current Facility-Administered Medications  Medication Dose Route Frequency Provider Last Rate Last Admin   acetaminophen (TYLENOL) tablet 650 mg  650 mg Oral Q6H PRN Zierle-Ghosh, Asia B, DO  650 mg at 09/28/22 1500   Or   acetaminophen (TYLENOL) suppository 650 mg  650 mg Rectal Q6H PRN Zierle-Ghosh, Asia B, DO       amLODipine (NORVASC) tablet 5 mg  5 mg Oral Daily Zierle-Ghosh, Asia B, DO   5 mg at 09/28/22 4098   apixaban (ELIQUIS) tablet 2.5 mg  2.5 mg Oral BID Zierle-Ghosh, Asia B, DO   2.5 mg at 09/28/22 1191   cyanocobalamin (VITAMIN B12) injection 1,000 mcg  1,000 mcg Intramuscular Daily Vassie Loll, MD   1,000 mcg at 09/28/22 0928   fluconazole (DIFLUCAN)  tablet 150 mg  150 mg Oral Daily Zierle-Ghosh, Asia B, DO   150 mg at 09/28/22 0924   insulin aspart (novoLOG) injection 0-6 Units  0-6 Units Subcutaneous TID WC Vassie Loll, MD   1 Units at 09/28/22 1239   losartan (COZAAR) tablet 50 mg  50 mg Oral Daily Zierle-Ghosh, Asia B, DO   50 mg at 09/28/22 0924   [START ON 09/29/2022] metoprolol succinate (TOPROL-XL) 24 hr tablet 12.5 mg  12.5 mg Oral Daily Vassie Loll, MD       nystatin (MYCOSTATIN/NYSTOP) topical powder   Topical BID Vassie Loll, MD   Given at 09/28/22 0932   ondansetron (ZOFRAN) tablet 4 mg  4 mg Oral Q6H PRN Zierle-Ghosh, Asia B, DO       Or   ondansetron (ZOFRAN) injection 4 mg  4 mg Intravenous Q6H PRN Zierle-Ghosh, Asia B, DO   4 mg at 09/26/22 2251   oxyCODONE (Oxy IR/ROXICODONE) immediate release tablet 5 mg  5 mg Oral Q6H PRN Vassie Loll, MD       pantoprazole (PROTONIX) EC tablet 40 mg  40 mg Oral BID Zierle-Ghosh, Asia B, DO   40 mg at 09/28/22 4782   simvastatin (ZOCOR) tablet 20 mg  20 mg Oral Daily Zierle-Ghosh, Asia B, DO   20 mg at 09/28/22 9562     Discharge Medications: Please see discharge summary for a list of discharge medications.  Relevant Imaging Results:  Relevant Lab Results:   Additional Information [id number]  Catalina Gravel, LCSW

## 2022-09-28 NOTE — Plan of Care (Signed)
  Problem: Clinical Measurements: Goal: Ability to maintain clinical measurements within normal limits will improve Outcome: Progressing Goal: Will remain free from infection Outcome: Progressing Goal: Diagnostic test results will improve Outcome: Progressing Goal: Respiratory complications will improve Outcome: Progressing Goal: Cardiovascular complication will be avoided Outcome: Progressing   Problem: Activity: Goal: Risk for activity intolerance will decrease Outcome: Progressing   Problem: Nutrition: Goal: Adequate nutrition will be maintained Outcome: Progressing   Problem: Coping: Goal: Level of anxiety will decrease Outcome: Progressing   Problem: Elimination: Goal: Will not experience complications related to bowel motility Outcome: Progressing Goal: Will not experience complications related to urinary retention Outcome: Progressing   Problem: Pain Managment: Goal: General experience of comfort will improve Outcome: Progressing   Problem: Safety: Goal: Ability to remain free from injury will improve Outcome: Progressing   Problem: Skin Integrity: Goal: Risk for impaired skin integrity will decrease Outcome: Progressing   Problem: Education: Goal: Ability to describe self-care measures that may prevent or decrease complications (Diabetes Survival Skills Education) will improve Outcome: Progressing Goal: Individualized Educational Video(s) Outcome: Progressing   Problem: Coping: Goal: Ability to adjust to condition or change in health will improve Outcome: Progressing   Problem: Fluid Volume: Goal: Ability to maintain a balanced intake and output will improve Outcome: Progressing   Problem: Health Behavior/Discharge Planning: Goal: Ability to identify and utilize available resources and services will improve Outcome: Progressing Goal: Ability to manage health-related needs will improve Outcome: Progressing   Problem: Metabolic: Goal: Ability to  maintain appropriate glucose levels will improve Outcome: Progressing   Problem: Nutritional: Goal: Maintenance of adequate nutrition will improve Outcome: Progressing Goal: Progress toward achieving an optimal weight will improve Outcome: Progressing   Problem: Skin Integrity: Goal: Risk for impaired skin integrity will decrease Outcome: Progressing   Problem: Tissue Perfusion: Goal: Adequacy of tissue perfusion will improve Outcome: Progressing   

## 2022-09-28 NOTE — Plan of Care (Signed)
  Problem: Acute Rehab PT Goals(only PT should resolve) Goal: Pt Will Go Supine/Side To Sit Flowsheets (Taken 09/28/2022 1101) Pt will go Supine/Side to Sit: with modified independence Goal: Pt Will Go Sit To Supine/Side Flowsheets (Taken 09/28/2022 1101) Pt will go Sit to Supine/Side: with modified independence Goal: Pt Will Transfer Bed To Chair/Chair To Bed Flowsheets (Taken 09/28/2022 1101) Pt will Transfer Bed to Chair/Chair to Bed: with supervision Goal: Pt Will Ambulate Flowsheets (Taken 09/28/2022 1101) Pt will Ambulate:  50 feet  with min guard assist  with rolling walker

## 2022-09-28 NOTE — Evaluation (Signed)
Physical Therapy Evaluation Patient Details Name: [name] MRN: [id number] DOB: 1934-05-23 Today's Date: 09/28/2022  History of Present Illness  [name] is a 88 y.o. female with medical history significant of anxiety, asthma, GERD, hypertension, fibromyalgia, hyperlipidemia, type 2 diabetes mellitus, and more presents the ED with a chief complaint of altered mental status and weakness.  Patient was recently discharged on 10 June.  At that time she had been admitted for altered mental status described as confusion and fatigue with generalized weakness.  She was diagnosed with diverticulitis.  Clinical Impression  Pt initially not eager to work with therapy but motivation increased as session progressed.  Pt able to ambulate 10 ft with RW with assist.  Pt will benefit from skilled PT to improve strength and balance to decrease risk of falls.        Recommendations for follow up therapy are one component of a multi-disciplinary discharge planning process, led by the attending physician.  Recommendations may be updated based on patient status, additional functional criteria and insurance authorization.  Follow Up Recommendations Can patient physically be transported by private vehicle: No     Assistance Recommended at Discharge Frequent or constant Supervision/Assistance  Patient can return home with the following  A little help with walking and/or transfers;Assistance with cooking/housework    Equipment Recommendations None recommended by PT  Recommendations for Other Services       Functional Status Assessment Patient has had a recent decline in their functional status and demonstrates the ability to make significant improvements in function in a reasonable and predictable amount of time.     Precautions / Restrictions Precautions Precautions: Fall Restrictions Weight Bearing Restrictions: No      Mobility  Bed Mobility Overal bed mobility: Needs Assistance Bed  Mobility: Supine to Sit     Supine to sit: Mod assist          Transfers Overall transfer level: Needs assistance Equipment used: Rolling walker (2 wheels) Transfers: Bed to chair/wheelchair/BSC   Stand pivot transfers: Min assist              Ambulation/Gait Ambulation/Gait assistance: Min assist Gait Distance (Feet): 10 Feet Assistive device: Rolling walker (2 wheels) Gait Pattern/deviations: Decreased step length - right, Decreased step length - left               Pertinent Vitals/Pain Pain Assessment Pain Assessment: No/denies pain    Home Living Family/patient expects to be discharged to:: Private residence Living Arrangements: Alone Available Help at Discharge: Family Type of Home: Mobile home Home Access: Stairs to enter   Secretary/administrator of Steps: 3   Home Layout: One level Home Equipment: Agricultural consultant (2 wheels)      Prior Function Prior Level of Function : Needs assist                        Extremity/Trunk Assessment        Lower Extremity Assessment Lower Extremity Assessment: Generalized weakness       Communication   Communication: No difficulties  Cognition Arousal/Alertness: Awake/alert   Overall Cognitive Status: Within Functional Limits for tasks assessed                                                 Assessment/Plan    PT Assessment Patient  needs continued PT services  PT Problem List Decreased strength;Decreased balance;Decreased activity tolerance       PT Treatment Interventions Gait training;Functional mobility training;Therapeutic exercise    PT Goals (Current goals can be found in the Care Plan section)  Acute Rehab PT Goals Patient Stated Goal: to go home Time For Goal Achievement: 10/18/22 Potential to Achieve Goals: Good    Frequency Min 2X/week        AM-PAC PT "6 Clicks" Mobility  Outcome Measure Help needed turning from your back to your side while in a  flat bed without using bedrails?: A Little Help needed moving from lying on your back to sitting on the side of a flat bed without using bedrails?: A Lot Help needed moving to and from a bed to a chair (including a wheelchair)?: A Little Help needed standing up from a chair using your arms (e.g., wheelchair or bedside chair)?: A Little Help needed to walk in hospital room?: A Lot Help needed climbing 3-5 steps with a railing? : A Lot 6 Click Score: 15    End of Session Equipment Utilized During Treatment: Gait belt Activity Tolerance: Patient tolerated treatment well Patient left: in chair;with call bell/phone within reach;with chair alarm set Nurse Communication: Mobility status PT Visit Diagnosis: Unsteadiness on feet (R26.81);Muscle weakness (generalized) (M62.81)    Time: 1030-1102 PT Time Calculation (min) (ACUTE ONLY): 32 min   Charges:   PT Evaluation $PT Eval Moderate Complexity: 1 Mod     Virgina Organ, PT CLT [phone]  09/28/2022, 11:04 AM

## 2022-09-28 NOTE — Progress Notes (Addendum)
Central telemetry called and stated that patient had a 2.31 second pause. Patient up in chair. No signs / symptoms. Notified Dr. Gwenlyn Perking.   See new orders.

## 2022-09-28 NOTE — Assessment & Plan Note (Signed)
-  Acute metabolic encephalopathy present at time of admission -Continue supportive care, consult reorientation, adequate hydration/electrolyte repletion and B12 supplementation. -Physical therapy has seen patient with recommendations for SNF placement for further rehab and conditioning. -CT head without acute intracranial abnormal.

## 2022-09-28 NOTE — Progress Notes (Addendum)
Central telemetry called and stated that patient had a slow ventricular response at 37 bpm and a 2.15 second pause. No s/sx of distress. Notified Dr. Gwenlyn Perking in person. No new orders.

## 2022-09-28 NOTE — Assessment & Plan Note (Signed)
-  Continue to follow electrolytes trend and further replete as needed.

## 2022-09-28 NOTE — TOC Progression Note (Signed)
Transition of Care [facility]) - Progression Note    Patient Details  Name: [name] MRN: [id number] Date of Birth: [birth year]  Transition of Care [facility]) CM/SW Contact  Catalina Gravel, LCSW Phone Number: 09/28/2022, 4:04 PM  Clinical Narrative:    PT evaluated pt and recommended SNF.  Due to pt level of alertness contacted daughter.  Family agreeable to rehabilitation. Per daughter, mother lives with 2 sons.  Daughter lives closer to [place].  Daughter asked if there are facilities in her area as well that can be considered.  CSW put zip code in [place]- no facilities populated.  CSW agreed to initiate authorization and family will consider facilities. [id number].      Barriers to Discharge: Continued Medical Work up  Expected Discharge Plan and Services                                               Social Determinants of Health (SDOH) Interventions SDOH Screenings   Food Insecurity: No Food Insecurity (09/15/2022)  Recent Concern: Food Insecurity - Food Insecurity Present (06/21/2022)  Housing: Low Risk  (09/15/2022)  Recent Concern: Housing - Medium Risk (06/21/2022)  Transportation Needs: No Transportation Needs (09/15/2022)  Recent Concern: Transportation Needs - Unmet Transportation Needs (06/21/2022)  Utilities: Not At Risk (09/15/2022)  Depression (PHQ2-9): High Risk (09/20/2022)  Tobacco Use: Low Risk  (09/25/2022)    Readmission Risk Interventions     No data to display

## 2022-09-29 DIAGNOSIS — E782 Mixed hyperlipidemia: Secondary | ICD-10-CM | POA: Diagnosis not present

## 2022-09-29 DIAGNOSIS — I482 Chronic atrial fibrillation, unspecified: Secondary | ICD-10-CM | POA: Diagnosis not present

## 2022-09-29 DIAGNOSIS — G9341 Metabolic encephalopathy: Secondary | ICD-10-CM | POA: Diagnosis not present

## 2022-09-29 LAB — GLUCOSE, CAPILLARY
Glucose-Capillary: 106 mg/dL — ABNORMAL HIGH (ref 70–99)
Glucose-Capillary: 140 mg/dL — ABNORMAL HIGH (ref 70–99)
Glucose-Capillary: 180 mg/dL — ABNORMAL HIGH (ref 70–99)

## 2022-09-29 LAB — CULTURE, BLOOD (ROUTINE X 2)

## 2022-09-29 MED ORDER — ENSURE ENLIVE PO LIQD
237.0000 mL | Freq: Two times a day (BID) | ORAL | Status: DC
  Administered 2022-09-29 – 2022-10-01 (×5): 237 mL via ORAL

## 2022-09-29 MED ORDER — QUETIAPINE FUMARATE 25 MG PO TABS
12.5000 mg | ORAL_TABLET | Freq: Every day | ORAL | Status: DC
  Administered 2022-09-29 – 2022-09-30 (×2): 12.5 mg via ORAL
  Filled 2022-09-29 (×2): qty 1

## 2022-09-29 NOTE — TOC Progression Note (Signed)
Transition of Care [facility]) - Progression Note    Patient Details  Name: [name] MRN: [id number] Date of Birth: 09-26-1934  Transition of Care [facility]) CM/SW Contact  Catalina Gravel, LCSW Phone Number: 09/29/2022, 4:50 PM  Clinical Narrative:     CSW contacted by daughter, asked to add [facility] to SNF bed offers. Also shared that while visiting, noticed mother has one blck took- concerned it may be medical.  She asked RN to share with Dr. And CSW secure chat to nurse inquiring if she had a chance to share, as daughter inquiring.  TOC to follow.     Barriers to Discharge: Continued Medical Work up  Expected Discharge Plan and Services                                               Social Determinants of Health (SDOH) Interventions SDOH Screenings   Food Insecurity: No Food Insecurity (09/15/2022)  Recent Concern: Food Insecurity - Food Insecurity Present (06/21/2022)  Housing: Low Risk  (09/15/2022)  Recent Concern: Housing - Medium Risk (06/21/2022)  Transportation Needs: No Transportation Needs (09/15/2022)  Recent Concern: Transportation Needs - Unmet Transportation Needs (06/21/2022)  Utilities: Not At Risk (09/15/2022)  Depression (PHQ2-9): High Risk (09/20/2022)  Tobacco Use: Low Risk  (09/25/2022)    Readmission Risk Interventions     No data to display

## 2022-09-29 NOTE — Progress Notes (Signed)
Progress Note   Patient: [name] ZOX:[id number] DOB: [birth year] DOA: 09/26/2022     2 DOS: the patient was seen and examined on 09/29/2022   Brief hospital admission narrative: As per H&P written by Dr. Carren Rang on 09/27/22 [name] is a 88 y.o. female with medical history significant of anxiety, asthma, GERD, hypertension, fibromyalgia, hyperlipidemia, type 2 diabetes mellitus, and more presents the ED with a chief complaint of altered mental status and weakness.  Patient was recently discharged on 10 June.  At that time she had been admitted for altered mental status described as confusion and fatigue with generalized weakness.  She was diagnosed with diverticulitis.  She was given antibiotics in the hospital and discharged on p.o. antibiotics.  Family had reported to the ED staff that patient was improved, but 2 days after she finished her antibiotics she became worse again.  There is a note from her hospital follow-up visit on the 14th the reports patient was discharged on Cipro.  She reported feeling fairly well and increased p.o. intake from prior to her hospitalization.  They had no acute concerns to discuss at that appointment.  Today she had a CT of her chest abdomen pelvis that showed resolution of her sigmoid diverticulitis, chronic right hydronephrosis.  Unfortunately, patient is too fatigued to answer my questions.  It is reported that she was not answering questions appropriately at home, but she can even stay awake long enough to answer my questions.  She is not following commands.  She is protecting her airway.  She does moan in pain and opens her eyes briefly to verbal stimuli.   Assessment and Plan: * Acute metabolic encephalopathy -Excessively fatigued, deconditioned and disoriented at time of admission. -So far workup negative except for mild dehydration and B12 deficiency -Continue to maintain adequate fluid resuscitation, electrolyte repletion and B12  repletion/supplementation -Physical therapy has worked with patient and is recommending skilled nursing facility placement for rehabilitation and conditioning. -Mentation is better and patient more interactive on today's examination; per family member still not back to baseline. -Continue minimizing any sedative agents.  Primary hypertension -Continue norvasc, losartan and metoprolol -Vital signs overall stable.   Diabetes mellitus with stage 3 chronic kidney disease (HCC) -Mild hypoglycemia appreciated at time of admission -Continue adjusted dose of sliding scale insulin -Follow CBGs fluctuation -Discussed with patient the importance of not skipping meals.  Hypomagnesemia -Continue to follow electrolytes trend and further replete as needed.  AMS (altered mental status) -Acute metabolic encephalopathy present at time of admission -Continue supportive care, consult reorientation, adequate hydration/electrolyte repletion and B12 supplementation. -Physical therapy has seen patient with recommendations for SNF placement for further rehab and conditioning. -CT head without acute intracranial abnormal.  Atrial fibrillation, chronic (HCC) -Continue metoprolol and eliquis -Rate control -Continue telemetry monitoring -Goal is for potassium above 4 and mag is above 2 as much as possible.  GERD -Continue PPI.  Mixed hyperlipidemia -continue statin -Heart healthy diet discussed with patient and family.   Subjective:  Patient with improved alertness; good night sleep reported.  Continue to experience intermittent episodes of confusion and delirium as per nursing and family report.  No fever, no nausea, no vomiting, no chest pain.  CBGs has remained stable and patient has not experienced any further episodes of hypoglycemia.  Physical Exam: Vitals:   09/28/22 2044 09/29/22 0000 09/29/22 0540 09/29/22 1300  BP: 134/60  (!) 142/67 (!) 151/85  Pulse: 68  74 95  Resp: 18 19  20  Temp:  98.5 F (36.9 C)  98.4 F (36.9 C) 99.6 F (37.6 C)  TempSrc: Oral  Oral Oral  SpO2: 92%  95% 96%  Weight:      Height:       General exam: Alert, awake, oriented x x 2 intermittently; per nursing report and also family statements patient continue experiencing intermittent episodes of confusion and delirium. Respiratory system: Clear to auscultation. Respiratory effort normal.  No using accessory muscles. Cardiovascular system: Rate controlled, no rubs, no gallops, no JVD. Gastrointestinal system: Abdomen is nondistended, soft and nontender. No organomegaly or masses felt. Normal bowel sounds heard. Central nervous system: No focal neurological deficits. Extremities: No cyanosis or clubbing. Skin: No petechiae. Psychiatry: Judgement and insight appear impaired currently; stable mood.  Latest Data Reviewed: Basic metabolic panel: Sodium 136, potassium 3.7, chloride 104, bicarb 25, glucose 146, BUN 14, creatinine 1.14 and GFR 47 Magnesium: 1.6 CBC: WBC 6.2, hemoglobin 10.3 and platelet count 202K  Family Communication: Daughter at bedside.  Disposition: Status is: Inpatient Remains inpatient appropriate because: Continue electrolyte repletion, adequate hydration and follow response.  Patient mentation improving but not back to baseline.   Planned Discharge Destination: Skilled nursing facility  Time spent: 35 minutes  Author: Vassie Loll, MD 09/29/2022 5:06 PM  For on call review [email or web address].

## 2022-09-30 DIAGNOSIS — I482 Chronic atrial fibrillation, unspecified: Secondary | ICD-10-CM | POA: Diagnosis not present

## 2022-09-30 DIAGNOSIS — G9341 Metabolic encephalopathy: Secondary | ICD-10-CM | POA: Diagnosis not present

## 2022-09-30 DIAGNOSIS — E782 Mixed hyperlipidemia: Secondary | ICD-10-CM | POA: Diagnosis not present

## 2022-09-30 LAB — GLUCOSE, CAPILLARY
Glucose-Capillary: 121 mg/dL — ABNORMAL HIGH (ref 70–99)
Glucose-Capillary: 246 mg/dL — ABNORMAL HIGH (ref 70–99)
Glucose-Capillary: 244 mg/dL — ABNORMAL HIGH (ref 70–99)
Glucose-Capillary: 197 mg/dL — ABNORMAL HIGH (ref 70–99)
Glucose-Capillary: 171 mg/dL — ABNORMAL HIGH (ref 70–99)

## 2022-09-30 LAB — BASIC METABOLIC PANEL
Anion gap: 5 (ref 5–15)
BUN: 15 mg/dL (ref 8–23)
CO2: 26 mmol/L (ref 22–32)
Calcium: 8.6 mg/dL — ABNORMAL LOW (ref 8.9–10.3)
Chloride: 103 mmol/L (ref 98–111)
Creatinine, Ser: 1.11 mg/dL — ABNORMAL HIGH (ref 0.44–1.00)
GFR, Estimated: 48 mL/min — ABNORMAL LOW (ref >60)
Glucose, Bld: 118 mg/dL — ABNORMAL HIGH (ref 70–99)
Potassium: 3.9 mmol/L (ref 3.5–5.1)
Sodium: 134 mmol/L — ABNORMAL LOW (ref 135–145)

## 2022-09-30 LAB — CBC
HCT: 31.2 % — ABNORMAL LOW (ref 36.0–46.0)
Hemoglobin: 10.3 g/dL — ABNORMAL LOW (ref 12.0–15.0)
MCH: 31.7 pg (ref 26.0–34.0)
MCHC: 33 g/dL (ref 30.0–36.0)
MCV: 96 fL (ref 80.0–100.0)
Platelets: 210 10*3/uL (ref 150–400)
RBC: 3.25 MIL/uL — ABNORMAL LOW (ref 3.87–5.11)
RDW: 14.6 % (ref 11.5–15.5)
WBC: 4.4 10*3/uL (ref 4.0–10.5)
nRBC: 0 % (ref 0.0–0.2)

## 2022-09-30 LAB — CULTURE, BLOOD (ROUTINE X 2)

## 2022-09-30 LAB — VITAMIN B1: Vitamin B1 (Thiamine): 104.7 nmol/L (ref 66.5–200.0)

## 2022-09-30 LAB — MAGNESIUM: Magnesium: 1.6 mg/dL — ABNORMAL LOW (ref 1.7–2.4)

## 2022-09-30 MED ORDER — MAGNESIUM OXIDE -MG SUPPLEMENT 400 (240 MG) MG PO TABS
400.0000 mg | ORAL_TABLET | Freq: Every day | ORAL | Status: DC
  Administered 2022-09-30 – 2022-10-01 (×2): 400 mg via ORAL
  Filled 2022-09-30 (×2): qty 1

## 2022-09-30 NOTE — Progress Notes (Signed)
Sister in law Rosey Bath would like to be contacted before pt leaves to [place].

## 2022-09-30 NOTE — Progress Notes (Signed)
Progress Note   Patient: [name] ZOX:[id number] DOB: 1934/11/10 DOA: 09/26/2022     3 DOS: the patient was seen and examined on 09/30/2022   Brief hospital admission narrative: As per H&P written by Dr. Carren Rang on 09/27/22 [name] is a 88 y.o. female with medical history significant of anxiety, asthma, GERD, hypertension, fibromyalgia, hyperlipidemia, type 2 diabetes mellitus, and more presents the ED with a chief complaint of altered mental status and weakness.  Patient was recently discharged on 10 June.  At that time she had been admitted for altered mental status described as confusion and fatigue with generalized weakness.  She was diagnosed with diverticulitis.  She was given antibiotics in the hospital and discharged on p.o. antibiotics.  Family had reported to the ED staff that patient was improved, but 2 days after she finished her antibiotics she became worse again.  There is a note from her hospital follow-up visit on the 14th the reports patient was discharged on Cipro.  She reported feeling fairly well and increased p.o. intake from prior to her hospitalization.  They had no acute concerns to discuss at that appointment.  Today she had a CT of her chest abdomen pelvis that showed resolution of her sigmoid diverticulitis, chronic right hydronephrosis.  Unfortunately, patient is too fatigued to answer my questions.  It is reported that she was not answering questions appropriately at home, but she can even stay awake long enough to answer my questions.  She is not following commands.  She is protecting her airway.  She does moan in pain and opens her eyes briefly to verbal stimuli.   Assessment and Plan: * Acute metabolic encephalopathy -Excessively fatigued, deconditioned and disoriented at time of admission. -So far workup negative except for mild dehydration and B12 deficiency -Continue to maintain adequate fluid resuscitation, electrolyte repletion and B12  repletion/supplementation -Physical therapy has worked with patient and is recommending skilled nursing facility placement for rehabilitation and conditioning. -Mentation is better and patient more interactive on today's examination; per family member still not back to baseline. -Continue minimizing any sedative agents.  Primary hypertension -Continue norvasc, losartan and metoprolol -Vital signs overall stable.   Diabetes mellitus with stage 3 chronic kidney disease (HCC) -Mild hypoglycemia appreciated at time of admission -Continue adjusted dose of sliding scale insulin -Follow CBGs fluctuation -Discussed with patient the importance of not skipping meals.  Hypomagnesemia -Continue to follow electrolytes trend and further replete as needed.  AMS (altered mental status) -Acute metabolic encephalopathy present at time of admission -Continue supportive care, consult reorientation, adequate hydration/electrolyte repletion and B12 supplementation. -Physical therapy has seen patient with recommendations for SNF placement for further rehab and conditioning. -CT head without acute intracranial abnormal.  Atrial fibrillation, chronic (HCC) -Continue metoprolol and eliquis -Rate control -Continue telemetry monitoring -Goal is for potassium above 4 and mag is above 2 as much as possible.  GERD -Continue PPI.  Mixed hyperlipidemia -continue statin -Heart healthy diet discussed with patient and family.   Subjective:  No chest pain, no nausea, no vomiting, no fever.  Per family members continue to experience intermittent episodes of confusion and disorganized thoughts.  No focal deficits.  Physical Exam: Vitals:   09/29/22 0540 09/29/22 1300 09/30/22 0447 09/30/22 1402  BP: (!) 142/67 (!) 151/85 (!) 148/69 125/72  Pulse: 74 95 68 84  Resp:  20 18 18   Temp: 98.4 F (36.9 C) 99.6 F (37.6 C) 98.5 F (36.9 C) 99 F (37.2 C)  TempSrc: Oral Oral  Oral Oral  SpO2: 95% 96% 93% 100%   Weight:      Height:       General exam: Alert, awake, oriented x 2; Colmer and more interactive; still with intermittent episodes of confusion per family members.  Poor dentition appreciated (no redness, bumps or drainage in her Gum). Respiratory system: Clear to auscultation. Respiratory effort normal.  Good saturation on room air. Cardiovascular system:Rate controlled. No rubs or gallops Gastrointestinal system: Abdomen is nondistended, soft and nontender. No organomegaly or masses felt. Normal bowel sounds heard. Central nervous system: Moving 4 limbs spontaneously no focal neurological deficits. Extremities: No cyanosis or clubbing. Skin: No petechiae.   Psychiatry: Stable mood.  Insight impaired with concern for underlying cognitive impairment.  Latest Data Reviewed: Basic metabolic panel: Sodium 134, potassium 3.9, chloride 103, bicarb 26, glucose 118, BUN 15, creatinine 1.11, GFR 48 CBC: WBCs 4.4, hemoglobin 10.3 and platelet count 210 K Magnesium: 1.6  Family Communication: Daughter at bedside.  Disposition: Status is: Inpatient Remains inpatient appropriate because: Continue electrolyte repletion, adequate hydration and follow response.  Patient mentation improving but not back to baseline.   Planned Discharge Destination: Skilled nursing facility  Time spent: 35 minutes  Author: Vassie Loll, MD 09/30/2022 3:49 PM  For on call review [email or web address].

## 2022-09-30 NOTE — TOC Progression Note (Signed)
Transition of Care [facility]) - Progression Note    Patient Details  Name: [name] MRN: [id number] Date of Birth: December 14, 1934  Transition [facility] Care [facility]) CM/SW Contact  Elliot Gault, LCSW Phone Number: 09/30/2022, 3:14 PM  Clinical Narrative:     TOC following. Spoke with pt's daughter to update on SNF bed offers. Pt/family select [facility]. Updated Brandi at [facility].  Plan is for dc tomorrow. Pt has auth. Awaiting PASRR. Have sent requested documentation.  Will follow up in AM.  Expected Discharge Plan: Skilled Nursing Facility Barriers to Discharge: Continued Medical Work up  Expected Discharge Plan and Services In-house Referral: Clinical Social Work   Post Acute Care Choice: Skilled Nursing Facility Living arrangements for the past 2 months: Single Family Home                                       Social Determinants of Health (SDOH) Interventions SDOH Screenings   Food Insecurity: No Food Insecurity (09/15/2022)  Recent Concern: Food Insecurity - Food Insecurity Present (06/21/2022)  Housing: Low Risk  (09/15/2022)  Recent Concern: Housing - Medium Risk (06/21/2022)  Transportation Needs: No Transportation Needs (09/15/2022)  Recent Concern: Transportation Needs - Unmet Transportation Needs (06/21/2022)  Utilities: Not At Risk (09/15/2022)  Depression (PHQ2-9): High Risk (09/20/2022)  Tobacco Use: Low Risk  (09/25/2022)    Readmission Risk Interventions     No data to display

## 2022-09-30 NOTE — Progress Notes (Signed)
Family member in room and states that patient was sitting on side of bed talking and suddenly fell over on her, states that patient passed out and felt limp. Vital signs are normal. Pt still has confusion. MD notified.   09/30/22 1402  Vitals  Temp 99 F (37.2 C)  Temp Source Oral  BP 125/72  MAP (mmHg) 72  BP Location Right Arm  BP Method Automatic  Patient Position (if appropriate) Lying  Pulse Rate 84  Pulse Rate Source Dinamap  Resp 18  Level of Consciousness  Level of Consciousness Alert  MEWS COLOR  MEWS Score Color Green  Oxygen Therapy  SpO2 100 %  MEWS Score  MEWS Temp 0  MEWS Systolic 0  MEWS Pulse 0  MEWS RR 0  MEWS LOC 0  MEWS Score 0

## 2022-09-30 NOTE — Progress Notes (Signed)
Physical Therapy Treatment Patient Details Name: [name] MRN: [id number] DOB: 1935/03/24 Today's Date: 09/30/2022   History of Present Illness [name] is a 88 y.o. female with medical history significant of anxiety, asthma, GERD, hypertension, fibromyalgia, hyperlipidemia, type 2 diabetes mellitus, and more presents the ED with a chief complaint of altered mental status and weakness.  Patient was recently discharged on 10 June.  At that time she had been admitted for altered mental status described as confusion and fatigue with generalized weakness.  She was diagnosed with diverticulitis.    PT Comments    Patient had difficulty sitting up at bedside due to weakness, once seated c/o itching over buttocks and groin area and had to be re-directed to functional task due to scratching self, required verbal cueing ad demonstration for completing BLE exercises while seated at bedside, able to take a few steps a bedside with slow labored movement and limited due to c/o fatigue.  Patient tolerated sitting up in chair with visitor present after therapy - nursing staff notified. Patient will benefit from continued skilled physical therapy in hospital and recommended venue below to increase strength, balance, endurance for safe ADLs and gait.    Recommendations for follow up therapy are one component of a multi-disciplinary discharge planning process, led by the attending physician.  Recommendations may be updated based on patient status, additional functional criteria and insurance authorization.  Follow Up Recommendations  Can patient physically be transported by private vehicle: No    Assistance Recommended at Discharge Frequent or constant Supervision/Assistance  Patient can return home with the following A lot of help with bathing/dressing/bathroom;A lot of help with walking and/or transfers;Help with stairs or ramp for entrance;Assistance with cooking/housework   Equipment  Recommendations  None recommended by PT    Recommendations for Other Services       Precautions / Restrictions Precautions Precautions: Fall Restrictions Weight Bearing Restrictions: No     Mobility  Bed Mobility Overal bed mobility: Needs Assistance Bed Mobility: Supine to Sit     Supine to sit: Mod assist     General bed mobility comments: increased time, labored movement    Transfers Overall transfer level: Needs assistance Equipment used: Rolling walker (2 wheels) Transfers: Sit to/from Stand, Bed to chair/wheelchair/BSC Sit to Stand: Mod assist, Min assist   Step pivot transfers: Mod assist, Min assist       General transfer comment: unsteady labored movement    Ambulation/Gait Ambulation/Gait assistance: Mod assist Gait Distance (Feet): 10 Feet Assistive device: Rolling walker (2 wheels) Gait Pattern/deviations: Decreased step length - right, Decreased stride length Gait velocity: slow     General Gait Details: limited to a few slow labored unsteady side steps, steps forward/backwards before having to sit due to fatigue   Stairs             Wheelchair Mobility    Modified Rankin (Stroke Patients Only)       Balance Overall balance assessment: Needs assistance Sitting-balance support: Feet supported, No upper extremity supported Sitting balance-Leahy Scale: Fair Sitting balance - Comments: fair/good seated at EOB   Standing balance support: Reliant on assistive device for balance, During functional activity, Bilateral upper extremity supported Standing balance-Leahy Scale: Poor Standing balance comment: fair/poor using RW                            Cognition Arousal/Alertness: Awake/alert Behavior During Therapy: WFL for tasks assessed/performed Overall Cognitive Status:  History of cognitive impairments - at baseline                                          Exercises General Exercises - Lower  Extremity Ankle Circles/Pumps: Seated, AROM, Strengthening, Both, 10 reps Long Arc Quad: Seated, AROM, Strengthening, Both, 10 reps Hip Flexion/Marching: Seated, AROM, Strengthening, Both, 10 reps    General Comments        Pertinent Vitals/Pain Pain Assessment Pain Assessment: No/denies pain    Home Living                          Prior Function            PT Goals (current goals can now be found in the care plan section) Acute Rehab PT Goals Patient Stated Goal: return home after rehab Time For Goal Achievement: 10/18/22 Potential to Achieve Goals: Good Progress towards PT goals: Progressing toward goals    Frequency    Min 3X/week      PT Plan Current plan remains appropriate    Co-evaluation              AM-PAC PT "6 Clicks" Mobility   Outcome Measure  Help needed turning from your back to your side while in a flat bed without using bedrails?: A Little Help needed moving from lying on your back to sitting on the side of a flat bed without using bedrails?: A Lot Help needed moving to and from a bed to a chair (including a wheelchair)?: A Lot Help needed standing up from a chair using your arms (e.g., wheelchair or bedside chair)?: A Lot Help needed to walk in hospital room?: A Lot Help needed climbing 3-5 steps with a railing? : Total 6 Click Score: 12    End of Session   Activity Tolerance: Patient tolerated treatment well;Patient limited by fatigue Patient left: in chair;with family/visitor present Nurse Communication: Mobility status PT Visit Diagnosis: Unsteadiness on feet (R26.81);Other abnormalities of gait and mobility (R26.89);Muscle weakness (generalized) (M62.81)     Time: 1610-9604 PT Time Calculation (min) (ACUTE ONLY): 28 min  Charges:  $Therapeutic Exercise: 8-22 mins $Therapeutic Activity: 8-22 mins                     2:43 PM, 09/30/22 Ocie Bob, MPT Physical Therapist with [facility] [address]  [phone] office [phone] mobile phone

## 2022-09-30 NOTE — Progress Notes (Signed)
Transition of Care (TOC) -30 day Note       Patient Details  Name: Rashad Obeid Date of Birth: 1934-10-13   Transition of Care [facility]) CM/SW Contact  Name: Elliot Gault Phone Number: [phone] Date: 09/30/2022 Time: 1116    To Whom it May Concern:   Please be advised that the above patient will require a short-term nursing home stay, anticipated 30 days or less rehabilitation and strengthening. The plan is for return home.

## 2022-10-01 DIAGNOSIS — K219 Gastro-esophageal reflux disease without esophagitis: Secondary | ICD-10-CM | POA: Diagnosis not present

## 2022-10-01 DIAGNOSIS — R4182 Altered mental status, unspecified: Secondary | ICD-10-CM

## 2022-10-01 DIAGNOSIS — E782 Mixed hyperlipidemia: Secondary | ICD-10-CM | POA: Diagnosis not present

## 2022-10-01 DIAGNOSIS — G9341 Metabolic encephalopathy: Secondary | ICD-10-CM | POA: Diagnosis not present

## 2022-10-01 DIAGNOSIS — I482 Chronic atrial fibrillation, unspecified: Secondary | ICD-10-CM | POA: Diagnosis not present

## 2022-10-01 LAB — GLUCOSE, CAPILLARY
Glucose-Capillary: 94 mg/dL (ref 70–99)
Glucose-Capillary: 192 mg/dL — ABNORMAL HIGH (ref 70–99)

## 2022-10-01 LAB — CULTURE, BLOOD (ROUTINE X 2)
Culture: NO GROWTH
Culture: NO GROWTH

## 2022-10-01 MED ORDER — MAGNESIUM OXIDE -MG SUPPLEMENT 400 (240 MG) MG PO TABS: 400.0000 mg | ORAL_TABLET | Freq: Every day | ORAL | 1 refills | Status: DC

## 2022-10-01 MED ORDER — LANTUS SOLOSTAR 100 UNIT/ML ~~LOC~~ SOPN: 12.0000 [IU] | PEN_INJECTOR | Freq: Every day | SUBCUTANEOUS | 1 refills | Status: DC

## 2022-10-01 MED ORDER — QUETIAPINE FUMARATE 25 MG PO TABS: 12.5000 mg | ORAL_TABLET | Freq: Every day | ORAL | 2 refills | Status: DC

## 2022-10-01 MED ORDER — ENSURE ENLIVE PO LIQD: 237.0000 mL | Freq: Two times a day (BID) | ORAL | 12 refills | Status: DC

## 2022-10-01 MED ORDER — METOPROLOL SUCCINATE ER 25 MG PO TB24: 12.5000 mg | ORAL_TABLET | Freq: Every day | ORAL | 2 refills | Status: DC

## 2022-10-01 MED ORDER — CYANOCOBALAMIN 1000 MCG/ML IJ SOLN: INTRAMUSCULAR | 0 refills | Status: DC

## 2022-10-01 MED ORDER — GLIPIZIDE ER 2.5 MG PO TB24: 2.5000 mg | ORAL_TABLET | Freq: Every day | ORAL | Status: DC

## 2022-10-01 NOTE — TOC Transition Note (Signed)
Transition of Care [facility]) - CM/SW Discharge Note   Patient Details  Name: [name] MRN: [id number] Date of Birth: 08-31-1934  Transition of Care [facility]) CM/SW Contact:  Annice Needy, LCSW Phone Number: 10/01/2022, 1:17 PM   Clinical Narrative:    D/c clinicals sent to facility via hub and fax [phone]. Nurse to call report. TOC signing off.    Final next level of care: Skilled Nursing Facility Barriers to Discharge: No Barriers Identified   Patient Goals and CMS Choice   Choice offered to / list presented to : Adult Children  Discharge Placement                Patient chooses bed at: [facility] Patient to be transferred to facility by: [facility] Name of family member notified: sister in law, Delmar Landau Patient and family notified of of transfer: 10/01/22  Discharge Plan and Services Additional resources added to the After Visit Summary for   In-house Referral: Clinical Social Work   Post Acute Care Choice: Skilled Nursing Facility                               Social Determinants of Health (SDOH) Interventions SDOH Screenings   Food Insecurity: No Food Insecurity (09/15/2022)  Recent Concern: Food Insecurity - Food Insecurity Present (06/21/2022)  Housing: Low Risk  (09/15/2022)  Recent Concern: Housing - Medium Risk (06/21/2022)  Transportation Needs: No Transportation Needs (09/15/2022)  Recent Concern: Transportation Needs - Unmet Transportation Needs (06/21/2022)  Utilities: Not At Risk (09/15/2022)  Depression (PHQ2-9): High Risk (09/20/2022)  Tobacco Use: Low Risk  (09/25/2022)     Readmission Risk Interventions     No data to display

## 2022-10-01 NOTE — Discharge Summary (Signed)
Physician Discharge Summary   Patient: [name] MRN: [id number] DOB: Apr 29, 1934  Admit date:     09/26/2022  Discharge date: 10/01/22  Discharge Physician: Vassie Loll   PCP: Rica Records, FNP   Recommendations at discharge:  Repeat basic metabolic panel to follow electrolytes and renal function Reassessed patient's CBGs/A1c with further adjustment to hypoglycemic regimen as needed. Repeat B12 level in 56-month. Outpatient follow-up with a dentist recommended.  Discharge Diagnoses: Principal Problem:   Acute metabolic encephalopathy Active Problems:   Diabetes mellitus with stage 3 chronic kidney disease (HCC)   Primary hypertension   Mixed hyperlipidemia   GERD   Atrial fibrillation, chronic (HCC)   AMS (altered mental status)   Hypomagnesemia  Brief hospital admission narrative: As per H&P written by Dr. Carren Rang on 09/27/22 [name] is a 88 y.o. female with medical history significant of anxiety, asthma, GERD, hypertension, fibromyalgia, hyperlipidemia, type 2 diabetes mellitus, and more presents the ED with a chief complaint of altered mental status and weakness.  Patient was recently discharged on 10 June.  At that time she had been admitted for altered mental status described as confusion and fatigue with generalized weakness.  She was diagnosed with diverticulitis.  She was given antibiotics in the hospital and discharged on p.o. antibiotics.  Family had reported to the ED staff that patient was improved, but 2 days after she finished her antibiotics she became worse again.  There is a note from her hospital follow-up visit on the 14th the reports patient was discharged on Cipro.  She reported feeling fairly well and increased p.o. intake from prior to her hospitalization.  They had no acute concerns to discuss at that appointment.  Today she had a CT of her chest abdomen pelvis that showed resolution of her sigmoid diverticulitis, chronic right  hydronephrosis.  Unfortunately, patient is too fatigued to answer my questions.  It is reported that she was not answering questions appropriately at home, but she can even stay awake long enough to answer my questions.  She is not following commands.  She is protecting her airway.  She does moan in pain and opens her eyes briefly to verbal stimuli.   Assessment and Plan: * Acute metabolic encephalopathy -Excessively fatigued, deconditioned and disoriented at time of admission. -So far workup negative except for mild dehydration and B12 deficiency -Continue to maintain adequate fluid resuscitation, electrolyte repletion and B12 repletion/supplementation -Physical therapy has worked with patient and is recommending skilled nursing facility placement for rehabilitation and conditioning. -Mentation is better and patient more interactive on today's examination; per family member and reports by nursing staff still intermittently confused but is easily to be redirected and is participating in her care and with physical therapy. -Continue minimizing any sedative agents. -Continue daily low-dose Seroquel. -Outpatient evaluation to rule out dementia recommended; at that time if positive treatment with Namenda recommended   Primary hypertension -Continue norvasc, losartan and metoprolol -Vital signs overall stable. -Heart healthy diet discussed with patient.   Diabetes mellitus with stage 3 chronic kidney disease (HCC) -Mild hypoglycemia appreciated at time of admission -Continue adjusted dose of insulin therapy and resume oral hypoglycemic agents -Patient educated about importance of not skipping meals and maintaining adequate hydration   Hypomagnesemia -Continue to follow electrolytes trend and further replete as needed. -Daily supplementation has been initiated.   AMS (altered mental status) -Acute metabolic encephalopathy present at time of admission -Continue supportive care, constant  reorientation, adequate hydration/electrolyte repletion and B12 supplementation. -  Physical therapy has seen patient with recommendations for SNF placement for further rehab and conditioning. -CT head without acute intracranial abnormalities.   Atrial fibrillation, chronic (HCC) -Continue metoprolol and eliquis -Rate controlled and overall condition stable. -Continue outpatient follow-up with cardiology service. -Continue to follow electrolytes trend/stability with goal for potassium above 4 and mag is above 2 as much as possible.   GERD -Continue PPI.   Mixed hyperlipidemia -continue statin -Heart healthy diet discussed with patient and family.  Poor dentition -Outpatient follow-up with a dentist recommended -During evaluation there was no signs of acute abscess or tooth infection.  Consultants: None Procedures performed: See below for x-ray reports. Disposition: Skilled nursing facility Diet recommendation: Heart healthy diet.  DISCHARGE MEDICATION: Allergies as of 10/01/2022       Reactions   Adhesive [tape] Itching   Very sensitive skin, tears easily   Demerol [meperidine] Nausea And Vomiting   Iohexol Nausea And Vomiting   Patient states she almost died from severe n/v    Shellfish Allergy Diarrhea, Nausea And Vomiting   Penicillins Nausea And Vomiting, Rash        Medication List     STOP taking these medications    cyanocobalamin 1000 MCG tablet Commonly known as: VITAMIN B12 Replaced by: cyanocobalamin 1000 MCG/ML injection   metoprolol tartrate 25 MG tablet Commonly known as: LOPRESSOR       TAKE these medications    albuterol 108 (90 Base) MCG/ACT inhaler Commonly known as: VENTOLIN HFA Inhale 2 puffs into the lungs every 6 (six) hours as needed for wheezing or shortness of breath.   amLODipine 5 MG tablet Commonly known as: NORVASC Take 5 mg by mouth daily.   cyanocobalamin 1000 MCG/ML injection Commonly known as: VITAMIN B12 Daily x 3  days; then weekly x 4; then monthly Start taking on: October 02, 2022 Replaces: cyanocobalamin 1000 MCG tablet   Eliquis 2.5 MG Tabs tablet Generic drug: apixaban TAKE 1 TABLET BY MOUTH TWICE DAILY What changed: how much to take   feeding supplement Liqd Take 237 mLs by mouth 2 (two) times daily between meals.   FreeStyle Libre 2 Reader Hardie Pulley As directed   [employer] 2 Sensor Misc 1 Piece by Does not apply route every 14 (fourteen) days.   glipiZIDE 2.5 MG 24 hr tablet Commonly known as: GLUCOTROL XL Take 1 tablet (2.5 mg total) by mouth daily with breakfast. What changed:  medication strength how much to take   Global Ease Inject Pen Needles 31G X 5 MM Misc Generic drug: Insulin Pen Needle USE ONE TIP WITH INSULIN AT BEDTIME   Lantus SoloStar 100 UNIT/ML Solostar Pen Generic drug: insulin glargine Inject 12 Units into the skin at bedtime. What changed: how much to take   losartan 50 MG tablet Commonly known as: COZAAR Take 1 tablet (50 mg total) by mouth daily.   magnesium oxide 400 (240 Mg) MG tablet Commonly known as: MAG-OX Take 1 tablet (400 mg total) by mouth daily. Start taking on: October 02, 2022   metoprolol succinate 25 MG 24 hr tablet Commonly known as: TOPROL-XL Take 0.5 tablets (12.5 mg total) by mouth daily. Start taking on: October 02, 2022   nitroGLYCERIN 0.4 MG SL tablet Commonly known as: NITROSTAT PLACE 1 TABLET UNDER THE TONGUE EVERY 5 MINUTES FOR 3 DOSES AS NEEDED FOR CHEST PAIN. IF YOU HAVE NO RELIEF AFTER THE 3RD DOSE PROCEED TO THE ED FOR AN EVALUATION   ondansetron 8 MG disintegrating tablet Commonly known  as: ZOFRAN-ODT Take 1 tablet (8 mg total) by mouth every 8 (eight) hours as needed for nausea or vomiting.   pantoprazole 40 MG tablet Commonly known as: PROTONIX TAKE 1 TABLET BY MOUTH TWICE DAILY   QUEtiapine 25 MG tablet Commonly known as: SEROQUEL Take 0.5 tablets (12.5 mg total) by mouth at bedtime.   simvastatin 20 MG  tablet Commonly known as: ZOCOR Take 20 mg by mouth daily.   triamcinolone cream 0.1 % Commonly known as: KENALOG Apply 1 Application topically 2 (two) times daily as needed (irritation).               Discharge Care Instructions  (From admission, onward)           Start     Ordered   10/01/22 0000  Discharge wound care:       Comments: Present on admission; stage II coccyx pressure injury without any signs of superimposed infection.  Continue constant repositioning and preventive measures.   10/01/22 1255            Contact information for follow-up providers     [facility], [place], [place]. Schedule an appointment as soon as possible for a visit in 10 day(s).   Specialty: Family Medicine Why: After discharge from the skilled nursing facility. Contact information: [address] [place] [address] [phone]              Contact information for after-discharge care     Destination     [facility] .   Service: Skilled Nursing Contact information: [address] [place] [address] [phone]                    Discharge Exam: Ceasar Mons Weights   09/26/22 1435  Weight: 72.5 kg   General exam: Alert, awake, oriented x 2; Calmer, more interactive and participating with her care. No fever or reported. Poor dentition appreciated. Respiratory system: Clear to auscultation. Respiratory effort normal.  Good saturation on room air. Cardiovascular system:Rate controlled. No rubs or gallops; no JVD. Gastrointestinal system: Abdomen is nondistended, soft and nontender. No organomegaly or masses felt. Normal bowel sounds heard. Central nervous system: Moving 4 limbs spontaneously no focal neurological deficits. Extremities: No cyanosis or clubbing. No edema appreciated. Skin: No petechiae.   Psychiatry: Stable mood.  Insight impaired with concern for underlying cognitive impairment.  Condition at discharge:  Stable.  The results of significant diagnostics from this hospitalization (including imaging, microbiology, ancillary and laboratory) are listed below for reference.   Imaging Studies: CT CHEST ABDOMEN PELVIS WO CONTRAST  Result Date: 09/26/2022 CLINICAL DATA:  Abdominal pain. EXAM: CT CHEST, ABDOMEN AND PELVIS WITHOUT CONTRAST TECHNIQUE: Multidetector CT imaging of the chest, abdomen and pelvis was performed following the standard protocol without IV contrast. RADIATION DOSE REDUCTION: This exam was performed according to the departmental dose-optimization program which includes automated exposure control, adjustment of the mA and/or kV according to patient size and/or use of iterative reconstruction technique. COMPARISON:  CT abdomen pelvis dated 09/15/2022. FINDINGS: Evaluation of this exam is limited in the absence of intravenous contrast as well as streak artifact caused by patient's arms. CT CHEST FINDINGS Cardiovascular: Top-normal cardiac size. No pericardial effusion. Three-vessel coronary vascular calcification. Moderate atherosclerotic calcification of the thoracic aorta. No aneurysmal dilatation. The central pulmonary arteries are grossly unremarkable on this noncontrast CT. Mediastinum/Nodes: No hilar or mediastinal adenopathy. The esophagus is grossly unremarkable. No mediastinal fluid collection. Lungs/Pleura: Small  left pleural effusion. No focal consolidation or pneumothorax. The central airways are patent. Musculoskeletal: Osteopenia with degenerative changes of the spine. No acute osseous pathology. CT ABDOMEN PELVIS FINDINGS No intra-abdominal free air or free fluid. Hepatobiliary: The liver is unremarkable. No biliary dilatation. The gallbladder is unremarkable. Pancreas: Moderate pancreatic atrophy. No active inflammatory changes. No dilatation of the main pancreatic duct. Spleen: Normal in size without focal abnormality. Adrenals/Urinary Tract: The adrenal glands are unremarkable.  Severe right renal parenchyma atrophy with chronic severe right hydronephrosis with transition at the level of the ureteropelvic junction. Punctate focus of parenchymal calcification versus a nonobstructing stone in the interpolar right kidney. There is no hydronephrosis or nephrolithiasis on the left. Subcentimeter faint left renal inferior pole hypodense lesion is not characterized on this noncontrast CT. The urinary bladder is minimally distended and grossly unremarkable. Stomach/Bowel: Interval resolution of the previously seen sigmoid diverticulitis. No collection or abscess. There is sigmoid diverticulosis without active inflammatory changes. There is no bowel obstruction or active inflammation. There is a moderate size anterior pelvic pannus containing loops of small bowel. The appendix is not visualized with certainty. No inflammatory changes identified in the right lower quadrant. Vascular/Lymphatic: Advanced aortoiliac atherosclerotic disease. The IVC is unremarkable. No portal gas. There is no adenopathy. Reproductive: Hysterectomy.  No adnexal masses. Other: None Musculoskeletal: Osteopenia with scoliosis and degenerative changes. Grade 2 L5-S1 anterolisthesis. No acute osseous pathology. IMPRESSION: 1. Small left pleural effusion. No focal consolidation. 2. Interval resolution of the previously seen sigmoid diverticulitis. 3. No bowel obstruction. 4. Chronic severe right hydronephrosis and right renal parenchyma atrophy. 5.  Aortic Atherosclerosis (ICD10-I70.0). Electronically Signed   By: Elgie Collard M.D.   On: 09/26/2022 19:30   CT HEAD WO CONTRAST  Result Date: 09/26/2022 CLINICAL DATA:  Increased confusion, weakness, sleepiness EXAM: CT HEAD WITHOUT CONTRAST TECHNIQUE: Contiguous axial images were obtained from the base of the skull through the vertex without intravenous contrast. RADIATION DOSE REDUCTION: This exam was performed according to the departmental dose-optimization program which  includes automated exposure control, adjustment of the mA and/or kV according to patient size and/or use of iterative reconstruction technique. COMPARISON:  09/15/2022 FINDINGS: Brain: No evidence of acute infarction, hemorrhage, mass, mass effect, or midline shift. No hydrocephalus or extra-axial fluid collection. Vascular: No hyperdense vessel. Skull: Negative for fracture or focal lesion. Hyperostosis frontotemporalis. Prior right craniotomy. Sinuses/Orbits: Mucosal thickening in the right maxillary sinus and sphenoid sinuses. Status post bilateral lens replacements. Other: The mastoid air cells are well aerated. IMPRESSION: No acute intracranial process. Electronically Signed   By: Wiliam Ke M.D.   On: 09/26/2022 16:48   DG Chest Port 1 View  Result Date: 09/26/2022 CLINICAL DATA:  Cough. EXAM: PORTABLE CHEST 1 VIEW COMPARISON:  09/15/2022. FINDINGS: Low lung volumes accentuate the pulmonary vasculature and cardiomediastinal silhouette. Stable mild cardiomegaly. No consolidation or pulmonary edema. No pleural effusion or pneumothorax. IMPRESSION: No evidence of acute cardiopulmonary disease. Electronically Signed   By: Orvan Falconer M.D.   On: 09/26/2022 15:21   CT ABDOMEN PELVIS WO CONTRAST  Result Date: 09/15/2022 CLINICAL DATA:  Left lower quadrant abdominal pain, low-grade temperature. EXAM: CT ABDOMEN AND PELVIS WITHOUT CONTRAST TECHNIQUE: Multidetector CT imaging of the abdomen and pelvis was performed following the standard protocol without IV contrast. RADIATION DOSE REDUCTION: This exam was performed according to the departmental dose-optimization program which includes automated exposure control, adjustment of the mA and/or kV according to patient size and/or use of iterative reconstruction technique. COMPARISON:  11/01/2017.  FINDINGS: Lower chest: Heart is enlarged and coronary artery calcifications are noted. There is a small pericardial effusion. Mild atelectasis and calcifications are  noted in the lower lobes bilaterally. Hepatobiliary: No focal liver abnormality is seen. No gallstones, gallbladder wall thickening, or biliary dilatation. Pancreas: Unremarkable. No pancreatic ductal dilatation or surrounding inflammatory changes. Spleen: Normal in size without focal abnormality. Adrenals/Urinary Tract: The adrenal glands are within normal limits. There is chronic severe hydronephrosis on the right at the level of the UPJ with renal cortical thinning. A nonobstructive right renal calculus is noted. A cyst is present in the lower pole of the left kidney. No obstructive uropathy on the left. The bladder is unremarkable. Stomach/Bowel: Stomach is within normal limits. Appendix is not seen. No bowel obstruction, free air or pneumatosis. A moderate amount of retained stool is present in the colon. Scattered diverticula are present along the colon. There is mild colonic wall thickening with surrounding fat stranding at a diverticulum in the sigmoid colon, compatible with diverticulitis. No abscess is seen. Vascular/Lymphatic: Aortic atherosclerosis. Nonspecific prominent lymph nodes are noted in the periaortic space on the left, not significantly changed from the prior exam. Reproductive: Status post hysterectomy. No adnexal masses. Other: No abdominopelvic ascites. Musculoskeletal: Degenerative changes are present in the thoracolumbar spine. Grade 2 anterolisthesis is noted at L5-S1. No acute or suspicious osseous abnormality. IMPRESSION: 1. Sigmoid diverticulitis.  No abscess or free air is seen. 2. 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 3. Nonobstructive right renal calculus. 4. Aortic atherosclerosis and coronary artery calcifications. Electronically Signed   By: Thornell Sartorius M.D.   On: 09/15/2022 22:44   DG Chest Port 1 View  Result Date: 09/15/2022 CLINICAL DATA:  Altered mental status. EXAM: PORTABLE CHEST 1 VIEW COMPARISON:   01/15/2022 and prior radiographs FINDINGS: Telemetry leads overlie the chest. Cardiomegaly again noted. There is no evidence of focal airspace disease, pulmonary edema, suspicious pulmonary nodule/mass, pleural effusion, or pneumothorax. No acute bony abnormalities are identified. IMPRESSION: Cardiomegaly without evidence of acute cardiopulmonary disease. Electronically Signed   By: Harmon Pier M.D.   On: 09/15/2022 14:13   CT Head Wo Contrast  Result Date: 09/15/2022 CLINICAL DATA:  Altered mental status EXAM: CT HEAD WITHOUT CONTRAST TECHNIQUE: Contiguous axial images were obtained from the base of the skull through the vertex without intravenous contrast. RADIATION DOSE REDUCTION: This exam was performed according to the departmental dose-optimization program which includes automated exposure control, adjustment of the mA and/or kV according to patient size and/or use of iterative reconstruction technique. COMPARISON:  09/11/2017 FINDINGS: Brain: Periventricular white matter and corona radiata hypodensities favor chronic ischemic microvascular white matter disease. Otherwise, the brainstem, cerebellum, cerebral peduncles, thalamus, basal ganglia, basilar cisterns, and ventricular system appear within normal limits. No intracranial hemorrhage, mass lesion, or acute CVA. Vascular: There is atherosclerotic calcification of the cavernous carotid arteries bilaterally. Skull: Right craniotomy.  Hyperostosis frontalis interna. Sinuses/Orbits: Chronic right maxillary and left sphenoid sinusitis. Other: No supplemental non-categorized findings. IMPRESSION: 1. No acute intracranial findings. 2. Periventricular white matter and corona radiata hypodensities favor chronic ischemic microvascular white matter disease. 3. Atherosclerosis. 4. Chronic right maxillary and left sphenoid sinusitis. 5. Right craniotomy. Electronically Signed   By: Gaylyn Rong M.D.   On: 09/15/2022 13:47    Microbiology: Results for orders  placed or performed during the hospital encounter of 09/26/22  Blood culture (routine x 2)     Status: None   Collection Time: 09/26/22  2:54 PM   Specimen: BLOOD RIGHT FOREARM  Result Value Ref Range Status   Specimen Description BLOOD RIGHT FOREARM  Final   Special Requests   Final    BOTTLES DRAWN AEROBIC AND ANAEROBIC Blood Culture results may not be optimal due to an inadequate volume of blood received in culture bottles   Culture   Final    NO GROWTH 5 DAYS Performed at [facility], [address] [place], [place] [address]    Report Status 10/01/2022 FINAL  Final  SARS Coronavirus 2 by RT PCR (hospital order, performed in [facility] hospital lab) *cepheid single result test* Anterior Nasal Swab     Status: None   Collection Time: 09/26/22  3:05 PM   Specimen: Anterior Nasal Swab  Result Value Ref Range Status   SARS Coronavirus 2 by RT PCR NEGATIVE NEGATIVE Final    Comment: (NOTE) SARS-CoV-2 target nucleic acids are NOT DETECTED.  The SARS-CoV-2 RNA is generally detectable in upper and lower respiratory specimens during the acute phase of infection. The lowest concentration of SARS-CoV-2 viral copies this assay can detect is 250 copies / mL. A negative result does not preclude SARS-CoV-2 infection and should not be used as the sole basis for treatment or other patient management decisions.  A negative result may occur with improper specimen collection / handling, submission of specimen other than nasopharyngeal swab, presence of viral mutation(s) within the areas targeted by this assay, and inadequate number of viral copies (<250 copies / mL). A negative result must be combined with clinical observations, patient history, and epidemiological information.  Fact Sheet for Patients:   RoadLapTop.co.za  Fact Sheet for Healthcare Providers: http://kim-miller.com/  This test is not yet approved or  cleared by the [place] FDA  and has been authorized for detection and/or diagnosis of SARS-CoV-2 by FDA under an Emergency Use Authorization (EUA).  This EUA will remain in effect (meaning this test can be used) for the duration of the COVID-19 declaration under Section 564(b)(1) of the Act, 21 U.S.C. section 360bbb-3(b)(1), unless the authorization is terminated or revoked sooner.  Performed at [facility], [address] [place], [place] [address]   Blood culture (routine x 2)     Status: None   Collection Time: 09/26/22  3:05 PM   Specimen: Right Antecubital; Blood  Result Value Ref Range Status   Specimen Description RIGHT ANTECUBITAL  Final   Special Requests   Final    BOTTLES DRAWN AEROBIC AND ANAEROBIC Blood Culture results may not be optimal due to an inadequate volume of blood received in culture bottles   Culture   Final    NO GROWTH 5 DAYS Performed at [facility], [address], [place], [place] [address]    Report Status 10/01/2022 FINAL  Final  Urine Culture     Status: Abnormal   Collection Time: 09/26/22  5:00 PM   Specimen: Urine, Clean Catch  Result Value Ref Range Status   Specimen Description   Final    URINE, CLEAN CATCH Performed at [facility], [address], [place], [place] [address]    Special Requests   Final    NONE Performed at [facility], [address], [place], [place] [address]    Culture MULTIPLE SPECIES PRESENT, SUGGEST RECOLLECTION (A)  Final   Report Status 09/28/2022 FINAL  Final    Labs: CBC: Recent Labs  Lab 09/26/22 1454 09/27/22 0421 09/28/22 0553 09/30/22 0646  WBC 5.4 4.9 6.2 4.4  NEUTROABS  --  3.2  --   --   HGB 13.4 10.1* 10.3* 10.3*  HCT 41.0 31.0* 31.6* 31.2*  MCV 97.2 97.8 96.6 96.0  PLT 242 206 202 210   Basic Metabolic Panel: Recent Labs  Lab 09/26/22 1454 09/27/22 0421 09/28/22 0553 09/30/22 0646  NA 137 138 136 134*  K 4.2 3.9 3.7 3.9  CL 102 107 104 103  CO2 25 25 25 26   GLUCOSE 155* 64* 146* 118*  BUN 18 17 14 15    CREATININE 1.11* 1.10* 1.14* 1.11*  CALCIUM 9.5 8.4* 8.5* 8.6*  MG  --  1.6* 1.6* 1.6*   Liver Function Tests: Recent Labs  Lab 09/26/22 1454 09/27/22 0421  AST 30 17  ALT 21 14  ALKPHOS 63 45  BILITOT 0.7 0.5  PROT 7.7 5.4*  ALBUMIN 3.9 2.7*   CBG: Recent Labs  Lab 09/30/22 1202 09/30/22 1413 09/30/22 1631 09/30/22 1942 10/01/22 0750  GLUCAP 246* 244* 197* 171* 94    Discharge time spent: greater than 30 minutes.  Signed: Vassie Loll, MD [facility] 10/01/2022

## 2022-10-01 NOTE — Care Management Important Message (Signed)
Important Message  Patient Details  Name: [name] MRN: [id number] Date of Birth: 04-26-1934   Medicare Important Message Given:  Yes (spoke with daughter Kathlynn Grate at [phone] to provide info on letter, no additional copy needed)     Corey Harold 10/01/2022, 3:02 PM

## 2022-10-01 NOTE — Progress Notes (Signed)
Patient discharged to [facility],report called and given to  Alvie Heidelberg LPN.Transported by EMS of [facility] to awaiting facility. IV discontinued,catheter intact.

## 2022-10-18 ENCOUNTER — Emergency Department (HOSPITAL_COMMUNITY): Payer: Medicare Other

## 2022-10-18 ENCOUNTER — Encounter (HOSPITAL_COMMUNITY): Payer: Self-pay

## 2022-10-18 ENCOUNTER — Ambulatory Visit: Payer: Medicare Other | Admitting: Family Medicine

## 2022-10-18 ENCOUNTER — Emergency Department (HOSPITAL_COMMUNITY)
Admission: EM | Admit: 2022-10-18 | Discharge: 2022-10-18 | Disposition: A | Discharge status: Home / Self Care | Payer: Medicare Other | Attending: Emergency Medicine | Admitting: Emergency Medicine

## 2022-10-18 DIAGNOSIS — J45909 Unspecified asthma, uncomplicated: Secondary | ICD-10-CM | POA: Insufficient documentation

## 2022-10-18 DIAGNOSIS — Z7984 Long term (current) use of oral hypoglycemic drugs: Secondary | ICD-10-CM | POA: Diagnosis not present

## 2022-10-18 DIAGNOSIS — E860 Dehydration: Secondary | ICD-10-CM | POA: Diagnosis not present

## 2022-10-18 DIAGNOSIS — E119 Type 2 diabetes mellitus without complications: Secondary | ICD-10-CM | POA: Diagnosis not present

## 2022-10-18 DIAGNOSIS — Z7901 Long term (current) use of anticoagulants: Secondary | ICD-10-CM | POA: Diagnosis not present

## 2022-10-18 DIAGNOSIS — Z794 Long term (current) use of insulin: Secondary | ICD-10-CM | POA: Insufficient documentation

## 2022-10-18 DIAGNOSIS — Z79899 Other long term (current) drug therapy: Secondary | ICD-10-CM | POA: Insufficient documentation

## 2022-10-18 DIAGNOSIS — R410 Disorientation, unspecified: Secondary | ICD-10-CM

## 2022-10-18 DIAGNOSIS — N179 Acute kidney failure, unspecified: Secondary | ICD-10-CM | POA: Diagnosis not present

## 2022-10-18 DIAGNOSIS — F0390 Unspecified dementia without behavioral disturbance: Secondary | ICD-10-CM | POA: Diagnosis not present

## 2022-10-18 DIAGNOSIS — I10 Essential (primary) hypertension: Secondary | ICD-10-CM | POA: Diagnosis not present

## 2022-10-18 HISTORY — DX: Unspecified dementia, severe, without behavioral disturbance, psychotic disturbance, mood disturbance, and anxiety: F03.C0

## 2022-10-18 HISTORY — DX: Unspecified intracranial injury with loss of consciousness status unknown, initial encounter: S06.9XAA

## 2022-10-18 LAB — CBC WITH DIFFERENTIAL/PLATELET
Abs Immature Granulocytes: 0.03 10*3/uL (ref 0.00–0.07)
Basophils Absolute: 0 10*3/uL (ref 0.0–0.1)
Basophils Relative: 1 %
Eosinophils Absolute: 0 10*3/uL (ref 0.0–0.5)
Eosinophils Relative: 1 %
HCT: 36.3 % (ref 36.0–46.0)
Hemoglobin: 12.2 g/dL (ref 12.0–15.0)
Immature Granulocytes: 1 %
Lymphocytes Relative: 24 %
Lymphs Abs: 1.1 10*3/uL (ref 0.7–4.0)
MCH: 32.3 pg (ref 26.0–34.0)
MCHC: 33.6 g/dL (ref 30.0–36.0)
MCV: 96 fL (ref 80.0–100.0)
Monocytes Absolute: 0.3 10*3/uL (ref 0.1–1.0)
Monocytes Relative: 6 %
Neutro Abs: 3 10*3/uL (ref 1.7–7.7)
Neutrophils Relative %: 67 %
Platelets: 231 10*3/uL (ref 150–400)
RBC: 3.78 MIL/uL — ABNORMAL LOW (ref 3.87–5.11)
RDW: 13.8 % (ref 11.5–15.5)
WBC: 4.4 10*3/uL (ref 4.0–10.5)
nRBC: 0 % (ref 0.0–0.2)

## 2022-10-18 LAB — COMPREHENSIVE METABOLIC PANEL
ALT: 22 U/L (ref 0–44)
AST: 24 U/L (ref 15–41)
Albumin: 3.9 g/dL (ref 3.5–5.0)
Alkaline Phosphatase: 93 U/L (ref 38–126)
Anion gap: 12 (ref 5–15)
BUN: 34 mg/dL — ABNORMAL HIGH (ref 8–23)
CO2: 22 mmol/L (ref 22–32)
Calcium: 9.9 mg/dL (ref 8.9–10.3)
Chloride: 105 mmol/L (ref 98–111)
Creatinine, Ser: 1.6 mg/dL — ABNORMAL HIGH (ref 0.44–1.00)
GFR, Estimated: 31 mL/min — ABNORMAL LOW (ref >60)
Glucose, Bld: 167 mg/dL — ABNORMAL HIGH (ref 70–99)
Potassium: 4.2 mmol/L (ref 3.5–5.1)
Sodium: 139 mmol/L (ref 135–145)
Total Bilirubin: 1.1 mg/dL (ref 0.3–1.2)
Total Protein: 7.4 g/dL (ref 6.5–8.1)

## 2022-10-18 LAB — URINALYSIS, MICROSCOPIC (REFLEX)

## 2022-10-18 LAB — URINALYSIS, ROUTINE W REFLEX MICROSCOPIC
Bilirubin Urine: NEGATIVE
Glucose, UA: NEGATIVE mg/dL
Ketones, ur: NEGATIVE mg/dL
Nitrite: NEGATIVE
Protein, ur: 100 mg/dL — AB
Specific Gravity, Urine: 1.025 (ref 1.005–1.030)
pH: 6 (ref 5.0–8.0)

## 2022-10-18 MED ORDER — LORAZEPAM 0.5 MG PO TABS
0.5000 mg | ORAL_TABLET | Freq: Once | ORAL | Status: DC
  Filled 2022-10-18: qty 1

## 2022-10-18 MED ORDER — LORAZEPAM 2 MG/ML IJ SOLN
0.5000 mg | Freq: Once | INTRAMUSCULAR | Status: AC
  Administered 2022-10-18: 0.5 mg via INTRAVENOUS
  Filled 2022-10-18: qty 1

## 2022-10-18 MED ORDER — SODIUM CHLORIDE 0.9 % IV SOLN
1.0000 g | Freq: Once | INTRAVENOUS | Status: AC
  Administered 2022-10-18: 1 g via INTRAVENOUS
  Filled 2022-10-18: qty 10

## 2022-10-18 MED ORDER — SODIUM CHLORIDE 0.9 % IV BOLUS
1000.0000 mL | Freq: Once | INTRAVENOUS | Status: AC
  Administered 2022-10-18: 1000 mL via INTRAVENOUS

## 2022-10-18 MED ORDER — QUETIAPINE FUMARATE 25 MG PO TABS
12.5000 mg | ORAL_TABLET | Freq: Every day | ORAL | Status: DC
  Administered 2022-10-18: 12.5 mg via ORAL
  Filled 2022-10-18: qty 1

## 2022-10-18 NOTE — ED Provider Notes (Signed)
[place] EMERGENCY DEPARTMENT AT [facility] Provider Note   CSN: [id number] Arrival date & time: 10/18/22  0001     History  Chief Complaint  Patient presents with   Combative/Confusion     [name] is a 88 y.o. female.  HPI     This is an 88 year old female who presents from [facility] with concern for increased agitation and confusion.  She reportedly is refusing to take her medications.  She is currently being treated for urinary tract infection.  Patient does not provide any history.  She is talking about people being dead and killed by dogs.  She will not answer directed questions.  Level 5 caveat  Chart reviewed.  Admitted for encephalopathy in early June.  There are some notes that expressed concern for underlying dementia. Home Medications Prior to Admission medications   Medication Sig Start Date End Date Taking? Authorizing Provider  albuterol (VENTOLIN HFA) 108 (90 Base) MCG/ACT inhaler Inhale 2 puffs into the lungs every 6 (six) hours as needed for wheezing or shortness of breath. 05/22/22   Del Nigel Berthold, FNP  amLODipine (NORVASC) 5 MG tablet Take 5 mg by mouth daily.    [provider]  apixaban (ELIQUIS) 2.5 MG TABS tablet TAKE 1 TABLET BY MOUTH TWICE DAILY Patient taking differently: Take 2.5 mg by mouth 2 (two) times daily. 06/20/22   Jonelle Sidle, MD  Continuous Blood Gluc Receiver (FREESTYLE LIBRE 2 READER) DEVI As directed 01/22/22   Roma Kayser, MD  Continuous Blood Gluc Sensor (FREESTYLE LIBRE 2 SENSOR) MISC 1 Piece by Does not apply route every 14 (fourteen) days. 11/22/21   Roma Kayser, MD  cyanocobalamin (VITAMIN B12) 1000 MCG/ML injection Daily x 3 days; then weekly x 4; then monthly 10/02/22   Vassie Loll, MD  feeding supplement (ENSURE ENLIVE / ENSURE PLUS) LIQD Take 237 mLs by mouth 2 (two) times daily between meals. 10/01/22   Vassie Loll, MD  glipiZIDE (GLUCOTROL XL) 2.5 MG 24 hr  tablet Take 1 tablet (2.5 mg total) by mouth daily with breakfast. 10/01/22   Vassie Loll, MD  GLOBAL EASE INJECT PEN NEEDLES 31G X 5 MM MISC USE ONE TIP WITH INSULIN AT BEDTIME 07/10/20   Roma Kayser, MD  insulin glargine (LANTUS SOLOSTAR) 100 UNIT/ML Solostar Pen Inject 12 Units into the skin at bedtime. 10/01/22   Vassie Loll, MD  losartan (COZAAR) 50 MG tablet Take 1 tablet (50 mg total) by mouth daily. 09/17/22   Vassie Loll, MD  magnesium oxide (MAG-OX) 400 (240 Mg) MG tablet Take 1 tablet (400 mg total) by mouth daily. 10/02/22   Vassie Loll, MD  metoprolol succinate (TOPROL-XL) 25 MG 24 hr tablet Take 0.5 tablets (12.5 mg total) by mouth daily. 10/02/22   Vassie Loll, MD  nitroGLYCERIN (NITROSTAT) 0.4 MG SL tablet PLACE 1 TABLET UNDER THE TONGUE EVERY 5 MINUTES FOR 3 DOSES AS NEEDED FOR CHEST PAIN. IF YOU HAVE NO RELIEF AFTER THE 3RD DOSE PROCEED TO THE ED FOR AN EVALUATION 06/15/19   Jonelle Sidle, MD  ondansetron (ZOFRAN-ODT) 8 MG disintegrating tablet Take 1 tablet (8 mg total) by mouth every 8 (eight) hours as needed for nausea or vomiting. 09/16/22   Vassie Loll, MD  pantoprazole (PROTONIX) 40 MG tablet TAKE 1 TABLET BY MOUTH TWICE DAILY Patient taking differently: Take 40 mg by mouth 2 (two) times daily. 08/21/22   Corbin Ade, MD  QUEtiapine (SEROQUEL) 25 MG tablet  Take 0.5 tablets (12.5 mg total) by mouth at bedtime. 10/01/22   Vassie Loll, MD  simvastatin (ZOCOR) 20 MG tablet Take 20 mg by mouth daily.  08/03/13   [provider]  triamcinolone cream (KENALOG) 0.1 % Apply 1 Application topically 2 (two) times daily as needed (irritation). 09/16/22   Vassie Loll, MD      Allergies    Adhesive [tape], Demerol [meperidine], Iohexol, Shellfish allergy, and Penicillins    Review of Systems   Review of Systems  Unable to perform ROS: Mental status change    Physical Exam Updated Vital Signs BP 131/76   Pulse 89   Temp 98 F (36.7 C)   Resp  16   Ht 1.6 m (5\' 3" )   Wt 72.5 kg   SpO2 97%   BMI 28.31 kg/m  Physical Exam Vitals and nursing note reviewed.  Constitutional:      Appearance: She is well-developed. She is obese. She is not ill-appearing.  HENT:     Head: Normocephalic and atraumatic.  Eyes:     Pupils: Pupils are equal, round, and reactive to light.  Cardiovascular:     Rate and Rhythm: Normal rate and regular rhythm.     Heart sounds: Normal heart sounds.  Pulmonary:     Effort: Pulmonary effort is normal. No respiratory distress.     Breath sounds: No wheezing.  Abdominal:     Palpations: Abdomen is soft.     Tenderness: There is no abdominal tenderness.  Musculoskeletal:     Cervical back: Neck supple.  Skin:    General: Skin is warm and dry.  Neurological:     Mental Status: She is alert.     Comments: Disoriented, will not answer direct questions  Psychiatric:     Comments: Currently cooperative     ED Results / Procedures / Treatments   Labs (all labs ordered are listed, but only abnormal results are displayed) Labs Reviewed  CBC WITH DIFFERENTIAL/PLATELET - Abnormal; Notable for the following components:      Result Value   RBC 3.78 (*)    All other components within normal limits  COMPREHENSIVE METABOLIC PANEL - Abnormal; Notable for the following components:   Glucose, Bld 167 (*)    BUN 34 (*)    Creatinine, Ser 1.60 (*)    GFR, Estimated 31 (*)    All other components within normal limits  URINALYSIS, ROUTINE W REFLEX MICROSCOPIC - Abnormal; Notable for the following components:   Hgb urine dipstick LARGE (*)    Protein, ur 100 (*)    Leukocytes,Ua SMALL (*)    All other components within normal limits  URINALYSIS, MICROSCOPIC (REFLEX) - Abnormal; Notable for the following components:   Bacteria, UA RARE (*)    All other components within normal limits  URINE CULTURE    EKG EKG Interpretation Date/Time:  Friday October 18 2022 00:21:59 EDT Ventricular Rate:  82 PR  Interval:    QRS Duration:  84 QT Interval:  352 QTC Calculation: 406 R Axis:   26  Text Interpretation: Atrial fibrillation Ventricular premature complex Low voltage, precordial leads Probable anteroseptal infarct, old Confirmed by Ross Marcus ([id number]) on 10/18/2022 3:50:45 AM  Radiology CT Head Wo Contrast  Result Date: 10/18/2022 CLINICAL DATA:  Combative, altered mental status EXAM: CT HEAD WITHOUT CONTRAST TECHNIQUE: Contiguous axial images were obtained from the base of the skull through the vertex without intravenous contrast. RADIATION DOSE REDUCTION: This exam was performed according to the  departmental dose-optimization program which includes automated exposure control, adjustment of the mA and/or kV according to patient size and/or use of iterative reconstruction technique. COMPARISON:  10/04/2022 FINDINGS: Brain: No evidence of acute infarction, hemorrhage, mass, mass effect, or midline shift. No hydrocephalus or extra-axial fluid collection. Vascular: No hyperdense vessel. Skull: Negative for fracture or focal lesion. Prior right craniotomy. Hyperostosis frontotemporalis. Sinuses/Orbits: Mucosal thickening in the right maxillary sinus. Minimal mucosal thickening in the right sphenoid sinus. Status post bilateral lens replacements. Other: The mastoid air cells are well aerated. Debris in the bilateral external auditory canals, likely cerumen. IMPRESSION: No acute intracranial process. Electronically Signed   By: Wiliam Ke M.D.   On: 10/18/2022 01:25    Procedures Procedures    Medications Ordered in ED Medications  QUEtiapine (SEROQUEL) tablet 12.5 mg (12.5 mg Oral Given 10/18/22 0145)  LORazepam (ATIVAN) injection 0.5 mg (0.5 mg Intravenous Given 10/18/22 0052)  sodium chloride 0.9 % bolus 1,000 mL (0 mLs Intravenous Stopped 10/18/22 0322)  cefTRIAXone (ROCEPHIN) 1 g in sodium chloride 0.9 % 100 mL IVPB (0 g Intravenous Stopped 10/18/22 0244)    ED Course/ Medical Decision  Making/ A&P Clinical Course as of 10/18/22 0348  Fri Oct 18, 2022  0347 Spoke to patient's daughter.  Reports increased delirium and hallucinations since hospitalization and illness in early June.  That is not new.  No documented or formal diagnosis of dementia but does state that she has not returned back to her baseline since her hospitalization in early June.  Discussed with her my suspicions that her illnesses may have unmasked some underlying dementia.  Also being in a living facility likely has caused some delirium. [CH]    Clinical Course User Index [CH] Aamiyah Derrick, Mayer Masker, MD                             Medical Decision Making Amount and/or Complexity of Data Reviewed Labs: ordered. Radiology: ordered.  Risk Prescription drug management.   This patient presents to the ED for concern of agitation, confusion, this involves an extensive number of treatment options, and is a complaint that carries with it a high risk of complications and morbidity.  I considered the following differential and admission for this acute, potentially life threatening condition.  The differential diagnosis includes delirium, dementia, encephalopathy, acute infection  MDM:    This is a 88 year old female who presents with confusion and increasing agitation.  Refusing to take medications per report.  Nontoxic.  Vital signs are reassuring.  She is afebrile.  She does not provide any useful information.  She appears delirious.  Labs obtained and reviewed.  No significant leukocytosis.  She does have a slightly elevated BUN to 34 and a creatinine of 1.6 up from a baseline of around 1.1.  This could reflect some mild dehydration.  Uremia could also contribute to her altered mental status.  She was given some fluids.  Urinalysis is not necessarily consistent with UTI although if she has received antibiotics previously this could skew this result.  Urine culture was sent and she was given IV Rocephin given treatment at  the facility and refusal of medications.  Patient was given a small dose of Ativan which she is receiving as needed as well as her nightly Seroquel.  See discussion with daughter above.  Highly suspect that her refusal for meds is related to delirium.  She likely also has some underlying dementia.  Aside from  mild dehydration with a little bit of uremia, there are no obvious other issues.  Will return back to facility.  (Labs, imaging, consults)  Labs: I Ordered, and personally interpreted labs.  The pertinent results include: CBC, BMP, urinalysis, urine culture  Imaging Studies ordered: I ordered imaging studies including CT head I independently visualized and interpreted imaging. I agree with the radiologist interpretation  Additional history obtained from EMS, daughter.  External records from outside source obtained and reviewed including recent discharge summary  Cardiac Monitoring: The patient was maintained on a cardiac monitor.  If on the cardiac monitor, I personally viewed and interpreted the cardiac monitored which showed an underlying rhythm of: Atrial fibrillation  Reevaluation: After the interventions noted above, I reevaluated the patient and found that they have :stayed the same  Social Determinants of Health:  lives at [facility]  Disposition: Discharge  Co morbidities that complicate the patient evaluation  Past Medical History:  Diagnosis Date   Advanced dementia (HCC)    2020   Anxiety    Asthma    Brain injury (HCC)    2019   Chronic back pain    Colonoscopy causing post-procedural bleeding    Incomplete. by Dr. Jena Gauss 06/11/99 due to fixed non-compliant colon precluded exam to 40 cm, she had subsequent colectomy for diverticulitis since that time.    Diarrhea    Esophageal reflux    Essential hypertension    Fibromyalgia    Hiatal hernia    Recent EGD/TCS showed small hiatal hernia, normal apperaring tubular esophagus. s/p passage of a 54-french Maloney  dilator, s/p biopsy esophageal mucosa, multiple fundal gland type gastric polyps. one large pedunculated polp with oozing, noted s/p clipping and snare polypectomy. Biopsies of the gastric mucosa taken given her symptoms in her elevated count. normal D1-D3, s/p biospy D2-D3.    Hiatal hernia    all bx unremarkable. on TCS she had left-sided diverticula, evidence of prior segmental resection with anastomosis, multiple colonic polyps s/p multiple snare polypectomies, s/p segmental biopsy and stool sampling. she had multiple tubular adenomas. no microscopic/collagenous colitis. stool culture, c diff, O+P, lactoferrin were negative.    Mixed hyperlipidemia    Nephrolithiasis    OSA (obstructive sleep apnea)    Permanent atrial fibrillation (HCC)    Previous failed DCCV   Type 2 diabetes mellitus (HCC)    Ureteral stent retained    Not retained. ureteral stent removal gross hematuria resolved 10/11. coumadin restarted.    Ventral hernia      Medicines Meds ordered this encounter  Medications   DISCONTD: LORazepam (ATIVAN) tablet 0.5 mg   LORazepam (ATIVAN) injection 0.5 mg   sodium chloride 0.9 % bolus 1,000 mL   cefTRIAXone (ROCEPHIN) 1 g in sodium chloride 0.9 % 100 mL IVPB    Order Specific Question:   Antibiotic Indication:    Answer:   UTI   QUEtiapine (SEROQUEL) tablet 12.5 mg    I have reviewed the patients home medicines and have made adjustments as needed  Problem List / ED Course: Problem List Items Addressed This Visit       Other   AMS (altered mental status)   Other Visit Diagnoses     Dehydration    -  Primary   AKI (acute kidney injury) (HCC)                       Final Clinical Impression(s) / ED Diagnoses Final diagnoses:  Dehydration  AKI (  acute kidney injury) [facility])  Delirium    Rx / DC Orders ED Discharge Orders     None         Allye Hoyos, Mayer Masker, MD 10/18/22 [phone]

## 2022-10-18 NOTE — ED Notes (Signed)
Safety mittens applied to bilateral hands as pt is attempting to pull at monitoring cords and IV, pt unable to be redirected

## 2022-10-18 NOTE — Discharge Instructions (Signed)
You were seen today for increased agitation and confusion.  You do appear slightly dehydrated.  Make sure that you are drinking plenty of fluids.  Have your creatinine rechecked in 1 week.

## 2022-10-18 NOTE — ED Triage Notes (Addendum)
Pt arrived from [facility] BIB [facility] for combativeness, pt was dx with a UTI, but facility has not been able to give pt her ABX or ativan for the past few days as pt is combative and refusing meds, but pt has been abel to take some of her regular daily medications. Per EMS, staff reported "we don't want her to come back until her UTI is treated". EMS was not able to get full set of vitals, pt does have hx of AMS. Pt refusing vitals, screaming, and using foul language.   Pt has hx of advance dementia and a brain injury in 2019

## 2022-10-18 NOTE — ED Notes (Signed)
Pt transported to CT scanner.

## 2022-10-21 LAB — URINE CULTURE: Culture: >=100000 — AB

## 2022-10-22 ENCOUNTER — Telehealth (HOSPITAL_BASED_OUTPATIENT_CLINIC_OR_DEPARTMENT_OTHER): Payer: Self-pay | Admitting: *Deleted

## 2022-10-22 NOTE — Telephone Encounter (Signed)
Post ED Visit - Positive Culture Follow-up: Unsuccessful Patient Follow-up  Culture assessed and recommendations reviewed by:  [x]   [name], Pharm.D. []  Celedonio Miyamoto, Pharm.D., BCPS AQ-ID []  Garvin Fila, Pharm.D., BCPS []  Georgina Pillion, [address].D., BCPS []  [place], [address].D., BCPS, AAHIVP []  Estella Husk, Pharm.D., BCPS, AAHIVP []  Sherlynn Carbon, PharmD []  Pollyann Samples, PharmD, BCPS  Positive urine culture  [x]  Patient discharged without antimicrobial prescription and treatment is now indicated []  Organism is resistant to prescribed ED discharge antimicrobial []  Patient with positive blood cultures  Plan:  Fosfomycin 3g PO x 1 dose, Estanislado Pandy, DO  Unable to contact patient after 3 attempts, letter will be sent to address on file  Jamelle, Noy 10/22/2022, 8:40 AM

## 2022-10-22 NOTE — Progress Notes (Signed)
ED Antimicrobial Stewardship Positive Culture Follow Up   [name] is an 88 y.o. female who presented to [facility] on 10/18/2022 with a chief complaint of  Chief Complaint  Patient presents with   Combative/Confusion     Recent Results (from the past 720 hour(s))  Blood culture (routine x 2)     Status: None   Collection Time: 09/26/22  2:54 PM   Specimen: BLOOD RIGHT FOREARM  Result Value Ref Range Status   Specimen Description BLOOD RIGHT FOREARM  Final   Special Requests   Final    BOTTLES DRAWN AEROBIC AND ANAEROBIC Blood Culture results may not be optimal due to an inadequate volume of blood received in culture bottles   Culture   Final    NO GROWTH 5 DAYS Performed at [facility], [address], [place], [place] [address]    Report Status 10/01/2022 FINAL  Final  SARS Coronavirus 2 by RT PCR (hospital order, performed in [facility] hospital lab) *cepheid single result test* Anterior Nasal Swab     Status: None   Collection Time: 09/26/22  3:05 PM   Specimen: Anterior Nasal Swab  Result Value Ref Range Status   SARS Coronavirus 2 by RT PCR NEGATIVE NEGATIVE Final    Comment: (NOTE) SARS-CoV-2 target nucleic acids are NOT DETECTED.  The SARS-CoV-2 RNA is generally detectable in upper and lower respiratory specimens during the acute phase of infection. The lowest concentration of SARS-CoV-2 viral copies this assay can detect is 250 copies / mL. A negative result does not preclude SARS-CoV-2 infection and should not be used as the sole basis for treatment or other patient management decisions.  A negative result may occur with improper specimen collection / handling, submission of specimen other than nasopharyngeal swab, presence of viral mutation(s) within the areas targeted by this assay, and inadequate number of viral copies (<250 copies / mL). A negative result must be combined with clinical observations, patient history, and epidemiological  information.  Fact Sheet for Patients:   RoadLapTop.co.za  Fact Sheet for Healthcare Providers: http://kim-miller.com/  This test is not yet approved or  cleared by the [place] FDA and has been authorized for detection and/or diagnosis of SARS-CoV-2 by FDA under an Emergency Use Authorization (EUA).  This EUA will remain in effect (meaning this test can be used) for the duration of the COVID-19 declaration under Section 564(b)(1) of the Act, 21 U.S.C. section 360bbb-3(b)(1), unless the authorization is terminated or revoked sooner.  Performed at [facility], [address], [place], [place] [address]   Blood culture (routine x 2)     Status: None   Collection Time: 09/26/22  3:05 PM   Specimen: Right Antecubital; Blood  Result Value Ref Range Status   Specimen Description RIGHT ANTECUBITAL  Final   Special Requests   Final    BOTTLES DRAWN AEROBIC AND ANAEROBIC Blood Culture results may not be optimal due to an inadequate volume of blood received in culture bottles   Culture   Final    NO GROWTH 5 DAYS Performed at [facility], [address], [place], [place] [address]    Report Status 10/01/2022 FINAL  Final  Urine Culture     Status: Abnormal   Collection Time: 09/26/22  5:00 PM   Specimen: Urine, Clean Catch  Result Value Ref Range Status   Specimen Description   Final    URINE, CLEAN CATCH Performed at [facility], [address], [place], [place] [address]  Special Requests   Final    NONE Performed at [facility], [address], [place], [place] [address]    Culture MULTIPLE SPECIES PRESENT, SUGGEST RECOLLECTION (A)  Final   Report Status 09/28/2022 FINAL  Final  Urine Culture     Status: Abnormal   Collection Time: 10/18/22  1:56 AM   Specimen: Urine, Catheterized  Result Value Ref Range Status   Specimen Description   Final    URINE, CATHETERIZED Performed at [facility], [address], [place],  [place] [address]    Special Requests   Final    NONE Performed at [facility], [address], [place], [place] [address]    Culture (A)  Final    >=100,000 COLONIES/mL ENTEROCOCCUS FAECALIS 80,000 COLONIES/mL ENTEROCOCCUS FAECIUM    Report Status 10/21/2022 FINAL  Final   Organism ID, Bacteria ENTEROCOCCUS FAECALIS (A)  Final   Organism ID, Bacteria ENTEROCOCCUS FAECIUM (A)  Final      Susceptibility   Enterococcus faecalis - MIC*    AMPICILLIN <=2 SENSITIVE Sensitive     NITROFURANTOIN <=16 SENSITIVE Sensitive     VANCOMYCIN 1 SENSITIVE Sensitive     * >=100,000 COLONIES/mL ENTEROCOCCUS FAECALIS   Enterococcus faecium - MIC*    AMPICILLIN >=32 RESISTANT Resistant     NITROFURANTOIN 256 RESISTANT Resistant     VANCOMYCIN <=0.5 SENSITIVE Sensitive     * 80,000 COLONIES/mL ENTEROCOCCUS FAECIUM    [x]  Treated with ceftriaxone, organism resistant to prescribed antimicrobial []  Patient discharged originally without antimicrobial agent and treatment is now indicated  New antibiotic prescription: Fosfomycin  ED Provider: Estanislado Pandy, DO   [name] 10/22/2022, 8:29 AM Clinical Pharmacist Monday - Friday phone -  [phone] Saturday - Sunday phone - [phone]

## 2022-11-11 ENCOUNTER — Ambulatory Visit: Payer: Medicare Other | Admitting: Cardiology

## 2022-11-22 MED ORDER — FLUCONAZOLE 150 MG PO TABS: 150.0000 mg | ORAL_TABLET | Freq: Once | ORAL | 0 refills | Status: AC

## 2022-11-22 NOTE — Telephone Encounter (Signed)
Spoke to daughter states its been a while of this its no longer needed.

## 2022-11-22 NOTE — Addendum Note (Signed)
Addended by: Christel Mormon E on: 11/22/2022 12:09 PM   Modules accepted: Orders

## 2022-12-08 DEATH — deceased

## 2022-12-31 ENCOUNTER — Encounter: Payer: Self-pay | Admitting: Neurology

## 2022-12-31 ENCOUNTER — Ambulatory Visit: Payer: Self-pay | Admitting: Neurology

## 2023-01-01 ENCOUNTER — Telehealth: Payer: Self-pay | Admitting: Neurology

## 2023-01-01 NOTE — Telephone Encounter (Signed)
Pt passed away 12/30/2022.
